# Patient Record
Sex: Female | Born: 1950 | Race: White | Hispanic: No | Marital: Married | Smoking: Never smoker
Health system: Southern US, Community
[De-identification: ages and names within clinical notes are randomized; demographics above are authoritative.]

## PROBLEM LIST (undated history)

## (undated) DIAGNOSIS — I2510 Atherosclerotic heart disease of native coronary artery without angina pectoris: Secondary | ICD-10-CM

## (undated) DIAGNOSIS — Z8673 Personal history of transient ischemic attack (TIA), and cerebral infarction without residual deficits: Secondary | ICD-10-CM

## (undated) DIAGNOSIS — Z9289 Personal history of other medical treatment: Secondary | ICD-10-CM

## (undated) DIAGNOSIS — I10 Essential (primary) hypertension: Secondary | ICD-10-CM

## (undated) DIAGNOSIS — I69359 Hemiplegia and hemiparesis following cerebral infarction affecting unspecified side: Secondary | ICD-10-CM

## (undated) DIAGNOSIS — E119 Type 2 diabetes mellitus without complications: Secondary | ICD-10-CM

## (undated) DIAGNOSIS — F32A Depression, unspecified: Secondary | ICD-10-CM

## (undated) DIAGNOSIS — M797 Fibromyalgia: Secondary | ICD-10-CM

## (undated) DIAGNOSIS — K589 Irritable bowel syndrome without diarrhea: Secondary | ICD-10-CM

## (undated) DIAGNOSIS — I69398 Other sequelae of cerebral infarction: Principal | ICD-10-CM

## (undated) DIAGNOSIS — M1990 Unspecified osteoarthritis, unspecified site: Secondary | ICD-10-CM

## (undated) DIAGNOSIS — F329 Major depressive disorder, single episode, unspecified: Secondary | ICD-10-CM

## (undated) DIAGNOSIS — E785 Hyperlipidemia, unspecified: Secondary | ICD-10-CM

## (undated) DIAGNOSIS — F419 Anxiety disorder, unspecified: Secondary | ICD-10-CM

## (undated) HISTORY — DX: Personal history of other medical treatment: Z92.89

## (undated) HISTORY — PX: OTHER SURGICAL HISTORY: SHX169

## (undated) HISTORY — PX: CORONARY STENT PLACEMENT: SHX1402

## (undated) HISTORY — DX: Depression, unspecified: F32.A

## (undated) HISTORY — PX: CHOLECYSTECTOMY: SHX55

## (undated) HISTORY — DX: Anxiety disorder, unspecified: F41.9

## (undated) HISTORY — DX: Hyperlipidemia, unspecified: E78.5

## (undated) HISTORY — DX: Hemiplegia and hemiparesis following cerebral infarction affecting unspecified side: I69.359

## (undated) HISTORY — DX: Personal history of transient ischemic attack (TIA), and cerebral infarction without residual deficits: Z86.73

## (undated) HISTORY — DX: Other sequelae of cerebral infarction: I69.398

## (undated) HISTORY — DX: Major depressive disorder, single episode, unspecified: F32.9

---

## 1993-11-09 DIAGNOSIS — M797 Fibromyalgia: Secondary | ICD-10-CM

## 1993-11-09 HISTORY — DX: Fibromyalgia: M79.7

## 1998-01-14 ENCOUNTER — Ambulatory Visit (HOSPITAL_COMMUNITY): Admission: RE | Admit: 1998-01-14 | Discharge: 1998-01-14 | Payer: Self-pay | Admitting: *Deleted

## 1999-01-06 ENCOUNTER — Other Ambulatory Visit: Admission: RE | Admit: 1999-01-06 | Discharge: 1999-01-06 | Payer: Self-pay | Admitting: *Deleted

## 2000-05-13 ENCOUNTER — Other Ambulatory Visit: Admission: RE | Admit: 2000-05-13 | Discharge: 2000-05-13 | Payer: Self-pay | Admitting: Obstetrics

## 2000-05-13 ENCOUNTER — Encounter: Admission: RE | Admit: 2000-05-13 | Discharge: 2000-05-13 | Payer: Self-pay | Admitting: Obstetrics

## 2001-12-12 ENCOUNTER — Encounter: Payer: Self-pay | Admitting: Cardiovascular Disease

## 2001-12-12 ENCOUNTER — Ambulatory Visit (HOSPITAL_COMMUNITY): Admission: RE | Admit: 2001-12-12 | Discharge: 2001-12-12 | Payer: Self-pay | Admitting: Cardiovascular Disease

## 2003-08-27 ENCOUNTER — Encounter: Admission: RE | Admit: 2003-08-27 | Discharge: 2003-11-25 | Payer: Self-pay | Admitting: Family Medicine

## 2005-06-17 ENCOUNTER — Encounter: Admission: RE | Admit: 2005-06-17 | Discharge: 2005-06-17 | Payer: Self-pay | Admitting: Internal Medicine

## 2007-06-02 ENCOUNTER — Emergency Department (HOSPITAL_COMMUNITY): Admission: EM | Admit: 2007-06-02 | Discharge: 2007-06-03 | Payer: Self-pay | Admitting: Emergency Medicine

## 2007-07-26 ENCOUNTER — Ambulatory Visit: Payer: Self-pay | Admitting: *Deleted

## 2007-07-26 ENCOUNTER — Ambulatory Visit: Payer: Self-pay | Admitting: Family Medicine

## 2007-07-26 LAB — CONVERTED CEMR LAB
ALT: 22 units/L (ref 0–35)
AST: 17 units/L (ref 0–37)
Albumin: 4.1 g/dL (ref 3.5–5.2)
BUN: 12 mg/dL (ref 6–23)
Basophils Absolute: 0 10*3/uL (ref 0.0–0.1)
Basophils Relative: 0 % (ref 0–1)
CO2: 29 meq/L (ref 19–32)
Calcium: 9.4 mg/dL (ref 8.4–10.5)
Chloride: 96 meq/L (ref 96–112)
Lymphocytes Relative: 25 % (ref 12–46)
MCHC: 33.1 g/dL (ref 30.0–36.0)
Monocytes Relative: 8 % (ref 3–11)
Neutro Abs: 7 10*3/uL (ref 1.7–7.7)
Neutrophils Relative %: 64 % (ref 43–77)
Potassium: 3.3 meq/L — ABNORMAL LOW (ref 3.5–5.3)
RBC: 4.63 M/uL (ref 3.87–5.11)

## 2007-08-09 ENCOUNTER — Ambulatory Visit (HOSPITAL_COMMUNITY): Admission: RE | Admit: 2007-08-09 | Discharge: 2007-08-09 | Payer: Self-pay | Admitting: Family Medicine

## 2007-11-18 ENCOUNTER — Ambulatory Visit: Payer: Self-pay | Admitting: Family Medicine

## 2007-12-13 ENCOUNTER — Ambulatory Visit: Payer: Self-pay | Admitting: Family Medicine

## 2007-12-13 LAB — CONVERTED CEMR LAB
BUN: 12 mg/dL (ref 6–23)
Calcium: 9.7 mg/dL (ref 8.4–10.5)
Glucose, Bld: 188 mg/dL — ABNORMAL HIGH (ref 70–99)
TSH: 5.88 microintl units/mL — ABNORMAL HIGH (ref 0.350–5.50)

## 2008-02-03 ENCOUNTER — Ambulatory Visit: Payer: Self-pay | Admitting: Internal Medicine

## 2008-02-03 ENCOUNTER — Encounter (INDEPENDENT_AMBULATORY_CARE_PROVIDER_SITE_OTHER): Payer: Self-pay | Admitting: Family Medicine

## 2008-02-03 LAB — CONVERTED CEMR LAB
BUN: 10 mg/dL (ref 6–23)
Calcium: 8.9 mg/dL (ref 8.4–10.5)
Cholesterol: 205 mg/dL — ABNORMAL HIGH (ref 0–200)
Creatinine, Ser: 0.79 mg/dL (ref 0.40–1.20)
Triglycerides: 174 mg/dL — ABNORMAL HIGH (ref <150)

## 2008-07-04 ENCOUNTER — Ambulatory Visit: Payer: Self-pay | Admitting: Family Medicine

## 2008-07-04 LAB — CONVERTED CEMR LAB
BUN: 12 mg/dL (ref 6–23)
CO2: 28 meq/L (ref 19–32)
Chloride: 96 meq/L (ref 96–112)
Creatinine, Ser: 0.84 mg/dL (ref 0.40–1.20)

## 2008-07-13 ENCOUNTER — Ambulatory Visit: Payer: Self-pay | Admitting: Family Medicine

## 2008-07-13 LAB — CONVERTED CEMR LAB
ALT: 29 units/L (ref 0–35)
BUN: 12 mg/dL (ref 6–23)
CO2: 26 meq/L (ref 19–32)
Calcium: 9.2 mg/dL (ref 8.4–10.5)
Chloride: 97 meq/L (ref 96–112)
Cholesterol: 243 mg/dL — ABNORMAL HIGH (ref 0–200)
Creatinine, Ser: 0.84 mg/dL (ref 0.40–1.20)
Total CHOL/HDL Ratio: 4.5

## 2008-07-23 ENCOUNTER — Ambulatory Visit (HOSPITAL_COMMUNITY): Admission: RE | Admit: 2008-07-23 | Discharge: 2008-07-23 | Payer: Self-pay | Admitting: Family Medicine

## 2008-11-16 ENCOUNTER — Ambulatory Visit: Payer: Self-pay | Admitting: Family Medicine

## 2008-12-25 ENCOUNTER — Ambulatory Visit: Payer: Self-pay | Admitting: Family Medicine

## 2009-05-08 ENCOUNTER — Ambulatory Visit: Payer: Self-pay | Admitting: Internal Medicine

## 2009-10-11 ENCOUNTER — Emergency Department (HOSPITAL_COMMUNITY): Admission: EM | Admit: 2009-10-11 | Discharge: 2009-10-11 | Payer: Self-pay | Admitting: Emergency Medicine

## 2009-10-17 ENCOUNTER — Ambulatory Visit: Payer: Self-pay | Admitting: Family Medicine

## 2009-10-17 LAB — CONVERTED CEMR LAB: Microalb, Ur: 0.76 mg/dL (ref 0.00–1.89)

## 2009-10-29 ENCOUNTER — Ambulatory Visit: Payer: Self-pay | Admitting: Internal Medicine

## 2009-12-13 ENCOUNTER — Ambulatory Visit: Payer: Self-pay | Admitting: Family Medicine

## 2009-12-13 LAB — CONVERTED CEMR LAB
ALT: 19 units/L (ref 0–35)
AST: 19 units/L (ref 0–37)
CO2: 27 meq/L (ref 19–32)
Calcium: 9.3 mg/dL (ref 8.4–10.5)
Chloride: 100 meq/L (ref 96–112)
Cholesterol: 217 mg/dL — ABNORMAL HIGH (ref 0–200)
Hemoglobin: 14 g/dL (ref 12.0–15.0)
Lymphocytes Relative: 27 % (ref 12–46)
Lymphs Abs: 2.3 10*3/uL (ref 0.7–4.0)
MCHC: 32.9 g/dL (ref 30.0–36.0)
Monocytes Absolute: 0.7 10*3/uL (ref 0.1–1.0)
Monocytes Relative: 8 % (ref 3–12)
Neutro Abs: 5.2 10*3/uL (ref 1.7–7.7)
Potassium: 3.9 meq/L (ref 3.5–5.3)
RBC: 4.87 M/uL (ref 3.87–5.11)
Sed Rate: 15 mm/hr (ref 0–22)
Sodium: 140 meq/L (ref 135–145)
TSH: 5.082 microintl units/mL — ABNORMAL HIGH (ref 0.350–4.500)
Total Protein: 6.6 g/dL (ref 6.0–8.3)
Vit D, 25-Hydroxy: 17 ng/mL — ABNORMAL LOW (ref 30–89)
WBC: 8.4 10*3/uL (ref 4.0–10.5)

## 2010-01-13 ENCOUNTER — Ambulatory Visit: Payer: Self-pay | Admitting: Family Medicine

## 2010-01-13 ENCOUNTER — Encounter (INDEPENDENT_AMBULATORY_CARE_PROVIDER_SITE_OTHER): Payer: Self-pay | Admitting: Internal Medicine

## 2010-01-13 LAB — CONVERTED CEMR LAB
CO2: 30 meq/L (ref 19–32)
Glucose, Bld: 150 mg/dL — ABNORMAL HIGH (ref 70–99)
Hgb A1c MFr Bld: 7.7 % — ABNORMAL HIGH (ref 4.6–6.1)
Potassium: 3.9 meq/L (ref 3.5–5.3)
Sodium: 138 meq/L (ref 135–145)

## 2010-02-03 ENCOUNTER — Ambulatory Visit (HOSPITAL_COMMUNITY): Admission: RE | Admit: 2010-02-03 | Discharge: 2010-02-03 | Payer: Self-pay | Admitting: Cardiology

## 2010-03-17 ENCOUNTER — Inpatient Hospital Stay (HOSPITAL_COMMUNITY): Admission: RE | Admit: 2010-03-17 | Discharge: 2010-03-18 | Payer: Self-pay | Admitting: Cardiology

## 2010-09-25 ENCOUNTER — Encounter (INDEPENDENT_AMBULATORY_CARE_PROVIDER_SITE_OTHER): Payer: Self-pay | Admitting: Family Medicine

## 2010-09-25 LAB — CONVERTED CEMR LAB
Cholesterol: 168 mg/dL (ref 0–200)
HDL: 44 mg/dL (ref >39)
LDL Cholesterol: 89 mg/dL (ref 0–99)
Triglycerides: 177 mg/dL — ABNORMAL HIGH (ref <150)
VLDL: 35 mg/dL (ref 0–40)

## 2010-11-29 ENCOUNTER — Encounter: Payer: Self-pay | Admitting: Obstetrics and Gynecology

## 2010-11-30 ENCOUNTER — Encounter: Payer: Self-pay | Admitting: Family Medicine

## 2011-01-27 LAB — BASIC METABOLIC PANEL
BUN: 12 mg/dL (ref 6–23)
BUN: 14 mg/dL (ref 6–23)
CO2: 34 mEq/L — ABNORMAL HIGH (ref 19–32)
Chloride: 97 mEq/L (ref 96–112)
GFR calc non Af Amer: >60 mL/min (ref >60)
GFR calc non Af Amer: >60 mL/min (ref >60)
Glucose, Bld: 187 mg/dL — ABNORMAL HIGH (ref 70–99)
Potassium: 3.2 mEq/L — ABNORMAL LOW (ref 3.5–5.1)
Potassium: 3.4 mEq/L — ABNORMAL LOW (ref 3.5–5.1)
Sodium: 136 mEq/L (ref 135–145)
Sodium: 146 mEq/L — ABNORMAL HIGH (ref 135–145)

## 2011-01-27 LAB — LIPID PANEL
HDL: 46 mg/dL (ref >39)
Total CHOL/HDL Ratio: 3.3 RATIO
Triglycerides: 176 mg/dL — ABNORMAL HIGH (ref <150)
VLDL: 35 mg/dL (ref 0–40)

## 2011-01-27 LAB — GLUCOSE, CAPILLARY
Glucose-Capillary: 170 mg/dL — ABNORMAL HIGH (ref 70–99)
Glucose-Capillary: 193 mg/dL — ABNORMAL HIGH (ref 70–99)

## 2011-01-27 LAB — CBC
HCT: 36.6 % (ref 36.0–46.0)
Hemoglobin: 12.7 g/dL (ref 12.0–15.0)
RBC: 4.19 MIL/uL (ref 3.87–5.11)
WBC: 8.1 10*3/uL (ref 4.0–10.5)

## 2011-01-27 LAB — ALT: ALT: 34 U/L (ref 0–35)

## 2011-08-24 LAB — POCT CARDIAC MARKERS
CKMB, poc: 3.5
Myoglobin, poc: 95

## 2011-08-24 LAB — I-STAT 8, (EC8 V) (CONVERTED LAB)
Chloride: 100
Glucose, Bld: 186 — ABNORMAL HIGH
Hemoglobin: 14.6
Potassium: 2.7 — CL
Sodium: 137
TCO2: 30
pH, Ven: 7.344 — ABNORMAL HIGH

## 2011-08-24 LAB — POCT I-STAT CREATININE: Operator id: 282201

## 2012-12-02 ENCOUNTER — Encounter (HOSPITAL_COMMUNITY): Payer: Self-pay | Admitting: *Deleted

## 2012-12-02 ENCOUNTER — Emergency Department (HOSPITAL_COMMUNITY): Payer: Self-pay

## 2012-12-02 ENCOUNTER — Emergency Department (HOSPITAL_COMMUNITY)
Admission: EM | Admit: 2012-12-02 | Discharge: 2012-12-02 | Disposition: A | Discharge status: Home / Self Care | Payer: Self-pay | Attending: Emergency Medicine | Admitting: Emergency Medicine

## 2012-12-02 DIAGNOSIS — S199XXA Unspecified injury of neck, initial encounter: Secondary | ICD-10-CM | POA: Insufficient documentation

## 2012-12-02 DIAGNOSIS — S0100XA Unspecified open wound of scalp, initial encounter: Secondary | ICD-10-CM | POA: Insufficient documentation

## 2012-12-02 DIAGNOSIS — W19XXXA Unspecified fall, initial encounter: Secondary | ICD-10-CM

## 2012-12-02 DIAGNOSIS — I10 Essential (primary) hypertension: Secondary | ICD-10-CM | POA: Insufficient documentation

## 2012-12-02 DIAGNOSIS — M129 Arthropathy, unspecified: Secondary | ICD-10-CM | POA: Insufficient documentation

## 2012-12-02 DIAGNOSIS — R51 Headache: Secondary | ICD-10-CM

## 2012-12-02 DIAGNOSIS — Z79899 Other long term (current) drug therapy: Secondary | ICD-10-CM | POA: Insufficient documentation

## 2012-12-02 DIAGNOSIS — IMO0001 Reserved for inherently not codable concepts without codable children: Secondary | ICD-10-CM | POA: Insufficient documentation

## 2012-12-02 DIAGNOSIS — W1809XA Striking against other object with subsequent fall, initial encounter: Secondary | ICD-10-CM | POA: Insufficient documentation

## 2012-12-02 DIAGNOSIS — Y929 Unspecified place or not applicable: Secondary | ICD-10-CM | POA: Insufficient documentation

## 2012-12-02 DIAGNOSIS — M542 Cervicalgia: Secondary | ICD-10-CM

## 2012-12-02 DIAGNOSIS — Z7982 Long term (current) use of aspirin: Secondary | ICD-10-CM | POA: Insufficient documentation

## 2012-12-02 DIAGNOSIS — I2510 Atherosclerotic heart disease of native coronary artery without angina pectoris: Secondary | ICD-10-CM | POA: Insufficient documentation

## 2012-12-02 DIAGNOSIS — S0990XA Unspecified injury of head, initial encounter: Secondary | ICD-10-CM | POA: Insufficient documentation

## 2012-12-02 DIAGNOSIS — E119 Type 2 diabetes mellitus without complications: Secondary | ICD-10-CM | POA: Insufficient documentation

## 2012-12-02 DIAGNOSIS — Z8719 Personal history of other diseases of the digestive system: Secondary | ICD-10-CM | POA: Insufficient documentation

## 2012-12-02 DIAGNOSIS — S0993XA Unspecified injury of face, initial encounter: Secondary | ICD-10-CM | POA: Insufficient documentation

## 2012-12-02 DIAGNOSIS — S0101XA Laceration without foreign body of scalp, initial encounter: Secondary | ICD-10-CM

## 2012-12-02 DIAGNOSIS — R42 Dizziness and giddiness: Secondary | ICD-10-CM | POA: Insufficient documentation

## 2012-12-02 DIAGNOSIS — Y9389 Activity, other specified: Secondary | ICD-10-CM | POA: Insufficient documentation

## 2012-12-02 HISTORY — DX: Atherosclerotic heart disease of native coronary artery without angina pectoris: I25.10

## 2012-12-02 HISTORY — DX: Type 2 diabetes mellitus without complications: E11.9

## 2012-12-02 HISTORY — DX: Unspecified osteoarthritis, unspecified site: M19.90

## 2012-12-02 HISTORY — DX: Essential (primary) hypertension: I10

## 2012-12-02 HISTORY — DX: Irritable bowel syndrome, unspecified: K58.9

## 2012-12-02 HISTORY — DX: Fibromyalgia: M79.7

## 2012-12-02 MED ORDER — ONDANSETRON 4 MG PO TBDP
4.0000 mg | ORAL_TABLET | Freq: Once | ORAL | Status: AC
  Administered 2012-12-02: 4 mg via ORAL
  Filled 2012-12-02: qty 1

## 2012-12-02 MED ORDER — HYDROCODONE-ACETAMINOPHEN 5-325 MG PO TABS
2.0000 | ORAL_TABLET | Freq: Once | ORAL | Status: AC
  Administered 2012-12-02: 2 via ORAL
  Filled 2012-12-02: qty 2

## 2012-12-02 MED ORDER — BACITRACIN ZINC 500 UNIT/GM EX OINT
TOPICAL_OINTMENT | CUTANEOUS | Status: AC
  Administered 2012-12-02: 1
  Filled 2012-12-02: qty 4.5

## 2012-12-02 MED ORDER — HYDROCODONE-ACETAMINOPHEN 5-325 MG PO TABS: 1.0000 | ORAL_TABLET | Freq: Three times a day (TID) | ORAL | Status: DC | PRN

## 2012-12-02 NOTE — ED Provider Notes (Signed)
History     CSN: [id number]  Arrival date & time 12/02/12  0226   First MD Initiated Contact with Patient 12/02/12 [phone]      Chief Complaint  Patient presents with  . Fall  . Head Laceration    (Consider location/radiation/quality/duration/timing/severity/associated sxs/prior treatment) HPIEsther Aura [name] is a 62 y.o. female was reaching up this evening to turn on a ceiling fan, she stood up on her tiptoes while standing on a pillow and then fell backwards and hit her head on an entertainment center. She's complaining chiefly of lower back pain, some neck pain, and headache. There is some bleeding she think she scraped her head. She denies any loss of consciousness, no numbness, no tingling, no weakness, no chest pain, no shortness of breath.  Bleeding is hemostatic at this time. C-collar was applied by nursing staff in triage.  Patient says she did feel a little bit dizzy before she fell, however the patient does take Ambien, she says is sometimes Ambien makes her dizzy as she was earlier this evening and she did take an Ambien.   Past Medical History  Diagnosis Date  . Hypertension   . Coronary artery disease   . Diabetes mellitus without complication   . Arthritis   . Fibromyalgia   . Irritable bowel     Past Surgical History  Procedure Date  . Carotid stent   . Cholecystectomy     No family history on file.  History  Substance Use Topics  . Smoking status: Never Smoker   . Smokeless tobacco: Not on file  . Alcohol Use: No    OB History    Grav Para Term Preterm Abortions TAB SAB Ect Mult Living                  Review of Systems At least 10pt or greater review of systems completed and are negative except where specified in the HPI.  Allergies  Sulfa antibiotics  Home Medications   Current Outpatient Rx  Name  Route  Sig  Dispense  Refill  . ACETAMINOPHEN 500 MG PO TABS   Oral   Take 500 mg by mouth every 6 (six) hours as needed. For pain         . ALPHA LIPOIC ACID PO   Oral   Take 1 tablet by mouth every morning.         Marland Kitchen AMLODIPINE BESYLATE PO   Oral   Take 1 tablet by mouth every morning.         . ASPIRIN EC 81 MG PO TBEC   Oral   Take 81 mg by mouth every morning.         . ATENOLOL 25 MG PO TABS   Oral   Take 25 mg by mouth every morning.         . B COMPLEX-FOLIC ACID PO   Oral   Take 1 tablet by mouth every morning.         Marland Kitchen VITAMIN D-3 PO   Oral   Take 1 capsule by mouth every morning.         Marland Kitchen CLOPIDOGREL BISULFATE 75 MG PO TABS   Oral   Take 75 mg by mouth every evening.         Marland Kitchen GLIMEPIRIDE PO   Oral   Take 1 tablet by mouth every morning.         Marland Kitchen HYDROXYZINE HCL 10 MG PO TABS   Oral  Take 10 mg by mouth 3 (three) times daily as needed. For itching         . IBUPROFEN 200 MG PO TABS   Oral   Take 400 mg by mouth every 6 (six) hours as needed. For pain         . NAPROXEN SODIUM 220 MG PO TABS   Oral   Take 220 mg by mouth daily as needed. For pain         . NITROGLYCERIN ER PO   Oral   Take 1 capsule by mouth daily as needed. For chest pain         . PRAVASTATIN SODIUM 20 MG PO TABS   Oral   Take 20 mg by mouth every evening.         Marland Kitchen SPIRONOLACTONE 25 MG PO TABS   Oral   Take 25 mg by mouth every morning.         Marland Kitchen VITAMIN C 500 MG PO TABS   Oral   Take 500 mg by mouth every morning.         Marland Kitchen VITAMIN E 400 UNITS PO CAPS   Oral   Take 400 Units by mouth every morning.         Marland Kitchen ZOLPIDEM TARTRATE 10 MG PO TABS   Oral   Take 10 mg by mouth at bedtime as needed. For neuropathy         . HYDROCODONE-ACETAMINOPHEN 5-325 MG PO TABS   Oral   Take 1-2 tablets by mouth every 8 (eight) hours as needed for pain.   17 tablet   0     BP 186/113  Pulse 107  Temp 98.2 F (36.8 C)  Resp 20  SpO2 100%  Physical Exam  Nursing notes reviewed.  Electronic medical record reviewed. VITAL SIGNS:   Filed Vitals:   12/02/12 0238  BP:  186/113  Pulse: 107  Temp: 98.2 F (36.8 C)  Resp: 20  SpO2: 100%   CONSTITUTIONAL: Awake, oriented, appears non-toxic HENT: 2 cm laceration to the right posterior occiput - does not completely penetrate the dermis, small amount swelling underlying the laceration. normocephalic, oral mucosa pink and moist, airway patent. Nares patent without drainage. External ears normal. EYES: Conjunctiva clear, EOMI, PERRLA NECK: Trachea midline, non-tender, supple, mild cervical paraspinous muscle tenderness CARDIOVASCULAR: Normal heart rate, Normal rhythm, No murmurs, rubs, gallops PULMONARY/CHEST: Clear to auscultation, no rhonchi, wheezes, or rales. Symmetrical breath sounds. Non-tender. ABDOMINAL: Non-distended, soft, non-tender - no rebound or guarding.  BS normal. Back: Mild tenderness in the paraspinous muscles in the back diffusely NEUROLOGIC: Non-focal, moving all four extremities, no gross motor deficits. Patient has chronic diabetic neuropathy to the bottom of the feet and reduced sensation-nothing new, no increase in loss of sensation. Patellar reflexes 1+ bilaterally, no clonus EXTREMITIES: No clubbing, cyanosis, or edema SKIN: Warm, Dry, No erythema, No rash  ED Course  LACERATION REPAIR Date/Time: 12/02/2012 7:38 AM Performed by: Jones Skene Authorized by: Jones Skene Consent: Verbal consent obtained. Consent given by: patient Patient identity confirmed: verbally with patient Body area: head/neck Location details: scalp Laceration length: 2.5 cm Tendon involvement: none Nerve involvement: none Vascular damage: no Anesthesia: local infiltration Local anesthetic: lidocaine 1% with epinephrine Anesthetic total: 4 ml Patient sedated: no Preparation: Patient was prepped and draped in the usual sterile fashion. Irrigation solution: saline Irrigation method: syringe Amount of cleaning: extensive (120 cc water through 60 cc syringe using a splash guard) Wound skin closure  material used: 3-0 plain gut. Number of sutures: 4 Approximation: close Approximation difficulty: simple Dressing: antibiotic ointment Patient tolerance: Patient tolerated the procedure well with no immediate complications.   (including critical care time)  Labs Reviewed - No data to display Dg Lumbar Spine Complete  12/02/2012  *RADIOLOGY REPORT*  Clinical Data: Status post fall off stool; lower back pain and stiffness.  LUMBAR SPINE - COMPLETE 4+ VIEW  Comparison: Lumbar spine radiographs performed 08/09/2007  Findings: There is no evidence of fracture or subluxation. Vertebral bodies demonstrate normal height and alignment.  Mild intervertebral disc space narrowing is noted at L5-S1, and multilevel vacuum phenomenon is noted along the lumbar spine.  The visualized bowel gas pattern is unremarkable in appearance; air and stool are noted within the colon.  The sacroiliac joints are within normal limits.  Clips are noted within the right upper quadrant, reflecting prior cholecystectomy.  IMPRESSION:  1.  No evidence of fracture or subluxation along the lumbar spine. 2.  Minimal degenerative change noted along the lumbar spine.   Original Report Authenticated By: Tonia Ghent, M.D.    Dg Pelvis 1-2 Views  12/02/2012  *RADIOLOGY REPORT*  Clinical Data: Larey Seat off stool; lower back pain and stiffness.  Hip pain.  PELVIS - 1-2 VIEW  Comparison: Pelvis radiograph performed 08/09/2007  Findings: There is no evidence of fracture or dislocation.  Both femoral heads are seated normally within their respective acetabula.  Mild subcortical cysts are suggested at the right acetabulum.  The sacroiliac joints are unremarkable in appearance.  The visualized bowel gas pattern is grossly unremarkable in appearance.  IMPRESSION: No evidence of fracture or dislocation; suggestion of mild degenerative change at the right hip.   Original Report Authenticated By: Tonia Ghent, M.D.    Ct Head Wo Contrast  12/02/2012   *RADIOLOGY REPORT*  Clinical Data:  Status post fall, with neck pain radiating to the left shoulder, and posterior head laceration.  CT HEAD WITHOUT CONTRAST AND CT CERVICAL SPINE WITHOUT CONTRAST  Technique:  Multidetector CT imaging of the head and cervical spine was performed following the standard protocol without intravenous contrast.  Multiplanar CT image reconstructions of the cervical spine were also generated.  Comparison: None  CT HEAD  Findings: There is no evidence of acute infarction, mass lesion, or intra- or extra-axial hemorrhage on CT.  The posterior fossa, including the cerebellum, brainstem and fourth ventricle, is within normal limits.  The third and lateral ventricles, and basal ganglia are unremarkable in appearance.  The cerebral hemispheres are symmetric in appearance, with normal gray- white differentiation.  No mass effect or midline shift is seen.  There is no evidence of fracture; visualized osseous structures are unremarkable in appearance.  The orbits are within normal limits. The paranasal sinuses and mastoid air cells are well-aerated.  A soft tissue laceration is noted overlying the right occipital calvarium.  IMPRESSION:  1.  No evidence of traumatic intracranial injury or fracture. 2.  Soft tissue laceration noted overlying the right occipital calvarium.  CT CERVICAL SPINE  Findings: There is no evidence of fracture or subluxation. Vertebral bodies demonstrate normal height and alignment.  There is chronic osseous fusion at C3-C4, and disc space narrowing is noted at C4-C5 and C5-C6, with associated anterior and posterior disc osteophyte complexes.  Prevertebral soft tissues are within normal limits.  There is incomplete fusion of the posterior arch of C2.  The thyroid gland is unremarkable in appearance.  The visualized lung apices are clear.  Retropharyngeal internal  carotid arteries are noted bilaterally; there is mild calcification along the right internal carotid artery, and  at the left carotid bifurcation.  IMPRESSION:  1.  No evidence of fracture or subluxation along the cervical spine.  2.  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 3.  Retropharyngeal internal carotid arteries bilaterally; mild calcification along the right internal carotid artery, and at the left carotid bifurcation.   Original Report Authenticated By: Tonia Ghent, M.D.    Ct Cervical Spine Wo Contrast  12/02/2012  *RADIOLOGY REPORT*  Clinical Data:  Status post fall, with neck pain radiating to the left shoulder, and posterior head laceration.  CT HEAD WITHOUT CONTRAST AND CT CERVICAL SPINE WITHOUT CONTRAST  Technique:  Multidetector CT imaging of the head and cervical spine was performed following the standard protocol without intravenous contrast.  Multiplanar CT image reconstructions of the cervical spine were also generated.  Comparison: None  CT HEAD  Findings: There is no evidence of acute infarction, mass lesion, or intra- or extra-axial hemorrhage on CT.  The posterior fossa, including the cerebellum, brainstem and fourth ventricle, is within normal limits.  The third and lateral ventricles, and basal ganglia are unremarkable in appearance.  The cerebral hemispheres are symmetric in appearance, with normal gray- white differentiation.  No mass effect or midline shift is seen.  There is no evidence of fracture; visualized osseous structures are unremarkable in appearance.  The orbits are within normal limits. The paranasal sinuses and mastoid air cells are well-aerated.  A soft tissue laceration is noted overlying the right occipital calvarium.  IMPRESSION:  1.  No evidence of traumatic intracranial injury or fracture. 2.  Soft tissue laceration noted overlying the right occipital calvarium.  CT CERVICAL SPINE  Findings: There is no evidence of fracture or subluxation. Vertebral bodies demonstrate normal height and alignment.  There is chronic osseous  fusion at C3-C4, and disc space narrowing is noted at C4-C5 and C5-C6, with associated anterior and posterior disc osteophyte complexes.  Prevertebral soft tissues are within normal limits.  There is incomplete fusion of the posterior arch of C2.  The thyroid gland is unremarkable in appearance.  The visualized lung apices are clear.  Retropharyngeal internal carotid arteries are noted bilaterally; there is mild calcification along the right internal carotid artery, and at the left carotid bifurcation.  IMPRESSION:  1.  No evidence of fracture or subluxation along the cervical spine.  2.  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 3.  Retropharyngeal internal carotid arteries bilaterally; mild calcification along the right internal carotid artery, and at the left carotid bifurcation.   Original Report Authenticated By: Tonia Ghent, M.D.      1. Fall   2. Scalp laceration   3. Neck pain   4. Headache       MDM  Anaih Brander is a 62 y.o. female setting status post fall after falling backwards, she may be a little bit dizzy from Ambien, there is no other associated symptoms at that time such as shortness of breath or chest pain-I do not think this was an arrhythmia causing instability, she was standing to go on a soft pillow causing unstable footing with diabetic neuropathy. Patient has a small laceration, no loss of consciousness. Patient is on aspirin daily.  CT of the head and neck are within normal limits, there is a small soft tissue laceration noted on the head CT as on physical  exam. No cervical fracture.  Lumbar spine x-ray series show some chronic degenerative changes. Patient is feeling much better after medication, her headache is gone. Repair scalp laceration with 3-0 plain gut - she was concerned about sleeping with staples.  I explained the diagnosis and have given explicit precautions to return to the ER including symptoms  consistent with infection including redness, swelling, worsening pain at the scalp any other new or worsening symptoms. The patient understands and accepts the medical plan as it's been dictated and I have answered their questions. Discharge instructions concerning home care and prescriptions have been given.  The patient is STABLE and is discharged to home in good condition.          Jones Skene, MD 12/02/12 [phone]

## 2012-12-02 NOTE — ED Notes (Signed)
Pt states was reaching up to turn on a ceiling fan; stood on a pillow; fell back and hit head on entertainment center; presents with lower back pain; neck pain and small laceration to back of head; laceration noted with swelling with bleeding controlling; c collar applied in triage; neg loc

## 2012-12-02 NOTE — ED Notes (Signed)
Pt to radiology at this time.

## 2013-02-10 ENCOUNTER — Encounter (HOSPITAL_COMMUNITY): Payer: Self-pay

## 2013-02-10 ENCOUNTER — Emergency Department (HOSPITAL_COMMUNITY)
Admission: EM | Admit: 2013-02-10 | Discharge: 2013-02-10 | Disposition: A | Discharge status: Home / Self Care | Payer: Self-pay | Source: Home / Self Care

## 2013-02-10 DIAGNOSIS — I10 Essential (primary) hypertension: Secondary | ICD-10-CM

## 2013-02-10 LAB — TSH: TSH: 7.775 u[IU]/mL — ABNORMAL HIGH (ref 0.350–4.500)

## 2013-02-10 MED ORDER — AMLODIPINE BESYLATE 10 MG PO TABS: 10.0000 mg | ORAL_TABLET | Freq: Every day | ORAL | Status: DC

## 2013-02-10 MED ORDER — CLONIDINE HCL 0.1 MG PO TABS
ORAL_TABLET | ORAL | Status: AC
  Filled 2013-02-10: qty 2

## 2013-02-10 MED ORDER — FREESTYLE SYSTEM KIT: 1.0000 | PACK | Freq: Three times a day (TID) | Status: DC

## 2013-02-10 MED ORDER — ATENOLOL 25 MG PO TABS: 25.0000 mg | ORAL_TABLET | Freq: Every morning | ORAL | Status: DC

## 2013-02-10 MED ORDER — CLONIDINE HCL 0.1 MG PO TABS
0.2000 mg | ORAL_TABLET | ORAL | Status: AC
  Administered 2013-02-10: 0.2 mg via ORAL

## 2013-02-10 MED ORDER — CLONIDINE HCL 0.1 MG PO TABS: 0.1000 mg | ORAL_TABLET | Freq: Two times a day (BID) | ORAL | Status: DC

## 2013-02-10 MED ORDER — GLIMEPIRIDE 2 MG PO TABS: 2.0000 mg | ORAL_TABLET | Freq: Every day | ORAL | Status: DC

## 2013-02-10 NOTE — ED Notes (Signed)
Patient has history of DM and HTN Needs medication refills

## 2013-02-10 NOTE — ED Notes (Signed)
Patient Demographics  Shannon [name], is a 62 y.o. female  ZOX:[id number]  WJX:[id number]  DOB - 1951/03/10  Chief Complaint  Patient presents with  . Diabetes        Subjective:   [name]  with history of CAD post and follows with Dr. Chales Abrahams, type 2 diabetes mellitus and hypertension here to establish care, she has run out of her blood pressure and diabetic medications she used to take Amaryl 2 mg daily, no chest abdominal pain no shortness of breath no headaches.     Objective:   Past Medical History  Diagnosis Date  . Hypertension   . Coronary artery disease   . Diabetes mellitus without complication   . Arthritis   . Fibromyalgia   . Irritable bowel       Past Surgical History  Procedure Laterality Date  . Carotid stent    . Cholecystectomy       Filed Vitals:   02/10/13 1149  BP: 227/95  Pulse: 75  Temp: 97.7 F (36.5 C)  TempSrc: Oral  SpO2: 100%     Exam  Awake Alert, Oriented X 3, No new F.N deficits, Normal affect [place].AT,PERRAL Supple Neck,No JVD, No cervical lymphadenopathy appriciated.  Symmetrical Chest wall movement, Good air movement bilaterally, CTAB RRR,No Gallops,Rubs or new Murmurs, No Parasternal Heave +ve B.Sounds, Abd Soft, Non tender, No organomegaly appriciated, No rebound - guarding or rigidity. No Cyanosis, Clubbing or edema, No new Rash or bruise       Data Review   CBC No results found for this basename: WBC, HGB, HCT, PLT, MCV, MCH, MCHC, RDW, NEUTRABS, LYMPHSABS, MONOABS, EOSABS, BASOSABS, BANDABS, BANDSABD,  in the last 168 hours  Chemistries   No results found for this basename: NA, K, CL, CO2, GLUCOSE, BUN, CREATININE, GFRCGP, CALCIUM, MG, AST, ALT, ALKPHOS, BILITOT,  in the last 168 hours ------------------------------------------------------------------------------------------------------------------ No results found for this basename: HGBA1C,  in the last 72  hours ------------------------------------------------------------------------------------------------------------------ No results found for this basename: CHOL, HDL, LDLCALC, TRIG, CHOLHDL, LDLDIRECT,  in the last 72 hours ------------------------------------------------------------------------------------------------------------------ No results found for this basename: TSH, T4TOTAL, FREET3, T3FREE, THYROIDAB,  in the last 72 hours ------------------------------------------------------------------------------------------------------------------ No results found for this basename: VITAMINB12, FOLATE, FERRITIN, TIBC, IRON, RETICCTPCT,  in the last 72 hours  Coagulation profile  No results found for this basename: INR, PROTIME,  in the last 168 hours     Prior to Admission medications   Medication Sig Start Date End Date Taking? Authorizing Provider  acetaminophen (TYLENOL) 500 MG tablet Take 500 mg by mouth every 6 (six) hours as needed. For pain    Historical Provider, MD  ALPHA LIPOIC ACID PO Take 1 tablet by mouth every morning.    Historical Provider, MD  amLODipine (NORVASC) 10 MG tablet Take 1 tablet (10 mg total) by mouth daily. 02/10/13   Leroy Sea, MD  aspirin EC 81 MG tablet Take 81 mg by mouth every morning.    Historical Provider, MD  atenolol (TENORMIN) 25 MG tablet Take 1 tablet (25 mg total) by mouth every morning. 02/10/13   Leroy Sea, MD  B COMPLEX-FOLIC ACID PO Take 1 tablet by mouth every morning.    Historical Provider, MD  Cholecalciferol (VITAMIN D-3 PO) Take 1 capsule by mouth every morning.    Historical Provider, MD  cloNIDine (CATAPRES) 0.1 MG tablet Take 1 tablet (0.1 mg total) by mouth 2 (two) times daily. 02/10/13   Leroy Sea, MD  clopidogrel (PLAVIX) 75  MG tablet Take 75 mg by mouth every evening.    Historical Provider, MD  glimepiride (AMARYL) 2 MG tablet Take 1 tablet (2 mg total) by mouth daily before breakfast. 02/10/13   Leroy Sea, MD   glucose monitoring kit (FREESTYLE) monitoring kit 1 each by Does not apply route 4 (four) times daily - after meals and at bedtime. 1 month Diabetic Testing Supplies for QAC-QHS accuchecks. 02/10/13   Leroy Sea, MD  HYDROcodone-acetaminophen (NORCO/VICODIN) 5-325 MG per tablet Take 1-2 tablets by mouth every 8 (eight) hours as needed for pain. 12/02/12   John-Adam Bonk, MD  hydrOXYzine (ATARAX/VISTARIL) 10 MG tablet Take 10 mg by mouth 3 (three) times daily as needed. For itching    Historical Provider, MD  NITROGLYCERIN ER PO Take 1 capsule by mouth daily as needed. For chest pain    Historical Provider, MD  pravastatin (PRAVACHOL) 20 MG tablet Take 20 mg by mouth every evening.    Historical Provider, MD  spironolactone (ALDACTONE) 25 MG tablet Take 25 mg by mouth every morning.    Historical Provider, MD  vitamin C (ASCORBIC ACID) 500 MG tablet Take 500 mg by mouth every morning.    Historical Provider, MD  vitamin E (VITAMIN E) 400 UNIT capsule Take 400 Units by mouth every morning.    Historical Provider, MD  zolpidem (AMBIEN) 10 MG tablet Take 10 mg by mouth at bedtime as needed. For neuropathy    Historical Provider, MD     Assessment & Plan   HTN poor control. Catapres 0.2 mg given here, however patient present is missing from the room, blood pressure medications have been adjusted.   Allergies mellitus out of for 2 mg daily Amaryl, represcribed, glucometer and q. a.c. at bedtime instructions given. A1c was ordered for patient again is missing from the room.    Follow-up Information   Schedule an appointment as soon as possible for a visit in 2 days to follow up.       Leroy Sea M.D on 02/10/2013 at 12:06 PM   Leroy Sea, MD 02/10/13 [phone]

## 2013-04-21 NOTE — Progress Notes (Signed)
Quick Note:  Please have patient come back for repeat TSH, free t3 and T4. ______

## 2013-04-27 ENCOUNTER — Telehealth: Payer: Self-pay | Admitting: *Deleted

## 2013-04-27 NOTE — Telephone Encounter (Signed)
04/27/13 Spoke with patient and made aware of need to come back and  Have a repeat  THS, free t3 and 4. Patient stated she does not wish to schedule An appointment at this time. P.Yarlin Breisch,RN BSN [facility]

## 2013-07-01 ENCOUNTER — Inpatient Hospital Stay (HOSPITAL_COMMUNITY)
Admission: EM | Admit: 2013-07-01 | Discharge: 2013-07-02 | DRG: 313 | Disposition: A | Discharge status: Home / Self Care | Payer: Medicaid Other | Attending: Internal Medicine | Admitting: Internal Medicine

## 2013-07-01 ENCOUNTER — Encounter (HOSPITAL_COMMUNITY): Payer: Self-pay | Admitting: *Deleted

## 2013-07-01 ENCOUNTER — Emergency Department (HOSPITAL_COMMUNITY): Payer: Medicaid Other

## 2013-07-01 DIAGNOSIS — R0600 Dyspnea, unspecified: Secondary | ICD-10-CM

## 2013-07-01 DIAGNOSIS — E785 Hyperlipidemia, unspecified: Secondary | ICD-10-CM | POA: Diagnosis present

## 2013-07-01 DIAGNOSIS — R079 Chest pain, unspecified: Secondary | ICD-10-CM

## 2013-07-01 DIAGNOSIS — I739 Peripheral vascular disease, unspecified: Secondary | ICD-10-CM | POA: Diagnosis present

## 2013-07-01 DIAGNOSIS — I519 Heart disease, unspecified: Secondary | ICD-10-CM | POA: Diagnosis present

## 2013-07-01 DIAGNOSIS — M797 Fibromyalgia: Secondary | ICD-10-CM | POA: Diagnosis present

## 2013-07-01 DIAGNOSIS — Z598 Other problems related to housing and economic circumstances: Secondary | ICD-10-CM

## 2013-07-01 DIAGNOSIS — Z8249 Family history of ischemic heart disease and other diseases of the circulatory system: Secondary | ICD-10-CM

## 2013-07-01 DIAGNOSIS — Z833 Family history of diabetes mellitus: Secondary | ICD-10-CM

## 2013-07-01 DIAGNOSIS — Z7982 Long term (current) use of aspirin: Secondary | ICD-10-CM

## 2013-07-01 DIAGNOSIS — Z882 Allergy status to sulfonamides status: Secondary | ICD-10-CM

## 2013-07-01 DIAGNOSIS — E669 Obesity, unspecified: Secondary | ICD-10-CM | POA: Diagnosis present

## 2013-07-01 DIAGNOSIS — Z6826 Body mass index (BMI) 26.0-26.9, adult: Secondary | ICD-10-CM

## 2013-07-01 DIAGNOSIS — M129 Arthropathy, unspecified: Secondary | ICD-10-CM | POA: Diagnosis present

## 2013-07-01 DIAGNOSIS — Z5987 Material hardship due to limited financial resources, not elsewhere classified: Secondary | ICD-10-CM

## 2013-07-01 DIAGNOSIS — R0789 Other chest pain: Principal | ICD-10-CM | POA: Diagnosis present

## 2013-07-01 DIAGNOSIS — IMO0001 Reserved for inherently not codable concepts without codable children: Secondary | ICD-10-CM | POA: Diagnosis present

## 2013-07-01 DIAGNOSIS — Z794 Long term (current) use of insulin: Secondary | ICD-10-CM

## 2013-07-01 DIAGNOSIS — K589 Irritable bowel syndrome without diarrhea: Secondary | ICD-10-CM | POA: Diagnosis present

## 2013-07-01 DIAGNOSIS — E039 Hypothyroidism, unspecified: Secondary | ICD-10-CM | POA: Diagnosis present

## 2013-07-01 DIAGNOSIS — E119 Type 2 diabetes mellitus without complications: Secondary | ICD-10-CM | POA: Diagnosis present

## 2013-07-01 DIAGNOSIS — R0609 Other forms of dyspnea: Secondary | ICD-10-CM | POA: Diagnosis present

## 2013-07-01 DIAGNOSIS — R0989 Other specified symptoms and signs involving the circulatory and respiratory systems: Secondary | ICD-10-CM | POA: Diagnosis present

## 2013-07-01 DIAGNOSIS — I2510 Atherosclerotic heart disease of native coronary artery without angina pectoris: Secondary | ICD-10-CM | POA: Diagnosis present

## 2013-07-01 DIAGNOSIS — I10 Essential (primary) hypertension: Secondary | ICD-10-CM | POA: Diagnosis present

## 2013-07-01 LAB — TROPONIN I
Troponin I: <0.3 ng/mL (ref <0.30)
Troponin I: <0.3 ng/mL (ref <0.30)
Troponin I: <0.3 ng/mL (ref <0.30)

## 2013-07-01 LAB — CBC
HCT: 34.9 % — ABNORMAL LOW (ref 36.0–46.0)
MCHC: 33.8 g/dL (ref 30.0–36.0)
Platelets: 163 10*3/uL (ref 150–400)
RDW: 13.9 % (ref 11.5–15.5)
WBC: 7.4 10*3/uL (ref 4.0–10.5)

## 2013-07-01 LAB — BASIC METABOLIC PANEL
BUN: 10 mg/dL (ref 6–23)
BUN: 7 mg/dL (ref 6–23)
CO2: 27 mEq/L (ref 19–32)
Calcium: 9.1 mg/dL (ref 8.4–10.5)
Chloride: 104 mEq/L (ref 96–112)
Chloride: 104 mEq/L (ref 96–112)
Creatinine, Ser: 0.73 mg/dL (ref 0.50–1.10)
Creatinine, Ser: 0.73 mg/dL (ref 0.50–1.10)
GFR calc Af Amer: >90 mL/min (ref >90)
GFR calc non Af Amer: 88 mL/min — ABNORMAL LOW (ref >90)
Glucose, Bld: 190 mg/dL — ABNORMAL HIGH (ref 70–99)

## 2013-07-01 LAB — CBC WITH DIFFERENTIAL/PLATELET
HCT: 36.6 % (ref 36.0–46.0)
Hemoglobin: 12.7 g/dL (ref 12.0–15.0)
Lymphocytes Relative: 18 % (ref 12–46)
Monocytes Absolute: 0.7 10*3/uL (ref 0.1–1.0)
Monocytes Relative: 8 % (ref 3–12)
Neutro Abs: 6.2 10*3/uL (ref 1.7–7.7)
WBC: 8.8 10*3/uL (ref 4.0–10.5)

## 2013-07-01 LAB — GLUCOSE, CAPILLARY
Glucose-Capillary: 160 mg/dL — ABNORMAL HIGH (ref 70–99)
Glucose-Capillary: 112 mg/dL — ABNORMAL HIGH (ref 70–99)
Glucose-Capillary: 202 mg/dL — ABNORMAL HIGH (ref 70–99)
Glucose-Capillary: 132 mg/dL — ABNORMAL HIGH (ref 70–99)

## 2013-07-01 MED ORDER — INSULIN ASPART 100 UNIT/ML ~~LOC~~ SOLN
0.0000 [IU] | Freq: Three times a day (TID) | SUBCUTANEOUS | Status: DC
  Administered 2013-07-01: 2 [IU] via SUBCUTANEOUS
  Administered 2013-07-01 – 2013-07-02 (×2): 3 [IU] via SUBCUTANEOUS

## 2013-07-01 MED ORDER — NITROGLYCERIN IN D5W 200-5 MCG/ML-% IV SOLN
2.0000 ug/min | INTRAVENOUS | Status: DC
  Administered 2013-07-01: 9 ug/min via INTRAVENOUS
  Administered 2013-07-01: 10 ug/min via INTRAVENOUS

## 2013-07-01 MED ORDER — ALPRAZOLAM 0.25 MG PO TABS
0.2500 mg | ORAL_TABLET | Freq: Three times a day (TID) | ORAL | Status: DC | PRN
  Administered 2013-07-01 – 2013-07-02 (×2): 0.5 mg via ORAL
  Filled 2013-07-01 (×2): qty 2

## 2013-07-01 MED ORDER — NITROGLYCERIN 0.4 MG SL SUBL: 0.4000 mg | SUBLINGUAL_TABLET | SUBLINGUAL | Status: DC | PRN

## 2013-07-01 MED ORDER — SODIUM CHLORIDE 0.9 % IJ SOLN: 3.0000 mL | INTRAMUSCULAR | Status: DC | PRN

## 2013-07-01 MED ORDER — SODIUM CHLORIDE 0.9 % IV SOLN
250.0000 mL | INTRAVENOUS | Status: DC | PRN
  Administered 2013-07-01: 250 mL via INTRAVENOUS

## 2013-07-01 MED ORDER — CLOPIDOGREL BISULFATE 75 MG PO TABS
75.0000 mg | ORAL_TABLET | Freq: Every evening | ORAL | Status: DC
  Administered 2013-07-01: 75 mg via ORAL
  Filled 2013-07-01 (×3): qty 1

## 2013-07-01 MED ORDER — OXYCODONE HCL 5 MG PO TABS
5.0000 mg | ORAL_TABLET | ORAL | Status: DC | PRN
  Administered 2013-07-01 – 2013-07-02 (×3): 5 mg via ORAL
  Filled 2013-07-01 (×3): qty 1

## 2013-07-01 MED ORDER — ALUM & MAG HYDROXIDE-SIMETH 200-200-20 MG/5ML PO SUSP: 30.0000 mL | Freq: Four times a day (QID) | ORAL | Status: DC | PRN

## 2013-07-01 MED ORDER — ACETAMINOPHEN 325 MG PO TABS
650.0000 mg | ORAL_TABLET | Freq: Once | ORAL | Status: AC
  Administered 2013-07-01: 650 mg via ORAL
  Filled 2013-07-01: qty 2

## 2013-07-01 MED ORDER — REGADENOSON 0.4 MG/5ML IV SOLN
0.4000 mg | Freq: Once | INTRAVENOUS | Status: DC
  Filled 2013-07-01: qty 5

## 2013-07-01 MED ORDER — VITAMIN E 180 MG (400 UNIT) PO CAPS
400.0000 [IU] | ORAL_CAPSULE | Freq: Every morning | ORAL | Status: DC
  Administered 2013-07-01 – 2013-07-02 (×2): 400 [IU] via ORAL
  Filled 2013-07-01 (×3): qty 1

## 2013-07-01 MED ORDER — ONDANSETRON HCL 4 MG/2ML IJ SOLN
4.0000 mg | Freq: Four times a day (QID) | INTRAMUSCULAR | Status: DC | PRN
  Administered 2013-07-01: 4 mg via INTRAVENOUS
  Filled 2013-07-01: qty 2

## 2013-07-01 MED ORDER — SODIUM CHLORIDE 0.9 % IJ SOLN: 3.0000 mL | Freq: Two times a day (BID) | INTRAMUSCULAR | Status: DC

## 2013-07-01 MED ORDER — ONDANSETRON HCL 4 MG PO TABS: 4.0000 mg | ORAL_TABLET | Freq: Four times a day (QID) | ORAL | Status: DC | PRN

## 2013-07-01 MED ORDER — HYDROCHLOROTHIAZIDE 12.5 MG PO CAPS
12.5000 mg | ORAL_CAPSULE | Freq: Every day | ORAL | Status: DC
  Administered 2013-07-01 – 2013-07-02 (×2): 12.5 mg via ORAL
  Filled 2013-07-01 (×2): qty 1

## 2013-07-01 MED ORDER — NITROGLYCERIN IN D5W 200-5 MCG/ML-% IV SOLN
2.0000 ug/min | INTRAVENOUS | Status: DC
  Administered 2013-07-01: 5 ug/min via INTRAVENOUS
  Filled 2013-07-01: qty 250

## 2013-07-01 MED ORDER — ACETAMINOPHEN 650 MG RE SUPP: 650.0000 mg | Freq: Four times a day (QID) | RECTAL | Status: DC | PRN

## 2013-07-01 MED ORDER — INSULIN ASPART 100 UNIT/ML ~~LOC~~ SOLN: 0.0000 [IU] | Freq: Every day | SUBCUTANEOUS | Status: DC

## 2013-07-01 MED ORDER — HYDROMORPHONE HCL PF 1 MG/ML IJ SOLN
0.5000 mg | INTRAMUSCULAR | Status: DC | PRN
  Administered 2013-07-01: 1 mg via INTRAVENOUS
  Filled 2013-07-01: qty 1

## 2013-07-01 MED ORDER — ATENOLOL 25 MG PO TABS
25.0000 mg | ORAL_TABLET | Freq: Every morning | ORAL | Status: DC
  Administered 2013-07-01 – 2013-07-02 (×2): 25 mg via ORAL
  Filled 2013-07-01 (×3): qty 1

## 2013-07-01 MED ORDER — ASPIRIN EC 325 MG PO TBEC
325.0000 mg | DELAYED_RELEASE_TABLET | Freq: Every day | ORAL | Status: DC
  Administered 2013-07-01 – 2013-07-02 (×2): 325 mg via ORAL
  Filled 2013-07-01 (×3): qty 1

## 2013-07-01 MED ORDER — ENOXAPARIN SODIUM 40 MG/0.4ML ~~LOC~~ SOLN
40.0000 mg | SUBCUTANEOUS | Status: DC
  Administered 2013-07-01 – 2013-07-02 (×2): 40 mg via SUBCUTANEOUS
  Filled 2013-07-01 (×3): qty 0.4

## 2013-07-01 MED ORDER — ZOLPIDEM TARTRATE 5 MG PO TABS: 5.0000 mg | ORAL_TABLET | Freq: Every evening | ORAL | Status: DC | PRN

## 2013-07-01 MED ORDER — ONDANSETRON HCL 4 MG/2ML IJ SOLN
4.0000 mg | Freq: Once | INTRAMUSCULAR | Status: AC
  Administered 2013-07-01: 4 mg via INTRAVENOUS
  Filled 2013-07-01: qty 2

## 2013-07-01 MED ORDER — GLIMEPIRIDE 2 MG PO TABS
2.0000 mg | ORAL_TABLET | Freq: Every day | ORAL | Status: DC
  Administered 2013-07-01 – 2013-07-02 (×2): 2 mg via ORAL
  Filled 2013-07-01 (×3): qty 1

## 2013-07-01 MED ORDER — ACETAMINOPHEN 325 MG PO TABS
650.0000 mg | ORAL_TABLET | Freq: Four times a day (QID) | ORAL | Status: DC | PRN
  Administered 2013-07-01 – 2013-07-02 (×2): 650 mg via ORAL
  Filled 2013-07-01 (×2): qty 2

## 2013-07-01 MED ORDER — NITROGLYCERIN 2 % TD OINT
0.5000 [in_us] | TOPICAL_OINTMENT | Freq: Four times a day (QID) | TRANSDERMAL | Status: DC
  Administered 2013-07-01 – 2013-07-02 (×5): 0.5 [in_us] via TOPICAL
  Filled 2013-07-01: qty 30

## 2013-07-01 MED ORDER — SIMVASTATIN 10 MG PO TABS
10.0000 mg | ORAL_TABLET | Freq: Every day | ORAL | Status: DC
  Administered 2013-07-01: 10 mg via ORAL
  Filled 2013-07-01 (×3): qty 1

## 2013-07-01 NOTE — Progress Notes (Signed)
  Echocardiogram 2D Echocardiogram has been performed.  Shannon [name] FRANCES 07/01/2013, 5:51 PM

## 2013-07-01 NOTE — Progress Notes (Signed)
CONSULT NOTE  Date: 07/01/2013               Patient Name:  Shannon [name] MRN: [id number]  DOB: Feb 25, 1951 Age / Sex: 62 y.o., female        PCP: No PCP Per Patient Primary Cardiologist: Donato Schultz, MD            Referring Physician: Jetty Duhamel, MD              Reason for Consult: Chest pressure and DOE, hx of CAD           History of Present Illness: Patient is a 62 y.o. female with a PMHx of CAD, stenting , who was admitted to [facility] on 07/01/2013 for evaluation of  Chest pressure.    Last night she had significant wheezing and difficulty breathing while walking in the hospital.  She was here to visit her sister.  No fever.  She tries to limit her salt.  No CP.  She doe not get any regular exercise.      Medications: Outpatient medications: Prescriptions prior to admission  Medication Sig Dispense Refill  . aspirin EC 81 MG tablet Take 81 mg by mouth every morning.      Marland Kitchen atenolol (TENORMIN) 25 MG tablet Take 1 tablet (25 mg total) by mouth every morning.  30 tablet  1  . clopidogrel (PLAVIX) 75 MG tablet Take 75 mg by mouth every evening.      Marland Kitchen glimepiride (AMARYL) 2 MG tablet Take 1 tablet (2 mg total) by mouth daily before breakfast.  30 tablet  0  . nitroGLYCERIN (NITROSTAT) 0.4 MG SL tablet Place 0.4 mg under the tongue every 5 (five) minutes as needed for chest pain.      . pravastatin (PRAVACHOL) 20 MG tablet Take 20 mg by mouth every evening.      . vitamin E (VITAMIN E) 400 UNIT capsule Take 400 Units by mouth every morning.      . zolpidem (AMBIEN) 10 MG tablet Take 10 mg by mouth at bedtime as needed. For neuropathy        Current medications: Current Facility-Administered Medications  Medication Dose Route Frequency Provider Last Rate Last Dose  . acetaminophen (TYLENOL) tablet 650 mg  650 mg Oral Q6H PRN Ron Parker, MD   650 mg at 07/01/13 1058   Or  . acetaminophen (TYLENOL) suppository 650 mg  650 mg Rectal Q6H PRN Ron Parker, MD       . alum & mag hydroxide-simeth (MAALOX/MYLANTA) 200-200-20 MG/5ML suspension 30 mL  30 mL Oral Q6H PRN Ron Parker, MD      . aspirin EC tablet 325 mg  325 mg Oral Daily Ron Parker, MD   325 mg at 07/01/13 1058  . atenolol (TENORMIN) tablet 25 mg  25 mg Oral q morning - 10a Ron Parker, MD   25 mg at 07/01/13 1207  . clopidogrel (PLAVIX) tablet 75 mg  75 mg Oral QPM Harvette C Jenkins, MD      . enoxaparin (LOVENOX) injection 40 mg  40 mg Subcutaneous Q24H Ron Parker, MD   40 mg at 07/01/13 1207  . glimepiride (AMARYL) tablet 2 mg  2 mg Oral QAC breakfast Ron Parker, MD   2 mg at 07/01/13 0802  . hydrochlorothiazide (MICROZIDE) capsule 12.5 mg  12.5 mg Oral Daily Lonia Blood, MD      . HYDROmorphone (DILAUDID) injection 0.5-1 mg  0.5-1 mg Intravenous Q3H PRN Ron Parker, MD      . insulin aspart (novoLOG) injection 0-5 Units  0-5 Units Subcutaneous QHS Harvette C Jenkins, MD      . insulin aspart (novoLOG) injection 0-9 Units  0-9 Units Subcutaneous TID WC Ron Parker, MD   2 Units at 07/01/13 0802  . nitroGLYCERIN (NITROGLYN) 2 % ointment 0.5 inch  0.5 inch Topical Q6H Lonia Blood, MD      . nitroGLYCERIN (NITROSTAT) SL tablet 0.4 mg  0.4 mg Sublingual Q5 min PRN Ron Parker, MD      . ondansetron (ZOFRAN) tablet 4 mg  4 mg Oral Q6H PRN Ron Parker, MD       Or  . ondansetron (ZOFRAN) injection 4 mg  4 mg Intravenous Q6H PRN Ron Parker, MD   4 mg at 07/01/13 0618  . oxyCODONE (Oxy IR/ROXICODONE) immediate release tablet 5 mg  5 mg Oral Q4H PRN Ron Parker, MD   5 mg at 07/01/13 1610  . simvastatin (ZOCOR) tablet 10 mg  10 mg Oral q1800 Ron Parker, MD      . vitamin E capsule 400 Units  400 Units Oral q morning - 10a Ron Parker, MD   400 Units at 07/01/13 1208  . zolpidem (AMBIEN) tablet 5 mg  5 mg Oral QHS PRN Ron Parker, MD         Allergies  Allergen Reactions  . Sulfa  Antibiotics Other (See Comments)    Childhood allergy     Past Medical History  Diagnosis Date  . Hypertension   . Coronary artery disease   . Diabetes mellitus without complication   . Arthritis   . Fibromyalgia   . Irritable bowel     Past Surgical History  Procedure Laterality Date  . Carotid stent    . Cholecystectomy      Family History  Problem Relation Age of Onset  . Cancer Mother   . Diabetes Mother   . Hypertension Mother   . Heart failure Mother   . Cancer Father   . Diabetes Father   . Hypertension Father   . Heart failure Father   . CAD Father   . CAD Mother   . CAD Brother   . CAD Sister     Social History:  reports that she has never smoked. She does not have any smokeless tobacco history on file. She reports that she does not drink alcohol or use illicit drugs.   Review of Systems: Constitutional:  denies fever, chills, diaphoresis, appetite change and fatigue.  HEENT: denies photophobia, eye pain, redness, hearing loss, ear pain, congestion, sore throat, rhinorrhea, sneezing, neck pain, neck stiffness and tinnitus.  Respiratory: admits to SOB, DOE, cough, chest tightness, and wheezing.  Cardiovascular: denies chest pain, palpitations and leg swelling.  Gastrointestinal: admits to nausea, vomiting, abdominal pain, diarrhea, constipation, blood in stool.  She has IBS  Genitourinary: denies dysuria, urgency, frequency, hematuria, flank pain and difficulty urinating.  Musculoskeletal: denies  myalgias, back pain, joint swelling, arthralgias and gait problem.   Skin: denies pallor, rash and wound.  Neurological: denies dizziness, seizures, syncope, weakness, light-headedness, numbness and headaches.   Hematological: denies adenopathy, easy bruising, personal or family bleeding history.  Psychiatric/ Behavioral: denies suicidal ideation, mood changes, confusion, nervousness, sleep disturbance and agitation.    Physical Exam: BP 147/60  Pulse 55   Temp(Src) 97.8 F (36.6 C) (Oral)  Resp 10  Ht 5\' 4"  (1.626 m)  Wt 149 lb 4 oz (67.7 kg)  BMI 25.61 kg/m2  SpO2 100%  General: Vital signs reviewed and noted. Well-developed, well-nourished, in no acute distress; alert, appropriate and cooperative throughout examination.  Head: Normocephalic, atraumatic, sclera anicteric, mucus membranes are moist  Neck: Supple. Negative for carotid bruits. JVD not elevated.  Lungs:  Clear bilaterally to auscultation without wheezes, rales, or rhonchi. Breathing is unlabored.  Heart: RRR with S1 S2. No murmurs, rubs, or gallops appreciated.  Abdomen:  Soft, non-tender, non-distended with normoactive bowel sounds. No hepatomegaly. No rebound/guarding. No obvious abdominal masses  MSK: Strength and the appear normal for age.  Extremities: No clubbing or cyanosis. No edema.  Distal pedal pulses are 2+ and equal bilaterally.  Neurologic: Alert and oriented X 3. Moves all extremities spontaneously.  Psych: Responds to questions appropriately with a normal affect.    Lab results: Basic Metabolic Panel:  Recent Labs Lab 07/01/13 0237 07/01/13 0720  NA 140 140  K 3.7 3.7  CL 104 104  CO2 27 30  GLUCOSE 190* 190*  BUN 10 7  CREATININE 0.73 0.73  CALCIUM 9.2 9.1    Liver Function Tests: No results found for this basename: AST, ALT, ALKPHOS, BILITOT, PROT, ALBUMIN,  in the last 168 hours No results found for this basename: LIPASE, AMYLASE,  in the last 168 hours No results found for this basename: AMMONIA,  in the last 168 hours  CBC:  Recent Labs Lab 07/01/13 0237 07/01/13 0720  WBC 8.8 7.4  NEUTROABS 6.2  --   HGB 12.7 11.8*  HCT 36.6 34.9*  MCV 85.9 86.2  PLT 188 163    Cardiac Enzymes:  Recent Labs Lab 07/01/13 0234 07/01/13 0452 07/01/13 1050  TROPONINI <0.30 <0.30 <0.30    BNP: No components found with this basename: POCBNP,   CBG:  Recent Labs Lab 07/01/13 0744 07/01/13 1236  GLUCAP 160* 112*    Coagulation  Studies: No results found for this basename: LABPROT, INR,  in the last 72 hours   Other results:  EKG :  NSR   Imaging: Dg Chest 2 View  07/01/2013   *RADIOLOGY REPORT*  Clinical Data: Chest pain center of the chest.  Shortness of breath.  CHEST - 2 VIEW  Comparison: 06/17/2005  Findings: Normal heart size and pulmonary vascularity. Peribronchial thickening with perihilar infiltration suggesting chronic bronchitis.  Slight fibrosis in the lung bases.  No focal consolidation in the lungs.  No blunting of costophrenic angles. No pneumothorax.  Mediastinal contours appear intact.  Calcified and tortuous aorta.  No significant changes since previous study allowing for differences in technique.  IMPRESSION: Chronic bronchitic changes and slight fibrosis in the lungs.  No evidence of active pulmonary disease.  Cardiac enlargement.   Original Report Authenticated By: Burman Nieves, M.D.      Assessment & Plan: 1. shortness of breath with exertion: The patient was at the hospital yesterday visiting her sister became very short of breath while walking. It is quite likely that she just overexerted herself said she does not walk on a regular basis. Her cardiac enzymes are negative. Her symptoms are somewhat atypical and do not sound like her previous episodes of angina. However she has not seen her cardiologist in quite some time and has not had a stress test.  We'll get an echocardiogram for further assessment of her left ventricular function-she likely has diastolic dysfunction. We'll schedule her for a stress Myoview study tomorrow.  She  is quite anxious about staying in the hospital. She apparently is losing her apartment and needs to move all of her stuff out of the apartment by next week. She's worried that she'll stay in the hospital for such a long time that she would not be able to recover her stuff.  I informed her that there was a good chance that a stress test would be negative and that she  would be discharged home tomorrow anyway.  2. Hx of CAD:  She is on low dose simvastatin.  Will check lipids tomorrow am    DVT PPX -    Vesta Mixer, Montez Hageman., MD, [facility] 07/01/2013, 2:19 PM

## 2013-07-01 NOTE — Progress Notes (Signed)
[facility] Progress Note [place] TEAM 1 - Stepdown/ICU TEAM   Claire Dolores Beets ION:[id number] DOB: 1951-10-11 DOA: 07/01/2013 PCP: No PCP Per Patient  Brief narrative: 62 y.o. female with a history of CAD who presented to the ED with complaints of chest pressure and DOE while walking from the parking lot into the halls of the hospital at 5:30 PM. She was visiting someone at the time. She described having heaviness in the center of her chest and numbness that radiated down both arms. She took her long acting nitro tablet and an aspirin before she went into the ED, and her pain was not relieved. In the ED her workup was initially negative but her chest pain was unrelieved so she was started on and IV NTG drip and then she was referred for medical admission.   Assessment/Plan:  Chest pain w/ hx of CAD Trop negative x3 - EKG w/o acute ST/T wave changes noted - had abnormal nuc med stress in 2011 - saw Dr. Anne Fu >> cath May 2011 >> DES (PROMUS) to mid LAD at bifurcation of 2nd diag) - on ASA at home - supposed to be on Plavix but has not been taking due to $ limitations - pt has not had f/u for a number of years now as she lost her insurance coverge- given description of sx I suspect f/u cath may be indicated - I have consulted Cardiology  Hypertension BP poorly controlled at this time - adjust tx and follow  Diabetes CBG modestly elevated - follow w/o change today   HLD  Cont Pravachol  Hypothyroidism  appears that she has been hesitant to initiate tx/appear for f/u testing   Fibromyalgia  IBS   Overweight BMI is ~26  PVD s/p carotid artery stent  Loss of insurance - financial barriers to care CSW consult   Code Status: FULL Family Communication: no family present at time of exam Disposition Plan: SDU  Consultants: Cardiology  Procedures: none  Antibiotics: none  DVT prophylaxis: lovenox  HPI/Subjective: Pt seen for f/u visit.  Objective: Blood  pressure 159/71, pulse 62, temperature 98.1 F (36.7 C), temperature source Oral, resp. rate 16, height 5\' 4"  (1.626 m), weight 67.7 kg (149 lb 4 oz), SpO2 100.00%.  Intake/Output Summary (Last 24 hours) at 07/01/13 1203 Last data filed at 07/01/13 0900  Gross per 24 hour  Intake  30.41 ml  Output      0 ml  Net  30.41 ml   Exam: F/U exam completed  Data Reviewed: Basic Metabolic Panel:  Recent Labs Lab 07/01/13 0237 07/01/13 0720  NA 140 140  K 3.7 3.7  CL 104 104  CO2 27 30  GLUCOSE 190* 190*  BUN 10 7  CREATININE 0.73 0.73  CALCIUM 9.2 9.1   CBC:  Recent Labs Lab 07/01/13 0237 07/01/13 0720  WBC 8.8 7.4  NEUTROABS 6.2  --   HGB 12.7 11.8*  HCT 36.6 34.9*  MCV 85.9 86.2  PLT 188 163   Cardiac Enzymes:  Recent Labs Lab 07/01/13 0234 07/01/13 0452 07/01/13 1050  TROPONINI <0.30 <0.30 <0.30   CBG:  Recent Labs Lab 07/01/13 0744  GLUCAP 160*    Recent Results (from the past 240 hour(s))  MRSA PCR SCREENING     Status: None   Collection Time    07/01/13  5:59 AM      Result Value Range Status   MRSA by PCR NEGATIVE  NEGATIVE Final   Comment:  The GeneXpert MRSA Assay (FDA     approved for NASAL specimens     only), is one component of a     comprehensive MRSA colonization     surveillance program. It is not     intended to diagnose MRSA     infection nor to guide or     monitor treatment for     MRSA infections.     Studies:  Recent x-ray studies have been reviewed in detail by the Attending Physician  Scheduled Meds:  Scheduled Meds: . aspirin EC  325 mg Oral Daily  . atenolol  25 mg Oral q morning - 10a  . clopidogrel  75 mg Oral QPM  . enoxaparin (LOVENOX) injection  40 mg Subcutaneous Q24H  . glimepiride  2 mg Oral QAC breakfast  . insulin aspart  0-5 Units Subcutaneous QHS  . insulin aspart  0-9 Units Subcutaneous TID WC  . simvastatin  10 mg Oral q1800  . sodium chloride  3 mL Intravenous Q12H  . vitamin E  400  Units Oral q morning - 10a    Time spent on care of this patient: 25+ mins   [facility] T  [facility] Office  [phone] Pager - Text Page per Loretha Stapler as per below:  On-Call/Text Page:      [email or web address]      password [email or web address]  If 7PM-7AM, please contact night-coverage [email or web address] Password [email or web address] 07/01/2013, 12:03 PM   LOS: 0 days

## 2013-07-01 NOTE — Progress Notes (Signed)
Utilization Review completed.  

## 2013-07-01 NOTE — ED Notes (Signed)
Pt arrived via EMS. States that she experienced exertion today while walking to visit a friend in the hospital. Pt states that she has chronic angina chest pain. This afternoon she had increased SOB and chest tightness.

## 2013-07-01 NOTE — ED Notes (Addendum)
Pt arrived via EMS. Complaints of chest tightness and SOB. Upper EW, albuterol given via EMS. Pt states she felt exerted today after visiting her sister in the hospital. Pt still felt heaviness on chest after nebulizer. Pt took 2 baby aspirins and NTG at home prior to EMS arrival. Pt states that she has had an increase in stress and anxiety lately.

## 2013-07-01 NOTE — H&P (Signed)
[facility] History and Physical  Shannon [name] ZOX:[id number] DOB: February 01, 1951 DOA: 07/01/2013  Referring physician:  EDP PCP: No PCP Per Patient  Specialists:   Chief Complaint:   Chest Pressure and SOB  HPI: Shannon [name] is a 62 y.o. female with a history of CAD who presented to the ED with complaints of Chest pressure and SOB and DOE while walking from the parking lot and into the halls of the hospital at 5:30 PM.  She was visiting someone at the time.   She describes having heaviness in the center of her chest and numbness that radiated down both arms.   She took her long acting Nitro tablet and an aspirin before she went into the ED, and her pain was not relieved.  In the ED her workup was initially negative but her chest pain was unrelieved so she was started on and IV NTG drip and then she was referred for medical admission.     Review of Systems: The patient denies anorexia, fever, chills, headaches, weight loss, vision loss, diplopia, dizziness, decreased hearing, rhinitis, hoarseness, syncope, peripheral edema, balance deficits, cough, hemoptysis, abdominal pain, nausea, vomiting, diarrhea, constipation, hematemesis, melena, hematochezia, severe indigestion/heartburn, dysuria, hematuria, incontinence, muscle weakness, suspicious skin lesions, transient blindness, difficulty walking, depression, unusual weight change, abnormal bleeding, enlarged lymph nodes, angioedema, and breast masses.    Past Medical History  Diagnosis Date  . Hypertension   . Coronary artery disease   . Diabetes mellitus without complication   . Arthritis   . Fibromyalgia   . Irritable bowel     Past Surgical History  Procedure Laterality Date  . Carotid stent    . Cholecystectomy      Prior to Admission medications   Medication Sig Start Date End Date Taking? Authorizing Provider  aspirin EC 81 MG tablet Take 81 mg by mouth every morning.   Yes Historical Provider, MD  atenolol  (TENORMIN) 25 MG tablet Take 1 tablet (25 mg total) by mouth every morning. 02/10/13  Yes Leroy Sea, MD  clopidogrel (PLAVIX) 75 MG tablet Take 75 mg by mouth every evening.   Yes Historical Provider, MD  glimepiride (AMARYL) 2 MG tablet Take 1 tablet (2 mg total) by mouth daily before breakfast. 02/10/13  Yes Leroy Sea, MD  nitroGLYCERIN (NITROSTAT) 0.4 MG SL tablet Place 0.4 mg under the tongue every 5 (five) minutes as needed for chest pain.   Yes Historical Provider, MD  pravastatin (PRAVACHOL) 20 MG tablet Take 20 mg by mouth every evening.   Yes Historical Provider, MD  vitamin E (VITAMIN E) 400 UNIT capsule Take 400 Units by mouth every morning.   Yes Historical Provider, MD  zolpidem (AMBIEN) 10 MG tablet Take 10 mg by mouth at bedtime as needed. For neuropathy   Yes Historical Provider, MD    Allergies  Allergen Reactions  . Sulfa Antibiotics Other (See Comments)    Childhood allergy    Social History:  reports that she has never smoked. She does not have any smokeless tobacco history on file. She reports that she does not drink alcohol or use illicit drugs.     Family History  Problem Relation Age of Onset  . Cancer Mother   . Diabetes Mother   . Hypertension Mother   . Heart failure Mother   . Cancer Father   . Diabetes Father   . Hypertension Father   . Heart failure Father   . CAD Father   .  CAD Mother   . CAD Brother   . CAD Sister      Physical Exam:  GEN:  Pleasant  Elderly Obese  62 y.o. Caucasian female  examined  and in no acute distress; cooperative with exam Filed Vitals:   07/01/13 0211 07/01/13 0230 07/01/13 0245 07/01/13 0430  BP:  172/68 166/83 196/80  Pulse:  68 59 71  Temp:      TempSrc:      Resp:  15 19 12   Height:      Weight:      SpO2: 97% 97% 100% 99%   Blood pressure 196/80, pulse 71, temperature 98.8 F (37.1 C), temperature source Oral, resp. rate 12, height 5\' 4"  (1.626 m), weight 70.308 kg (155 lb), SpO2 99.00%. PSYCH:  She is alert and oriented x4; does not appear anxious does not appear depressed; affect is normal HEENT: Normocephalic and Atraumatic, Mucous membranes pink; PERRLA; EOM intact; Fundi:  Benign;  No scleral icterus, Nares: Patent, Oropharynx: Clear, Fair Dentition, Neck:  FROM, no cervical lymphadenopathy nor thyromegaly or carotid bruit; no JVD; Breasts:: Not examined CHEST WALL: No tenderness CHEST: Normal respiration, clear to auscultation bilaterally HEART: Regular rate and rhythm; no murmurs rubs or gallops BACK: No kyphosis or scoliosis; no CVA tenderness ABDOMEN: Positive Bowel Sounds,  Obese, soft non-tender; no masses, no organomegaly, no pannus; no intertriginous candida. Rectal Exam: Not done EXTREMITIES: No cyanosis, clubbing or edema; no ulcerations. M+Varicosities of distal 1/3 of BLEs.   Genitalia: not examined PULSES: 2+ and symmetric SKIN: Normal hydration no rash or ulceration CNS: Cranial nerves 2-12 grossly intact no focal neurologic deficit    Labs on Admission:  Basic Metabolic Panel:  Recent Labs Lab 07/01/13 0237  NA 140  K 3.7  CL 104  CO2 27  GLUCOSE 190*  BUN 10  CREATININE 0.73  CALCIUM 9.2   Liver Function Tests: No results found for this basename: AST, ALT, ALKPHOS, BILITOT, PROT, ALBUMIN,  in the last 168 hours No results found for this basename: LIPASE, AMYLASE,  in the last 168 hours No results found for this basename: AMMONIA,  in the last 168 hours CBC:  Recent Labs Lab 07/01/13 0237  WBC 8.8  NEUTROABS 6.2  HGB 12.7  HCT 36.6  MCV 85.9  PLT 188   Cardiac Enzymes:  Recent Labs Lab 07/01/13 0234  TROPONINI <0.30    BNP (last 3 results) No results found for this basename: PROBNP,  in the last 8760 hours CBG: No results found for this basename: GLUCAP,  in the last 168 hours  Radiological Exams on Admission: Dg Chest 2 View  07/01/2013   *RADIOLOGY REPORT*  Clinical Data: Chest pain center of the chest.  Shortness of breath.   CHEST - 2 VIEW  Comparison: 06/17/2005  Findings: Normal heart size and pulmonary vascularity. Peribronchial thickening with perihilar infiltration suggesting chronic bronchitis.  Slight fibrosis in the lung bases.  No focal consolidation in the lungs.  No blunting of costophrenic angles. No pneumothorax.  Mediastinal contours appear intact.  Calcified and tortuous aorta.  No significant changes since previous study allowing for differences in technique.  IMPRESSION: Chronic bronchitic changes and slight fibrosis in the lungs.  No evidence of active pulmonary disease.  Cardiac enlargement.   Original Report Authenticated By: Burman Nieves, M.D.     EKG: Independently reviewed.  Normal sinus Rhythm No Acute S-T changes.     Assessment/Plan Principal Problem:   Atypical chest pain Active Problems:  CAD (coronary artery disease)   Hypertension, essential   Diabetes   Fibromyalgia   IBS (irritable bowel syndrome)   Obesity    1.   Atypical Chest Pain- Admitted to Stepdown on an IV NTG Drip, cycle troponins, and placed on O2 and ASA rx, already on a betablocker.    2.   CAD- Same as #1, See Dr. Dione Housekeeper Cardiology.    3.    HTN-  Continue Beta blocker therapy, and PRN IV hydralazine for SBP > 160.    4.    DM2- Hold Amaryl Rx, Add SSI coverage.    5.    Fibromyalgia-   Pain Control PRN.     6.    Obesity- chronic, weight loss recommended.         Code Status:   FULL CODE Family Communication:   No Family Present Disposition Plan:    Return to Home  Time spent:  27 Minutes  Ron Parker [facility] Pager [phone]  If 7PM-7AM, please contact night-coverage [email or web address] Password [facility] 07/01/2013, 5:09 AM

## 2013-07-01 NOTE — Progress Notes (Signed)
Nutrition Brief Note  Patient identified on the Malnutrition Screening Tool (MST) Report  Wt Readings from Last 15 Encounters:  07/01/13 149 lb 4 oz (67.7 kg)    Body mass index is 25.61 kg/(m^2). Patient meets criteria for overweight based on current BMI.   Current diet order is Heart Healthy, patient is consuming approximately 50% of meals at this time per her report. Labs and medications reviewed.   RD met with pt who denies nutrition-related concerns.  She reports she has a good appetite and is getting hungry.  She attributes wt loss to increased activity.  RD provided pt with snack. No nutrition interventions warranted at this time. If nutrition issues arise, please consult RD.   Loyce Dys, MS RD LDN Clinical Inpatient Dietitian Pager: [phone] Weekend/After hours pager: [phone]

## 2013-07-01 NOTE — ED Provider Notes (Signed)
CSN: [id number]     Arrival date & time 07/01/13  0147 History     First MD Initiated Contact with Patient 07/01/13 0205     Chief Complaint  Patient presents with  . Chest Pain   (Consider location/radiation/quality/duration/timing/severity/associated sxs/prior Treatment) HPI 62 yo Shannon [name] presents to the ER from home via EMS with complaint of chest pressure and shortness of breath.  She denies h/o COPD or asthma, but reports she has lived with a smoker for many years.  Sxs started today, and were associated with chest pressure.  She took her ntg and isosorbide with minimal improvement.  She has had nausea, but denies diaphoresis.  Pt reports episode about 6 weeks ago where she had chest pressure, nausea, diaphoresis.  She took ntg until the pain resolved, never sought care.  She has h/o htn, CAD s/p stent in 2011, DM.  She does not currently have PCM.  Pt received albuterol in route from EMS, reports slight improvement in symptoms.  Past Medical History  Diagnosis Date  . Hypertension   . Coronary artery disease   . Diabetes mellitus without complication   . Arthritis   . Fibromyalgia   . Irritable bowel    Past Surgical History  Procedure Laterality Date  . Carotid stent    . Cholecystectomy     Family History  Problem Relation Age of Onset  . Cancer Mother   . Diabetes Mother   . Hypertension Mother   . Heart failure Mother   . Cancer Father   . Diabetes Father   . Hypertension Father   . Heart failure Father    History  Substance Use Topics  . Smoking status: Never Smoker   . Smokeless tobacco: Not on file  . Alcohol Use: No   OB History   Grav Para Term Preterm Abortions TAB SAB Ect Mult Living   12 4 4  8           Review of Systems  All other systems reviewed and are negative.    Allergies  Sulfa antibiotics  Home Medications   Current Outpatient Rx  Name  Route  Sig  Dispense  Refill  . aspirin EC 81 MG tablet   Oral   Take 81 mg by mouth every  morning.         Marland Kitchen atenolol (TENORMIN) 25 MG tablet   Oral   Take 1 tablet (25 mg total) by mouth every morning.   30 tablet   1   . clopidogrel (PLAVIX) 75 MG tablet   Oral   Take 75 mg by mouth every evening.         Marland Kitchen glimepiride (AMARYL) 2 MG tablet   Oral   Take 1 tablet (2 mg total) by mouth daily before breakfast.   30 tablet   0   . nitroGLYCERIN (NITROSTAT) 0.4 MG SL tablet   Sublingual   Place 0.4 mg under the tongue every 5 (five) minutes as needed for chest pain.         . pravastatin (PRAVACHOL) 20 MG tablet   Oral   Take 20 mg by mouth every evening.         . vitamin E (VITAMIN E) 400 UNIT capsule   Oral   Take 400 Units by mouth every morning.         . zolpidem (AMBIEN) 10 MG tablet   Oral   Take 10 mg by mouth at bedtime as needed. For neuropathy  BP 166/83  Pulse 59  Temp(Src) 98.8 F (37.1 C) (Oral)  Resp 19  Ht 5\' 4"  (1.626 m)  Wt 155 lb (70.308 kg)  BMI 26.59 kg/m2  SpO2 100% Physical Exam  Nursing note and vitals reviewed. Constitutional: She is oriented to person, place, and time. She appears well-developed and well-nourished. She appears distressed (uncomfortable appearing).  HENT:  Head: Normocephalic and atraumatic.  Right Ear: External ear normal.  Left Ear: External ear normal.  Nose: Nose normal.  Mouth/Throat: Oropharynx is clear and moist.  Eyes: Conjunctivae and EOM are normal. Pupils are equal, round, and reactive to light.  Neck: Normal range of motion. Neck supple. No JVD present. No tracheal deviation present. No thyromegaly present.  Cardiovascular: Normal rate, regular rhythm, normal heart sounds and intact distal pulses.  Exam reveals no gallop and no friction rub.   No murmur heard. Pulmonary/Chest: Effort normal and breath sounds normal. No stridor. No respiratory distress. She has no wheezes. She has no rales. She exhibits no tenderness.  Abdominal: Soft. Bowel sounds are normal. She exhibits no  distension and no mass. There is no tenderness. There is no rebound and no guarding.  Musculoskeletal: Normal range of motion. She exhibits no edema and no tenderness.  Lymphadenopathy:    She has no cervical adenopathy.  Neurological: She is alert and oriented to person, place, and time. No cranial nerve deficit. She exhibits normal muscle tone. Coordination normal.  Skin: Skin is warm and dry. No rash noted. No erythema. No pallor.  Psychiatric: She has a normal mood and affect. Her behavior is normal. Judgment and thought content normal.    ED Course   Procedures (including critical care time)  Labs Reviewed  BASIC METABOLIC PANEL - Abnormal; Notable for the following:    Glucose, Bld 190 (*)    GFR calc non Af Amer 88 (*)    All other components within normal limits  MRSA PCR SCREENING  CBC WITH DIFFERENTIAL  TROPONIN I  BASIC METABOLIC PANEL  CBC  TROPONIN I  TROPONIN I  TROPONIN I  HEMOGLOBIN A1C   Dg Chest 2 View  07/01/2013   *RADIOLOGY REPORT*  Clinical Data: Chest pain center of the chest.  Shortness of breath.  CHEST - 2 VIEW  Comparison: 06/17/2005  Findings: Normal heart size and pulmonary vascularity. Peribronchial thickening with perihilar infiltration suggesting chronic bronchitis.  Slight fibrosis in the lung bases.  No focal consolidation in the lungs.  No blunting of costophrenic angles. No pneumothorax.  Mediastinal contours appear intact.  Calcified and tortuous aorta.  No significant changes since previous study allowing for differences in technique.  IMPRESSION: Chronic bronchitic changes and slight fibrosis in the lungs.  No evidence of active pulmonary disease.  Cardiac enlargement.   Original Report Authenticated By: Burman Nieves, M.D.    Date: 07/01/2013  Rate:70  Rhythm: normal sinus rhythm  QRS Axis: normal  Intervals: normal  ST/T Wave abnormalities: normal  Conduction Disutrbances:none  Narrative Interpretation:   Old EKG Reviewed:  unchanged    1. Chest pain   2. Dyspnea   3. Atypical chest pain   4. CAD (coronary artery disease)   5. Diabetes   6. Hypertension, essential   7. Fibromyalgia   8. IBS (irritable bowel syndrome)   9. Obesity     MDM  61 yo Shannon [name] with chest pressure, sob.  Concern for anginal equivalent given h/o DM, prior CAD.  Will d/w hospitalist for admission.  Olivia Mackie, MD  07/01/13 0757 

## 2013-07-02 LAB — LIPID PANEL
HDL: 53 mg/dL (ref >39)
LDL Cholesterol: 102 mg/dL — ABNORMAL HIGH (ref 0–99)

## 2013-07-02 LAB — GLUCOSE, CAPILLARY
Glucose-Capillary: 112 mg/dL — ABNORMAL HIGH (ref 70–99)
Glucose-Capillary: 215 mg/dL — ABNORMAL HIGH (ref 70–99)

## 2013-07-02 MED ORDER — ENALAPRIL-HYDROCHLOROTHIAZIDE 5-12.5 MG PO TABS: 1.0000 | ORAL_TABLET | Freq: Every day | ORAL | Status: DC

## 2013-07-02 MED ORDER — HYDRALAZINE HCL 20 MG/ML IJ SOLN
10.0000 mg | Freq: Four times a day (QID) | INTRAMUSCULAR | Status: DC | PRN
  Administered 2013-07-02: 10 mg via INTRAVENOUS
  Filled 2013-07-02 (×2): qty 1

## 2013-07-02 MED ORDER — LOVASTATIN 20 MG PO TABS: 20.0000 mg | ORAL_TABLET | Freq: Every day | ORAL | Status: DC

## 2013-07-02 MED ORDER — CARVEDILOL 6.25 MG PO TABS: 6.2500 mg | ORAL_TABLET | Freq: Two times a day (BID) | ORAL | Status: DC

## 2013-07-02 MED ORDER — ALPRAZOLAM 0.25 MG PO TABS: 0.2500 mg | ORAL_TABLET | Freq: Three times a day (TID) | ORAL | Status: DC | PRN

## 2013-07-02 MED ORDER — CARVEDILOL 6.25 MG PO TABS
6.2500 mg | ORAL_TABLET | Freq: Two times a day (BID) | ORAL | Status: DC
  Filled 2013-07-02 (×2): qty 1

## 2013-07-02 MED ORDER — NITROGLYCERIN 0.4 MG SL SUBL: 0.4000 mg | SUBLINGUAL_TABLET | SUBLINGUAL | Status: DC | PRN

## 2013-07-02 NOTE — Progress Notes (Signed)
Patient has communicated that she does not want stress test done in AM multiple times. MD made aware, re-ordering diet with MD okay due to patient request to eat/drink.

## 2013-07-02 NOTE — Progress Notes (Addendum)
SUBJECTIVE:  No complaints of chest pain or SOB  OBJECTIVE:   Vitals:   Filed Vitals:   07/02/13 0301 07/02/13 0421 07/02/13 0434 07/02/13 0559  BP: 181/69 133/64 133/64 182/78  Pulse:   79   Temp:   98 F (36.7 C)   TempSrc:   Axillary   Resp:   14   Height:      Weight:   67.6 kg (149 lb 0.5 oz)   SpO2:   93%    I&O's:   Intake/Output Summary (Last 24 hours) at 07/02/13 5409 Last data filed at 07/02/13 0600  Gross per 24 hour  Intake 821.21 ml  Output   4000 ml  Net -3178.79 ml   TELEMETRY: Reviewed telemetry pt in NSR:     PHYSICAL EXAM General: Well developed, well nourished, in no acute distress Head: Eyes PERRLA, No xanthomas.   Normal cephalic and atramatic  Lungs:   Clear bilaterally to auscultation and percussion. Heart:   HRRR S1 S2 Pulses are 2+ & equal. Abdomen: Bowel sounds are positive, abdomen soft and non-tender without masses  Extremities:   No clubbing, cyanosis or edema.  DP +1 Neuro: Alert and oriented X 3. Psych:  Good affect, responds appropriately   LABS: Basic Metabolic Panel:  Recent Labs  81/19/14 0237 07/01/13 0720  NA 140 140  K 3.7 3.7  CL 104 104  CO2 27 30  GLUCOSE 190* 190*  BUN 10 7  CREATININE 0.73 0.73  CALCIUM 9.2 9.1   Liver Function Tests: No results found for this basename: AST, ALT, ALKPHOS, BILITOT, PROT, ALBUMIN,  in the last 72 hours No results found for this basename: LIPASE, AMYLASE,  in the last 72 hours CBC:  Recent Labs  07/01/13 0237 07/01/13 0720  WBC 8.8 7.4  NEUTROABS 6.2  --   HGB 12.7 11.8*  HCT 36.6 34.9*  MCV 85.9 86.2  PLT 188 163   Cardiac Enzymes:  Recent Labs  07/01/13 0452 07/01/13 1050 07/01/13 1637  TROPONINI <0.30 <0.30 <0.30   BNP: No components found with this basename: POCBNP,  D-Dimer: No results found for this basename: DDIMER,  in the last 72 hours Hemoglobin A1C:  Recent Labs  07/01/13 0720  HGBA1C 7.1*   Fasting Lipid Panel:  Recent Labs   07/02/13 0500  CHOL 199  HDL 53  LDLCALC 102*  TRIG 221*  CHOLHDL 3.8   Thyroid Function Tests: No results found for this basename: TSH, T4TOTAL, FREET3, T3FREE, THYROIDAB,  in the last 72 hours Anemia Panel: No results found for this basename: VITAMINB12, FOLATE, FERRITIN, TIBC, IRON, RETICCTPCT,  in the last 72 hours Coag Panel:   No results found for this basename: INR, PROTIME    RADIOLOGY: Dg Chest 2 View  07/01/2013   *RADIOLOGY REPORT*  Clinical Data: Chest pain center of the chest.  Shortness of breath.  CHEST - 2 VIEW  Comparison: 06/17/2005  Findings: Normal heart size and pulmonary vascularity. Peribronchial thickening with perihilar infiltration suggesting chronic bronchitis.  Slight fibrosis in the lung bases.  No focal consolidation in the lungs.  No blunting of costophrenic angles. No pneumothorax.  Mediastinal contours appear intact.  Calcified and tortuous aorta.  No significant changes since previous study allowing for differences in technique.  IMPRESSION: Chronic bronchitic changes and slight fibrosis in the lungs.  No evidence of active pulmonary disease.  Cardiac enlargement.   Original Report Authenticated By: Burman Nieves, M.D.    Assessment & Plan:  1. shortness  of breath with exertion: The patient was at the hospital yesterday visiting her sister became very short of breath while walking. It is quite likely that she just overexerted herself said she does not walk on a regular basis. Her cardiac enzymes are negative. Her symptoms are somewhat atypical and do not sound like her previous episodes of angina. However she has not seen her cardiologist in quite some time and has not had a stress test. She is quite anxious about staying in the hospital. She apparently is losing her apartment and needs to move all of her stuff out of the apartment by next week. She's worried that she'll stay in the hospital for such a long time that she would not be able to recover her stuff.   A stress test was ordered for this am but she is adamant that she is not going to agree to the stress test.  She wants to go home and get the stress test as an outpatient which I think is reasonable as long as echo shows normal LVF.  Cardiac enzymes are all normal. 2. Hx of CAD: She is on low dose simvastatin.  -please have patient followup with Dr. Anne Fu next week to set up outpatient nuclear stress test. 3.  HTN - BP is elevated this am but she is very upset about having to be in the hospital    Quintella Reichert, MD  07/02/2013  8:23 AM

## 2013-07-02 NOTE — Progress Notes (Signed)
   CARE MANAGEMENT NOTE 07/02/2013  Patient:  Shannon [name], Shannon [name]   Account Number:  [id number]  Date Initiated:  07/01/2013  Documentation initiated by:  Lanier Clam  Subjective/Objective Assessment:   62 y/o f admitted w/chest pain.     Action/Plan:   From home.   Anticipated DC Date:  07/04/2013   Anticipated DC Plan:  HOME/SELF CARE      DC Planning Services  MATCH Program  CM consult      Choice offered to / List presented to:             Status of service:  Completed, signed off Medicare Important Message given?   (If response is "NO", the following Medicare IM given date fields will be blank) Date Medicare IM given:   Date Additional Medicare IM given:    Discharge Disposition:  HOME/SELF CARE  Per UR Regulation:  Reviewed for med. necessity/level of care/duration of stay  If discussed at Long Length of Stay Meetings, dates discussed:    Comments:  07-02-13 14:30 CM spoke to pt and gave [facility] letter with participating pharmacy list.  I explained she has only one week before the card expires and it cannot be exercised again within the calendar year.  Pt stated she understood. Pt [facility] also [facility] her to get a P[facility].  No other CM needs were communicated.  Freddy Jaksch, BSN, CM [phone]

## 2013-07-02 NOTE — Discharge Summary (Signed)
DISCHARGE SUMMARY  Shannon [name]  MR#: [id number]  DOB:Aug 24, 1951  Date of Admission: 07/01/2013 Date of Discharge: 07/02/2013  Attending Physician:Shannon [name]  Patient's PCP:No PCP Per Patient  Consults: [name] - Shannon [name]  Disposition: D/C home at pt request  Follow-up Appts:     Follow-up Information   Follow up with Shannon Schultz, MD. Schedule an appointment as soon as possible for a visit in 5 days.   Specialty:  Cardiology   Contact information:   [address] [place] [address] [phone]       Tests Needing Follow-up: outpt stress test is suggested - BP recheck will also be indicated   Discharge Diagnoses: Chest pain/DOE w/ hx of CAD  Hypertension  Diabetes  HLD  Hypothyroidism  Fibromyalgia  IBS  Overweight  PVD s/p carotid artery stent  Loss of insurance - financial barriers to care   Initial presentation: 62 y.o. female with a history of CAD who presented to the ED with complaints of chest pressure and DOE while walking from the parking lot into the halls of the hospital at 5:30 PM. She was visiting someone at the time. She described having heaviness in the center of her chest and numbness that radiated down both arms. She took her long acting nitro tablet and an aspirin before she went into the ED, and her pain was not relieved. In the ED her workup was initially negative but her chest pain was unrelieved so she was started on and IV NTG drip and then she was referred for medical admission.   Hospital Course:  Chest pain w/ hx of CAD  Trop negative x3 - EKG w/o acute ST/[name] wave changes noted - had abnormal nuc med stress in 2011 - saw Dr. Anne [name] >> cath May 2011 >> DES (PROMUS) to mid LAD at bifurcation of 2nd diag) - on ASA at home - supposed to be on Plavix but has not been taking due to $ limitations - pt has not had f/u for a number of years now as she lost her insurance coverge - TTE w/o focal WMA and with preserved EF  - pt desired d/c home for outpt risk stratification - pt to call Dr. Anne [name] 8/25 to arrange for outpt f/u and eventual oupt stress test - in that pt had not been taking plavix for many months due to $ concerns, and given that DES was placed in 2011, this med was not re-prescribed during this admit  Hypertension  BP poorly controlled, due to lack of meds as outpt, and related to anxiety as inpatient - all d/c meds from $4 med list - counseled on importance of strict med compliance for long term health   Diabetes  CBG modestly elevated - follow w/o change today - continue home med tx at d/c - add ACE inhibitor to regimen (Vasotec/HCTZ)  HLD  Change to Mevacor which is on $4 list  Hypothyroidism  appears that she has been hesitant to initiate tx/appear for f/u testing   Fibromyalgia   IBS   Overweight  BMI is ~26   PVD s/p carotid artery stent   Loss of insurance - financial barriers to care  CSW to see before d/c home     Medication List    STOP taking these medications       atenolol 25 MG tablet  Commonly known as:  TENORMIN     clopidogrel 75 MG tablet  Commonly known as:  PLAVIX  pravastatin 20 MG tablet  Commonly known as:  PRAVACHOL      TAKE these medications       ALPRAZolam 0.25 MG tablet  Commonly known as:  XANAX  Take 1-2 tablets (0.25-0.5 mg total) by mouth 3 (three) times daily as needed for anxiety.     aspirin EC 81 MG tablet  Take 81 mg by mouth every morning.     carvedilol 6.25 MG tablet  Commonly known as:  COREG  Take 1 tablet (6.25 mg total) by mouth 2 (two) times daily with a meal.     Enalapril-Hydrochlorothiazide 5-12.5 MG per tablet  Take 1 tablet by mouth daily.     glimepiride 2 MG tablet  Commonly known as:  AMARYL  Take 1 tablet (2 mg total) by mouth daily before breakfast.     lovastatin 20 MG tablet  Commonly known as:  MEVACOR  Take 1 tablet (20 mg total) by mouth at bedtime.     nitroGLYCERIN 0.4 MG SL tablet   Commonly known as:  NITROSTAT  Place 1 tablet (0.4 mg total) under the tongue every 5 (five) minutes as needed for chest pain.     vitamin E 400 UNIT capsule  Generic drug:  vitamin E  Take 400 Units by mouth every morning.     zolpidem 10 MG tablet  Commonly known as:  AMBIEN  Take 10 mg by mouth at bedtime as needed. For neuropathy        Day of Discharge BP 142/67  Pulse 83  Temp(Src) 98.7 F (37.1 C) (Oral)  Resp 18  Ht 5\' 4"  (1.626 m)  Wt 67.6 kg (149 lb 0.5 oz)  BMI 25.57 kg/m2  SpO2 92%  Physical Exam: General: No acute respiratory distress Lungs: Clear to auscultation bilaterally without wheezes or crackles Cardiovascular: Regular rate and rhythm without murmur gallop or rub  Abdomen: Nontender, nondistended, soft, bowel sounds positive, no rebound, no ascites, no appreciable mass Extremities: No significant cyanosis, clubbing, or edema bilateral lower extremities  LIPID PANEL     Status: Abnormal   Collection Time    07/02/13  5:00 AM      Result Value Range   Cholesterol 199  0 - 200 mg/dL   Triglycerides 409 (*) <150 mg/dL   HDL 53  >81 mg/dL   Total CHOL/HDL Ratio 3.8     VLDL 44 (*) 0 - 40 mg/dL   LDL Cholesterol 191 (*) 0 - 99 mg/dL  GLUCOSE, CAPILLARY     Status: Abnormal   Collection Time    07/02/13  8:13 AM   Glucose-Capillary 112 (*) 70 - 99 mg/dL    Time spent in discharge (includes decision making & examination of pt): 30 minutes  07/02/2013, 11:27 AM   Shannon Blood, MD Shannon [name] Office  [phone] Pager [phone]  On-Call/Text Page:      [name]      password [facility]

## 2013-08-09 ENCOUNTER — Other Ambulatory Visit: Payer: Self-pay | Admitting: Cardiology

## 2013-08-10 ENCOUNTER — Other Ambulatory Visit: Payer: Self-pay

## 2013-08-10 MED ORDER — ATENOLOL 50 MG PO TABS: 50.0000 mg | ORAL_TABLET | Freq: Every day | ORAL | Status: DC

## 2013-08-25 ENCOUNTER — Other Ambulatory Visit: Payer: Self-pay | Admitting: Cardiology

## 2013-08-25 MED ORDER — NITROGLYCERIN 0.4 MG SL SUBL: 0.4000 mg | SUBLINGUAL_TABLET | SUBLINGUAL | Status: DC | PRN

## 2013-12-26 ENCOUNTER — Ambulatory Visit: Payer: Self-pay | Admitting: Internal Medicine

## 2013-12-30 ENCOUNTER — Ambulatory Visit: Payer: Self-pay

## 2014-01-02 ENCOUNTER — Ambulatory Visit: Payer: Self-pay

## 2014-01-11 ENCOUNTER — Ambulatory Visit: Payer: Medicaid Other

## 2014-02-09 ENCOUNTER — Other Ambulatory Visit: Payer: Self-pay | Admitting: Cardiology

## 2014-03-14 ENCOUNTER — Ambulatory Visit: Payer: Medicaid Other | Attending: Internal Medicine | Admitting: Internal Medicine

## 2014-03-14 ENCOUNTER — Encounter: Payer: Self-pay | Admitting: Internal Medicine

## 2014-03-14 VITALS — BP 182/64 | HR 72 | Temp 98.5°F | Resp 12 | Ht 64.0 in | Wt 157.0 lb

## 2014-03-14 DIAGNOSIS — Z79899 Other long term (current) drug therapy: Secondary | ICD-10-CM | POA: Insufficient documentation

## 2014-03-14 DIAGNOSIS — K589 Irritable bowel syndrome without diarrhea: Secondary | ICD-10-CM | POA: Insufficient documentation

## 2014-03-14 DIAGNOSIS — I252 Old myocardial infarction: Secondary | ICD-10-CM | POA: Insufficient documentation

## 2014-03-14 DIAGNOSIS — I10 Essential (primary) hypertension: Secondary | ICD-10-CM | POA: Insufficient documentation

## 2014-03-14 DIAGNOSIS — F329 Major depressive disorder, single episode, unspecified: Secondary | ICD-10-CM | POA: Insufficient documentation

## 2014-03-14 DIAGNOSIS — F3289 Other specified depressive episodes: Secondary | ICD-10-CM | POA: Insufficient documentation

## 2014-03-14 DIAGNOSIS — E1142 Type 2 diabetes mellitus with diabetic polyneuropathy: Secondary | ICD-10-CM | POA: Insufficient documentation

## 2014-03-14 DIAGNOSIS — IMO0001 Reserved for inherently not codable concepts without codable children: Secondary | ICD-10-CM | POA: Insufficient documentation

## 2014-03-14 DIAGNOSIS — Z8249 Family history of ischemic heart disease and other diseases of the circulatory system: Secondary | ICD-10-CM | POA: Insufficient documentation

## 2014-03-14 DIAGNOSIS — F411 Generalized anxiety disorder: Secondary | ICD-10-CM | POA: Insufficient documentation

## 2014-03-14 DIAGNOSIS — Z0000 Encounter for general adult medical examination without abnormal findings: Secondary | ICD-10-CM

## 2014-03-14 DIAGNOSIS — E1149 Type 2 diabetes mellitus with other diabetic neurological complication: Secondary | ICD-10-CM | POA: Insufficient documentation

## 2014-03-14 DIAGNOSIS — M129 Arthropathy, unspecified: Secondary | ICD-10-CM | POA: Insufficient documentation

## 2014-03-14 DIAGNOSIS — I2510 Atherosclerotic heart disease of native coronary artery without angina pectoris: Secondary | ICD-10-CM | POA: Insufficient documentation

## 2014-03-14 DIAGNOSIS — E119 Type 2 diabetes mellitus without complications: Secondary | ICD-10-CM | POA: Diagnosis not present

## 2014-03-14 DIAGNOSIS — Z7982 Long term (current) use of aspirin: Secondary | ICD-10-CM | POA: Insufficient documentation

## 2014-03-14 DIAGNOSIS — E1340 Other specified diabetes mellitus with diabetic neuropathy, unspecified: Secondary | ICD-10-CM

## 2014-03-14 DIAGNOSIS — G4700 Insomnia, unspecified: Secondary | ICD-10-CM | POA: Insufficient documentation

## 2014-03-14 DIAGNOSIS — E1349 Other specified diabetes mellitus with other diabetic neurological complication: Secondary | ICD-10-CM

## 2014-03-14 LAB — LIPID PANEL
CHOL/HDL RATIO: 4.5 ratio
CHOLESTEROL: 234 mg/dL — AB (ref 0–200)
HDL: 52 mg/dL (ref >39)
LDL Cholesterol: 154 mg/dL — ABNORMAL HIGH (ref 0–99)
TRIGLYCERIDES: 140 mg/dL (ref <150)
VLDL: 28 mg/dL (ref 0–40)

## 2014-03-14 LAB — CBC
HCT: 40.3 % (ref 36.0–46.0)
Hemoglobin: 13.9 g/dL (ref 12.0–15.0)
MCH: 28.3 pg (ref 26.0–34.0)
MCHC: 34.5 g/dL (ref 30.0–36.0)
MCV: 82.1 fL (ref 78.0–100.0)
PLATELETS: 181 10*3/uL (ref 150–400)
RBC: 4.91 MIL/uL (ref 3.87–5.11)
RDW: 14.1 % (ref 11.5–15.5)
WBC: 8 10*3/uL (ref 4.0–10.5)

## 2014-03-14 LAB — COMPLETE METABOLIC PANEL WITH GFR
ALK PHOS: 62 U/L (ref 39–117)
ALT: 15 U/L (ref 0–35)
AST: 17 U/L (ref 0–37)
Albumin: 4.1 g/dL (ref 3.5–5.2)
BILIRUBIN TOTAL: 0.5 mg/dL (ref 0.2–1.2)
BUN: 11 mg/dL (ref 6–23)
CALCIUM: 9.3 mg/dL (ref 8.4–10.5)
CO2: 27 mEq/L (ref 19–32)
CREATININE: 0.74 mg/dL (ref 0.50–1.10)
Chloride: 101 mEq/L (ref 96–112)
GFR, Est African American: >89 mL/min
GFR, Est Non African American: 86 mL/min
Glucose, Bld: 145 mg/dL — ABNORMAL HIGH (ref 70–99)
Potassium: 4.2 mEq/L (ref 3.5–5.3)
Sodium: 140 mEq/L (ref 135–145)
Total Protein: 6.1 g/dL (ref 6.0–8.3)

## 2014-03-14 LAB — POCT GLYCOSYLATED HEMOGLOBIN (HGB A1C): HEMOGLOBIN A1C: 6.9

## 2014-03-14 LAB — POCT CBG (FASTING - GLUCOSE)-MANUAL ENTRY: Glucose Fasting, POC: 176 mg/dL — AB (ref 70–99)

## 2014-03-14 MED ORDER — PRAVASTATIN SODIUM 20 MG PO TABS: 20.0000 mg | ORAL_TABLET | Freq: Every day | ORAL | Status: DC

## 2014-03-14 MED ORDER — CLOPIDOGREL BISULFATE 75 MG PO TABS: 75.0000 mg | ORAL_TABLET | Freq: Every day | ORAL | Status: DC

## 2014-03-14 MED ORDER — NITROGLYCERIN 0.4 MG SL SUBL: 0.4000 mg | SUBLINGUAL_TABLET | SUBLINGUAL | Status: DC | PRN

## 2014-03-14 MED ORDER — ATENOLOL 50 MG PO TABS: 50.0000 mg | ORAL_TABLET | Freq: Every day | ORAL | Status: DC

## 2014-03-14 MED ORDER — GABAPENTIN 100 MG PO CAPS: 100.0000 mg | ORAL_CAPSULE | Freq: Two times a day (BID) | ORAL | Status: DC

## 2014-03-14 MED ORDER — GLIMEPIRIDE 2 MG PO TABS: 2.0000 mg | ORAL_TABLET | Freq: Every day | ORAL | Status: DC

## 2014-03-14 MED ORDER — ISOSORBIDE DINITRATE 30 MG PO TABS: 30.0000 mg | ORAL_TABLET | Freq: Every morning | ORAL | Status: DC

## 2014-03-14 MED ORDER — HYDROXYZINE HCL 25 MG PO TABS: 25.0000 mg | ORAL_TABLET | Freq: Three times a day (TID) | ORAL | Status: DC | PRN

## 2014-03-14 NOTE — Patient Instructions (Addendum)
Will call with thyroid results and further instructions. Come back in 1 week for nurse visit to check blood pressure.  Will follow up with provider in one month, will reevaluate concerns at that time.  May begin taking Gabapentin twice daily to help with burning in feet.   Stress Stress-related medical problems are becoming increasingly common. The body has a built-in physical response to stressful situations. Faced with pressure, challenge or danger, we need to react quickly. Our bodies release hormones such as cortisol and adrenaline to help do this. These hormones are part of the "fight or flight" response and affect the metabolic rate, heart rate and blood pressure, resulting in a heightened, stressed state that prepares the body for optimum performance in dealing with a stressful situation. It is likely that early man required these mechanisms to stay alive, but usually modern stresses do not call for this, and the same hormones released in today's world can damage health and reduce coping ability. CAUSES  Pressure to perform at work, at school or in sports.  Threats of physical violence.  Money worries.  Arguments.  Family conflicts.  Divorce or separation from significant other.  Bereavement.  New job or unemployment.  Changes in location.  Alcohol or drug abuse. SOMETIMES, THERE IS NO PARTICULAR REASON FOR DEVELOPING STRESS. Almost all people are at risk of being stressed at some time in their lives. It is important to know that some stress is temporary and some is long term.  Temporary stress will go away when a situation is resolved. Most people can cope with short periods of stress, and it can often be relieved by relaxing, taking a walk, chatting through issues with friends, or having a good night's sleep.  Chronic (long-term, continuous) stress is much harder to deal with. It can be psychologically and emotionally damaging. It can be harmful both for an individual and for  friends and family. SYMPTOMS Everyone reacts to stress differently. There are some common effects that help us recognize it. In times of extreme stress, people may:  Shake uncontrollably.  Breathe faster and deeper than normal (hyperventilate).  Vomit.  For people with asthma, stress can trigger an attack.  For some people, stress may trigger migraine headaches, ulcers, and body pain. PHYSICAL EFFECTS OF STRESS MAY INCLUDE:  Loss of energy.  Skin problems.  Aches and pains resulting from tense muscles, including neck ache, backache and tension headaches.  Increased pain from arthritis and other conditions.  Irregular heart beat (palpitations).  Periods of irritability or anger.  Apathy or depression.  Anxiety (feeling uptight or worrying).  Unusual behavior.  Loss of appetite.  Comfort eating.  Lack of concentration.  Loss of, or decreased, sex-drive.  Increased smoking, drinking, or recreational drug use.  For women, missed periods.  Ulcers, joint pain, and muscle pain. Post-traumatic stress is the stress caused by any serious accident, strong emotional damage, or extremely difficult or violent experience such as rape or war. Post-traumatic stress victims can experience mixtures of emotions such as fear, shame, depression, guilt or anger. It may include recurrent memories or images that may be haunting. These feelings can last for weeks, months or even years after the traumatic event that triggered them. Specialized treatment, possibly with medicines and psychological therapies, is available. If stress is causing physical symptoms, severe distress or making it difficult for you to function as normal, it is worth seeing your caregiver. It is important to remember that although stress is a usual part of life,  extreme or prolonged stress can lead to other illnesses that will need treatment. It is better to visit a doctor sooner rather than later. Stress has been linked  to the development of high blood pressure and heart disease, as well as insomnia and depression. There is no diagnostic test for stress since everyone reacts to it differently. But a caregiver will be able to spot the physical symptoms, such as:  Headaches.  Shingles.  Ulcers. Emotional distress such as intense worry, low mood or irritability should be detected when the doctor asks pertinent questions to identify any underlying problems that might be the cause. In case there are physical reasons for the symptoms, the doctor may also want to do some tests to exclude certain conditions. If you feel that you are suffering from stress, try to identify the aspects of your life that are causing it. Sometimes you may not be able to change or avoid them, but even a small change can have a positive ripple effect. A simple lifestyle change can make all the difference. STRATEGIES THAT CAN HELP DEAL WITH STRESS:  Delegating or sharing responsibilities.  Avoiding confrontations.  Learning to be more assertive.  Regular exercise.  Avoid using alcohol or street drugs to cope.  Eating a healthy, balanced diet, rich in fruit and vegetables and proteins.  Finding humor or absurdity in stressful situations.  Never taking on more than you know you can handle comfortably.  Organizing your time better to get as much done as possible.  Talking to friends or family and sharing your thoughts and fears.  Listening to music or relaxation tapes.  Tensing and then relaxing your muscles, starting at the toes and working up to the head and neck. If you think that you would benefit from help, either in identifying the things that are causing your stress or in learning techniques to help you relax, see a caregiver who is capable of helping you with this. Rather than relying on medications, it is usually better to try and identify the things in your life that are causing stress and try to deal with them. There are  many techniques of managing stress including counseling, psychotherapy, aromatherapy, yoga, and exercise. Your caregiver can help you determine what is best for you. Document Released: 01/16/2003 Document Revised: 01/18/2012 Document Reviewed: 12/13/2007 [facility] Patient Information 2014 [place], [place]. DASH Diet The DASH diet stands for "Dietary Approaches to Stop Hypertension." It is a healthy eating plan that has been shown to reduce high blood pressure (hypertension) in as little as 14 days, while also possibly providing other significant health benefits. These other health benefits include reducing the risk of breast cancer after menopause and reducing the risk of type 2 diabetes, heart disease, colon cancer, and stroke. Health benefits also include weight loss and slowing kidney failure in patients with chronic kidney disease.  DIET GUIDELINES  Limit salt (sodium). Your diet should contain less than 1500 mg of sodium daily.  Limit refined or processed carbohydrates. Your diet should include mostly whole grains. Desserts and added sugars should be used sparingly.  Include small amounts of heart-healthy fats. These types of fats include nuts, oils, and tub margarine. Limit saturated and trans fats. These fats have been shown to be harmful in the body. CHOOSING FOODS  The following food groups are based on a 2000 calorie diet. See your Registered Dietitian for individual calorie needs. Grains and Grain Products (6 to 8 servings daily)  Eat More Often: Whole-wheat bread, brown rice, whole-grain  or wheat pasta, quinoa, popcorn without added fat or salt (air popped).  Eat Less Often: White bread, white pasta, white rice, cornbread. Vegetables (4 to 5 servings daily)  Eat More Often: Fresh, frozen, and canned vegetables. Vegetables may be raw, steamed, roasted, or grilled with a minimal amount of fat.  Eat Less Often/Avoid: Creamed or fried vegetables. Vegetables in a cheese sauce. Fruit (4  to 5 servings daily)  Eat More Often: All fresh, canned (in natural juice), or frozen fruits. Dried fruits without added sugar. One hundred percent fruit juice ( cup [237 mL] daily).  Eat Less Often: Dried fruits with added sugar. Canned fruit in light or heavy syrup. [employer], Fish, and Poultry (2 servings or less daily. One serving is 3 to 4 oz [85-114 g]).  Eat More Often: Ninety percent or leaner ground beef, tenderloin, sirloin. Round cuts of beef, chicken breast, [place] breast. All fish. Grill, bake, or broil your meat. Nothing should be fried.  Eat Less Often/Avoid: Fatty cuts of meat, [place], or chicken leg, thigh, or wing. Fried cuts of meat or fish. Dairy (2 to 3 servings)  Eat More Often: Low-fat or fat-free milk, low-fat plain or light yogurt, reduced-fat or part-skim cheese.  Eat Less Often/Avoid: Milk (whole, 2%).Whole milk yogurt. Full-fat cheeses. Nuts, Seeds, and Legumes (4 to 5 servings per week)  Eat More Often: All without added salt.  Eat Less Often/Avoid: Salted nuts and seeds, canned beans with added salt. Fats and Sweets (limited)  Eat More Often: Vegetable oils, tub margarines without trans fats, sugar-free gelatin. Mayonnaise and salad dressings.  Eat Less Often/Avoid: Coconut oils, palm oils, butter, stick margarine, cream, half and half, cookies, candy, pie. FOR MORE INFORMATION The Dash Diet Eating Plan: www.dashdiet.org Document Released: 10/15/2011 Document Revised: 01/18/2012 Document Reviewed: 10/15/2011 [facility] Patient Information 2014 [place], [place].

## 2014-03-14 NOTE — Progress Notes (Signed)
Patient here to establish care for multiple health issues. Patient states she needs med refills Patient referred to social worker for PHQ-9 score of 13. Provider aware.

## 2014-03-15 ENCOUNTER — Other Ambulatory Visit: Payer: Self-pay | Admitting: Internal Medicine

## 2014-03-15 LAB — T4, FREE: Free T4: 0.83 ng/dL (ref 0.80–1.80)

## 2014-03-15 LAB — TSH: TSH: 5.167 u[IU]/mL — ABNORMAL HIGH (ref 0.350–4.500)

## 2014-03-15 NOTE — Progress Notes (Signed)
Patient ID: Shannon PeachEsther [name], female   DOB: 01/07/1951, 63 y.o.   MRN: [id number]   [id number]  [id number]  DOB - 11/13/1950  CC:  Chief Complaint  Patient presents with  . Establish Care  . Hypertension  . Diabetes  . Insomnia  . Anxiety       HPI: Shannon [name] Shannon [name] is a 63 y.o. female with an extensive past medical history, here today to establish medical care. Patient is here for management of her diabetes and hypertension patient reports that she has been maintaining her medication prescriptions from old refills in the emergency department. Patient reports that she has been under increased stress due to numerous family issues and has suffered from heart palpitations, arm pain, where vision, dizziness, and swelling in her legs. Patient needs medication refills and blood work. Today she states she has anxiety and insomnia for the past year. She reports less than hours sleep per night.   Allergies  Allergen Reactions  . Sulfa Antibiotics Other (See Comments)    Childhood allergy   Past Medical History  Diagnosis Date  . Hypertension   . Coronary artery disease   . Diabetes mellitus without complication   . Arthritis   . Fibromyalgia   . Irritable bowel   . Anxiety   . Depression   . Fibromyalgia 1995   Current Outpatient Prescriptions on File Prior to Visit  Medication Sig Dispense Refill  . ALPRAZolam (XANAX) 0.25 MG tablet Take 1-2 tablets (0.25-0.5 mg total) by mouth 3 (three) times daily as needed for anxiety.  30 tablet  0  . aspirin EC 81 MG tablet Take 81 mg by mouth every morning.      . vitamin E (VITAMIN E) 400 UNIT capsule Take 400 Units by mouth every morning.       No current facility-administered medications on file prior to visit.   Family History  Problem Relation Age of Onset  . Cancer Mother   . Diabetes Mother   . Hypertension Mother   . Heart failure Mother   . Cancer Father   . Diabetes Father   . Hypertension Father   . Heart failure Father    . CAD Father   . CAD Mother   . CAD Brother   . CAD Sister    History   Social History  . Marital Status: Married    Spouse Name: N/A    Number of Children: N/A  . Years of Education: N/A   Occupational History  . Not on file.   Social History Main Topics  . Smoking status: Never Smoker   . Smokeless tobacco: Not on file  . Alcohol Use: No  . Drug Use: No  . Sexual Activity: Not on file   Other Topics Concern  . Not on file   Social History Narrative  . No narrative on file   Review of Systems  Constitutional: Negative for fever, chills and weight loss.  Eyes: Positive for blurred vision.  Respiratory: Negative for cough and shortness of breath.   Cardiovascular: Positive for palpitations and leg swelling. Negative for claudication.  Gastrointestinal: Negative.   Genitourinary: Negative.   Musculoskeletal: Negative.   Skin: Negative.   Neurological: Positive for dizziness, tingling (BLE extremeties) and headaches. Negative for seizures.  Endo/Heme/Allergies: Negative.   Psychiatric/Behavioral: Positive for depression. The patient is nervous/anxious and has insomnia.     Objective:   Filed Vitals:   03/14/14 1450  BP: 182/64  Pulse: 72  Temp: 98.5 F (  36.9 C)  Resp: 12    Physical Exam: Constitutional: Patient appears well-developed and well-nourished. No distress. HENT: Normocephalic, atraumatic, External right and left ear normal. Oropharynx is clear and moist.  Eyes: Conjunctivae and EOM are normal. PERRLA, no scleral icterus. Neck: Normal ROM. Neck supple. No JVD. No tracheal deviation. Enlarged thyroid--no nodules noted CVS: RRR, S1/S2 +, no murmurs, no gallops, no carotid bruit.  Pulmonary: Effort and breath sounds normal, no stridor, rhonchi, wheezes, rales.  Abdominal: Soft. BS +, no distension, tenderness, rebound or guarding.  Musculoskeletal: Normal range of motion. No edema and no tenderness.  Lymphadenopathy: No lymphadenopathy noted,  cervical Neuro: Alert. Normal reflexes, muscle tone coordination. No cranial nerve deficit. Skin: Skin is warm and dry. No rash noted. Not diaphoretic. No erythema. No pallor. Psychiatric: Normal mood and affect. Behavior, judgment, thought content normal.\  the feet reveals normal posterior tibial and dorsalis pedis pulses. Skin to palpation and inspection is intact, without lesions or corns. No discoloration is present. Monofilament nylon test reveals normal sensation bilaterally over plantar surfaces of distal great toe, first, third, and fifth metatarsal heads. Brisk capillary refill    Lab Results  Component Value Date   WBC 8.0 03/14/2014   HGB 13.9 03/14/2014   HCT 40.3 03/14/2014   MCV 82.1 03/14/2014   PLT 181 03/14/2014   Lab Results  Component Value Date   CREATININE 0.74 03/14/2014   BUN 11 03/14/2014   NA 140 03/14/2014   K 4.2 03/14/2014   CL 101 03/14/2014   CO2 27 03/14/2014    Lab Results  Component Value Date   HGBA1C 6.9 03/14/2014   Lipid Panel     Component Value Date/Time   CHOL 234* 03/14/2014 1554   TRIG 140 03/14/2014 1554   HDL 52 03/14/2014 1554   CHOLHDL 4.5 03/14/2014 1554   VLDL 28 03/14/2014 1554   LDLCALC 154* 03/14/2014 1554       Assessment and plan:   Shannon [name] was seen today for establish care, hypertension, diabetes, insomnia and anxiety.  Diagnoses and associated orders for this visit:  Diabetes - Glucose (CBG), Fasting - HgB A1c Continue current medication regimen.  - hydrOXYzine (ATARAX/VISTARIL) 25 MG tablet; Take 1 tablet (25 mg total) by mouth 3 (three) times daily as needed for itching. - glimepiride (AMARYL) 2 MG tablet; Take 1 tablet (2 mg total) by mouth daily before breakfast.  Preventative health care - TSH - T4, Free - CBC - COMPLETE METABOLIC PANEL WITH GFR - Lipid panel  History of MI (myocardial infarction) - Ambulatory referral to Cardiology.  May continue current medication regimen. - clopidogrel (PLAVIX) 75 MG tablet; Take 1 tablet (75  mg total) by mouth daily with breakfast. - nitroGLYCERIN (NITROSTAT) 0.4 MG SL tablet; Place 1 tablet (0.4 mg total) under the tongue every 5 (five) minutes as needed for chest pain. - isosorbide dinitrate (ISORDIL) 30 MG tablet; Take 1 tablet (30 mg total) by mouth every morning. - pravastatin (PRAVACHOL) 20 MG tablet; Take 1 tablet (20 mg total) by mouth daily.  HTN (hypertension) - atenolol (TENORMIN) 50 MG tablet; Take 1 tablet (50 mg total) by mouth daily. Patient will return in one week for nurse visit for BP check.   Neuropathy due to secondary diabetes May continue current medication regimen - gabapentin (NEURONTIN) 100 MG capsule; Take 1 capsule (100 mg total) by mouth 2 (two) times daily.  Generalized anxiety disorder - Ambulatory referral to Psychiatry   Return in about 1 week (around  03/21/2014) for Nurse Visit-BP check, 1 month with PCP.  The patient was given clear instructions to go to ER or return to medical center if symptoms don't improve, worsen or new problems develop. The patient verbalized understanding. The patient was told to call to get lab results if they haven't heard anything in the next week.     Holland CommonsValerie Lajuana Patchell, NP-C [facility] [phone] 03/15/2014, 3:11 PM

## 2014-03-16 ENCOUNTER — Telehealth: Payer: Self-pay | Admitting: *Deleted

## 2014-03-16 ENCOUNTER — Other Ambulatory Visit: Payer: Self-pay | Admitting: Internal Medicine

## 2014-03-16 DIAGNOSIS — E039 Hypothyroidism, unspecified: Secondary | ICD-10-CM

## 2014-03-16 MED ORDER — LEVOTHYROXINE SODIUM 25 MCG PO TABS: 25.0000 ug | ORAL_TABLET | Freq: Every day | ORAL | Status: DC

## 2014-03-16 NOTE — Telephone Encounter (Signed)
Message copied by Tayli Buch, Arman FilterJAMIE R on Fri Mar 16, 2014  3:12 PM ------      Message from: Holland CommonsKECK, VALERIE A      Created: Thu Mar 15, 2014  9:32 PM       Please call this patient and ask her to tell you what dosage and how many times er day she is taking atenolol, imdur (make sure it says isosorbide mononitrate and not dinitrate), and glimepiride. The pharmacy called me and said she was taking different doses of the med due to old prescriptions. I need to know so that I can change the Rx if needed. If she cannot tell you ask her to bring in the last prescriptions she had refilled a few days before she came to see me when she comes for her nurse visit next week. Thanks  ------

## 2014-03-16 NOTE — Telephone Encounter (Signed)
Left message

## 2014-03-16 NOTE — Telephone Encounter (Signed)
Message copied by Raynelle CharyWINFREE, Caitlan Chauca R on Fri Mar 16, 2014 12:24 PM ------      Message from: Holland CommonsKECK, VALERIE A      Created: Fri Mar 16, 2014 10:57 AM       Let patient know her cholesterol is high ans she needs to continue with the medication, diet, and exercise. Remind patient of foods that increase cholesterol.  Also let her know that her TSH was elevated and she will need to begin taking synthroid 25 mcg daily--30 minutes before meals and other medications. Needs a scheduled appointment to repeat TSH in 6 weeks. I have already placed the lab order and Rx is at pharmacy on file. Thanks ------

## 2014-03-16 NOTE — Telephone Encounter (Signed)
Left a message for patient to return call. Reather LaurenceJamie R Rheagan Nayak, RN

## 2014-03-21 ENCOUNTER — Ambulatory Visit (HOSPITAL_BASED_OUTPATIENT_CLINIC_OR_DEPARTMENT_OTHER): Payer: Medicaid Other | Admitting: *Deleted

## 2014-03-21 ENCOUNTER — Ambulatory Visit: Payer: Medicaid Other | Attending: Cardiology | Admitting: Cardiology

## 2014-03-21 ENCOUNTER — Ambulatory Visit: Payer: Medicaid Other | Attending: Internal Medicine | Admitting: *Deleted

## 2014-03-21 VITALS — BP 145/91 | HR 83 | Temp 98.3°F | Resp 14

## 2014-03-21 DIAGNOSIS — I10 Essential (primary) hypertension: Secondary | ICD-10-CM

## 2014-03-21 DIAGNOSIS — F4323 Adjustment disorder with mixed anxiety and depressed mood: Secondary | ICD-10-CM

## 2014-03-21 NOTE — Progress Notes (Signed)
LCSW met with patient who deals with high levels of anxiety and some depressed mood.  Patient states that she has been assessed and saw a psychiatrist who prescribed her with an anxiety medication, that patient states is ineffective.  LCSW recommended that patient contact [facility] in order to identify an agency that can do both psychotherapy and medication management.  Patient stated that she would like to return to this LCSW for opportunities to check in.  Patient will return in about 2 weeks.   Christene Lye MSW, LCSW Duration 30 minutes

## 2014-03-21 NOTE — Patient Instructions (Signed)
Fat and Cholesterol Control Diet Fat and cholesterol levels in your blood and organs are influenced by your diet. High levels of fat and cholesterol may lead to diseases of the heart, small and large blood vessels, gallbladder, liver, and pancreas. CONTROLLING FAT AND CHOLESTEROL WITH DIET Although exercise and lifestyle factors are important, your diet is key. That is because certain foods are known to raise cholesterol and others to lower it. The goal is to balance foods for their effect on cholesterol and more importantly, to replace saturated and trans fat with other types of fat, such as monounsaturated fat, polyunsaturated fat, and omega-3 fatty acids. On average, a person should consume no more than 15 to 17 g of saturated fat daily. Saturated and trans fats are considered "bad" fats, and they will raise LDL cholesterol. Saturated fats are primarily found in animal products such as meats, butter, and cream. However, that does not mean you need to give up all your favorite foods. Today, there are good tasting, low-fat, low-cholesterol substitutes for most of the things you like to eat. Choose low-fat or nonfat alternatives. Choose round or loin cuts of red meat. These types of cuts are lowest in fat and cholesterol. Chicken (without the skin), fish, veal, and ground turkey breast are great choices. Eliminate fatty meats, such as hot dogs and salami. Even shellfish have little or no saturated fat. Have a 3 oz (85 g) portion when you eat lean meat, poultry, or fish. Trans fats are also called "partially hydrogenated oils." They are oils that have been scientifically manipulated so that they are solid at room temperature resulting in a longer shelf life and improved taste and texture of foods in which they are added. Trans fats are found in stick margarine, some tub margarines, cookies, crackers, and baked goods.  When baking and cooking, oils are a great substitute for butter. The monounsaturated oils are  especially beneficial since it is believed they lower LDL and raise HDL. The oils you should avoid entirely are saturated tropical oils, such as coconut and palm.  Remember to eat a lot from food groups that are naturally free of saturated and trans fat, including fish, fruit, vegetables, beans, grains (barley, rice, couscous, bulgur wheat), and pasta (without cream sauces).  IDENTIFYING FOODS THAT LOWER FAT AND CHOLESTEROL  Soluble fiber may lower your cholesterol. This type of fiber is found in fruits such as apples, vegetables such as broccoli, potatoes, and carrots, legumes such as beans, peas, and lentils, and grains such as barley. Foods fortified with plant sterols (phytosterol) may also lower cholesterol. You should eat at least 2 g per day of these foods for a cholesterol lowering effect.  Read package labels to identify low-saturated fats, trans fat free, and low-fat foods at the supermarket. Select cheeses that have only 2 to 3 g saturated fat per ounce. Use a heart-healthy tub margarine that is free of trans fats or partially hydrogenated oil. When buying baked goods (cookies, crackers), avoid partially hydrogenated oils. Breads and muffins should be made from whole grains (whole-wheat or whole oat flour, instead of "flour" or "enriched flour"). Buy non-creamy canned soups with reduced salt and no added fats.  FOOD PREPARATION TECHNIQUES  Never deep-fry. If you must fry, either stir-fry, which uses very little fat, or use non-stick cooking sprays. When possible, broil, bake, or roast meats, and steam vegetables. Instead of putting butter or margarine on vegetables, use lemon and herbs, applesauce, and cinnamon (for squash and sweet potatoes). Use nonfat   yogurt, salsa, and low-fat dressings for salads.  LOW-SATURATED FAT / LOW-FAT FOOD SUBSTITUTES Meats / Saturated Fat (g)  Avoid: Steak, marbled (3 oz/85 g) / 11 g  Choose: Steak, lean (3 oz/85 g) / 4 g  Avoid: Hamburger (3 oz/85 g) / 7  g  Choose: Hamburger, lean (3 oz/85 g) / 5 g  Avoid: Ham (3 oz/85 g) / 6 g  Choose: Ham, lean cut (3 oz/85 g) / 2.4 g  Avoid: Chicken, with skin, dark meat (3 oz/85 g) / 4 g  Choose: Chicken, skin removed, dark meat (3 oz/85 g) / 2 g  Avoid: Chicken, with skin, light meat (3 oz/85 g) / 2.5 g  Choose: Chicken, skin removed, light meat (3 oz/85 g) / 1 g Dairy / Saturated Fat (g)  Avoid: Whole milk (1 cup) / 5 g  Choose: Low-fat milk, 2% (1 cup) / 3 g  Choose: Low-fat milk, 1% (1 cup) / 1.5 g  Choose: Skim milk (1 cup) / 0.3 g  Avoid: Hard cheese (1 oz/28 g) / 6 g  Choose: Skim milk cheese (1 oz/28 g) / 2 to 3 g  Avoid: Cottage cheese, 4% fat (1 cup) / 6.5 g  Choose: Low-fat cottage cheese, 1% fat (1 cup) / 1.5 g  Avoid: Ice cream (1 cup) / 9 g  Choose: Sherbet (1 cup) / 2.5 g  Choose: Nonfat frozen yogurt (1 cup) / 0.3 g  Choose: Frozen fruit bar / trace  Avoid: Whipped cream (1 tbs) / 3.5 g  Choose: Nondairy whipped topping (1 tbs) / 1 g Condiments / Saturated Fat (g)  Avoid: Mayonnaise (1 tbs) / 2 g  Choose: Low-fat mayonnaise (1 tbs) / 1 g  Avoid: Butter (1 tbs) / 7 g  Choose: Extra light margarine (1 tbs) / 1 g  Avoid: Coconut oil (1 tbs) / 11.8 g  Choose: Olive oil (1 tbs) / 1.8 g  Choose: Corn oil (1 tbs) / 1.7 g  Choose: Safflower oil (1 tbs) / 1.2 g  Choose: Sunflower oil (1 tbs) / 1.4 g  Choose: Soybean oil (1 tbs) / 2.4 g  Choose: Canola oil (1 tbs) / 1 g Document Released: 10/26/2005 Document Revised: 02/20/2013 Document Reviewed: 04/16/2011 ExitCare Patient Information 2014 [place]. Hypothyroidism The thyroid is a large gland located in the lower front of your neck. The thyroid gland helps control metabolism. Metabolism is how your body handles food. It controls metabolism with the hormone thyroxine. When this gland is underactive (hypothyroid), it produces too little hormone.  CAUSES These include:   Absence or destruction  of thyroid tissue.  Goiter due to iodine deficiency.  Goiter due to medications.  Congenital defects (since birth).  Problems with the pituitary. This causes a lack of TSH (thyroid stimulating hormone). This hormone tells the thyroid to turn out more hormone. SYMPTOMS  Lethargy (feeling as though you have no energy)  Cold intolerance  Weight gain (in spite of normal food intake)  Dry skin  Coarse hair  Menstrual irregularity (if severe, may lead to infertility)  Slowing of thought processes Cardiac problems are also caused by insufficient amounts of thyroid hormone. Hypothyroidism in the newborn is cretinism, and is an extreme form. It is important that this form be treated adequately and immediately or it will lead rapidly to retarded physical and mental development. DIAGNOSIS  To prove hypothyroidism, your caregiver may do blood tests and ultrasound tests. Sometimes the signs are hidden. It may be necessary for  your caregiver to watch this illness with blood tests either before or after diagnosis and treatment. TREATMENT  Low levels of thyroid hormone are increased by using synthetic thyroid hormone. This is a safe, effective treatment. It usually takes about four weeks to gain the full effects of the medication. After you have the full effect of the medication, it will generally take another four weeks for problems to leave. Your caregiver may start you on low doses. If you have had heart problems the dose may be gradually increased. It is generally not an emergency to get rapidly to normal. HOME CARE INSTRUCTIONS   Take your medications as your caregiver suggests. Let your caregiver know of any medications you are taking or start taking. Your caregiver will help you with dosage schedules.  As your condition improves, your dosage needs may increase. It will be necessary to have continuing blood tests as suggested by your caregiver.  Report all suspected medication side effects  to your caregiver. SEEK MEDICAL CARE IF: Seek medical care if you develop:  Sweating.  Tremulousness (tremors).  Anxiety.  Rapid weight loss.  Heat intolerance.  Emotional swings.  Diarrhea.  Weakness. SEEK IMMEDIATE MEDICAL CARE IF:  You develop chest pain, an irregular heart beat (palpitations), or a rapid heart beat. MAKE SURE YOU:   Understand these instructions.  Will watch your condition.  Will get help right away if you are not doing well or get worse. Document Released: 10/26/2005 Document Revised: 01/18/2012 Document Reviewed: 06/15/2008 [facility] Patient Information 2014 [place], [place].

## 2014-03-21 NOTE — Progress Notes (Signed)
Patient here today for Blood Pressure recheck. Also informed patienther cholesterol is high ans she needs to continue with the medication, diet, and exercise. Reminded patient of foods that are  increase cholesterol. Also let her know that her TSH was elevated and she will need to begin taking synthroid 25 mcg daily--30 minutes before meals and other medications. Needs a scheduled appointment to repeat TSH in 6 weeks. I have already placed the lab order and Rx is at pharmacy on file. Patient states she would like to have lab redrawn when she comes back 04/13/2014.Reather LaurenceJamie R Morrison Masser, RN

## 2014-03-22 ENCOUNTER — Ambulatory Visit: Payer: Medicaid Other | Admitting: Cardiology

## 2014-03-28 ENCOUNTER — Telehealth: Payer: Self-pay | Admitting: *Deleted

## 2014-03-28 NOTE — Telephone Encounter (Signed)
Patient notified of lab results and provider instructions. Patient verbalizes understanding.

## 2014-03-28 NOTE — Telephone Encounter (Signed)
Message copied by Fredderick SeveranceUCATTE, LAURENZE L on Wed Mar 28, 2014 10:35 AM ------      Message from: Holland CommonsKECK, VALERIE A      Created: Fri Mar 16, 2014 10:57 AM       Let patient know her cholesterol is high ans she needs to continue with the medication, diet, and exercise. Remind patient of foods that increase cholesterol.  Also let her know that her TSH was elevated and she will need to begin taking synthroid 25 mcg daily--30 minutes before meals and other medications. Needs a scheduled appointment to repeat TSH in 6 weeks. I have already placed the lab order and Rx is at pharmacy on file. Thanks ------

## 2014-03-28 NOTE — Progress Notes (Signed)
Spoke with patient and relayed lab results and instructions from provider. Patient states that she has been taking the levothyroxine for 1 one week. Patient verbalizes understanding.

## 2014-04-13 ENCOUNTER — Ambulatory Visit: Payer: Medicaid Other | Attending: Internal Medicine | Admitting: Internal Medicine

## 2014-04-13 ENCOUNTER — Encounter: Payer: Self-pay | Admitting: Internal Medicine

## 2014-04-13 ENCOUNTER — Ambulatory Visit: Payer: Medicaid Other | Attending: Internal Medicine | Admitting: *Deleted

## 2014-04-13 VITALS — BP 180/84 | HR 56 | Temp 98.2°F | Resp 20 | Ht 64.0 in | Wt 157.0 lb

## 2014-04-13 DIAGNOSIS — R350 Frequency of micturition: Secondary | ICD-10-CM | POA: Diagnosis not present

## 2014-04-13 DIAGNOSIS — F411 Generalized anxiety disorder: Secondary | ICD-10-CM | POA: Diagnosis not present

## 2014-04-13 DIAGNOSIS — Z595 Extreme poverty: Secondary | ICD-10-CM

## 2014-04-13 DIAGNOSIS — Z7982 Long term (current) use of aspirin: Secondary | ICD-10-CM | POA: Insufficient documentation

## 2014-04-13 DIAGNOSIS — L259 Unspecified contact dermatitis, unspecified cause: Secondary | ICD-10-CM | POA: Insufficient documentation

## 2014-04-13 DIAGNOSIS — E119 Type 2 diabetes mellitus without complications: Secondary | ICD-10-CM | POA: Diagnosis not present

## 2014-04-13 DIAGNOSIS — I10 Essential (primary) hypertension: Secondary | ICD-10-CM | POA: Diagnosis not present

## 2014-04-13 DIAGNOSIS — IMO0001 Reserved for inherently not codable concepts without codable children: Secondary | ICD-10-CM

## 2014-04-13 DIAGNOSIS — K219 Gastro-esophageal reflux disease without esophagitis: Secondary | ICD-10-CM

## 2014-04-13 LAB — POCT URINALYSIS DIPSTICK
Bilirubin, UA: NEGATIVE
GLUCOSE UA: NEGATIVE
Ketones, UA: NEGATIVE
LEUKOCYTES UA: NEGATIVE
NITRITE UA: NEGATIVE
Protein, UA: NEGATIVE
RBC UA: NEGATIVE
Spec Grav, UA: 1.01
UROBILINOGEN UA: 0.2
pH, UA: 6

## 2014-04-13 MED ORDER — OMEPRAZOLE 20 MG PO CPDR: 20.0000 mg | DELAYED_RELEASE_CAPSULE | Freq: Every day | ORAL | Status: DC

## 2014-04-13 MED ORDER — METFORMIN HCL 500 MG PO TABS: 500.0000 mg | ORAL_TABLET | Freq: Two times a day (BID) | ORAL | Status: DC

## 2014-04-13 MED ORDER — METOPROLOL TARTRATE 50 MG PO TABS: 50.0000 mg | ORAL_TABLET | Freq: Two times a day (BID) | ORAL | Status: DC

## 2014-04-13 MED ORDER — TRIAMCINOLONE ACETONIDE 0.1 % EX CREA: 1.0000 "application " | TOPICAL_CREAM | Freq: Two times a day (BID) | CUTANEOUS | Status: DC

## 2014-04-13 NOTE — Progress Notes (Signed)
Patient here for follow-up of hypertension. BP elevated today 180/84 Patient denies chest pain, headache, or dizziness today, but indicates she occasionally has these symptoms. Complaining of blurry vision-20/50 in left eye, 20/50 right eye, 20/50 in both eyes. Feels blurriness is worse after taking atenolol

## 2014-04-13 NOTE — Progress Notes (Signed)
LCSW met with patient to follow up on recommendation to see psychiatrist for further support.  Patient has connected with psychiatrist in order to initiate treatment and they started the patient on Risperadone and will follow up with counseling and further emotional support.  Christene Lye MSW, LCSW

## 2014-04-13 NOTE — Progress Notes (Signed)
Patient ID: Shannon PeachEsther [name], female   DOB: 09/18/1951, 63 y.o.   MRN: [id number]  CC: f/u  HPI:  Patient reports that she checks her blood pressure at home and it is always over 200 systolically.  She reports that she has been having a increase in blurred vision and believes it is caused by the atenolol.  Feels like she has to urinate more at night, around 4 times per night. Urinary pressure before voiding. Having more mucous in stools.  Has rash on right medial knee that started 3 weeks ago.  States no new exposures, hygiene products, or laundry detergents.   Patient states that she has not noticed any difference in energy since beginning synthroid.    Allergies  Allergen Reactions  . Sulfa Antibiotics Other (See Comments)    Childhood allergy   Past Medical History  Diagnosis Date  . Hypertension   . Coronary artery disease   . Diabetes mellitus without complication   . Arthritis   . Fibromyalgia   . Irritable bowel   . Anxiety   . Depression   . Fibromyalgia 1995   Current Outpatient Prescriptions on File Prior to Visit  Medication Sig Dispense Refill  . ALPRAZolam (XANAX) 0.25 MG tablet Take 1-2 tablets (0.25-0.5 mg total) by mouth 3 (three) times daily as needed for anxiety.  30 tablet  0  . aspirin EC 81 MG tablet Take 81 mg by mouth every morning.      Marland Kitchen. atenolol (TENORMIN) 50 MG tablet Take 1 tablet (50 mg total) by mouth daily.  30 tablet  2  . clopidogrel (PLAVIX) 75 MG tablet Take 1 tablet (75 mg total) by mouth daily with breakfast.  30 tablet  0  . gabapentin (NEURONTIN) 100 MG capsule Take 1 capsule (100 mg total) by mouth 2 (two) times daily.  60 capsule  1  . glimepiride (AMARYL) 2 MG tablet Take 1 tablet (2 mg total) by mouth daily before breakfast.  30 tablet  0  . hydrOXYzine (ATARAX/VISTARIL) 25 MG tablet Take 1 tablet (25 mg total) by mouth 3 (three) times daily as needed for itching.  30 tablet  1  . isosorbide dinitrate (ISORDIL) 30 MG tablet Take 1 tablet (30  mg total) by mouth every morning.  30 tablet  2  . levothyroxine (LEVOTHROID) 25 MCG tablet Take 1 tablet (25 mcg total) by mouth daily before breakfast.  60 tablet  0  . nitroGLYCERIN (NITROSTAT) 0.4 MG SL tablet Place 1 tablet (0.4 mg total) under the tongue every 5 (five) minutes as needed for chest pain.  25 tablet  0  . pravastatin (PRAVACHOL) 20 MG tablet Take 1 tablet (20 mg total) by mouth daily.  30 tablet  2  . vitamin E (VITAMIN E) 400 UNIT capsule Take 400 Units by mouth every morning.       No current facility-administered medications on file prior to visit.   Family History  Problem Relation Age of Onset  . Cancer Mother   . Diabetes Mother   . Hypertension Mother   . Heart failure Mother   . Cancer Father   . Diabetes Father   . Hypertension Father   . Heart failure Father   . CAD Father   . CAD Mother   . CAD Brother   . CAD Sister    History   Social History  . Marital Status: Married    Spouse Name: N/A    Number of Children: N/A  .  Years of Education: N/A   Occupational History  . Not on file.   Social History Main Topics  . Smoking status: Never Smoker   . Smokeless tobacco: Not on file  . Alcohol Use: No  . Drug Use: No  . Sexual Activity: Not on file   Other Topics Concern  . Not on file   Social History Narrative  . No narrative on file    Review of Systems: See HPI   Objective:   Filed Vitals:   04/13/14 1621  BP: 180/84  Pulse: 56  Temp: 98.2 F (36.8 C)  Resp: 20   Physical Exam  Constitutional: She is oriented to person, place, and time.  Cardiovascular: Normal rate, regular rhythm and normal heart sounds.   Pulmonary/Chest: Effort normal and breath sounds normal.  Abdominal: Soft. Bowel sounds are normal.  Musculoskeletal: Normal range of motion. She exhibits no edema and no tenderness.  Neurological: She is alert and oriented to person, place, and time.  Skin: Skin is warm and dry. Rash (erythmateous rash on right  medial knee) noted.  Psychiatric: She has a normal mood and affect. Thought content normal.  '.  Lab Results  Component Value Date   WBC 8.0 03/14/2014   HGB 13.9 03/14/2014   HCT 40.3 03/14/2014   MCV 82.1 03/14/2014   PLT 181 03/14/2014   Lab Results  Component Value Date   CREATININE 0.74 03/14/2014   BUN 11 03/14/2014   NA 140 03/14/2014   K 4.2 03/14/2014   CL 101 03/14/2014   CO2 27 03/14/2014    Lab Results  Component Value Date   HGBA1C 6.9 03/14/2014   Lipid Panel     Component Value Date/Time   CHOL 234* 03/14/2014 1554   TRIG 140 03/14/2014 1554   HDL 52 03/14/2014 1554   CHOLHDL 4.5 03/14/2014 1554   VLDL 28 03/14/2014 1554   LDLCALC 154* 03/14/2014 1554       Assessment and plan:   Shannon [name] was seen today for hypertension and follow-up.  Diagnoses and associated orders for this visit:  Diabetes - Switch to metFORMIN (GLUCOPHAGE) 500 MG tablet; Take 1 tablet (500 mg total) by mouth 2 (two) times daily with a meal. Discontinue amaryl.  Acid reflux - omeprazole (PRILOSEC) 20 MG capsule; Take 1 capsule (20 mg total) by mouth daily. For acid reflux  Contact dermatitis - triamcinolone cream (KENALOG) 0.1 %; Apply 1 application topically 2 (two) times daily. Do not apply to face  HTN (hypertension) - metoprolol (LOPRESSOR) 50 MG tablet; Take 1 tablet (50 mg total) by mouth 2 (two) times daily. For blood pressure  Urine frequency - POCT urinalysis dipstick-Negative - Ambulatory referral to Urology  Patient will return in 3 weeks for a repeat TSH and 2 months for f/u visit.      Ambrose Finland, NP-C [facility] [phone] 04/17/2014, 9:59 AM

## 2014-04-24 ENCOUNTER — Ambulatory Visit: Payer: Self-pay | Admitting: Internal Medicine

## 2014-05-04 ENCOUNTER — Other Ambulatory Visit: Payer: Medicaid Other

## 2014-05-08 ENCOUNTER — Other Ambulatory Visit: Payer: Self-pay | Admitting: Internal Medicine

## 2014-05-09 ENCOUNTER — Other Ambulatory Visit: Payer: Self-pay

## 2014-05-09 MED ORDER — METOPROLOL TARTRATE 50 MG PO TABS: 50.0000 mg | ORAL_TABLET | Freq: Two times a day (BID) | ORAL | Status: DC

## 2014-05-16 ENCOUNTER — Other Ambulatory Visit: Payer: Self-pay | Admitting: Internal Medicine

## 2014-05-16 ENCOUNTER — Encounter: Payer: Self-pay | Admitting: *Deleted

## 2014-05-16 ENCOUNTER — Ambulatory Visit (INDEPENDENT_AMBULATORY_CARE_PROVIDER_SITE_OTHER): Payer: Medicare Other | Admitting: Cardiology

## 2014-05-16 ENCOUNTER — Encounter: Payer: Self-pay | Admitting: Cardiology

## 2014-05-16 VITALS — BP 150/100 | HR 64 | Ht 64.0 in | Wt 160.0 lb

## 2014-05-16 DIAGNOSIS — R0789 Other chest pain: Secondary | ICD-10-CM | POA: Diagnosis not present

## 2014-05-16 DIAGNOSIS — I2510 Atherosclerotic heart disease of native coronary artery without angina pectoris: Secondary | ICD-10-CM | POA: Diagnosis not present

## 2014-05-16 DIAGNOSIS — I252 Old myocardial infarction: Secondary | ICD-10-CM

## 2014-05-16 DIAGNOSIS — I10 Essential (primary) hypertension: Secondary | ICD-10-CM

## 2014-05-16 DIAGNOSIS — I2583 Coronary atherosclerosis due to lipid rich plaque: Principal | ICD-10-CM

## 2014-05-16 MED ORDER — CLOPIDOGREL BISULFATE 75 MG PO TABS: 75.0000 mg | ORAL_TABLET | Freq: Every day | ORAL | Status: DC

## 2014-05-16 NOTE — Progress Notes (Signed)
[address] [place], [place]  [address] Phone: [phone] Fax:  [phone]  Date:  05/16/2014   ID:  Shannon [name] Meth, DOB 10/13/1951, MRN [id number]  PCP:  Holland CommonsKECK, VALERIE, NP   History of Present Illness: Shannon [name] [place] is a [age] y.o. female with a hx of cardiac catheterization on 03/17/10 with a drug-eluting stent to mid LAD, after a nuclear stress test showed ischemia in the distal anterolateral territory. She contines with occasional fatigue, chest pain that is intermittant and by history is atypical, non exertional and she feels may also be related to her fibromyalgia. She has had multiple types of chest pain Occasionally has felt dizzy and weak and nauseated like when potassium is low. At last check her cholesterol was excellent. She is on Crestor. She is quite anxious.     Wt Readings from Last 3 Encounters:  05/16/14 160 lb (72.576 kg)  04/13/14 157 lb (71.215 kg)  03/14/14 157 lb (71.215 kg)     Past Medical History  Diagnosis Date  . Hypertension   . Coronary artery disease   . Diabetes mellitus without complication   . Arthritis   . Fibromyalgia   . Irritable bowel   . Anxiety   . Depression   . Fibromyalgia 1995    Past Surgical History  Procedure Laterality Date  . Carotid stent    . Cholecystectomy      Current Outpatient Prescriptions  Medication Sig Dispense Refill  . aspirin EC 81 MG tablet Take 81 mg by mouth every morning.      . clopidogrel (PLAVIX) 75 MG tablet Take 1 tablet (75 mg total) by mouth daily with breakfast.  30 tablet  0  . gabapentin (NEURONTIN) 100 MG capsule Take 1 capsule (100 mg total) by mouth 2 (two) times daily.  60 capsule  1  . glimepiride (AMARYL) 4 MG tablet Take 4 mg by mouth 2 (two) times daily.       . hydrOXYzine (ATARAX/VISTARIL) 25 MG tablet Take 1 tablet (25 mg total) by mouth 3 (three) times daily as needed for itching.  30 tablet  1  . isosorbide dinitrate (ISORDIL) 30 MG tablet Take 1 tablet  (30 mg total) by mouth every morning.  30 tablet  2  . levothyroxine (LEVOTHROID) 25 MCG tablet Take 1 tablet (25 mcg total) by mouth daily before breakfast.  60 tablet  0  . metoprolol (LOPRESSOR) 50 MG tablet Take 1 tablet (50 mg total) by mouth 2 (two) times daily. For blood pressure  60 tablet  3  . nitroGLYCERIN (NITROSTAT) 0.4 MG SL tablet Place 1 tablet (0.4 mg total) under the tongue every 5 (five) minutes as needed for chest pain.  25 tablet  0  . omeprazole (PRILOSEC) 20 MG capsule Take 1 capsule (20 mg total) by mouth daily. For acid reflux  30 capsule  3  . pravastatin (PRAVACHOL) 20 MG tablet Take 1 tablet (20 mg total) by mouth daily.  30 tablet  2  . triamcinolone cream (KENALOG) 0.1 % Apply 1 application topically 2 (two) times daily. Do not apply to face  30 g  0  . vitamin E (VITAMIN E) 400 UNIT capsule Take 400 Units by mouth every morning.       No current facility-administered medications for this visit.    Allergies:    Allergies  Allergen Reactions  . Glimepiride     DIARRHEA GIVES PROBLEMS WITH IBS  .  Sulfa Antibiotics Other (See Comments)    Childhood allergy    Social History:  The patient  reports that she has never smoked. She does not have any smokeless tobacco history on file. She reports that she does not drink alcohol or use illicit drugs.   Family History  Problem Relation Age of Onset  . Cancer Mother   . Diabetes Mother   . Hypertension Mother   . Heart failure Mother   . Cancer Father   . Diabetes Father   . Hypertension Father   . Heart failure Father   . CAD Father   . CAD Mother   . CAD Brother   . CAD Sister     ROS:  Please see the history of present illness.   Crying, anxious.   All other systems reviewed and negative.   PHYSICAL EXAM: VS:  BP 150/100  Pulse 64  Ht 5\' 4"  (1.626 m)  Wt 160 lb (72.576 kg)  BMI 27.45 kg/m2 Well nourished, well developed, in no acute distress HEENT: normal, [place]/AT, EOMI Neck: no JVD, normal carotid  upstroke, no bruit Cardiac:  normal S1, S2; RRR; no murmur Lungs:  clear to auscultation bilaterally, no wheezing, rhonchi or rales Abd: soft, nontender, no hepatomegaly, no bruits Ext: no edema, 2+ distal pulses Skin: warm and dry GU: deferred Neuro: no focal abnormalities noted, AAO x 3  EKG:  None today   ASSESSMENT AND PLAN:  1. Coronary artery disease with chest pain/anxiety certainly could be provoking some of her chest discomfort. She is very anxious about having this further evaluated with stress test. I tried to explain to her that this was the best method proceed in a noninvasive manner. She states that she does have some discomfort at times which walking but most of the time it occurs when just sitting in a position for a long period of time. We will check a pharmacologic stress test. Tried to reassure her. 2. Hypertension-elevated today. Anxious. Continue with current antihypertensives. Encouraged her to continue to work with her primary provider. 3. I will followup with test, one year follow up.   Signed, Donato SchultzMark Ibrahima Holberg, MD [facility]  05/16/2014 3:51 PM

## 2014-05-16 NOTE — Patient Instructions (Signed)
The current medical regimen is effective;  continue present plan and medications.  Your physician has requested that you have a lexiscan myoview. For further information please visit [email or web address]. Please follow instruction sheet, as given.  Follow up in 1 year with Dr. Skains.  You will receive a letter in the mail 2 months before you are due.  Please call us when you receive this letter to schedule your follow up appointment.  

## 2014-05-21 ENCOUNTER — Other Ambulatory Visit: Payer: Self-pay | Admitting: Emergency Medicine

## 2014-05-21 DIAGNOSIS — E039 Hypothyroidism, unspecified: Secondary | ICD-10-CM

## 2014-05-21 MED ORDER — LEVOTHYROXINE SODIUM 25 MCG PO TABS: 25.0000 ug | ORAL_TABLET | Freq: Every day | ORAL | Status: DC

## 2014-05-28 ENCOUNTER — Telehealth: Payer: Self-pay | Admitting: Cardiology

## 2014-05-28 NOTE — Telephone Encounter (Signed)
Pt only want to talk to Dr Anne FuSkains

## 2014-05-28 NOTE — Telephone Encounter (Signed)
New message      Talk to Dr Anne FuSkains only regarding her stress test.  She did not want to talk to someone in the nuclear dept--she wanted dr Anne FuSkains only

## 2014-05-28 NOTE — Telephone Encounter (Signed)
I called and got her voice mail. She was quite anxious during office visit about nuclear stress test.  Donato SchultzSKAINS, Shannon Posner, MD

## 2014-05-29 ENCOUNTER — Encounter (HOSPITAL_COMMUNITY): Payer: Medicare Other | Attending: Cardiology | Admitting: Radiology

## 2014-05-29 VITALS — BP 202/86 | HR 60 | Ht 64.0 in | Wt 157.0 lb

## 2014-05-29 DIAGNOSIS — R079 Chest pain, unspecified: Secondary | ICD-10-CM

## 2014-05-29 DIAGNOSIS — R0789 Other chest pain: Secondary | ICD-10-CM | POA: Insufficient documentation

## 2014-05-29 DIAGNOSIS — I2583 Coronary atherosclerosis due to lipid rich plaque: Secondary | ICD-10-CM | POA: Insufficient documentation

## 2014-05-29 DIAGNOSIS — I2510 Atherosclerotic heart disease of native coronary artery without angina pectoris: Secondary | ICD-10-CM | POA: Diagnosis present

## 2014-05-29 MED ORDER — REGADENOSON 0.4 MG/5ML IV SOLN
0.4000 mg | Freq: Once | INTRAVENOUS | Status: AC
  Administered 2014-05-29: 0.4 mg via INTRAVENOUS

## 2014-05-29 MED ORDER — TECHNETIUM TC 99M SESTAMIBI GENERIC - CARDIOLITE
11.0000 | Freq: Once | INTRAVENOUS | Status: AC | PRN
  Administered 2014-05-29: 11 via INTRAVENOUS

## 2014-05-29 MED ORDER — TECHNETIUM TC 99M SESTAMIBI GENERIC - CARDIOLITE
33.0000 | Freq: Once | INTRAVENOUS | Status: AC | PRN
  Administered 2014-05-29: 33 via INTRAVENOUS

## 2014-05-29 NOTE — Progress Notes (Signed)
[facility] NUCLEAR MED [name] [place], [place] [address] [phone]    Cardiology Nuclear Med Study  Shannon [name] is a 63 y.o. [name]     MRN : [id number]     DOB: 01/05/1951  Procedure Date: 05/29/2014  Nuclear Med Background Indication for Stress Test:  Evaluation for Ischemia and Stent Patency History:  CAD, Cath, Stent (LAD), Echo 2014 EF 50-55%, MPI 2011 (ischemia) 70% Cardiac Risk Factors: Family History - CAD, Hypertension and Lipids  Symptoms:  Chest Pain, Dizziness, Fatigue and Palpitations   Nuclear Pre-Procedure Caffeine/Decaff Intake:  None> 12 hrs NPO After: 9:00pm   Lungs:  clear O2 Sat: 98% on room air. IV 0.9% NS with Angio Cath:  22g  IV Site: R Wrist x 1, tolerated well IV Started by:  Shannon HongPatsy Edwards, RN  Chest Size (in):  42 Cup Size: DD  Height: 5\' 4"  (1.626 m)  Weight:  157 lb (71.215 kg)  BMI:  Body mass index is 26.94 kg/(m^2). Tech Comments:  Patient held Amaryl and Lopressor this am. Shannon HongPatsy Edwards, RN.    Nuclear Med Study 1 or 2 day study: 1 day  Stress Test Type:  Shannon BirksLexiscan  Reading MD: N/A  Order Authorizing Provider:  Donato SchultzMark Skains, MD  Resting Radionuclide: Technetium 5321m Sestamibi  Resting Radionuclide Dose: 11.0 mCi   Stress Radionuclide:  Technetium 2621m Sestamibi  Stress Radionuclide Dose: 33.0 mCi           Stress Protocol Rest HR: 60 Stress HR: 91  Rest BP: 202/86 Stress BP: 172/88  Exercise Time (min): n/a METS: n/a           Dose of Adenosine (mg):  n/a Dose of Shannon [name]: 0.4 mg  Dose of Atropine (mg): n/a Dose of Dobutamine: n/a mcg/kg/min (at max HR)  Stress Test Technologist: Shannon [name], BS-ES  Nuclear Technologist:  Shannon [name], CNMT     Rest Procedure:  Myocardial perfusion imaging was performed at rest 45 minutes following the intravenous administration of Technetium 8021m Sestamibi. Rest ECG: NSR with non-specific ST-T wave changes  Stress Procedure:  The patient received IV Shannon [name]  0.4 mg over 15-seconds.  Technetium 821m Sestamibi injected at 30-seconds.  Quantitative spect images were obtained after a 45 minute delay.  During the infusion of Shannon [name] the patient complained of feeling anxious, heart racing, chest tightness and SOB.  These symptoms began to resolve in recovery.  Stress ECG: No significant change from baseline ECG  QPS Raw Data Images:  Normal; no motion artifact; normal heart/lung ratio. Stress Images:  Normal homogeneous uptake in all areas of the myocardium. Rest Images:  Normal homogeneous uptake in all areas of the myocardium. Subtraction (SDS):  No evidence of ischemia. Transient Ischemic Dilatation (Normal <1.22):  1.12 Lung/Heart Ratio (Normal <0.45):  0.30  Quantitative Gated Spect Images QGS EDV:  68 ml QGS ESV:  28 ml  Impression Exercise Capacity:  Shannon [name] with no exercise. BP Response:  Normal blood pressure response. Clinical Symptoms:  Mild chest pain/dyspnea. ECG Impression:  No significant ST segment change suggestive of ischemia. Comparison with Prior Nuclear Study: Since prior study of 2011, periapical ischemia is no longer seen.  Overall Impression:  Normal stress nuclear study.  LV Ejection Fraction: 59%.  LV Wall Motion:  NL LV Function; NL Wall Motion  Shannon Clementhomas Caretha Rumbaugh MD

## 2014-05-29 NOTE — Telephone Encounter (Signed)
Left message for pt that Dr Anne FuSkains attempted to contact her yesterday but was unable to reach her.  Requested she call back with her concerns.

## 2014-05-31 NOTE — Telephone Encounter (Signed)
Pt had myoview 7/21.

## 2014-06-13 ENCOUNTER — Other Ambulatory Visit: Payer: Medicare Other

## 2014-06-13 ENCOUNTER — Ambulatory Visit: Payer: Medicaid Other | Admitting: Internal Medicine

## 2014-07-13 ENCOUNTER — Other Ambulatory Visit: Payer: Self-pay | Admitting: Internal Medicine

## 2014-07-17 ENCOUNTER — Ambulatory Visit: Payer: Medicare Other | Attending: Internal Medicine | Admitting: Internal Medicine

## 2014-07-17 ENCOUNTER — Encounter: Payer: Self-pay | Admitting: Internal Medicine

## 2014-07-17 VITALS — BP 204/116 | HR 75 | Temp 98.3°F | Resp 16 | Ht 63.0 in | Wt 158.0 lb

## 2014-07-17 DIAGNOSIS — Z2821 Immunization not carried out because of patient refusal: Secondary | ICD-10-CM

## 2014-07-17 DIAGNOSIS — G589 Mononeuropathy, unspecified: Secondary | ICD-10-CM | POA: Insufficient documentation

## 2014-07-17 DIAGNOSIS — I2583 Coronary atherosclerosis due to lipid rich plaque: Secondary | ICD-10-CM

## 2014-07-17 DIAGNOSIS — I10 Essential (primary) hypertension: Secondary | ICD-10-CM | POA: Insufficient documentation

## 2014-07-17 DIAGNOSIS — I2510 Atherosclerotic heart disease of native coronary artery without angina pectoris: Secondary | ICD-10-CM

## 2014-07-17 DIAGNOSIS — IMO0001 Reserved for inherently not codable concepts without codable children: Secondary | ICD-10-CM | POA: Insufficient documentation

## 2014-07-17 DIAGNOSIS — F411 Generalized anxiety disorder: Secondary | ICD-10-CM | POA: Diagnosis not present

## 2014-07-17 DIAGNOSIS — G629 Polyneuropathy, unspecified: Secondary | ICD-10-CM

## 2014-07-17 DIAGNOSIS — E039 Hypothyroidism, unspecified: Secondary | ICD-10-CM | POA: Diagnosis not present

## 2014-07-17 DIAGNOSIS — E119 Type 2 diabetes mellitus without complications: Secondary | ICD-10-CM | POA: Diagnosis not present

## 2014-07-17 DIAGNOSIS — I252 Old myocardial infarction: Secondary | ICD-10-CM

## 2014-07-17 LAB — TSH: TSH: 4.114 u[IU]/mL (ref 0.350–4.500)

## 2014-07-17 LAB — GLUCOSE, POCT (MANUAL RESULT ENTRY): POC GLUCOSE: 165 mg/dL — AB (ref 70–99)

## 2014-07-17 LAB — POCT GLYCOSYLATED HEMOGLOBIN (HGB A1C): Hemoglobin A1C: 7.8

## 2014-07-17 MED ORDER — GLUCOSE BLOOD VI STRP: ORAL_STRIP | Status: DC

## 2014-07-17 MED ORDER — HYDROXYZINE HCL 10 MG PO TABS: 10.0000 mg | ORAL_TABLET | Freq: Three times a day (TID) | ORAL | Status: DC | PRN

## 2014-07-17 MED ORDER — GABAPENTIN 300 MG PO CAPS: 300.0000 mg | ORAL_CAPSULE | Freq: Three times a day (TID) | ORAL | Status: DC

## 2014-07-17 MED ORDER — FREESTYLE LANCETS MISC: Status: DC

## 2014-07-17 MED ORDER — FREESTYLE SYSTEM KIT: 1.0000 | PACK | Status: DC | PRN

## 2014-07-17 MED ORDER — AMLODIPINE BESYLATE 5 MG PO TABS
5.0000 mg | ORAL_TABLET | Freq: Every day | ORAL | Status: DC
Start: 2014-07-17 — End: 2015-03-15

## 2014-07-17 MED ORDER — PRAVASTATIN SODIUM 20 MG PO TABS: 20.0000 mg | ORAL_TABLET | Freq: Every day | ORAL | Status: DC

## 2014-07-17 MED ORDER — GLIMEPIRIDE 2 MG PO TABS: 2.0000 mg | ORAL_TABLET | Freq: Two times a day (BID) | ORAL | Status: DC

## 2014-07-17 NOTE — Progress Notes (Signed)
Patient ID: Shannon [name], female   DOB: October 21, 1951, 63 y.o.   MRN: [id number]  CC: HTN, DM  HPI: She reports that when she checks her CBG it ranges between 165-250. She has not checked sugar in 3 weeks due to broken meter. Has only been taking lopressor once per day because she was unsure if it was once or twice daily.  Last dose of Lopressor was this AM. She is unsure if she takes her isosorbide daily.  Has history of anxiety and depression and feels like her mind is racing all night, effecting her sleep pattern.  She has been very stressed due to her granddaughter being diagnosed with cancer. Has not been taking Metformin due to IBS complications, only took for two weeks.  She reports that she had a refill of her amaryl and began taking that until she ran out.    Allergies  Allergen Reactions  . Glimepiride     DIARRHEA GIVES PROBLEMS WITH IBS  . Sulfa Antibiotics Other (See Comments)    Childhood allergy   Past Medical History  Diagnosis Date  . Hypertension   . Coronary artery disease   . Diabetes mellitus without complication   . Arthritis   . Fibromyalgia   . Irritable bowel   . Anxiety   . Depression   . Fibromyalgia 1995   Current Outpatient Prescriptions on File Prior to Visit  Medication Sig Dispense Refill  . aspirin EC 81 MG tablet Take 81 mg by mouth every morning.      . clopidogrel (PLAVIX) 75 MG tablet Take 1 tablet (75 mg total) by mouth daily with breakfast.  30 tablet  6  . gabapentin (NEURONTIN) 100 MG capsule Take 1 capsule (100 mg total) by mouth 2 (two) times daily.  60 capsule  1  . glimepiride (AMARYL) 4 MG tablet Take 4 mg by mouth 2 (two) times daily.       . hydrOXYzine (ATARAX/VISTARIL) 25 MG tablet TAKE 1 TABLET (25 MG TOTAL) BY MOUTH 3 (THREE) TIMES DAILY AS NEEDED FOR ITCHING.  30 tablet  0  . isosorbide dinitrate (ISORDIL) 30 MG tablet Take 1 tablet (30 mg total) by mouth every morning.  30 tablet  2  . levothyroxine (LEVOTHROID) 25 MCG tablet Take  1 tablet (25 mcg total) by mouth daily before breakfast.  60 tablet  0  . metoprolol (LOPRESSOR) 50 MG tablet Take 1 tablet (50 mg total) by mouth 2 (two) times daily. For blood pressure  60 tablet  3  . nitroGLYCERIN (NITROSTAT) 0.4 MG SL tablet Place 1 tablet (0.4 mg total) under the tongue every 5 (five) minutes as needed for chest pain.  25 tablet  0  . omeprazole (PRILOSEC) 20 MG capsule Take 1 capsule (20 mg total) by mouth daily. For acid reflux  30 capsule  3  . pravastatin (PRAVACHOL) 20 MG tablet Take 1 tablet (20 mg total) by mouth daily.  30 tablet  2  . triamcinolone cream (KENALOG) 0.1 % Apply 1 application topically 2 (two) times daily. Do not apply to face  30 g  0  . vitamin E (VITAMIN E) 400 UNIT capsule Take 400 Units by mouth every morning.       No current facility-administered medications on file prior to visit.   Family History  Problem Relation Age of Onset  . Cancer Mother   . Diabetes Mother   . Hypertension Mother   . Heart failure Mother   . Cancer Father   .  Diabetes Father   . Hypertension Father   . Heart failure Father   . CAD Father   . CAD Mother   . CAD Brother   . CAD Sister    History   Social History  . Marital Status: Married    Spouse Name: N/A    Number of Children: N/A  . Years of Education: N/A   Occupational History  . Not on file.   Social History Main Topics  . Smoking status: Never Smoker   . Smokeless tobacco: Not on file  . Alcohol Use: No  . Drug Use: No  . Sexual Activity: Not on file   Other Topics Concern  . Not on file   Social History Narrative  . No narrative on file   Review of Systems  Constitutional: Negative for fever and chills.  Eyes: Negative.   Respiratory: Negative.   Cardiovascular: Negative.   Neurological: Negative for dizziness, tingling and headaches.       Objective:   Filed Vitals:   07/17/14 1555  BP: 204/116  Pulse: 75  Temp: 98.3 F (36.8 C)  Resp: 16   Physical Exam   Cardiovascular: Normal rate, regular rhythm and normal heart sounds.   Pulmonary/Chest: Effort normal and breath sounds normal.  Abdominal: Soft. Bowel sounds are normal.  Musculoskeletal: Normal range of motion. She exhibits no edema and no tenderness.  Neurological: She is alert.     Lab Results  Component Value Date   WBC 8.0 03/14/2014   HGB 13.9 03/14/2014   HCT 40.3 03/14/2014   MCV 82.1 03/14/2014   PLT 181 03/14/2014   Lab Results  Component Value Date   CREATININE 0.74 03/14/2014   BUN 11 03/14/2014   NA 140 03/14/2014   K 4.2 03/14/2014   CL 101 03/14/2014   CO2 27 03/14/2014    Lab Results  Component Value Date   HGBA1C 6.9 03/14/2014   Lipid Panel     Component Value Date/Time   CHOL 234* 03/14/2014 1554   TRIG 140 03/14/2014 1554   HDL 52 03/14/2014 1554   CHOLHDL 4.5 03/14/2014 1554   VLDL 28 03/14/2014 1554   LDLCALC 154* 03/14/2014 1554       Assessment and plan:   Shannon [name] was seen today for follow-up.  Diagnoses and associated orders for this visit:  Type 2 diabetes mellitus without complication - Glucose (CBG) - HgB A1c - glucose monitoring kit (FREESTYLE) monitoring kit; 1 each by Does not apply route as needed. - glucose blood test strip; Use as instructed - Lancets (FREESTYLE) lancets; Use as instructed - glimepiride (AMARYL) 2 MG tablet; Take 1 tablet (2 mg total) by mouth 2 (two) times daily.  Essential hypertension - amLODipine (NORVASC) 5 MG tablet; Take 1 tablet (5 mg total) by mouth daily. Patient refused several anti-hypertensive.  History of MI (myocardial infarction) - pravastatin (PRAVACHOL) 20 MG tablet; Take 1 tablet (20 mg total) by mouth daily.  Unspecified hypothyroidism - TSH. Will call if any changes in regimen are needed  Neuropathy - Increased gabapentin (NEURONTIN) 300 MG capsule; Take 1 capsule (300 mg total) by mouth 3 (three) times daily.  Anxiety state, unspecified - hydrOXYzine (ATARAX/VISTARIL) 10 MG tablet; Take 1 tablet (10 mg total)  by mouth 3 (three) times daily as needed. Patient reports that she needed something for sleep and depression. Offered to d/c vistaril and add trazodone, explained the dual effects.  Patient became very argumentative and declined.   Gave information to  [facility]   Immunization refused Strongly suggested patient to receive vaccine     Have suspusion of noncompliance with HTN meds    Chari Manning, NP-C [facility] [phone] 07/17/2014, 4:24 PM

## 2014-07-17 NOTE — Patient Instructions (Signed)
DASH Eating Plan °DASH stands for "Dietary Approaches to Stop Hypertension." The DASH eating plan is a healthy eating plan that has been shown to reduce high blood pressure (hypertension). Additional health benefits may include reducing the risk of type 2 diabetes mellitus, heart disease, and stroke. The DASH eating plan may also help with weight loss. °WHAT DO I NEED TO KNOW ABOUT THE DASH EATING PLAN? °For the DASH eating plan, you will follow these general guidelines: °· Choose foods with a percent daily value for sodium of less than 5% (as listed on the food label). °· Use salt-free seasonings or herbs instead of table salt or sea salt. °· Check with your health care provider or pharmacist before using salt substitutes. °· Eat lower-sodium products, often labeled as "lower sodium" or "no salt added." °· Eat fresh foods. °· Eat more vegetables, fruits, and low-fat dairy products. °· Choose whole grains. Look for the word "whole" as the first word in the ingredient list. °· Choose fish and skinless chicken or turkey more often than red meat. Limit fish, poultry, and meat to 6 oz (170 g) each day. °· Limit sweets, desserts, sugars, and sugary drinks. °· Choose heart-healthy fats. °· Limit cheese to 1 oz (28 g) per day. °· Eat more home-cooked food and less restaurant, buffet, and fast food. °· Limit fried foods. °· Cook foods using methods other than frying. °· Limit canned vegetables. If you do use them, rinse them well to decrease the sodium. °· When eating at a restaurant, ask that your food be prepared with less salt, or no salt if possible. °WHAT FOODS CAN I EAT? °Seek help from a dietitian for individual calorie needs. °Grains °Whole grain or whole wheat bread. Brown rice. Whole grain or whole wheat pasta. Quinoa, bulgur, and whole grain cereals. Low-sodium cereals. Corn or whole wheat flour tortillas. Whole grain cornbread. Whole grain crackers. Low-sodium crackers. °Vegetables °Fresh or frozen vegetables  (raw, steamed, roasted, or grilled). Low-sodium or reduced-sodium tomato and vegetable juices. Low-sodium or reduced-sodium tomato sauce and paste. Low-sodium or reduced-sodium canned vegetables.  °Fruits °All fresh, canned (in natural juice), or frozen fruits. °Meat and Other Protein Products °Ground beef (85% or leaner), grass-fed beef, or beef trimmed of fat. Skinless chicken or turkey. Ground chicken or turkey. Pork trimmed of fat. All fish and seafood. Eggs. Dried beans, peas, or lentils. Unsalted nuts and seeds. Unsalted canned beans. °Dairy °Low-fat dairy products, such as skim or 1% milk, 2% or reduced-fat cheeses, low-fat ricotta or cottage cheese, or plain low-fat yogurt. Low-sodium or reduced-sodium cheeses. °Fats and Oils °Tub margarines without trans fats. Light or reduced-fat mayonnaise and salad dressings (reduced sodium). Avocado. Safflower, olive, or canola oils. Natural peanut or almond butter. °Other °Unsalted popcorn and pretzels. °The items listed above may not be a complete list of recommended foods or beverages. Contact your dietitian for more options. °WHAT FOODS ARE NOT RECOMMENDED? °Grains °White bread. White pasta. White rice. Refined cornbread. Bagels and croissants. Crackers that contain trans fat. °Vegetables °Creamed or fried vegetables. Vegetables in a cheese sauce. Regular canned vegetables. Regular canned tomato sauce and paste. Regular tomato and vegetable juices. °Fruits °Dried fruits. Canned fruit in light or heavy syrup. Fruit juice. °Meat and Other Protein Products °Fatty cuts of meat. Ribs, chicken wings, bacon, sausage, bologna, salami, chitterlings, fatback, hot dogs, bratwurst, and packaged luncheon meats. Salted nuts and seeds. Canned beans with salt. °Dairy °Whole or 2% milk, cream, half-and-half, and cream cheese. Whole-fat or sweetened yogurt. Full-fat   cheeses or blue cheese. Nondairy creamers and whipped toppings. Processed cheese, cheese spreads, or cheese  curds. °Condiments °Onion and garlic salt, seasoned salt, table salt, and sea salt. Canned and packaged gravies. Worcestershire sauce. Tartar sauce. Barbecue sauce. Teriyaki sauce. Soy sauce, including reduced sodium. Steak sauce. Fish sauce. Oyster sauce. Cocktail sauce. Horseradish. Ketchup and mustard. Meat flavorings and tenderizers. Bouillon cubes. Hot sauce. Tabasco sauce. Marinades. Taco seasonings. Relishes. °Fats and Oils °Butter, stick margarine, lard, shortening, ghee, and bacon fat. Coconut, palm kernel, or palm oils. Regular salad dressings. °Other °Pickles and olives. Salted popcorn and pretzels. °The items listed above may not be a complete list of foods and beverages to avoid. Contact your dietitian for more information. °WHERE CAN I FIND MORE INFORMATION? °National Heart, Lung, and [employer] Institute: www.nhlbi.nih.gov/health/health-topics/topics/dash/ °Document Released: 10/15/2011 Document Revised: 03/12/2014 Document Reviewed: 08/30/2013 °Exit[employer]® Patient Information ©2015 [employer]. This information is not intended to replace advice given to you by your health care provider. Make sure you discuss any questions you have with your health care provider. ° °

## 2014-07-17 NOTE — Progress Notes (Signed)
Pt is here following up on her HTN and diabetes. Pt is still having pain from her fibromyalgia, arthritis and her diabetic neuropathy.

## 2014-07-19 ENCOUNTER — Other Ambulatory Visit: Payer: Self-pay | Admitting: Internal Medicine

## 2014-07-19 DIAGNOSIS — G629 Polyneuropathy, unspecified: Secondary | ICD-10-CM

## 2014-07-19 DIAGNOSIS — E119 Type 2 diabetes mellitus without complications: Secondary | ICD-10-CM

## 2014-07-19 MED ORDER — GLUCOSE BLOOD VI STRP: ORAL_STRIP | Status: DC

## 2014-07-19 MED ORDER — GABAPENTIN 300 MG PO CAPS: 300.0000 mg | ORAL_CAPSULE | Freq: Three times a day (TID) | ORAL | Status: DC

## 2014-07-19 MED ORDER — FREESTYLE LANCETS MISC: Status: DC

## 2014-07-31 ENCOUNTER — Ambulatory Visit: Payer: Medicare Other | Attending: Internal Medicine

## 2014-07-31 ENCOUNTER — Telehealth: Payer: Self-pay | Admitting: Internal Medicine

## 2014-07-31 VITALS — BP 181/89 | HR 70 | Resp 16

## 2014-07-31 DIAGNOSIS — E119 Type 2 diabetes mellitus without complications: Secondary | ICD-10-CM

## 2014-07-31 MED ORDER — FREESTYLE SYSTEM KIT: PACK | Status: DC

## 2014-07-31 NOTE — Progress Notes (Unsigned)
Patient ID: Shannon [name], female   DOB: 02/12/1951, 63 y.o.   MRN: [id number]  Pt states she is compliant with taking medications daily Denies headache,n,v or dizziness BP- 175/89 71Pt comes in for blood pressure recheck s/p HTN crisis last visit 07/17/14 Amlodipine 5 mg tab added to regimen Amlodipine increased to 10 mg tab with return nurse visit in 2 weeks

## 2014-07-31 NOTE — Telephone Encounter (Signed)
Pt has been unable to obtain medications prescribed on 07/17/14 because pharmacy needs to know frequency of pt taking medication in order to bill pt's insurance.  Pt says she takes medications up to 4 times a day but pharmacy needs either verbal confirmation, faxed confirmation or new e-script (including diagnosis code). Please f/u [facility].

## 2014-07-31 NOTE — Patient Instructions (Signed)
Take 2 tablets of Amlodipine daily and return in 2 weeks for repeat

## 2014-09-13 ENCOUNTER — Other Ambulatory Visit: Payer: Self-pay | Admitting: Internal Medicine

## 2014-09-14 ENCOUNTER — Other Ambulatory Visit: Payer: Self-pay | Admitting: Internal Medicine

## 2014-09-16 ENCOUNTER — Other Ambulatory Visit: Payer: Self-pay | Admitting: Internal Medicine

## 2014-09-20 ENCOUNTER — Other Ambulatory Visit: Payer: Self-pay | Admitting: Internal Medicine

## 2014-09-24 ENCOUNTER — Other Ambulatory Visit: Payer: Self-pay | Admitting: Internal Medicine

## 2014-10-09 DIAGNOSIS — Z8673 Personal history of transient ischemic attack (TIA), and cerebral infarction without residual deficits: Secondary | ICD-10-CM

## 2014-10-09 HISTORY — DX: Personal history of transient ischemic attack (TIA), and cerebral infarction without residual deficits: Z86.73

## 2014-10-13 ENCOUNTER — Other Ambulatory Visit: Payer: Self-pay | Admitting: Internal Medicine

## 2014-10-23 ENCOUNTER — Emergency Department (HOSPITAL_COMMUNITY): Payer: Medicare Other

## 2014-10-23 ENCOUNTER — Inpatient Hospital Stay (HOSPITAL_COMMUNITY)
Admission: EM | Admit: 2014-10-23 | Discharge: 2014-10-26 | DRG: 066 | Disposition: A | Discharge status: Home Health | Payer: Medicare Other | Attending: Internal Medicine | Admitting: Internal Medicine

## 2014-10-23 ENCOUNTER — Encounter (HOSPITAL_COMMUNITY): Payer: Self-pay | Admitting: Physical Medicine and Rehabilitation

## 2014-10-23 DIAGNOSIS — R200 Anesthesia of skin: Secondary | ICD-10-CM

## 2014-10-23 DIAGNOSIS — I639 Cerebral infarction, unspecified: Secondary | ICD-10-CM | POA: Diagnosis present

## 2014-10-23 DIAGNOSIS — Z882 Allergy status to sulfonamides status: Secondary | ICD-10-CM

## 2014-10-23 DIAGNOSIS — I2510 Atherosclerotic heart disease of native coronary artery without angina pectoris: Secondary | ICD-10-CM | POA: Diagnosis present

## 2014-10-23 DIAGNOSIS — I63531 Cerebral infarction due to unspecified occlusion or stenosis of right posterior cerebral artery: Secondary | ICD-10-CM | POA: Diagnosis not present

## 2014-10-23 DIAGNOSIS — E785 Hyperlipidemia, unspecified: Secondary | ICD-10-CM | POA: Diagnosis present

## 2014-10-23 DIAGNOSIS — R0602 Shortness of breath: Secondary | ICD-10-CM

## 2014-10-23 DIAGNOSIS — G4733 Obstructive sleep apnea (adult) (pediatric): Secondary | ICD-10-CM | POA: Diagnosis present

## 2014-10-23 DIAGNOSIS — I252 Old myocardial infarction: Secondary | ICD-10-CM

## 2014-10-23 DIAGNOSIS — M797 Fibromyalgia: Secondary | ICD-10-CM | POA: Diagnosis present

## 2014-10-23 DIAGNOSIS — Z833 Family history of diabetes mellitus: Secondary | ICD-10-CM

## 2014-10-23 DIAGNOSIS — H5340 Unspecified visual field defects: Secondary | ICD-10-CM | POA: Diagnosis present

## 2014-10-23 DIAGNOSIS — Z79899 Other long term (current) drug therapy: Secondary | ICD-10-CM

## 2014-10-23 DIAGNOSIS — K219 Gastro-esophageal reflux disease without esophagitis: Secondary | ICD-10-CM | POA: Diagnosis present

## 2014-10-23 DIAGNOSIS — L259 Unspecified contact dermatitis, unspecified cause: Secondary | ICD-10-CM | POA: Diagnosis present

## 2014-10-23 DIAGNOSIS — I672 Cerebral atherosclerosis: Secondary | ICD-10-CM | POA: Diagnosis present

## 2014-10-23 DIAGNOSIS — I10 Essential (primary) hypertension: Secondary | ICD-10-CM | POA: Diagnosis present

## 2014-10-23 DIAGNOSIS — R202 Paresthesia of skin: Secondary | ICD-10-CM

## 2014-10-23 DIAGNOSIS — K589 Irritable bowel syndrome without diarrhea: Secondary | ICD-10-CM | POA: Diagnosis present

## 2014-10-23 DIAGNOSIS — Z888 Allergy status to other drugs, medicaments and biological substances status: Secondary | ICD-10-CM

## 2014-10-23 DIAGNOSIS — F419 Anxiety disorder, unspecified: Secondary | ICD-10-CM | POA: Diagnosis present

## 2014-10-23 DIAGNOSIS — Z8249 Family history of ischemic heart disease and other diseases of the circulatory system: Secondary | ICD-10-CM

## 2014-10-23 DIAGNOSIS — E039 Hypothyroidism, unspecified: Secondary | ICD-10-CM | POA: Diagnosis present

## 2014-10-23 DIAGNOSIS — Z7982 Long term (current) use of aspirin: Secondary | ICD-10-CM

## 2014-10-23 DIAGNOSIS — E119 Type 2 diabetes mellitus without complications: Secondary | ICD-10-CM | POA: Diagnosis present

## 2014-10-23 DIAGNOSIS — G459 Transient cerebral ischemic attack, unspecified: Secondary | ICD-10-CM

## 2014-10-23 DIAGNOSIS — Z7902 Long term (current) use of antithrombotics/antiplatelets: Secondary | ICD-10-CM

## 2014-10-23 LAB — BASIC METABOLIC PANEL
Anion gap: 14 (ref 5–15)
BUN: 14 mg/dL (ref 6–23)
CO2: 26 mEq/L (ref 19–32)
CREATININE: 0.79 mg/dL (ref 0.50–1.10)
Calcium: 10.3 mg/dL (ref 8.4–10.5)
Chloride: 100 mEq/L (ref 96–112)
GFR calc non Af Amer: 86 mL/min — ABNORMAL LOW (ref >90)
Glucose, Bld: 138 mg/dL — ABNORMAL HIGH (ref 70–99)
POTASSIUM: 4.3 meq/L (ref 3.7–5.3)
Sodium: 140 mEq/L (ref 137–147)

## 2014-10-23 LAB — CBC
HCT: 42.4 % (ref 36.0–46.0)
Hemoglobin: 14.2 g/dL (ref 12.0–15.0)
MCH: 28.2 pg (ref 26.0–34.0)
MCHC: 33.5 g/dL (ref 30.0–36.0)
MCV: 84.3 fL (ref 78.0–100.0)
PLATELETS: 200 10*3/uL (ref 150–400)
RBC: 5.03 MIL/uL (ref 3.87–5.11)
RDW: 13.4 % (ref 11.5–15.5)
WBC: 9.6 10*3/uL (ref 4.0–10.5)

## 2014-10-23 LAB — I-STAT TROPONIN, ED: Troponin i, poc: 0 ng/mL (ref 0.00–0.08)

## 2014-10-23 LAB — APTT: aPTT: 29 seconds (ref 24–37)

## 2014-10-23 LAB — PROTIME-INR
INR: 1.1 (ref 0.00–1.49)
Prothrombin Time: 14.4 seconds (ref 11.6–15.2)

## 2014-10-23 NOTE — ED Provider Notes (Signed)
9:00 PM  Radiology called to give results on pt that I have not seen.  They state patient may have possible subacute ischemia seen on head CT versus skull artifact. Per triage note patient has left-sided numbness and tingling as well as vision changes that started over 24 hours ago. Given the high acuity and volume of patients in the ED with her multiple patients in line for MRI and that MRI would likely not change her disposition as her symptoms are concerning for possible CVA outside of TPA window she will likely need admission and feel this MRI can be ordered as an inpatient.  Pt in [facility] awaiting bed to see provider.  Shannon MawKristen N Mildred Bollard, DO 10/23/14 2253

## 2014-10-23 NOTE — ED Notes (Signed)
Pt reports it hurts to breathe and take deep breathes in her "top" of her chest. Pt with history of episodes of shortness of breathe where her arm and chest hurts, had a stress test done because of this and passed. Pt has had all of these symptoms before. New symptom of tingling to her left arm.

## 2014-10-23 NOTE — ED Notes (Signed)
Pt presents to department for evaluation of dizziness, SOB, numbness/tingling to face and entire L side of body. Also states she was seeing "halos" in her vision. Onset of symptoms began on Monday afternoon. Pt is alert and oriented x4.

## 2014-10-23 NOTE — H&P (Signed)
[facility] History and Physical  Shannon [name] HRC:[id number] DOB: Apr 05, 1951 DOA: 10/23/2014  Referring physician: Lajean Saver, MD PCP: Chari Manning, NP   Chief Complaint: Left Side Weakness  HPI: Shannon [name] is a 63 y.o. female presents with weakness tingling numbness on the left side. She states this started yesterday and has gotten worse. She has no headaches. She states that she did have some visual disturbance noted. She states no chest pain noted. She has a history of HTN and is taking her medications. She states that she does not smoke. She states that in addition she was feeling light headed and dizzy. She states no belly pain noted. She has had a mini stoke in the past. In the ED she was noted to have an elevated blood pressure. The CT scan shows a possible subacute ischemia.   Review of Systems:  Constitutional:  No weight loss, night sweats, Fevers, chills, fatigue.  HEENT:  No headaches, Difficulty swallowing,Tooth/dental problems,Sore throat Cardio-vascular:  No chest pain, Orthopnea, PND, swelling in lower extremities, anasarca, ++dizziness, No palpitations  GI:  No heartburn, indigestion, abdominal pain, nausea, vomiting, diarrhea, ++change in bowel habits  Resp:  No shortness of breath with exertion or at rest. No excess mucus, no productive cough, No non-productive cough, No coughing up of blood Skin:  no rash or lesions.  GU:  no dysuria, change in color of urine, no urgency or frequency Musculoskeletal:  No joint pain or swelling. No decreased range of motion. ++back pain.  Psych:  No change in mood or affect. No depression or anxiety. No memory loss.   Past Medical History  Diagnosis Date  . Hypertension   . Coronary artery disease   . Diabetes mellitus without complication   . Arthritis   . Fibromyalgia   . Irritable bowel   . Anxiety   . Depression   . Fibromyalgia 1995   Past Surgical History  Procedure Laterality Date  .  Carotid stent    . Cholecystectomy     Social History:  reports that she has never smoked. She does not have any smokeless tobacco history on file. She reports that she does not drink alcohol or use illicit drugs.  Allergies  Allergen Reactions  . Glimepiride     DIARRHEA GIVES PROBLEMS WITH IBS  . Sulfa Antibiotics Other (See Comments)    Childhood allergy    Family History  Problem Relation Age of Onset  . Cancer Mother   . Diabetes Mother   . Hypertension Mother   . Heart failure Mother   . Cancer Father   . Diabetes Father   . Hypertension Father   . Heart failure Father   . CAD Father   . CAD Mother   . CAD Brother   . CAD Sister      Prior to Admission medications   Medication Sig Start Date End Date Taking? Authorizing Provider  amLODipine (NORVASC) 5 MG tablet Take 1 tablet (5 mg total) by mouth daily. 07/17/14  Yes Lance Bosch, NP  aspirin EC 81 MG tablet Take 81 mg by mouth every morning.   Yes Historical Provider, MD  clopidogrel (PLAVIX) 75 MG tablet Take 1 tablet (75 mg total) by mouth daily with breakfast. 05/16/14  Yes Candee Furbish, MD  gabapentin (NEURONTIN) 100 MG capsule Take 100 mg by mouth daily as needed (pain).   Yes Historical Provider, MD  glimepiride (AMARYL) 2 MG tablet Take 1 tablet (2 mg total) by mouth 2 (  two) times daily. 07/17/14  Yes Lance Bosch, NP  glucose blood test strip Check blood sugar before each meal and at bedtime total 4 times per day 07/19/14  Yes Lance Bosch, NP  glucose monitoring kit (FREESTYLE) monitoring kit Test blood sugars 3 times per day as prescribed 07/31/14  Yes Lance Bosch, NP  hydrOXYzine (ATARAX/VISTARIL) 10 MG tablet Take 1 tablet (10 mg total) by mouth 3 (three) times daily as needed. Patient taking differently: Take 10 mg by mouth 3 (three) times daily as needed for itching or anxiety.  07/17/14  Yes Lance Bosch, NP  isosorbide mononitrate (IMDUR) 30 MG 24 hr tablet TAKE 1 TABLET (30 MG TOTAL) BY MOUTH EVERY  MORNING. 07/26/14  Yes Lance Bosch, NP  Lancets (FREESTYLE) lancets Check blood sugar four times per day. Before each meal and at bedtime 07/19/14  Yes Lance Bosch, NP  levothyroxine (SYNTHROID, LEVOTHROID) 25 MCG tablet TAKE 1 TABLET (25 MCG TOTAL) BY MOUTH DAILY BEFORE BREAKFAST. 07/26/14  Yes Lance Bosch, NP  metoprolol (LOPRESSOR) 50 MG tablet Take 1 tablet (50 mg total) by mouth 2 (two) times daily. For blood pressure 05/09/14  Yes Candee Furbish, MD  nitroGLYCERIN (NITROSTAT) 0.4 MG SL tablet Place 1 tablet (0.4 mg total) under the tongue every 5 (five) minutes as needed for chest pain. 03/14/14  Yes Lance Bosch, NP  omeprazole (PRILOSEC) 20 MG capsule Take 1 capsule (20 mg total) by mouth daily. For acid reflux Patient taking differently: Take 20 mg by mouth daily as needed (acid reflux). For acid reflux 04/13/14  Yes Lance Bosch, NP  pravastatin (PRAVACHOL) 20 MG tablet Take 1 tablet (20 mg total) by mouth daily. 07/17/14  Yes Lance Bosch, NP  triamcinolone cream (KENALOG) 0.1 % Apply 1 application topically 2 (two) times daily. Do not apply to face Patient taking differently: Apply 1 application topically 2 (two) times daily as needed (rash/itching). Do not apply to face 04/13/14  Yes Lance Bosch, NP  gabapentin (NEURONTIN) 100 MG capsule TAKE 1 CAPSULE (100 MG TOTAL) BY MOUTH 2 (TWO) TIMES DAILY. Patient not taking: Reported on 10/23/2014 07/26/14   Lance Bosch, NP  gabapentin (NEURONTIN) 300 MG capsule Take 1 capsule (300 mg total) by mouth 3 (three) times daily. Patient not taking: Reported on 10/23/2014 07/19/14   Lance Bosch, NP  hydrOXYzine (ATARAX/VISTARIL) 25 MG tablet TAKE 1 TABLET (25 MG TOTAL) BY MOUTH 3 (THREE) TIMES DAILY AS NEEDED FOR ITCHING. Patient not taking: Reported on 10/23/2014 07/26/14   Lance Bosch, NP  vitamin E (VITAMIN E) 400 UNIT capsule Take 400 Units by mouth every morning.    Historical Provider, MD   Physical Exam: Filed Vitals:   10/23/14 1859    BP: 170/84  Pulse: 74  Temp: 98.3 F (36.8 C)  TempSrc: Oral  Resp: 22  SpO2: 99%    Wt Readings from Last 3 Encounters:  07/17/14 71.668 kg (158 lb)  05/29/14 71.215 kg (157 lb)  05/16/14 72.576 kg (160 lb)    General:  Appears calm and comfortable Eyes: PERRL, normal lids, irises & conjunctiva ENT: grossly normal hearing, lips & tongue Neck: no LAD, masses or thyromegaly Cardiovascular: RRR, no m/r/g. No LE edema. Respiratory: CTA bilaterally, no w/r/r. Normal respiratory effort. Abdomen: soft, ntnd Skin: no rash or induration seen on limited exam Musculoskeletal: grossly normal tone BUE/BLE Psychiatric: grossly normal mood and affect, speech fluent and appropriate Neurologic: grossly non-focal.  Equal strength          Labs on Admission:  Basic Metabolic Panel:  Recent Labs Lab 10/23/14 1908  NA 140  K 4.3  CL 100  CO2 26  GLUCOSE 138*  BUN 14  CREATININE 0.79  CALCIUM 10.3   Liver Function Tests: No results for input(s): AST, ALT, ALKPHOS, BILITOT, PROT, ALBUMIN in the last 168 hours. No results for input(s): LIPASE, AMYLASE in the last 168 hours. No results for input(s): AMMONIA in the last 168 hours. CBC:  Recent Labs Lab 10/23/14 1908  WBC 9.6  HGB 14.2  HCT 42.4  MCV 84.3  PLT 200   Cardiac Enzymes: No results for input(s): CKTOTAL, CKMB, CKMBINDEX, TROPONINI in the last 168 hours.  BNP (last 3 results) No results for input(s): PROBNP in the last 8760 hours. CBG: No results for input(s): GLUCAP in the last 168 hours.  Radiological Exams on Admission: Dg Chest 2 View  10/23/2014   CLINICAL DATA:  Difficulty breathing  EXAM: CHEST  2 VIEW  COMPARISON:  July 01, 2013  FINDINGS: There is underlying emphysematous change. There is no edema or consolidation. The heart size and pulmonary vascularity are within normal limits. No adenopathy. There is degenerative change in the thoracic spine.  IMPRESSION: Underlying emphysematous change.  No  edema or consolidation.   Electronically Signed   By: Lowella Grip M.D.   On: 10/23/2014 21:05   Ct Head (brain) Wo Contrast  10/23/2014   CLINICAL DATA:  63 year old female with history of dizziness, shortness of breath, numbness and tingling to the face and the entire left side of the body. Visual disturbance.  EXAM: CT HEAD WITHOUT CONTRAST  TECHNIQUE: Contiguous axial images were obtained from the base of the skull through the vertex without intravenous contrast.  COMPARISON:  Head CT 12/02/2012.  FINDINGS: In the medial aspect of the right temporal lobe best demonstrated on images 13 and 14 of series 2 there is a area of low attenuation, which could represent age-indeterminate ischemia. No other acute intracranial abnormalities. Specifically, no evidence of acute intracranial hemorrhage, no mass, mass effect, hydrocephalus or abnormal intra or extra-axial fluid collections. Visualized paranasal sinuses and mastoids are well pneumatized. No acute displaced skull fractures are identified.  IMPRESSION: 1. Unusual area of low attenuation in the medial aspect of the right temporal lobe. Although this could conceivably represent some beam hardening artifact from the skull base, this is typically seen at lower levels, and findings are suspicious for potential age-indeterminate ischemia. This could be better evaluated with followup MRI of the brain with and without IV gadolinium. These results were called by telephone at the time of interpretation on 10/23/2014 at 8:30 pm to Dr. Leonides Schanz, who verbally acknowledged these results.   Electronically Signed   By: Vinnie Langton M.D.   On: 10/23/2014 20:33     Assessment/Plan Active Problems:   Hypertension, essential   Fibromyalgia   TIA (transient ischemic attack)   1. TIA -patient will be admitted for observation -will get MRI MRA -will get echo -will get carotid doppler  2. Hypertension -place nitropaste to improve pressures -resume home  medications  3. Fibromyalgia -pain control  4. Diabetes Mellitus -will continue with home medications -monitor FSBS -sliding scale     Code Status: Full Code (must indicate code status--if unknown or must be presumed, indicate so) DVT Prophylaxis:Heparin Family Communication: Friend (indicate person spoken with, if applicable, with phone number if by telephone) Disposition Plan: Home (indicate anticipated LOS)  Time spent: 60mn  Lynell Greenhouse A [facility] Pager [phone]

## 2014-10-23 NOTE — ED Provider Notes (Signed)
CSN: [id number]     Arrival date & time 10/23/14  1815 History   First MD Initiated Contact with Patient 10/23/14 2306     Chief Complaint  Patient presents with  . Dizziness  . Shortness of Breath     (Consider location/radiation/quality/duration/timing/severity/associated sxs/prior Treatment) Patient is a 63 y.o. female presenting with dizziness and shortness of breath. The history is provided by the patient.  Dizziness Associated symptoms: shortness of breath   Associated symptoms: no chest pain, no headaches and no vomiting   Shortness of Breath Associated symptoms: no abdominal pain, no chest pain, no fever, no headaches, no neck pain, no rash, no sore throat and no vomiting   pt with hx htn, dm, c/o initially visual symptoms/halos 2 days ago, then states developed left arm and leg numbness/tingling/decreased sensation for the past 1-2 days. Visual symptoms have resolved. Numbness persistent/constant. States had felt somewhat weak as well, however states that is general, bilateral. Denies loss of functional ability. No change in vision or speech. No headaches. Denies neck pain or radicular pain. No fever or chills. Pt also c/o vague sense of dizziness earlier which she describes as lightheadedness. No hearing loss, tinnitus, or vertigo.          Past Medical History  Diagnosis Date  . Hypertension   . Coronary artery disease   . Diabetes mellitus without complication   . Arthritis   . Fibromyalgia   . Irritable bowel   . Anxiety   . Depression   . Fibromyalgia 1995   Past Surgical History  Procedure Laterality Date  . Carotid stent    . Cholecystectomy     Family History  Problem Relation Age of Onset  . Cancer Mother   . Diabetes Mother   . Hypertension Mother   . Heart failure Mother   . Cancer Father   . Diabetes Father   . Hypertension Father   . Heart failure Father   . CAD Father   . CAD Mother   . CAD Brother   . CAD Sister    History  Substance  Use Topics  . Smoking status: Never Smoker   . Smokeless tobacco: Not on file  . Alcohol Use: No   OB History    Gravida Para Term Preterm AB TAB SAB Ectopic Multiple Living   '12 4 4  8          ' Review of Systems  Constitutional: Negative for fever and chills.  HENT: Negative for sore throat and trouble swallowing.   Eyes: Negative for pain and visual disturbance.  Respiratory: Positive for shortness of breath.   Cardiovascular: Negative for chest pain and leg swelling.  Gastrointestinal: Negative for vomiting and abdominal pain.  Endocrine: Negative for polyuria.  Genitourinary: Negative for dysuria and flank pain.  Musculoskeletal: Negative for back pain and neck pain.  Skin: Negative for rash.  Neurological: Positive for dizziness and numbness. Negative for headaches.  Hematological: Does not bruise/bleed easily.  Psychiatric/Behavioral: Negative for confusion.      Allergies  Glimepiride and Sulfa antibiotics  Home Medications   Prior to Admission medications   Medication Sig Start Date End Date Taking? Authorizing Provider  amLODipine (NORVASC) 5 MG tablet Take 1 tablet (5 mg total) by mouth daily. 07/17/14  Yes Lance Bosch, NP  aspirin EC 81 MG tablet Take 81 mg by mouth every morning.   Yes Historical Provider, MD  clopidogrel (PLAVIX) 75 MG tablet Take 1 tablet (75 mg total)  by mouth daily with breakfast. 05/16/14  Yes Candee Furbish, MD  gabapentin (NEURONTIN) 100 MG capsule Take 100 mg by mouth daily as needed (pain).   Yes Historical Provider, MD  glimepiride (AMARYL) 2 MG tablet Take 1 tablet (2 mg total) by mouth 2 (two) times daily. 07/17/14  Yes Lance Bosch, NP  glucose blood test strip Check blood sugar before each meal and at bedtime total 4 times per day 07/19/14  Yes Lance Bosch, NP  glucose monitoring kit (FREESTYLE) monitoring kit Test blood sugars 3 times per day as prescribed 07/31/14  Yes Lance Bosch, NP  hydrOXYzine (ATARAX/VISTARIL) 10 MG tablet  Take 1 tablet (10 mg total) by mouth 3 (three) times daily as needed. Patient taking differently: Take 10 mg by mouth 3 (three) times daily as needed for itching or anxiety.  07/17/14  Yes Lance Bosch, NP  isosorbide mononitrate (IMDUR) 30 MG 24 hr tablet TAKE 1 TABLET (30 MG TOTAL) BY MOUTH EVERY MORNING. 07/26/14  Yes Lance Bosch, NP  Lancets (FREESTYLE) lancets Check blood sugar four times per day. Before each meal and at bedtime 07/19/14  Yes Lance Bosch, NP  levothyroxine (SYNTHROID, LEVOTHROID) 25 MCG tablet TAKE 1 TABLET (25 MCG TOTAL) BY MOUTH DAILY BEFORE BREAKFAST. 07/26/14  Yes Lance Bosch, NP  metoprolol (LOPRESSOR) 50 MG tablet Take 1 tablet (50 mg total) by mouth 2 (two) times daily. For blood pressure 05/09/14  Yes Candee Furbish, MD  nitroGLYCERIN (NITROSTAT) 0.4 MG SL tablet Place 1 tablet (0.4 mg total) under the tongue every 5 (five) minutes as needed for chest pain. 03/14/14  Yes Lance Bosch, NP  omeprazole (PRILOSEC) 20 MG capsule Take 1 capsule (20 mg total) by mouth daily. For acid reflux Patient taking differently: Take 20 mg by mouth daily as needed (acid reflux). For acid reflux 04/13/14  Yes Lance Bosch, NP  pravastatin (PRAVACHOL) 20 MG tablet Take 1 tablet (20 mg total) by mouth daily. 07/17/14  Yes Lance Bosch, NP  triamcinolone cream (KENALOG) 0.1 % Apply 1 application topically 2 (two) times daily. Do not apply to face Patient taking differently: Apply 1 application topically 2 (two) times daily as needed (rash/itching). Do not apply to face 04/13/14  Yes Lance Bosch, NP  gabapentin (NEURONTIN) 100 MG capsule TAKE 1 CAPSULE (100 MG TOTAL) BY MOUTH 2 (TWO) TIMES DAILY. Patient not taking: Reported on 10/23/2014 07/26/14   Lance Bosch, NP  gabapentin (NEURONTIN) 300 MG capsule Take 1 capsule (300 mg total) by mouth 3 (three) times daily. Patient not taking: Reported on 10/23/2014 07/19/14   Lance Bosch, NP  hydrOXYzine (ATARAX/VISTARIL) 25 MG tablet TAKE 1  TABLET (25 MG TOTAL) BY MOUTH 3 (THREE) TIMES DAILY AS NEEDED FOR ITCHING. Patient not taking: Reported on 10/23/2014 07/26/14   Lance Bosch, NP  vitamin E (VITAMIN E) 400 UNIT capsule Take 400 Units by mouth every morning.    Historical Provider, MD   BP 170/84 mmHg  Pulse 74  Temp(Src) 98.3 F (36.8 C) (Oral)  Resp 22  SpO2 99% Physical Exam  Constitutional: She is oriented to person, place, and time. She appears well-developed and well-nourished. No distress.  HENT:  Head: Atraumatic.  Mouth/Throat: Oropharynx is clear and moist.  Eyes: Conjunctivae and EOM are normal. Pupils are equal, round, and reactive to light. No scleral icterus.  Neck: Normal range of motion. Neck supple. No tracheal deviation present.  No bruit  Cardiovascular: Normal rate, regular rhythm, normal heart sounds and intact distal pulses.  Exam reveals no gallop and no friction rub.   No murmur heard. Pulmonary/Chest: Effort normal and breath sounds normal. No respiratory distress.  Abdominal: Soft. Normal appearance and bowel sounds are normal. She exhibits no distension. There is no tenderness.  Genitourinary:  No cva tenderness  Musculoskeletal: She exhibits no edema or tenderness.  Neurological: She is alert and oriented to person, place, and time. No cranial nerve deficit.  Motor intact bil, stre 5/5 bil. No pronator drift. Finger to nose and heel to shin normal bil. sens grossly intact however subj dec on LUE/LLE  Skin: Skin is warm and dry. No rash noted. She is not diaphoretic.  Psychiatric: She has a normal mood and affect.  Nursing note and vitals reviewed.   ED Course  Procedures (including critical care time) Labs Review  Results for orders placed or performed during the hospital encounter of 05/39/76  Basic metabolic panel  Result Value Ref Range   Sodium 140 137 - 147 mEq/L   Potassium 4.3 3.7 - 5.3 mEq/L   Chloride 100 96 - 112 mEq/L   CO2 26 19 - 32 mEq/L   Glucose, Bld 138 (H) 70 -  99 mg/dL   BUN 14 6 - 23 mg/dL   Creatinine, Ser 0.79 0.50 - 1.10 mg/dL   Calcium 10.3 8.4 - 10.5 mg/dL   GFR calc non Af Amer 86 (L) >90 mL/min   GFR calc Af Amer >90 >90 mL/min   Anion gap 14 5 - 15  CBC  Result Value Ref Range   WBC 9.6 4.0 - 10.5 K/uL   RBC 5.03 3.87 - 5.11 MIL/uL   Hemoglobin 14.2 12.0 - 15.0 g/dL   HCT 42.4 36.0 - 46.0 %   MCV 84.3 78.0 - 100.0 fL   MCH 28.2 26.0 - 34.0 pg   MCHC 33.5 30.0 - 36.0 g/dL   RDW 13.4 11.5 - 15.5 %   Platelets 200 150 - 400 K/uL  Protime-INR  Result Value Ref Range   Prothrombin Time 14.4 11.6 - 15.2 seconds   INR 1.10 0.00 - 1.49  APTT  Result Value Ref Range   aPTT 29 24 - 37 seconds  I-stat troponin, ED (not at [facility])  Result Value Ref Range   Troponin i, poc 0.00 0.00 - 0.08 ng/mL   Comment 3           Dg Chest 2 View  10/23/2014   CLINICAL DATA:  Difficulty breathing  EXAM: CHEST  2 VIEW  COMPARISON:  July 01, 2013  FINDINGS: There is underlying emphysematous change. There is no edema or consolidation. The heart size and pulmonary vascularity are within normal limits. No adenopathy. There is degenerative change in the thoracic spine.  IMPRESSION: Underlying emphysematous change.  No edema or consolidation.   Electronically Signed   By: Lowella Grip M.D.   On: 10/23/2014 21:05   Ct Head (brain) Wo Contrast  10/23/2014   CLINICAL DATA:  63 year old female with history of dizziness, shortness of breath, numbness and tingling to the face and the entire left side of the body. Visual disturbance.  EXAM: CT HEAD WITHOUT CONTRAST  TECHNIQUE: Contiguous axial images were obtained from the base of the skull through the vertex without intravenous contrast.  COMPARISON:  Head CT 12/02/2012.  FINDINGS: In the medial aspect of the right temporal lobe best demonstrated on images 13 and 14 of series 2 there is  a area of low attenuation, which could represent age-indeterminate ischemia. No other acute intracranial abnormalities.  Specifically, no evidence of acute intracranial hemorrhage, no mass, mass effect, hydrocephalus or abnormal intra or extra-axial fluid collections. Visualized paranasal sinuses and mastoids are well pneumatized. No acute displaced skull fractures are identified.  IMPRESSION: 1. Unusual area of low attenuation in the medial aspect of the right temporal lobe. Although this could conceivably represent some beam hardening artifact from the skull base, this is typically seen at lower levels, and findings are suspicious for potential age-indeterminate ischemia. This could be better evaluated with followup MRI of the brain with and without IV gadolinium. These results were called by telephone at the time of interpretation on 10/23/2014 at 8:30 pm to Dr. Leonides Schanz, who verbally acknowledged these results.   Electronically Signed   By: Vinnie Langton M.D.   On: 10/23/2014 20:33        EKG Interpretation   Date/Time:  Tuesday October 23 2014 19:10:17 EST Ventricular Rate:  61 PR Interval:  122 QRS Duration: 90 QT Interval:  450 QTC Calculation: 453 R Axis:   47 Text Interpretation:  Normal sinus rhythm with sinus arrhythmia  Nonspecific ST and T wave abnormality No significant change since last  tracing Confirmed by Ashok Cordia  MD, Lennette Bihari ([id number]) on 10/23/2014 11:42:03 PM      MDM   Iv ns. Continuous pulse ox and monitor. o2 [place]. Labs. Ct.  Reviewed nursing notes and prior charts for additional history.   hospitalist paged for admission.   hospitalist requests neuro consult - called - they indicate they will eval pt, agree w order of ct and admit to hospitalist.       Mirna Mires, MD 10/23/14 2351

## 2014-10-24 ENCOUNTER — Observation Stay (HOSPITAL_COMMUNITY): Payer: Medicare Other

## 2014-10-24 DIAGNOSIS — Z882 Allergy status to sulfonamides status: Secondary | ICD-10-CM | POA: Diagnosis not present

## 2014-10-24 DIAGNOSIS — H5340 Unspecified visual field defects: Secondary | ICD-10-CM | POA: Diagnosis present

## 2014-10-24 DIAGNOSIS — E785 Hyperlipidemia, unspecified: Secondary | ICD-10-CM | POA: Diagnosis present

## 2014-10-24 DIAGNOSIS — Z8249 Family history of ischemic heart disease and other diseases of the circulatory system: Secondary | ICD-10-CM | POA: Diagnosis not present

## 2014-10-24 DIAGNOSIS — I10 Essential (primary) hypertension: Secondary | ICD-10-CM | POA: Diagnosis present

## 2014-10-24 DIAGNOSIS — Z7902 Long term (current) use of antithrombotics/antiplatelets: Secondary | ICD-10-CM | POA: Diagnosis not present

## 2014-10-24 DIAGNOSIS — L259 Unspecified contact dermatitis, unspecified cause: Secondary | ICD-10-CM | POA: Diagnosis present

## 2014-10-24 DIAGNOSIS — E039 Hypothyroidism, unspecified: Secondary | ICD-10-CM | POA: Diagnosis present

## 2014-10-24 DIAGNOSIS — R200 Anesthesia of skin: Secondary | ICD-10-CM | POA: Diagnosis present

## 2014-10-24 DIAGNOSIS — Z7982 Long term (current) use of aspirin: Secondary | ICD-10-CM | POA: Diagnosis not present

## 2014-10-24 DIAGNOSIS — I672 Cerebral atherosclerosis: Secondary | ICD-10-CM | POA: Diagnosis present

## 2014-10-24 DIAGNOSIS — E119 Type 2 diabetes mellitus without complications: Secondary | ICD-10-CM | POA: Diagnosis present

## 2014-10-24 DIAGNOSIS — I639 Cerebral infarction, unspecified: Secondary | ICD-10-CM | POA: Diagnosis present

## 2014-10-24 DIAGNOSIS — I2510 Atherosclerotic heart disease of native coronary artery without angina pectoris: Secondary | ICD-10-CM | POA: Diagnosis present

## 2014-10-24 DIAGNOSIS — K219 Gastro-esophageal reflux disease without esophagitis: Secondary | ICD-10-CM | POA: Diagnosis present

## 2014-10-24 DIAGNOSIS — M797 Fibromyalgia: Secondary | ICD-10-CM | POA: Diagnosis present

## 2014-10-24 DIAGNOSIS — Z833 Family history of diabetes mellitus: Secondary | ICD-10-CM | POA: Diagnosis not present

## 2014-10-24 DIAGNOSIS — F419 Anxiety disorder, unspecified: Secondary | ICD-10-CM | POA: Diagnosis present

## 2014-10-24 DIAGNOSIS — Z888 Allergy status to other drugs, medicaments and biological substances status: Secondary | ICD-10-CM | POA: Diagnosis not present

## 2014-10-24 DIAGNOSIS — K589 Irritable bowel syndrome without diarrhea: Secondary | ICD-10-CM | POA: Diagnosis present

## 2014-10-24 DIAGNOSIS — I63531 Cerebral infarction due to unspecified occlusion or stenosis of right posterior cerebral artery: Secondary | ICD-10-CM | POA: Diagnosis present

## 2014-10-24 DIAGNOSIS — G4733 Obstructive sleep apnea (adult) (pediatric): Secondary | ICD-10-CM | POA: Diagnosis present

## 2014-10-24 DIAGNOSIS — R55 Syncope and collapse: Secondary | ICD-10-CM

## 2014-10-24 DIAGNOSIS — Z79899 Other long term (current) drug therapy: Secondary | ICD-10-CM | POA: Diagnosis not present

## 2014-10-24 DIAGNOSIS — I252 Old myocardial infarction: Secondary | ICD-10-CM | POA: Diagnosis not present

## 2014-10-24 LAB — COMPREHENSIVE METABOLIC PANEL
ALT: 17 U/L (ref 0–35)
AST: 24 U/L (ref 0–37)
Albumin: 4.1 g/dL (ref 3.5–5.2)
Alkaline Phosphatase: 77 U/L (ref 39–117)
Anion gap: 16 — ABNORMAL HIGH (ref 5–15)
BILIRUBIN TOTAL: 0.3 mg/dL (ref 0.3–1.2)
BUN: 13 mg/dL (ref 6–23)
CALCIUM: 10 mg/dL (ref 8.4–10.5)
CO2: 25 meq/L (ref 19–32)
CREATININE: 0.75 mg/dL (ref 0.50–1.10)
Chloride: 100 mEq/L (ref 96–112)
GFR, EST NON AFRICAN AMERICAN: 87 mL/min — AB (ref >90)
GLUCOSE: 105 mg/dL — AB (ref 70–99)
Potassium: 4.4 mEq/L (ref 3.7–5.3)
Sodium: 141 mEq/L (ref 137–147)
Total Protein: 7.1 g/dL (ref 6.0–8.3)

## 2014-10-24 LAB — CREATININE, SERUM
CREATININE: 0.72 mg/dL (ref 0.50–1.10)
GFR calc Af Amer: >90 mL/min (ref >90)
GFR calc non Af Amer: 88 mL/min — ABNORMAL LOW (ref >90)

## 2014-10-24 LAB — CBC
HCT: 43.1 % (ref 36.0–46.0)
Hemoglobin: 14.3 g/dL (ref 12.0–15.0)
MCH: 28.7 pg (ref 26.0–34.0)
MCHC: 33.2 g/dL (ref 30.0–36.0)
MCV: 86.4 fL (ref 78.0–100.0)
Platelets: 172 10*3/uL (ref 150–400)
RBC: 4.99 MIL/uL (ref 3.87–5.11)
RDW: 13.6 % (ref 11.5–15.5)
WBC: 7.9 10*3/uL (ref 4.0–10.5)

## 2014-10-24 LAB — GLUCOSE, CAPILLARY
GLUCOSE-CAPILLARY: 93 mg/dL (ref 70–99)
GLUCOSE-CAPILLARY: 198 mg/dL — AB (ref 70–99)
GLUCOSE-CAPILLARY: 210 mg/dL — AB (ref 70–99)
Glucose-Capillary: 310 mg/dL — ABNORMAL HIGH (ref 70–99)
Glucose-Capillary: 244 mg/dL — ABNORMAL HIGH (ref 70–99)

## 2014-10-24 LAB — HEMOGLOBIN A1C
HEMOGLOBIN A1C: 7.9 % — AB (ref <5.7)
Hgb A1c MFr Bld: 7.7 % — ABNORMAL HIGH (ref <5.7)
MEAN PLASMA GLUCOSE: 180 mg/dL — AB (ref <117)
Mean Plasma Glucose: 174 mg/dL — ABNORMAL HIGH (ref <117)

## 2014-10-24 LAB — LIPID PANEL
Cholesterol: 208 mg/dL — ABNORMAL HIGH (ref 0–200)
HDL: 47 mg/dL (ref >39)
LDL CALC: 111 mg/dL — AB (ref 0–99)
Total CHOL/HDL Ratio: 4.4 RATIO
Triglycerides: 249 mg/dL — ABNORMAL HIGH (ref <150)
VLDL: 50 mg/dL — ABNORMAL HIGH (ref 0–40)

## 2014-10-24 LAB — RAPID URINE DRUG SCREEN, HOSP PERFORMED
Amphetamines: NOT DETECTED
Barbiturates: NOT DETECTED
Benzodiazepines: NOT DETECTED
COCAINE: NOT DETECTED
OPIATES: NOT DETECTED
TETRAHYDROCANNABINOL: NOT DETECTED

## 2014-10-24 MED ORDER — ASPIRIN 325 MG PO TABS
325.0000 mg | ORAL_TABLET | Freq: Every day | ORAL | Status: DC
  Administered 2014-10-24 – 2014-10-26 (×3): 325 mg via ORAL
  Filled 2014-10-24 (×3): qty 1

## 2014-10-24 MED ORDER — ALPRAZOLAM 0.5 MG PO TABS: 0.5000 mg | ORAL_TABLET | Freq: Once | ORAL | Status: DC

## 2014-10-24 MED ORDER — GABAPENTIN 100 MG PO CAPS: 100.0000 mg | ORAL_CAPSULE | Freq: Every day | ORAL | Status: DC | PRN

## 2014-10-24 MED ORDER — METOPROLOL TARTRATE 50 MG PO TABS
50.0000 mg | ORAL_TABLET | Freq: Two times a day (BID) | ORAL | Status: DC
  Administered 2014-10-24 (×2): 50 mg via ORAL
  Filled 2014-10-24 (×2): qty 1

## 2014-10-24 MED ORDER — SODIUM CHLORIDE 0.9 % IV SOLN: 250.0000 mL | INTRAVENOUS | Status: DC | PRN

## 2014-10-24 MED ORDER — PANTOPRAZOLE SODIUM 40 MG PO TBEC
40.0000 mg | DELAYED_RELEASE_TABLET | Freq: Every day | ORAL | Status: DC
  Administered 2014-10-24 – 2014-10-26 (×3): 40 mg via ORAL
  Filled 2014-10-24 (×4): qty 1

## 2014-10-24 MED ORDER — HYDRALAZINE HCL 20 MG/ML IJ SOLN
10.0000 mg | Freq: Once | INTRAMUSCULAR | Status: AC
  Administered 2014-10-24: 10 mg via INTRAVENOUS
  Filled 2014-10-24: qty 1

## 2014-10-24 MED ORDER — ACETAMINOPHEN 325 MG PO TABS
650.0000 mg | ORAL_TABLET | ORAL | Status: DC | PRN
Start: 2014-10-24 — End: 2014-10-27
  Administered 2014-10-24 – 2014-10-26 (×6): 650 mg via ORAL
  Filled 2014-10-24 (×7): qty 2

## 2014-10-24 MED ORDER — LORAZEPAM 2 MG/ML IJ SOLN
0.5000 mg | Freq: Once | INTRAMUSCULAR | Status: AC | PRN
  Administered 2014-10-24: 0.5 mg via INTRAVENOUS

## 2014-10-24 MED ORDER — SODIUM CHLORIDE 0.9 % IJ SOLN
3.0000 mL | Freq: Two times a day (BID) | INTRAMUSCULAR | Status: DC
  Administered 2014-10-24 – 2014-10-26 (×6): 3 mL via INTRAVENOUS

## 2014-10-24 MED ORDER — NITROGLYCERIN 0.4 MG SL SUBL: 0.4000 mg | SUBLINGUAL_TABLET | SUBLINGUAL | Status: DC | PRN

## 2014-10-24 MED ORDER — TRIAMCINOLONE ACETONIDE 0.1 % EX CREA: 1.0000 "application " | TOPICAL_CREAM | Freq: Two times a day (BID) | CUTANEOUS | Status: DC | PRN

## 2014-10-24 MED ORDER — HYDRALAZINE HCL 20 MG/ML IJ SOLN
10.0000 mg | Freq: Four times a day (QID) | INTRAMUSCULAR | Status: DC | PRN
  Administered 2014-10-24: 10 mg via INTRAVENOUS
  Filled 2014-10-24: qty 1

## 2014-10-24 MED ORDER — SODIUM CHLORIDE 0.9 % IJ SOLN
3.0000 mL | INTRAMUSCULAR | Status: DC | PRN
  Administered 2014-10-25: 3 mL via INTRAVENOUS
  Filled 2014-10-24: qty 3

## 2014-10-24 MED ORDER — INSULIN ASPART 100 UNIT/ML ~~LOC~~ SOLN
0.0000 [IU] | Freq: Three times a day (TID) | SUBCUTANEOUS | Status: DC
Start: 2014-10-24 — End: 2014-10-27
  Administered 2014-10-24: 11 [IU] via SUBCUTANEOUS
  Administered 2014-10-24 – 2014-10-25 (×2): 3 [IU] via SUBCUTANEOUS
  Administered 2014-10-25: 5 [IU] via SUBCUTANEOUS
  Administered 2014-10-26: 2 [IU] via SUBCUTANEOUS
  Administered 2014-10-26 (×2): 3 [IU] via SUBCUTANEOUS

## 2014-10-24 MED ORDER — NITROGLYCERIN 2 % TD OINT
1.0000 [in_us] | TOPICAL_OINTMENT | Freq: Four times a day (QID) | TRANSDERMAL | Status: DC
  Administered 2014-10-24 – 2014-10-26 (×11): 1 [in_us] via TOPICAL
  Filled 2014-10-24 (×2): qty 30

## 2014-10-24 MED ORDER — HYDROXYZINE HCL 10 MG PO TABS
10.0000 mg | ORAL_TABLET | Freq: Three times a day (TID) | ORAL | Status: DC | PRN
  Filled 2014-10-24: qty 1

## 2014-10-24 MED ORDER — LORAZEPAM 2 MG/ML IJ SOLN
0.5000 mg | Freq: Once | INTRAMUSCULAR | Status: AC
  Administered 2014-10-24: 0.5 mg via INTRAVENOUS
  Filled 2014-10-24: qty 1

## 2014-10-24 MED ORDER — HYDROCODONE-ACETAMINOPHEN 5-325 MG PO TABS
1.0000 | ORAL_TABLET | ORAL | Status: DC | PRN
Start: 2014-10-24 — End: 2014-10-27
  Administered 2014-10-25 – 2014-10-26 (×4): 1 via ORAL
  Filled 2014-10-24 (×6): qty 1

## 2014-10-24 MED ORDER — LEVOTHYROXINE SODIUM 25 MCG PO TABS
25.0000 ug | ORAL_TABLET | Freq: Every day | ORAL | Status: DC
  Administered 2014-10-24 – 2014-10-26 (×3): 25 ug via ORAL
  Filled 2014-10-24 (×3): qty 1

## 2014-10-24 MED ORDER — HEPARIN SODIUM (PORCINE) 5000 UNIT/ML IJ SOLN
5000.0000 [IU] | Freq: Three times a day (TID) | INTRAMUSCULAR | Status: DC
  Administered 2014-10-24 – 2014-10-26 (×7): 5000 [IU] via SUBCUTANEOUS
  Filled 2014-10-24 (×7): qty 1

## 2014-10-24 MED ORDER — AMLODIPINE BESYLATE 5 MG PO TABS
5.0000 mg | ORAL_TABLET | Freq: Every day | ORAL | Status: DC
  Administered 2014-10-24: 5 mg via ORAL
  Filled 2014-10-24: qty 1

## 2014-10-24 MED ORDER — STROKE: EARLY STAGES OF RECOVERY BOOK
Freq: Once | Status: AC
  Administered 2014-10-24: 02:00:00
  Filled 2014-10-24: qty 1

## 2014-10-24 MED ORDER — PRAVASTATIN SODIUM 40 MG PO TABS
40.0000 mg | ORAL_TABLET | Freq: Every day | ORAL | Status: DC
Start: 2014-10-24 — End: 2014-10-27
  Administered 2014-10-24 – 2014-10-25 (×2): 40 mg via ORAL
  Filled 2014-10-24 (×5): qty 1

## 2014-10-24 MED ORDER — ASPIRIN EC 81 MG PO TBEC: 81.0000 mg | DELAYED_RELEASE_TABLET | Freq: Every day | ORAL | Status: DC

## 2014-10-24 MED ORDER — ALPRAZOLAM 0.5 MG PO TABS
0.5000 mg | ORAL_TABLET | Freq: Once | ORAL | Status: AC
  Administered 2014-10-25: 0.5 mg via ORAL
  Filled 2014-10-24: qty 1

## 2014-10-24 MED ORDER — PRAVASTATIN SODIUM 20 MG PO TABS
20.0000 mg | ORAL_TABLET | Freq: Every day | ORAL | Status: DC
  Filled 2014-10-24: qty 1

## 2014-10-24 MED ORDER — GLIMEPIRIDE 4 MG PO TABS
2.0000 mg | ORAL_TABLET | Freq: Two times a day (BID) | ORAL | Status: DC
  Administered 2014-10-24 – 2014-10-26 (×6): 2 mg via ORAL
  Filled 2014-10-24 (×5): qty 1

## 2014-10-24 MED ORDER — CLOPIDOGREL BISULFATE 75 MG PO TABS
75.0000 mg | ORAL_TABLET | Freq: Every day | ORAL | Status: DC
  Administered 2014-10-24 – 2014-10-26 (×3): 75 mg via ORAL
  Filled 2014-10-24 (×3): qty 1

## 2014-10-24 MED ORDER — HYDROCODONE-ACETAMINOPHEN 5-325 MG PO TABS
2.0000 | ORAL_TABLET | Freq: Once | ORAL | Status: AC
  Administered 2014-10-24: 2 via ORAL

## 2014-10-24 MED ORDER — ZOLPIDEM TARTRATE 5 MG PO TABS
5.0000 mg | ORAL_TABLET | Freq: Once | ORAL | Status: AC
  Administered 2014-10-24: 5 mg via ORAL
  Filled 2014-10-24: qty 1

## 2014-10-24 NOTE — ED Notes (Signed)
Per MRI- pt requested to not receive MRI exam.

## 2014-10-24 NOTE — Progress Notes (Signed)
[facility] PROGRESS NOTE  Shannon [name] ZOX:[id number] DOB: 11/01/1951 DOA: 10/23/2014  PCP: Holland CommonsKECK, VALERIE, NP  Brief HPI: 63 year old female presented with weakness and tingling along with numbness on the left side. She was admitted for further workup.  Past medical history:  Past Medical History  Diagnosis Date  . Hypertension   . Coronary artery disease   . Diabetes mellitus without complication   . Arthritis   . Fibromyalgia   . Irritable bowel   . Anxiety   . Depression   . Fibromyalgia 1995    Consultants: Neurology  Procedures:  Carotid Dopplers. Pending.  2-D echocardiogram. Pending  Antibiotics: None  Subjective: Patient still has some tingling and numbness in the left side. Denies any weakness per se. No other symptoms.  Objective: Vital Signs  Filed Vitals:   10/24/14 0400 10/24/14 0515 10/24/14 0800 10/24/14 1000  BP: 187/65 182/112 151/66 178/77  Pulse: 73 72 73 76  Temp: 98.2 F (36.8 C) 98.2 F (36.8 C) 97.9 F (36.6 C) 98.6 F (37 C)  TempSrc: Oral Oral Oral Oral  Resp: 16 16 20 20   Height:      Weight:      SpO2: 99% 97% 98% 98%   No intake or output data in the 24 hours ending 10/24/14 1252 Filed Weights   10/24/14 0130  Weight: 71.7 kg (158 lb 1.1 oz)    General appearance: alert, cooperative, appears stated age and no distress Head: Normocephalic, without obvious abnormality, atraumatic Resp: clear to auscultation bilaterally Cardio: regular rate and rhythm, S1, S2 normal, no murmur, click, rub or gallop GI: soft, non-tender; bowel sounds normal; no masses,  no organomegaly Extremities: extremities normal, atraumatic, no cyanosis or edema Neurologic: Alert and oriented 3. Sensory deficits appreciated on the left. No motor strength deficits appreciated. No facial asymmetry.  Lab Results:  Basic Metabolic Panel:  Recent Labs Lab 10/23/14 1908 10/24/14 0059 10/24/14 0735  NA 140 141  --   K 4.3 4.4  --     CL 100 100  --   CO2 26 25  --   GLUCOSE 138* 105*  --   BUN 14 13  --   CREATININE 0.79 0.75 0.72  CALCIUM 10.3 10.0  --    Liver Function Tests:  Recent Labs Lab 10/24/14 0059  AST 24  ALT 17  ALKPHOS 77  BILITOT 0.3  PROT 7.1  ALBUMIN 4.1   CBC:  Recent Labs Lab 10/23/14 1908 10/24/14 0735  WBC 9.6 7.9  HGB 14.2 14.3  HCT 42.4 43.1  MCV 84.3 86.4  PLT 200 172   CBG:  Recent Labs Lab 10/23/14 2217 10/24/14 0658  GLUCAP 93 198*    Studies/Results: Dg Chest 2 View  10/23/2014   CLINICAL DATA:  Difficulty breathing  EXAM: CHEST  2 VIEW  COMPARISON:  July 01, 2013  FINDINGS: There is underlying emphysematous change. There is no edema or consolidation. The heart size and pulmonary vascularity are within normal limits. No adenopathy. There is degenerative change in the thoracic spine.  IMPRESSION: Underlying emphysematous change.  No edema or consolidation.   Electronically Signed   By: Bretta BangWilliam  Woodruff M.D.   On: 10/23/2014 21:05   Ct Head (brain) Wo Contrast  10/23/2014   CLINICAL DATA:  63 year old female with history of dizziness, shortness of breath, numbness and tingling to the face and the entire left side of the body. Visual disturbance.  EXAM: CT HEAD WITHOUT CONTRAST  TECHNIQUE: Contiguous axial  images were obtained from the base of the skull through the vertex without intravenous contrast.  COMPARISON:  Head CT 12/02/2012.  FINDINGS: In the medial aspect of the right temporal lobe best demonstrated on images 13 and 14 of series 2 there is a area of low attenuation, which could represent age-indeterminate ischemia. No other acute intracranial abnormalities. Specifically, no evidence of acute intracranial hemorrhage, no mass, mass effect, hydrocephalus or abnormal intra or extra-axial fluid collections. Visualized paranasal sinuses and mastoids are well pneumatized. No acute displaced skull fractures are identified.  IMPRESSION: 1. Unusual area of low  attenuation in the medial aspect of the right temporal lobe. Although this could conceivably represent some beam hardening artifact from the skull base, this is typically seen at lower levels, and findings are suspicious for potential age-indeterminate ischemia. This could be better evaluated with followup MRI of the brain with and without IV gadolinium. These results were called by telephone at the time of interpretation on 10/23/2014 at 8:30 pm to Dr. Elesa MassedWard, who verbally acknowledged these results.   Electronically Signed   By: Trudie Reedaniel  Entrikin M.D.   On: 10/23/2014 20:33   Mr Maxine GlennMra Head Wo Contrast  10/24/2014   CLINICAL DATA:  Numbness and tingling of the face and left side of the body. Symptoms developed yesterday.  EXAM: MRI HEAD WITHOUT CONTRAST  MRA HEAD WITHOUT CONTRAST  TECHNIQUE: Multiplanar, multiecho pulse sequences of the brain and surrounding structures were obtained without intravenous contrast. Angiographic images of the head were obtained using MRA technique without contrast.  COMPARISON:  Head CT 10/23/2014  FINDINGS: MRI HEAD FINDINGS  Diffusion imaging shows clustered foci of acute infarction affecting the right posterior medial temporal lobe, occipital lobe, hippocampus, and minimal punctate involvement of the lateral thalamus, consistent with infarction in the right posterior cerebral artery territory. No other acute infarction.  The cerebellum is unremarkable. The left cerebral hemisphere is normal. There is no mass lesion, hemorrhage, hydrocephalus or extra-axial collection. No pituitary mass. No inflammatory sinus disease. No skull or skullbase lesion.  MRA HEAD FINDINGS  Both internal carotid arteries are patent into the brain. There is mild irregularity in the carotid siphon regions. There is narrowing and irregularity of the supra clinoid internal carotid arteries, proximal anterior and middle cerebral arteries. The A1 segment on the left is hypoplastic or severely disease.  Both  vertebral arteries are patent to the basilar. No basilar stenosis. Posterior inferior cerebellar arteries show flow bilaterally. Superior cerebellar arteries show flow bilaterally. The left PCA is widely patent. The right PCA shows diminished distal perfusion beyond 1.5 cm.  IMPRESSION: Acute infarction in the right PCA territory affecting the posterior medial temporal lobe, occipital lobe, hippocampus and to a minimal extent the right thalamus. No mass effect or hemorrhage. MR angiography shows occlusion or markedly diminished flow in the right posterior cerebral artery. Additionally, there is narrowing and irregularity in the proximal anterior and middle cerebral arteries, placing the patient at risk of anterior circulation infarction in the future.  Despite this extensive vascular disease, this acute right posterior cerebral artery territory infarction appears to be the patient's only previous cerebrovascular event.   Electronically Signed   By: Paulina FusiMark  Shogry M.D.   On: 10/24/2014 11:58   Mr Brain Wo Contrast  10/24/2014   CLINICAL DATA:  Numbness and tingling of the face and left side of the body. Symptoms developed yesterday.  EXAM: MRI HEAD WITHOUT CONTRAST  MRA HEAD WITHOUT CONTRAST  TECHNIQUE: Multiplanar, multiecho pulse sequences of the  brain and surrounding structures were obtained without intravenous contrast. Angiographic images of the head were obtained using MRA technique without contrast.  COMPARISON:  Head CT 10/23/2014  FINDINGS: MRI HEAD FINDINGS  Diffusion imaging shows clustered foci of acute infarction affecting the right posterior medial temporal lobe, occipital lobe, hippocampus, and minimal punctate involvement of the lateral thalamus, consistent with infarction in the right posterior cerebral artery territory. No other acute infarction.  The cerebellum is unremarkable. The left cerebral hemisphere is normal. There is no mass lesion, hemorrhage, hydrocephalus or extra-axial collection. No  pituitary mass. No inflammatory sinus disease. No skull or skullbase lesion.  MRA HEAD FINDINGS  Both internal carotid arteries are patent into the brain. There is mild irregularity in the carotid siphon regions. There is narrowing and irregularity of the supra clinoid internal carotid arteries, proximal anterior and middle cerebral arteries. The A1 segment on the left is hypoplastic or severely disease.  Both vertebral arteries are patent to the basilar. No basilar stenosis. Posterior inferior cerebellar arteries show flow bilaterally. Superior cerebellar arteries show flow bilaterally. The left PCA is widely patent. The right PCA shows diminished distal perfusion beyond 1.5 cm.  IMPRESSION: Acute infarction in the right PCA territory affecting the posterior medial temporal lobe, occipital lobe, hippocampus and to a minimal extent the right thalamus. No mass effect or hemorrhage. MR angiography shows occlusion or markedly diminished flow in the right posterior cerebral artery. Additionally, there is narrowing and irregularity in the proximal anterior and middle cerebral arteries, placing the patient at risk of anterior circulation infarction in the future.  Despite this extensive vascular disease, this acute right posterior cerebral artery territory infarction appears to be the patient's only previous cerebrovascular event.   Electronically Signed   By: Paulina Fusi M.D.   On: 10/24/2014 11:58    Medications:  Scheduled: . amLODipine  5 mg Oral Daily  . aspirin  325 mg Oral Daily  . clopidogrel  75 mg Oral Q breakfast  . glimepiride  2 mg Oral BID WC  . heparin  5,000 Units Subcutaneous 3 times per day  . insulin aspart  0-15 Units Subcutaneous TID WC  . levothyroxine  25 mcg Oral QAC breakfast  . metoprolol  50 mg Oral BID  . nitroGLYCERIN  1 inch Topical 4 times per day  . pantoprazole  40 mg Oral Daily  . pravastatin  20 mg Oral q1800  . sodium chloride  3 mL Intravenous Q12H   Continuous:    UJW:[id number] chloride, acetaminophen, gabapentin, hydrOXYzine, nitroGLYCERIN, sodium chloride, triamcinolone cream  Assessment/Plan:  Principal Problem:   Acute CVA (cerebrovascular accident) Active Problems:   Hypertension, essential   Fibromyalgia   Acute stroke in the right PCA territory Stroke workup is in progress. Neurology to see. She was taking both aspirin and Plavix at home. These are being continued. PT and OT to evaluate. LDL is 111. She is already on a statin. The dose will be increased. Echo and carotid Dopplers are pending.   Accelerated Hypertension Allow permissive hypertension. Monitor blood pressures closely. Hydralazine as needed for significantly elevated BPs.  History of diabetes mellitus type 2. Continue sliding scale coverage. Continue oral agents.  History of hypothyroidism. Continue with levothyroxine.  History of fibromyalgia. Continue home medication regimen.   DVT Prophylaxis: Heparin    Code Status: Full code  Family Communication: Discussed with the patient  Disposition Plan: Not ready for discharge. PT and OT are pending.    LOS: 1 day  [facility]  [facility] Pager [phone] 10/24/2014, 12:52 PM  If 8PM-8AM, please contact night-coverage at [email or web address], password [email or web address]

## 2014-10-24 NOTE — Consult Note (Signed)
Referring Physician: Dr. Humphrey Rolls, ED    Chief Complaint: left sided paresthesias.  HPI:                                                                                                                                         Shannon [name] is an 63 y.o. female with a past medical history that is relevant for HTN, DM, CAD, s/p carotid stenting, depression, anxiety, fibromyalgia, presents for further evaluation of the above stated symptoms. She indicated that two days ago she developed sudden onset of visual changes that she describes as " seeing bright lights moving around" which lasted less than a hour. Then, the next day she noticed tingling and numbness of her left face-lips-arm and legs that became more prominent today. Denies associated HA, vertigo, double vision, difficulty swallowing, focal weakness, unsteadiness, slurred speech, confusion, or language impairment. CT brain tonight revealed an area of low attenuation in the medial aspect of the right temporal lobe suspicious for potentialage-indeterminate ischemia.  Takes aspirin daily. Date last known well: uncertain Time last known well: uncertain tPA Given: no, out of the window.   Past Medical History  Diagnosis Date  . Hypertension   . Coronary artery disease   . Diabetes mellitus without complication   . Arthritis   . Fibromyalgia   . Irritable bowel   . Anxiety   . Depression   . Fibromyalgia 1995    Past Surgical History  Procedure Laterality Date  . Carotid stent    . Cholecystectomy      Family History  Problem Relation Age of Onset  . Cancer Mother   . Diabetes Mother   . Hypertension Mother   . Heart failure Mother   . Cancer Father   . Diabetes Father   . Hypertension Father   . Heart failure Father   . CAD Father   . CAD Mother   . CAD Brother   . CAD Sister    Social History:  reports that she has never smoked. She does not have any smokeless tobacco history on file. She reports that she does not  drink alcohol or use illicit drugs.  Allergies:  Allergies  Allergen Reactions  . Glimepiride     DIARRHEA GIVES PROBLEMS WITH IBS  . Sulfa Antibiotics Other (See Comments)    Childhood allergy    Medications:  I have reviewed the patient's current medications.  ROS:                                                                                                                                       History obtained from the patient and chart review.  General ROS: negative for - chills, fatigue, fever, night sweats, weight gain or weight loss Psychological ROS: negative for - behavioral disorder, hallucinations, memory difficulties, mood swings or suicidal ideation Ophthalmic ROS: negative for - blurry vision, double vision, eye pain or loss of vision ENT ROS: negative for - epistaxis, nasal discharge, oral lesions, sore throat, tinnitus or vertigo Allergy and Immunology ROS: negative for - hives or itchy/watery eyes Hematological and Lymphatic ROS: negative for - bleeding problems, bruising or swollen lymph nodes Endocrine ROS: negative for - galactorrhea, hair pattern changes, polydipsia/polyuria or temperature intolerance Respiratory ROS: negative for - cough, hemoptysis, shortness of breath or wheezing Cardiovascular ROS: negative for - chest pain, dyspnea on exertion, edema or irregular heartbeat Gastrointestinal ROS: negative for - abdominal pain, diarrhea, hematemesis, nausea/vomiting or stool incontinence Genito-Urinary ROS: negative for - dysuria, hematuria, incontinence or urinary frequency/urgency Musculoskeletal ROS: negative for - joint swelling or muscular weakness Neurological ROS: as noted in HPI Dermatological ROS: negative for rash and skin lesion changes  Physical exam: pleasant female in no apparent distress. Blood pressure 196/89, pulse 59,  temperature 98.3 F (36.8 C), temperature source Oral, resp. rate 15, SpO2 100 %. Head: normocephalic. Neck: supple, no bruits, no JVD. Cardiac: no murmurs. Lungs: clear. Abdomen: soft, no tender, no mass. Extremities: no edema. Neurologic Examination:                                                                                                      General: Mental Status: Alert, oriented, thought content appropriate.  Speech fluent without evidence of aphasia.  Able to follow 3 step commands without difficulty. Cranial Nerves: II: Discs flat bilaterally; Visual fields grossly normal, pupils equal, round, reactive to light and accommodation III,IV, VI: ptosis not present, extra-ocular motions intact bilaterally V,VII: smile symmetric, facial light touch sensation normal bilaterally VIII: hearing normal bilaterally IX,X: gag reflex present XI: bilateral shoulder shrug XII: midline tongue extension without atrophy or fasciculations  Motor: Right : Upper extremity   5/5    Left:     Upper extremity   5/5  Lower extremity   5/5     Lower extremity   5/5 Tone and bulk:normal tone  throughout; no atrophy noted Sensory: Pinprick and light touch intact throughout, bilaterally Deep Tendon Reflexes:  Right: Upper Extremity   Left: Upper extremity   biceps (C-5 to C-6) 2/4   biceps (C-5 to C-6) 2/4 tricep (C7) 2/4    triceps (C7) 2/4 Brachioradialis (C6) 2/4  Brachioradialis (C6) 2/4  Lower Extremity Lower Extremity  quadriceps (L-2 to L-4) 2/4   quadriceps (L-2 to L-4) 2/4 Achilles (S1) 2/4   Achilles (S1) 2/4  Plantars: Right: downgoing   Left: downgoing Cerebellar: normal finger-to-nose,  normal heel-to-shin test Gait:  No ataxia     Results for orders placed or performed during the hospital encounter of 10/23/14 (from the past 48 hour(s))  Basic metabolic panel     Status: Abnormal   Collection Time: 10/23/14  7:08 PM  Result Value Ref Range   Sodium 140 137 - 147 mEq/L    Potassium 4.3 3.7 - 5.3 mEq/L   Chloride 100 96 - 112 mEq/L   CO2 26 19 - 32 mEq/L   Glucose, Bld 138 (H) 70 - 99 mg/dL   BUN 14 6 - 23 mg/dL   Creatinine, Ser 0.79 0.50 - 1.10 mg/dL   Calcium 10.3 8.4 - 10.5 mg/dL   GFR calc non Af Amer 86 (L) >90 mL/min   GFR calc Af Amer >90 >90 mL/min    Comment: (NOTE) The eGFR has been calculated using the CKD EPI equation. This calculation has not been validated in all clinical situations. eGFR's persistently <90 mL/min signify possible Chronic Kidney Disease.    Anion gap 14 5 - 15  CBC     Status: None   Collection Time: 10/23/14  7:08 PM  Result Value Ref Range   WBC 9.6 4.0 - 10.5 K/uL   RBC 5.03 3.87 - 5.11 MIL/uL   Hemoglobin 14.2 12.0 - 15.0 g/dL   HCT 42.4 36.0 - 46.0 %   MCV 84.3 78.0 - 100.0 fL   MCH 28.2 26.0 - 34.0 pg   MCHC 33.5 30.0 - 36.0 g/dL   RDW 13.4 11.5 - 15.5 %   Platelets 200 150 - 400 K/uL  Protime-INR     Status: None   Collection Time: 10/23/14  7:08 PM  Result Value Ref Range   Prothrombin Time 14.4 11.6 - 15.2 seconds   INR 1.10 0.00 - 1.49  APTT     Status: None   Collection Time: 10/23/14  7:08 PM  Result Value Ref Range   aPTT 29 24 - 37 seconds  I-stat troponin, ED (not at [facility])     Status: None   Collection Time: 10/23/14  7:28 PM  Result Value Ref Range   Troponin i, poc 0.00 0.00 - 0.08 ng/mL   Comment 3            Comment: Due to the release kinetics of cTnI, a negative result within the first hours of the onset of symptoms does not rule out myocardial infarction with certainty. If myocardial infarction is still suspected, repeat the test at appropriate intervals.    Dg Chest 2 View  10/23/2014   CLINICAL DATA:  Difficulty breathing  EXAM: CHEST  2 VIEW  COMPARISON:  July 01, 2013  FINDINGS: There is underlying emphysematous change. There is no edema or consolidation. The heart size and pulmonary vascularity are within normal limits. No adenopathy. There is degenerative change in the  thoracic spine.  IMPRESSION: Underlying emphysematous change.  No edema or consolidation.   Electronically Signed  By: Lowella Grip M.D.   On: 10/23/2014 21:05   Ct Head (brain) Wo Contrast  10/23/2014   CLINICAL DATA:  63 year old female with history of dizziness, shortness of breath, numbness and tingling to the face and the entire left side of the body. Visual disturbance.  EXAM: CT HEAD WITHOUT CONTRAST  TECHNIQUE: Contiguous axial images were obtained from the base of the skull through the vertex without intravenous contrast.  COMPARISON:  Head CT 12/02/2012.  FINDINGS: In the medial aspect of the right temporal lobe best demonstrated on images 13 and 14 of series 2 there is a area of low attenuation, which could represent age-indeterminate ischemia. No other acute intracranial abnormalities. Specifically, no evidence of acute intracranial hemorrhage, no mass, mass effect, hydrocephalus or abnormal intra or extra-axial fluid collections. Visualized paranasal sinuses and mastoids are well pneumatized. No acute displaced skull fractures are identified.  IMPRESSION: 1. Unusual area of low attenuation in the medial aspect of the right temporal lobe. Although this could conceivably represent some beam hardening artifact from the skull base, this is typically seen at lower levels, and findings are suspicious for potential age-indeterminate ischemia. This could be better evaluated with followup MRI of the brain with and without IV gadolinium. These results were called by telephone at the time of interpretation on 10/23/2014 at 8:30 pm to Dr. Leonides Schanz, who verbally acknowledged these results.   Electronically Signed   By: Vinnie Langton M.D.   On: 10/23/2014 20:33    Assessment: 63 y.o. female presents with left sided paresthesias since yesterday which were preceded by transient visual disturbances as described above. Neuro-exam is unrevealing at this time but CT brain with findings suspicious for potential  age-indeterminate ischemia affecting right temporal region.  Agree with admission to medicine and complete stroke work up. Continue aspirin for now. Stroke team will follow up in the morning.  Stroke Risk Factors - HTN, DM, CAD, carotid disease  Plan: 1. HgbA1c, fasting lipid panel 2. MRI, MRA  of the brain without contrast 3. Echocardiogram 4. Carotid dopplers 5. Prophylactic therapy-aspirin 6. Risk factor modification 7. Telemetry monitoring 8. Frequent neuro checks 9. PT/OT SLP (no needed at this time)  Dorian Pod, MD [facility] [phone]  10/24/2014, 12:17 AM

## 2014-10-24 NOTE — ED Notes (Signed)
Patient transported to MRI 

## 2014-10-24 NOTE — Progress Notes (Signed)
UR completed 

## 2014-10-24 NOTE — Progress Notes (Signed)
STROKE TEAM PROGRESS NOTE   HISTORY Shannon [name] is an 63 y.o. female with a past medical history that is relevant for HTN, DM, CAD, s/p carotid stenting, depression, anxiety, fibromyalgia, presents for further evaluation of the above stated symptoms. She indicated that two days ago she developed sudden onset of visual changes that she describes as " seeing bright lights moving around" which lasted less than a hour. Then, the next day she noticed tingling and numbness of her left face-lips-arm and legs that became more prominent today. Denies associated HA, vertigo, double vision, difficulty swallowing, focal weakness, unsteadiness, slurred speech, confusion, or language impairment. CT brain tonight revealed an area of low attenuation in the medial aspect of the right temporal lobe suspicious for potentialage-indeterminate ischemia.  Takes aspirin daily. Date last known well: uncertain Time last known well: uncertain tPA Given: no, out of the window.    SUBJECTIVE (INTERVAL HISTORY) No family is at bedside. The patient indicates that she feels well, still has some mild residual numbness on the left side of the body. The patient has been able to ambulate.   OBJECTIVE Temp:  [97.9 F (36.6 C)-98.9 F (37.2 C)] 98.6 F (37 C) (12/16 1000) Pulse Rate:  [59-76] 76 (12/16 1000) Cardiac Rhythm:  [-] Normal sinus rhythm (12/16 1000) Resp:  [12-22] 20 (12/16 1000) BP: (151-227)/(65-112) 178/77 mmHg (12/16 1000) SpO2:  [97 %-100 %] 98 % (12/16 1000) Weight:  [158 lb 1.1 oz (71.7 kg)] 158 lb 1.1 oz (71.7 kg) (12/16 0130)   Recent Labs Lab 10/23/14 2217 10/24/14 0658  GLUCAP 93 198*    Recent Labs Lab 10/23/14 1908 10/24/14 0059 10/24/14 0735  NA 140 141  --   K 4.3 4.4  --   CL 100 100  --   CO2 26 25  --   GLUCOSE 138* 105*  --   BUN 14 13  --   CREATININE 0.79 0.75 0.72  CALCIUM 10.3 10.0  --     Recent Labs Lab 10/24/14 0059  AST 24  ALT 17  ALKPHOS 77   BILITOT 0.3  PROT 7.1  ALBUMIN 4.1    Recent Labs Lab 10/23/14 1908 10/24/14 0735  WBC 9.6 7.9  HGB 14.2 14.3  HCT 42.4 43.1  MCV 84.3 86.4  PLT 200 172   No results for input(s): CKTOTAL, CKMB, CKMBINDEX, TROPONINI in the last 168 hours.  Recent Labs  10/23/14 1908  LABPROT 14.4  INR 1.10   No results for input(s): COLORURINE, LABSPEC, PHURINE, GLUCOSEU, HGBUR, BILIRUBINUR, KETONESUR, PROTEINUR, UROBILINOGEN, NITRITE, LEUKOCYTESUR in the last 72 hours.  Invalid input(s): APPERANCEUR     Component Value Date/Time   CHOL 208* 10/24/2014 0735   TRIG 249* 10/24/2014 0735   HDL 47 10/24/2014 0735   CHOLHDL 4.4 10/24/2014 0735   VLDL 50* 10/24/2014 0735   LDLCALC 111* 10/24/2014 0735   Lab Results  Component Value Date   HGBA1C 7.7* 10/24/2014      Component Value Date/Time   LABOPIA NONE DETECTED 10/24/2014 0154   COCAINSCRNUR NONE DETECTED 10/24/2014 0154   LABBENZ NONE DETECTED 10/24/2014 0154   AMPHETMU NONE DETECTED 10/24/2014 0154   THCU NONE DETECTED 10/24/2014 0154   LABBARB NONE DETECTED 10/24/2014 0154    No results for input(s): ETH in the last 168 hours.  Dg Chest 2 View  10/23/2014   CLINICAL DATA:  Difficulty breathing  EXAM: CHEST  2 VIEW  COMPARISON:  July 01, 2013  FINDINGS: There is underlying emphysematous  change. There is no edema or consolidation. The heart size and pulmonary vascularity are within normal limits. No adenopathy. There is degenerative change in the thoracic spine.  IMPRESSION: Underlying emphysematous change.  No edema or consolidation.   Electronically Signed   By: Bretta BangWilliam  Woodruff M.D.   On: 10/23/2014 21:05   Ct Head (brain) Wo Contrast  10/23/2014   CLINICAL DATA:  63 year old female with history of dizziness, shortness of breath, numbness and tingling to the face and the entire left side of the body. Visual disturbance.  EXAM: CT HEAD WITHOUT CONTRAST  TECHNIQUE: Contiguous axial images were obtained from the base of the  skull through the vertex without intravenous contrast.  COMPARISON:  Head CT 12/02/2012.  FINDINGS: In the medial aspect of the right temporal lobe best demonstrated on images 13 and 14 of series 2 there is a area of low attenuation, which could represent age-indeterminate ischemia. No other acute intracranial abnormalities. Specifically, no evidence of acute intracranial hemorrhage, no mass, mass effect, hydrocephalus or abnormal intra or extra-axial fluid collections. Visualized paranasal sinuses and mastoids are well pneumatized. No acute displaced skull fractures are identified.  IMPRESSION: 1. Unusual area of low attenuation in the medial aspect of the right temporal lobe. Although this could conceivably represent some beam hardening artifact from the skull base, this is typically seen at lower levels, and findings are suspicious for potential age-indeterminate ischemia. This could be better evaluated with followup MRI of the brain with and without IV gadolinium. These results were called by telephone at the time of interpretation on 10/23/2014 at 8:30 pm to Dr. Elesa MassedWard, who verbally acknowledged these results.   Electronically Signed   By: Trudie Reedaniel  Entrikin M.D.   On: 10/23/2014 20:33   Mr Maxine GlennMra Head Wo Contrast  10/24/2014   CLINICAL DATA:  Numbness and tingling of the face and left side of the body. Symptoms developed yesterday.  EXAM: MRI HEAD WITHOUT CONTRAST  MRA HEAD WITHOUT CONTRAST  TECHNIQUE: Multiplanar, multiecho pulse sequences of the brain and surrounding structures were obtained without intravenous contrast. Angiographic images of the head were obtained using MRA technique without contrast.  COMPARISON:  Head CT 10/23/2014  FINDINGS: MRI HEAD FINDINGS  Diffusion imaging shows clustered foci of acute infarction affecting the right posterior medial temporal lobe, occipital lobe, hippocampus, and minimal punctate involvement of the lateral thalamus, consistent with infarction in the right posterior  cerebral artery territory. No other acute infarction.  The cerebellum is unremarkable. The left cerebral hemisphere is normal. There is no mass lesion, hemorrhage, hydrocephalus or extra-axial collection. No pituitary mass. No inflammatory sinus disease. No skull or skullbase lesion.  MRA HEAD FINDINGS  Both internal carotid arteries are patent into the brain. There is mild irregularity in the carotid siphon regions. There is narrowing and irregularity of the supra clinoid internal carotid arteries, proximal anterior and middle cerebral arteries. The A1 segment on the left is hypoplastic or severely disease.  Both vertebral arteries are patent to the basilar. No basilar stenosis. Posterior inferior cerebellar arteries show flow bilaterally. Superior cerebellar arteries show flow bilaterally. The left PCA is widely patent. The right PCA shows diminished distal perfusion beyond 1.5 cm.  IMPRESSION: Acute infarction in the right PCA territory affecting the posterior medial temporal lobe, occipital lobe, hippocampus and to a minimal extent the right thalamus. No mass effect or hemorrhage. MR angiography shows occlusion or markedly diminished flow in the right posterior cerebral artery. Additionally, there is narrowing and irregularity in the proximal anterior  and middle cerebral arteries, placing the patient at risk of anterior circulation infarction in the future.  Despite this extensive vascular disease, this acute right posterior cerebral artery territory infarction appears to be the patient's only previous cerebrovascular event.   Electronically Signed   By: Paulina Fusi M.D.   On: 10/24/2014 11:58   Mr Brain Wo Contrast  10/24/2014   CLINICAL DATA:  Numbness and tingling of the face and left side of the body. Symptoms developed yesterday.  EXAM: MRI HEAD WITHOUT CONTRAST  MRA HEAD WITHOUT CONTRAST  TECHNIQUE: Multiplanar, multiecho pulse sequences of the brain and surrounding structures were obtained without  intravenous contrast. Angiographic images of the head were obtained using MRA technique without contrast.  COMPARISON:  Head CT 10/23/2014  FINDINGS: MRI HEAD FINDINGS  Diffusion imaging shows clustered foci of acute infarction affecting the right posterior medial temporal lobe, occipital lobe, hippocampus, and minimal punctate involvement of the lateral thalamus, consistent with infarction in the right posterior cerebral artery territory. No other acute infarction.  The cerebellum is unremarkable. The left cerebral hemisphere is normal. There is no mass lesion, hemorrhage, hydrocephalus or extra-axial collection. No pituitary mass. No inflammatory sinus disease. No skull or skullbase lesion.  MRA HEAD FINDINGS  Both internal carotid arteries are patent into the brain. There is mild irregularity in the carotid siphon regions. There is narrowing and irregularity of the supra clinoid internal carotid arteries, proximal anterior and middle cerebral arteries. The A1 segment on the left is hypoplastic or severely disease.  Both vertebral arteries are patent to the basilar. No basilar stenosis. Posterior inferior cerebellar arteries show flow bilaterally. Superior cerebellar arteries show flow bilaterally. The left PCA is widely patent. The right PCA shows diminished distal perfusion beyond 1.5 cm.  IMPRESSION: Acute infarction in the right PCA territory affecting the posterior medial temporal lobe, occipital lobe, hippocampus and to a minimal extent the right thalamus. No mass effect or hemorrhage. MR angiography shows occlusion or markedly diminished flow in the right posterior cerebral artery. Additionally, there is narrowing and irregularity in the proximal anterior and middle cerebral arteries, placing the patient at risk of anterior circulation infarction in the future.  Despite this extensive vascular disease, this acute right posterior cerebral artery territory infarction appears to be the patient's only previous  cerebrovascular event.   Electronically Signed   By: Paulina Fusi M.D.   On: 10/24/2014 11:58     Physical Exam  General: The patient is alert and cooperative at the time of the examination.  Skin: No significant peripheral edema is noted.   Neurologic Exam  Mental status: The patient is oriented x 3.  Cranial nerves: Facial symmetry is present. Speech is normal, no aphasia or dysarthria is noted. Extraocular movements are full. Visual fields are full with exception of a left superior quadrantanopsia.  Motor: The patient has good strength in all 4 extremities.  Sensory examination: Soft touch sensation is symmetric on the face, arms, and legs.  Coordination: The patient has good finger-nose-finger and heel-to-shin bilaterally.  Gait and station: The gait was not tested.  Reflexes: Deep tendon reflexes are symmetric.    ASSESSMENT/PLAN Shannon [name] is a 63 y.o. female with history of diabetes, hypertension, and dyslipidemia presenting with left sided visual complaints, left-sided numbness. She did not received TPA secondary to delay in presentation..   Stroke, right posterior cerebral artery distribution. The patient has evidence of diffuse intracranial atherosclerosis.  Resultant  right posterior temporal, occipital, and lateral thalamic  stroke   MRI  IMPRESSION: Acute infarction in the right PCA territory affecting the posterior medial temporal lobe, occipital lobe, hippocampus and to a minimal extent the right thalamus. No mass effect or hemorrhage. MR angiography shows occlusion or markedly diminished flow in the right posterior cerebral artery. Additionally, there is narrowing and irregularity in the proximal anterior and middle cerebral arteries, placing the patient at risk of anterior circulation infarction in the future.  Despite this extensive vascular disease, this acute right posterior cerebral artery territory infarction appears to be the  patient's  only previous cerebrovascular event.   MRA  Both internal carotid arteries are patent into the brain. There is  mild irregularity in the carotid siphon regions. There is narrowing  and irregularity of the supra clinoid internal carotid arteries,  proximal anterior and middle cerebral arteries. The A1 segment on  the left is hypoplastic or severely disease.  Both vertebral arteries are patent to the basilar. No basilar  stenosis. Posterior inferior cerebellar arteries show flow  bilaterally. Superior cerebellar arteries show flow bilaterally. The  left PCA is widely patent. The right PCA shows diminished distal  perfusion beyond 1.5 cm.    Carotid Doppler  pending  2D Echo  pending  LDL 111  HgbA1c pending  Subcutaneous heparin for VTE prophylaxis  Diet Carb Modified thin liquids  On Plavix prior to admission  Patient counseled to be compliant with her antithrombotic medications  Ongoing aggressive stroke risk factor management  Therapy recommendations:  Pending  Disposition:  Plan for discharge home  Hypertension  Home meds:   Metoprolol, Norvasc  Patient counseled to be compliant with her blood pressure medications  Hyperlipidemia  Home meds:  Pravachol, 20 mg resumed in hospital  LDL 111, goal < 70  Increased to 40 mg daily of Pravachol upon discharge  Continue statin at discharge  Diabetes  HgbA1c pending goal < 7.0  Other Stroke Risk Factors  Advanced age   Body mass index is 28.01 kg/(m^2).   Coronary artery disease  Obstructive sleep apnea, on CPAP at home  History of TIA  Other Active Problems  Fibromyalgia  Depression  Irritable bowel syndrome  Other Pertinent History  Anxiety disorder  Hospital day # 1  The patient presented with sensory symptoms on the left side, left sided visual complaints. The patient is left mainly with a left superior quadrantanopsia associated with a right posterior cerebral artery stroke  affecting the mesial temporal region on the right. The patient has diffuse atherosclerosis intracranially, this may be the source of the stroke. Carotid Doppler studies and echocardiogram are pending. The patient was on Plavix prior to coming in the hospital. The patient reports a prior history of TIA events. Currently, the patient has no significant coordination or motor deficits. She likely will not require extensive therapy.   Lesly Dukes [phone] To contact Stroke Continuity provider, please refer to [email or web address]. After hours, contact General Neurology

## 2014-10-24 NOTE — Progress Notes (Signed)
Patient arrived to 4N10 from ED. Patient assessed and oriented to the room and unit. Vital signs taken and BP still up but will give ordered medicine and recheck BP. Telemetry placed. Call light within reach. Patient in a pleasant mood, will continue to monitor closely. Monia PouchShakenna Yatzil Clippinger, RN

## 2014-10-24 NOTE — Progress Notes (Addendum)
Patient blood pressure 182/112. On call hospitalist paged for further orders. Monia PouchShakenna Kahner Yanik, RN  New orders given.

## 2014-10-24 NOTE — Progress Notes (Signed)
Called Schorr MD  about BG 244 and  She stated she would pass it along to the day shift that Ms. Dyk needs to be changed to resistant scale instead of moderate scale. Suzy Bouchardhompson, Ithan Touhey E, RN 10/24/2014 2233.

## 2014-10-24 NOTE — Progress Notes (Signed)
VASCULAR LAB PRELIMINARY  PRELIMINARY  PRELIMINARY  PRELIMINARY  Carotid duplex completed.    Preliminary report: No ICA stenosis bilaterally   Redmond Pullingltzroth, Damarious Holtsclaw F, RVT 10/24/2014, 4:00 PM

## 2014-10-25 ENCOUNTER — Inpatient Hospital Stay (HOSPITAL_COMMUNITY): Payer: Medicare Other

## 2014-10-25 DIAGNOSIS — F411 Generalized anxiety disorder: Secondary | ICD-10-CM

## 2014-10-25 LAB — GLUCOSE, CAPILLARY
GLUCOSE-CAPILLARY: 187 mg/dL — AB (ref 70–99)
Glucose-Capillary: 220 mg/dL — ABNORMAL HIGH (ref 70–99)
Glucose-Capillary: 137 mg/dL — ABNORMAL HIGH (ref 70–99)
Glucose-Capillary: 159 mg/dL — ABNORMAL HIGH (ref 70–99)

## 2014-10-25 LAB — LIPID PANEL
Cholesterol: 178 mg/dL (ref 0–200)
HDL: 45 mg/dL (ref >39)
LDL CALC: 90 mg/dL (ref 0–99)
Total CHOL/HDL Ratio: 4 RATIO
Triglycerides: 215 mg/dL — ABNORMAL HIGH (ref <150)
VLDL: 43 mg/dL — ABNORMAL HIGH (ref 0–40)

## 2014-10-25 MED ORDER — ALPRAZOLAM 0.5 MG PO TABS
0.5000 mg | ORAL_TABLET | Freq: Two times a day (BID) | ORAL | Status: DC | PRN
  Administered 2014-10-25: 0.5 mg via ORAL
  Filled 2014-10-25: qty 1

## 2014-10-25 MED ORDER — ESCITALOPRAM OXALATE 10 MG PO TABS
10.0000 mg | ORAL_TABLET | Freq: Every day | ORAL | Status: DC
  Administered 2014-10-25 – 2014-10-26 (×2): 10 mg via ORAL
  Filled 2014-10-25 (×2): qty 1

## 2014-10-25 NOTE — Progress Notes (Signed)
Echo Lab  2D Echocardiogram completed.  Christy Ehrsam L Tristan Bramble, RDCS 10/25/2014 2:49 PM

## 2014-10-25 NOTE — Progress Notes (Signed)
Occupational Therapy Evaluation Patient Details Name: Shannon [name] MRN: [id number] DOB: 04/24/1951 Today's Date: 10/25/2014    History of Present Illness 63 yo female sudden onset of vision changes 10/21/14, tingling in L face arm and leg. CT(+) R temporal lobe MRI (+) R PCA posterior medial temporal lobe, occipital lobe hippocampus and R thalamus PMH: Dm, HTN, CAD, s/p carotid stenting, anxiety, depression, fibromyalgia,    Clinical Impression   Pt admitted with the above diagnosis. Pt currently with functional limitiations due to the deficits listed below (see OT problem list). Pt with severe HA (8/10 pain) on OT arrival. Pt agreeable to participate in therapy despite HA pain. Pt completed ADLs and mobility with supervision. Unable to formally assess pt reported visual deficits due to HA pain. Decreased R grip strength noted, but fine motor coordination is intact. Gross motor strength is also relatively intact (overall 4/5). Pt will benefit from skilled OT to increase their independence and safety with adls and balance. Recommending HHOT services to assess safety in home environment.     Follow Up Recommendations  Home health OT;Supervision/Assistance - 24 hour    Equipment Recommendations  3 in 1 bedside comode;Tub/shower seat;Other (comment) (Rolling walker with 5" wheels)    Recommendations for Other Services Other (comment) (Social Work--referral for Family counseling)     Precautions / Restrictions Precautions Precautions: Fall Restrictions Weight Bearing Restrictions: No      Mobility Bed Mobility Overal bed mobility: Modified Independent             General bed mobility comments: On exiting bed on R side, pt impulsively got out of bed before being cued--HOB was elevated and pt used bedrails. On entering bed, HOB flat and no use of bedrails.  Transfers Overall transfer level: Needs assistance Equipment used: Rolling walker (2 wheeled) Transfers: Sit to/from  Stand Sit to Stand: Supervision         General transfer comment: Supervision for safety. Verbal cues for hand placement on surfaces (RW, bed, BSC).    Balance Overall balance assessment: Needs assistance Sitting-balance support: No upper extremity supported;Feet supported Sitting balance-Leahy Scale: Good     Standing balance support: Bilateral upper extremity supported;During functional activity Standing balance-Leahy Scale: Fair Standing balance comment: Able to maintain balance during sink level ADLs (dynamic and static balance tasks) without UE support. Requires bil UE support while walking.                            ADL Overall ADL's : Needs assistance/impaired     Grooming: Oral care;Wash/dry face;Wash/dry hands;Supervision/safety;Standing                   StatisticianToilet Transfer: Supervision/safety;Ambulation;BSC;RW   Toileting- Clothing Manipulation and Hygiene: Supervision/safety;Sit to/from stand       Functional mobility during ADLs: Supervision/safety;Rolling walker General ADL Comments: Verbal cues throughout session for safe hand placement on surfaces and proper RW use. Supervision required during ADLs and mobility for safety.     Vision                 Additional Comments: Did not formally assess vision due to pt with8/10 HA.   Perception     Praxis      Pertinent Vitals/Pain Pain Assessment: 0-10 Pain Score: 8  Pain Location: Headache Pain Descriptors / Indicators: Headache Pain Intervention(s): Limited activity within patient's tolerance;Monitored during session;Premedicated before session;Repositioned;Ice applied     Hand Dominance Right  Extremity/Trunk Assessment Upper Extremity Assessment Upper Extremity Assessment: LUE deficits/detail LUE Deficits / Details: Weak grip, elbow flexion 4/5, elbow extension 4/5, shoulder flexion 4/5 LUE Sensation: history of peripheral neuropathy   Lower Extremity Assessment Lower  Extremity Assessment: Defer to PT evaluation;LLE deficits/detail LLE Deficits / Details: L foot numb LLE Sensation: history of peripheral neuropathy;decreased light touch   Cervical / Trunk Assessment Cervical / Trunk Assessment: Normal   Communication Communication Communication: No difficulties   Cognition Arousal/Alertness: Lethargic;Suspect due to medications Behavior During Therapy: [facility] HospitalWFL for tasks assessed/performed Overall Cognitive Status: Within Functional Limits for tasks assessed                     General Comments       Exercises       Shoulder Instructions      Home Living Family/patient expects to be discharged to:: Private residence Living Arrangements: Non-relatives/Friends Available Help at Discharge: Family;Friend(s);Available PRN/intermittently Type of Home: House Home Access: Stairs to enter Entergy CorporationEntrance Stairs-Number of Steps: 4 Entrance Stairs-Rails: None Home Layout: One level     Bathroom Shower/Tub: Walk-in shower;Door   Foot LockerBathroom Toilet: Standard     Home Equipment: Environmental consultantWalker - 2 wheels;Grab bars - toilet;Grab bars - tub/shower;Hand held shower head          Prior Functioning/Environment Level of Independence: Independent        Comments: Not currently working, drives but car is unreliable. Pt reports having fibromyalgia and IBS which have made it difficult for her to maintain a job.    OT Diagnosis: Generalized weakness;Disturbance of vision;Acute pain   OT Problem List: Decreased strength;Decreased range of motion;Decreased activity tolerance;Impaired balance (sitting and/or standing);Impaired vision/perception;Decreased coordination;Decreased safety awareness;Decreased knowledge of use of DME or AE;Decreased knowledge of precautions;Impaired sensation;Pain   OT Treatment/Interventions: Therapeutic exercise;Self-care/ADL training;DME and/or AE instruction;Therapeutic activities;Visual/perceptual remediation/compensation;Patient/family  education;Balance training    OT Goals(Current goals can be found in the care plan section) Acute Rehab OT Goals Patient Stated Goal: for headache to go away OT Goal Formulation: With patient Time For Goal Achievement: 11/08/14 Potential to Achieve Goals: Good ADL Goals Pt Will Perform Upper Body Dressing: with modified independence;sitting Pt Will Perform Lower Body Dressing: with modified independence;sit to/from stand Pt Will Perform Tub/Shower Transfer: Shower transfer;with modified independence;ambulating;shower seat;grab bars;rolling walker Additional ADL Goal #1: Pt will verbalize 2 fall risk prevention strategies for home environment prior to discharge.  OT Frequency: Min 3X/week   Barriers to D/C:            Co-evaluation              End of Session Equipment Utilized During Treatment: Gait belt;Rolling walker Nurse Communication: Mobility status;Precautions  Activity Tolerance: Patient limited by pain Patient left: in bed;with call bell/phone within reach;with bed alarm set   Time: [id number] OT Time Calculation (min): 23 min Charges:    G-Codes:    Nils PyleBermel, Akiel Fennell 10/25/2014, 12:57 PM

## 2014-10-25 NOTE — Progress Notes (Addendum)
Inpatient Diabetes Program Recommendations  AACE/ADA: New Consensus Statement on Inpatient Glycemic Control (2013)  Target Ranges:  Prepandial:   less than 140 mg/dL      Peak postprandial:   less than 180 mg/dL (1-2 hours)      Critically ill patients:  140 - 180 mg/dL   Reason for Assessment:  Results for Shannon [name], Shannon [name] (MRN [id number]) as of 10/25/2014 11:55  Ref. Range 10/24/2014 14:19 10/24/2014 16:16 10/24/2014 21:26 10/25/2014 06:54 10/25/2014 11:40  Glucose-Capillary Latest Range: 70-99 mg/dL 409310 (H) 811210 (H) 914244 (H) 187 (H) 220 (H)   Diabetes history:  Type 2 diabetes Outpatient Diabetes medications: Amaryl 2 mg bid Current orders for Inpatient glycemic control:  Novolog moderate tid with meals, Amaryl 2 mg bid  Please consider adding Lantus 14 units daily while patient is in the hospital.  Spoke with patient briefly.  She states that she needs a new "Dr"and is interested in a new PCP.  She states that she does not feel well at this time.  She is interested in outpatient diabetes education.  Will place referral per protocol.  Thanks,  Beryl MeagerJenny Sherronda Sweigert, RN, BC-ADM Inpatient Diabetes Coordinator Pager [phone]

## 2014-10-25 NOTE — Evaluation (Addendum)
Physical Therapy Evaluation Patient Details Name: Shannon [name] MRN: [id number] DOB: 03/06/1951 Today's Date: 10/25/2014   History of Present Illness  63 yo female sudden onset of vision changes 10/21/14, tingling in L face arm and leg. CT(+) R temporal lobe MRI (+) R PCA posterior medial temporal lobe, occipital lobe hippocampus and R thalamus PMH: Dm, HTN, CAD, s/p carotid stenting, anxiety, depression, fibromyalgia,     Clinical Impression  Patient presents with functional limitations due to deficits listed in PT problem list (see below). Pt with new visual deficits and balance impairments impacting safe mobility. Required use of RW for safety. Pt upset and tearful during session today due to issues with family. Pt would benefit from skilled PT to improve transfers, gait, balance and mobility so pt can maximize independence and return to PLOF.    Follow Up Recommendations Supervision/Assistance - 24 hour;Home health PT    Equipment Recommendations  None recommended by PT    Recommendations for Other Services       Precautions / Restrictions Precautions Precautions: Fall Restrictions Weight Bearing Restrictions: No      Mobility  Bed Mobility Overal bed mobility: Modified Independent             General bed mobility comments: Received sitting in chair upon PT arrival.   Transfers Overall transfer level: Needs assistance Equipment used: Rolling walker (2 wheeled) Transfers: Sit to/from Stand Sit to Stand: Supervision         General transfer comment: Supervision for safety. Verbal cues for hand placement.  Ambulation/Gait Ambulation/Gait assistance: Min guard Ambulation Distance (Feet): 100 Feet Assistive device: Rolling walker (2 wheeled) Gait Pattern/deviations: Step-through pattern;Decreased stride length;Trunk flexed Gait velocity: slow   General Gait Details: Pt with slow, mildly unsteady gait. Able to read room numbers with increased time and  stopping to look head on. No LOB. Self limited distance due to feeling "tired" in her legs.  Stairs            Wheelchair Mobility    Modified Rankin (Stroke Patients Only) Modified Rankin (Stroke Patients Only) Pre-Morbid Rankin Score: No symptoms Modified Rankin: Moderately severe disability     Balance Overall balance assessment: Needs assistance Sitting-balance support: Feet supported;No upper extremity supported Sitting balance-Leahy Scale: Good     Standing balance support: During functional activity Standing balance-Leahy Scale: Fair Standing balance comment: Tolerates static standing without UE support, requires BUE on RW for balance/safety while walking (per pt choice).                             Pertinent Vitals/Pain Pain Assessment: 0-10 Pain Score:  (not rated on pain scale.) Pain Location: headache Pain Descriptors / Indicators: Headache Pain Intervention(s): Limited activity within patient's tolerance;Monitored during session;Repositioned    Home Living Family/patient expects to be discharged to:: Private residence Living Arrangements: Non-relatives/Friends Available Help at Discharge: Family;Friend(s);Available PRN/intermittently Type of Home: House Home Access: Stairs to enter Entrance Stairs-Rails: None Entrance Stairs-Number of Steps: 4 Home Layout: One level Home Equipment: Walker - 2 wheels;Grab bars - toilet;Grab bars - tub/shower;Hand held shower head      Prior Function Level of Independence: Independent         Comments: Not currently working, drives but car is unreliable. Pt reports having fibromyalgia and IBS which have made it difficult for her to maintain a job. Uses RW PRN - but not in awhile.     Hand Dominance   Dominant  Hand: Right    Extremity/Trunk Assessment   Upper Extremity Assessment: Defer to OT evaluation       LUE Deficits / Details: Weak grip, elbow flexion 4/5, elbow extension 4/5, shoulder  flexion 4/5   Lower Extremity Assessment: Generalized weakness (Except premorbid left foot numbness.)   LLE Deficits / Details: L foot numb  Cervical / Trunk Assessment: Normal  Communication   Communication: No difficulties  Cognition Arousal/Alertness: Lethargic ("I am just so tired.") Behavior During Therapy: WFL for tasks assessed/performed Overall Cognitive Status: Within Functional Limits for tasks assessed                      General Comments General comments (skin integrity, edema, etc.): Pt very upset and tearful during session today due to having a rough conversation with family earlier in the day. Pt able to visually track in all directions and quadrants. Seems to have difficulty seeing objects/fingers in left visual field and LLQ. Reports having difficulty focusing on numbers in cell phone. Able to reach room numbers but needed to stop and focus - increased time to read signs. Pain in eyes and head with testing.   Exercises        Assessment/Plan    PT Assessment Patient needs continued PT services  PT Diagnosis Generalized weakness;Acute pain   PT Problem List Decreased strength;Pain;Impaired sensation;Decreased activity tolerance;Decreased balance;Decreased mobility  PT Treatment Interventions Balance training;Gait training;Stair training;Patient/family education;Functional mobility training;Therapeutic activities;Therapeutic exercise;Neuromuscular re-education   PT Goals (Current goals can be found in the Care Plan section) Acute Rehab PT Goals Patient Stated Goal: to feel better PT Goal Formulation: With patient Time For Goal Achievement: 11/08/14 Potential to Achieve Goals: Good    Frequency Min 3X/week   Barriers to discharge        Co-evaluation               End of Session Equipment Utilized During Treatment: Gait belt Activity Tolerance: Patient tolerated treatment well Patient left: in chair;with call bell/phone within reach;with  chair alarm set Nurse Communication: Mobility status         Time: 1324-1340 PT Time Calculation (min) (ACUTE ONLY): 16 min   Charges:   PT Evaluation $Initial PT Evaluation Tier I: 1 Procedure PT Treatments $Gait Training: 8-22 mins   PT G CodesAlvie Heidelberg:          Folan, Haja Crego A 10/25/2014, 1:51 PM Alvie HeidelbergShauna Folan, PT, DPT [phone]

## 2014-10-25 NOTE — Progress Notes (Addendum)
STROKE TEAM PROGRESS NOTE   HISTORY Cardell Peachsther Bonita Fruchter is an 63 y.o. female with a past medical history that is relevant for HTN, DM, CAD, s/p carotid stenting, depression, anxiety, fibromyalgia, presents for further evaluation of the above stated symptoms. She indicated that she developed sudden onset of visual changes that she describes as " seeing bright lights moving around" which lasted less than a hour. Then, the next day she noticed tingling and numbness of her left face-lips-arm and legs that became more prominent today. Denies associated HA, vertigo, double vision, difficulty swallowing, focal weakness, unsteadiness, slurred speech, confusion, or language impairment. CT brain tonight revealed an area of low attenuation in the medial aspect of the right temporal lobe suspicious for potentialage-indeterminate ischemia.  Takes aspirin daily. Date last known well: uncertain Time last known well: uncertain tPA Given: no, out of the window.    SUBJECTIVE (INTERVAL HISTORY) No family is at bedside. She is complaining of a headache from the back of her head to the front that is dull and intermittent.  OBJECTIVE Temp:  [97.7 F (36.5 C)-98.2 F (36.8 C)] 97.8 F (36.6 C) (12/17 1325) Pulse Rate:  [67-83] 75 (12/17 1328) Cardiac Rhythm:  [-] Normal sinus rhythm (12/17 0810) Resp:  [18-20] 20 (12/17 1325) BP: (120-200)/(67-97) 192/90 mmHg (12/17 1328) SpO2:  [96 %-99 %] 99 % (12/17 1325)   Recent Labs Lab 10/24/14 1419 10/24/14 1616 10/24/14 2126 10/25/14 0654 10/25/14 1140  GLUCAP 310* 210* 244* 187* 220*    Recent Labs Lab 10/23/14 1908 10/24/14 0059 10/24/14 0735  NA 140 141  --   K 4.3 4.4  --   CL 100 100  --   CO2 26 25  --   GLUCOSE 138* 105*  --   BUN 14 13  --   CREATININE 0.79 0.75 0.72  CALCIUM 10.3 10.0  --     Recent Labs Lab 10/24/14 0059  AST 24  ALT 17  ALKPHOS 77  BILITOT 0.3  PROT 7.1  ALBUMIN 4.1    Recent Labs Lab 10/23/14 1908  10/24/14 0735  WBC 9.6 7.9  HGB 14.2 14.3  HCT 42.4 43.1  MCV 84.3 86.4  PLT 200 172   No results for input(s): CKTOTAL, CKMB, CKMBINDEX, TROPONINI in the last 168 hours.  Recent Labs  10/23/14 1908  LABPROT 14.4  INR 1.10   No results for input(s): COLORURINE, LABSPEC, PHURINE, GLUCOSEU, HGBUR, BILIRUBINUR, KETONESUR, PROTEINUR, UROBILINOGEN, NITRITE, LEUKOCYTESUR in the last 72 hours.  Invalid input(s): APPERANCEUR     Component Value Date/Time   CHOL 178 10/25/2014 0439   TRIG 215* 10/25/2014 0439   HDL 45 10/25/2014 0439   CHOLHDL 4.0 10/25/2014 0439   VLDL 43* 10/25/2014 0439   LDLCALC 90 10/25/2014 0439   Lab Results  Component Value Date   HGBA1C 7.9* 10/24/2014      Component Value Date/Time   LABOPIA NONE DETECTED 10/24/2014 0154   COCAINSCRNUR NONE DETECTED 10/24/2014 0154   LABBENZ NONE DETECTED 10/24/2014 0154   AMPHETMU NONE DETECTED 10/24/2014 0154   THCU NONE DETECTED 10/24/2014 0154   LABBARB NONE DETECTED 10/24/2014 0154    No results for input(s): ETH in the last 168 hours.  Dg Chest 2 View  10/23/2014   CLINICAL DATA:  Difficulty breathing  EXAM: CHEST  2 VIEW  COMPARISON:  July 01, 2013  FINDINGS: There is underlying emphysematous change. There is no edema or consolidation. The heart size and pulmonary vascularity are within normal limits. No  adenopathy. There is degenerative change in the thoracic spine.  IMPRESSION: Underlying emphysematous change.  No edema or consolidation.   Electronically Signed   By: Bretta Bang M.D.   On: 10/23/2014 21:05   Ct Head (brain) Wo Contrast  10/23/2014   CLINICAL DATA:  63 year old female with history of dizziness, shortness of breath, numbness and tingling to the face and the entire left side of the body. Visual disturbance.  EXAM: CT HEAD WITHOUT CONTRAST  TECHNIQUE: Contiguous axial images were obtained from the base of the skull through the vertex without intravenous contrast.  COMPARISON:  Head CT  12/02/2012.  FINDINGS: In the medial aspect of the right temporal lobe best demonstrated on images 13 and 14 of series 2 there is a area of low attenuation, which could represent age-indeterminate ischemia. No other acute intracranial abnormalities. Specifically, no evidence of acute intracranial hemorrhage, no mass, mass effect, hydrocephalus or abnormal intra or extra-axial fluid collections. Visualized paranasal sinuses and mastoids are well pneumatized. No acute displaced skull fractures are identified.  IMPRESSION: 1. Unusual area of low attenuation in the medial aspect of the right temporal lobe. Although this could conceivably represent some beam hardening artifact from the skull base, this is typically seen at lower levels, and findings are suspicious for potential age-indeterminate ischemia. This could be better evaluated with followup MRI of the brain with and without IV gadolinium. These results were called by telephone at the time of interpretation on 10/23/2014 at 8:30 pm to Dr. Elesa Massed, who verbally acknowledged these results.   Electronically Signed   By: Trudie Reed M.D.   On: 10/23/2014 20:33   Mr [name] Head Wo Contrast  10/24/2014   CLINICAL DATA:  Numbness and tingling of the face and left side of the body. Symptoms developed yesterday.  EXAM: MRI HEAD WITHOUT CONTRAST  MRA HEAD WITHOUT CONTRAST  TECHNIQUE: Multiplanar, multiecho pulse sequences of the brain and surrounding structures were obtained without intravenous contrast. Angiographic images of the head were obtained using MRA technique without contrast.  COMPARISON:  Head CT 10/23/2014  FINDINGS: MRI HEAD FINDINGS  Diffusion imaging shows clustered foci of acute infarction affecting the right posterior medial temporal lobe, occipital lobe, hippocampus, and minimal punctate involvement of the lateral thalamus, consistent with infarction in the right posterior cerebral artery territory. No other acute infarction.  The cerebellum is  unremarkable. The left cerebral hemisphere is normal. There is no mass lesion, hemorrhage, hydrocephalus or extra-axial collection. No pituitary mass. No inflammatory sinus disease. No skull or skullbase lesion.  MRA HEAD FINDINGS  Both internal carotid arteries are patent into the brain. There is mild irregularity in the carotid siphon regions. There is narrowing and irregularity of the supra clinoid internal carotid arteries, proximal anterior and middle cerebral arteries. The A1 segment on the left is hypoplastic or severely disease.  Both vertebral arteries are patent to the basilar. No basilar stenosis. Posterior inferior cerebellar arteries show flow bilaterally. Superior cerebellar arteries show flow bilaterally. The left PCA is widely patent. The right PCA shows diminished distal perfusion beyond 1.5 cm.  IMPRESSION: Acute infarction in the right PCA territory affecting the posterior medial temporal lobe, occipital lobe, hippocampus and to a minimal extent the right thalamus. No mass effect or hemorrhage. MR angiography shows occlusion or markedly diminished flow in the right posterior cerebral artery. Additionally, there is narrowing and irregularity in the proximal anterior and middle cerebral arteries, placing the patient at risk of anterior circulation infarction in the future.  Despite  this extensive vascular disease, this acute right posterior cerebral artery territory infarction appears to be the patient's only previous cerebrovascular event.   Electronically Signed   By: Paulina FusiMark  Shogry M.D.   On: 10/24/2014 11:58   Mr Brain Wo Contrast  10/24/2014   CLINICAL DATA:  Numbness and tingling of the face and left side of the body. Symptoms developed yesterday.  EXAM: MRI HEAD WITHOUT CONTRAST  MRA HEAD WITHOUT CONTRAST  TECHNIQUE: Multiplanar, multiecho pulse sequences of the brain and surrounding structures were obtained without intravenous contrast. Angiographic images of the head were obtained using  MRA technique without contrast.  COMPARISON:  Head CT 10/23/2014  FINDINGS: MRI HEAD FINDINGS  Diffusion imaging shows clustered foci of acute infarction affecting the right posterior medial temporal lobe, occipital lobe, hippocampus, and minimal punctate involvement of the lateral thalamus, consistent with infarction in the right posterior cerebral artery territory. No other acute infarction.  The cerebellum is unremarkable. The left cerebral hemisphere is normal. There is no mass lesion, hemorrhage, hydrocephalus or extra-axial collection. No pituitary mass. No inflammatory sinus disease. No skull or skullbase lesion.  MRA HEAD FINDINGS  Both internal carotid arteries are patent into the brain. There is mild irregularity in the carotid siphon regions. There is narrowing and irregularity of the supra clinoid internal carotid arteries, proximal anterior and middle cerebral arteries. The A1 segment on the left is hypoplastic or severely disease.  Both vertebral arteries are patent to the basilar. No basilar stenosis. Posterior inferior cerebellar arteries show flow bilaterally. Superior cerebellar arteries show flow bilaterally. The left PCA is widely patent. The right PCA shows diminished distal perfusion beyond 1.5 cm.  IMPRESSION: Acute infarction in the right PCA territory affecting the posterior medial temporal lobe, occipital lobe, hippocampus and to a minimal extent the right thalamus. No mass effect or hemorrhage. MR angiography shows occlusion or markedly diminished flow in the right posterior cerebral artery. Additionally, there is narrowing and irregularity in the proximal anterior and middle cerebral arteries, placing the patient at risk of anterior circulation infarction in the future.  Despite this extensive vascular disease, this acute right posterior cerebral artery territory infarction appears to be the patient's only previous cerebrovascular event.   Electronically Signed   By: Paulina FusiMark  Shogry M.D.    On: 10/24/2014 11:58     Physical Exam  General: The patient is alert and cooperative at the time of the examination.  Skin: No significant peripheral edema is noted.  Neurologic Exam  Mental status: The patient is oriented x 3.  Cranial nerves: Facial symmetry is present. Speech is normal, no aphasia or dysarthria is noted. Extraocular movements are full. Visual fields are full with exception of a left homonymous visual field deficit  Motor: The patient has good strength in all 4 extremities.  Sensory examination: Soft touch sensation is symmetric on the face, arms, and legs.  Coordination: The patient has good finger-nose-finger and heel-to-shin bilaterally.  Gait and station: The gait was not tested.  Reflexes: Deep tendon reflexes are symmetric.    ASSESSMENT/PLAN Ms. Cardell Peachsther Bonita Witters is a 63 y.o. female with history of diabetes, hypertension, and dyslipidemia presenting with left sided visual complaints, left-sided numbness. She did not received TPA secondary to delay in presentation..   Stroke, right posterior cerebral artery distribution. The patient has evidence of diffuse intracranial atherosclerosis.  Resultant  right posterior temporal, occipital, and lateral thalamic stroke   MRI  IMPRESSION: Acute infarction in the right PCA territory affecting the posterior medial  temporal lobe, occipital lobe, hippocampus and to a minimal extent the right thalamus. No mass effect or hemorrhage. MR angiography shows occlusion or markedly diminished flow in the right posterior cerebral artery. Additionally, there is narrowing and irregularity in the proximal anterior and middle cerebral arteries, placing the patient at risk of anterior circulation infarction in the future.  Despite this extensive vascular disease, this acute right posterior cerebral artery territory infarction appears to be the patient's  only previous cerebrovascular event.   MRA  Both internal  carotid arteries are patent into the brain. There is  mild irregularity in the carotid siphon regions. There is narrowing  and irregularity of the supra clinoid internal carotid arteries,  proximal anterior and middle cerebral arteries. The A1 segment on  the left is hypoplastic or severely disease.  Both vertebral arteries are patent to the basilar. No basilar  stenosis. Posterior inferior cerebellar arteries show flow  bilaterally. Superior cerebellar arteries show flow bilaterally. The  left PCA is widely patent. The right PCA shows diminished distal  perfusion beyond 1.5 cm.    Carotid Doppler: No acute findings.  2D Echo  pending  LDL 111  HgbA1c: 7.9  Subcutaneous heparin for VTE prophylaxis  Diet Carb Modified thin liquids  On Plavix prior to admission  Patient counseled to be compliant with her antithrombotic medications  Ongoing aggressive stroke risk factor management  Therapy recommendations:  Pending  Disposition:  Plan for discharge home  Hypertension  Home meds:   Metoprolol, Norvasc  Patient counseled to be compliant with her blood pressure medications  Hyperlipidemia  Home meds:  Pravachol, 20 mg resumed in hospital  LDL 111, goal < 70  Increased to 40 mg daily of Pravachol upon discharge  Continue statin at discharge  Diabetes  HgbA1c 7.9  Will need better control of her blood sugars  Other Stroke Risk Factors  Advanced age   Body mass index is 28.01 kg/(m^2).   Coronary artery disease  Obstructive sleep apnea, on CPAP at home  History of TIA  Other Active Problems  Fibromyalgia  Depression  Irritable bowel syndrome  Other Pertinent History  Anxiety disorder  Hospital day # 2  The patient presented with sensory symptoms on the left side, left sided visual complaints. The patient is left mainly with a left homonymous visual field deficit. The patient has diffuse atherosclerosis intracranially, this may be the source of  the stroke. Carotid Doppler studies and echocardiogram are pending. The patient was on Plavix prior to coming in the hospital. The patient reports a prior history of TIA events. Currently, the patient has no significant coordination or motor deficits. She likely will not require extensive therapy.  Clinical examination today does show some progression of the visual field deficit, the patient now has a dense left homonymous visual field deficit rather than a superior quadrantanopsia. The patient may be extended to stroke slightly, I will repeat a CT scan of the head today to exclude possible hemorrhagic transformation.  2-D echocardiogram is still pending.   Lesly Dukes [phone] Lesly Dukes   To contact Stroke Continuity provider, please refer to [email or web address]. After hours, contact General Neurology

## 2014-10-25 NOTE — Progress Notes (Signed)
[facility] PROGRESS NOTE  Shannon [name] YTK:[id number] DOB: [birth year] DOA: 10/23/2014  PCP: Holland Commons, NP  Brief HPI: 63 year old female presented with weakness and tingling along with numbness on the left side. She was admitted for further workup.  Past medical history:  Past Medical History  Diagnosis Date  . Hypertension   . Coronary artery disease   . Diabetes mellitus without complication   . Arthritis   . Fibromyalgia   . Irritable bowel   . Anxiety   . Depression   . Fibromyalgia 1995    Consultants: Neurology  Procedures:  Carotid Dopplers. No ICA stenosis bilaterally  2-D echocardiogram. Pending  Antibiotics: None  Subjective: Patient experienced decrease in her peripheral vision yesterday evening. At that time her blood pressure was 220. She was also very anxious. After she was given medications, her symptoms improved. She remains anxious. She also admits to being depressed due to her acute illness. And due to family dynamics.   Objective: Vital Signs  Filed Vitals:   10/24/14 1732 10/24/14 2128 10/25/14 0127 10/25/14 0636  BP: 200/88 172/97 155/79 164/75  Pulse: 72 83 81 70  Temp: 98.1 F (36.7 C) 97.8 F (36.6 C) 98.1 F (36.7 C) 97.7 F (36.5 C)  TempSrc: Oral Oral Oral Oral  Resp: 20 18 20 18   Height:      Weight:      SpO2:  99% 98% 99%    Intake/Output Summary (Last 24 hours) at 10/25/14 5573 Last data filed at 10/25/14 2202  Gross per 24 hour  Intake    718 ml  Output   1350 ml  Net   -632 ml   Filed Weights   10/24/14 0130  Weight: 71.7 kg (158 lb 1.1 oz)    General appearance: alert, cooperative, appears stated age and no distress Resp: clear to auscultation bilaterally Cardio: regular rate and rhythm, S1, S2 normal, no murmur, click, rub or gallop GI: soft, non-tender; bowel sounds normal; no masses,  no organomegaly Extremities: extremities normal, atraumatic, no cyanosis or edema Neurologic: Alert and  oriented 3. Sensory deficits appreciated on the left. No motor strength deficits appreciated. No facial asymmetry. No new deficits appreciated.  Lab Results:  Basic Metabolic Panel:  Recent Labs Lab 10/23/14 1908 10/24/14 0059 10/24/14 0735  NA 140 141  --   K 4.3 4.4  --   CL 100 100  --   CO2 26 25  --   GLUCOSE 138* 105*  --   BUN 14 13  --   CREATININE 0.79 0.75 0.72  CALCIUM 10.3 10.0  --    Liver Function Tests:  Recent Labs Lab 10/24/14 0059  AST 24  ALT 17  ALKPHOS 77  BILITOT 0.3  PROT 7.1  ALBUMIN 4.1   CBC:  Recent Labs Lab 10/23/14 1908 10/24/14 0735  WBC 9.6 7.9  HGB 14.2 14.3  HCT 42.4 43.1  MCV 84.3 86.4  PLT 200 172   CBG:  Recent Labs Lab 10/24/14 0658 10/24/14 1419 10/24/14 1616 10/24/14 2126 10/25/14 0654  GLUCAP 198* 310* 210* 244* 187*    Studies/Results: Dg Chest 2 View  10/23/2014   CLINICAL DATA:  Difficulty breathing  EXAM: CHEST  2 VIEW  COMPARISON:  July 01, 2013  FINDINGS: There is underlying emphysematous change. There is no edema or consolidation. The heart size and pulmonary vascularity are within normal limits. No adenopathy. There is degenerative change in the thoracic spine.  IMPRESSION: Underlying emphysematous change.  No edema or consolidation.   Electronically Signed   By: Bretta Bang M.D.   On: 10/23/2014 21:05   Ct Head (brain) Wo Contrast  10/23/2014   CLINICAL DATA:  63 year old female with history of dizziness, shortness of breath, numbness and tingling to the face and the entire left side of the body. Visual disturbance.  EXAM: CT HEAD WITHOUT CONTRAST  TECHNIQUE: Contiguous axial images were obtained from the base of the skull through the vertex without intravenous contrast.  COMPARISON:  Head CT 12/02/2012.  FINDINGS: In the medial aspect of the right temporal lobe best demonstrated on images 13 and 14 of series 2 there is a area of low attenuation, which could represent age-indeterminate ischemia.  No other acute intracranial abnormalities. Specifically, no evidence of acute intracranial hemorrhage, no mass, mass effect, hydrocephalus or abnormal intra or extra-axial fluid collections. Visualized paranasal sinuses and mastoids are well pneumatized. No acute displaced skull fractures are identified.  IMPRESSION: 1. Unusual area of low attenuation in the medial aspect of the right temporal lobe. Although this could conceivably represent some beam hardening artifact from the skull base, this is typically seen at lower levels, and findings are suspicious for potential age-indeterminate ischemia. This could be better evaluated with followup MRI of the brain with and without IV gadolinium. These results were called by telephone at the time of interpretation on 10/23/2014 at 8:30 pm to Dr. Elesa Massed, who verbally acknowledged these results.   Electronically Signed   By: Trudie Reed M.D.   On: 10/23/2014 20:33   Mr [name] Head Wo Contrast  10/24/2014   CLINICAL DATA:  Numbness and tingling of the face and left side of the body. Symptoms developed yesterday.  EXAM: MRI HEAD WITHOUT CONTRAST  MRA HEAD WITHOUT CONTRAST  TECHNIQUE: Multiplanar, multiecho pulse sequences of the brain and surrounding structures were obtained without intravenous contrast. Angiographic images of the head were obtained using MRA technique without contrast.  COMPARISON:  Head CT 10/23/2014  FINDINGS: MRI HEAD FINDINGS  Diffusion imaging shows clustered foci of acute infarction affecting the right posterior medial temporal lobe, occipital lobe, hippocampus, and minimal punctate involvement of the lateral thalamus, consistent with infarction in the right posterior cerebral artery territory. No other acute infarction.  The cerebellum is unremarkable. The left cerebral hemisphere is normal. There is no mass lesion, hemorrhage, hydrocephalus or extra-axial collection. No pituitary mass. No inflammatory sinus disease. No skull or skullbase lesion.   MRA HEAD FINDINGS  Both internal carotid arteries are patent into the brain. There is mild irregularity in the carotid siphon regions. There is narrowing and irregularity of the supra clinoid internal carotid arteries, proximal anterior and middle cerebral arteries. The A1 segment on the left is hypoplastic or severely disease.  Both vertebral arteries are patent to the basilar. No basilar stenosis. Posterior inferior cerebellar arteries show flow bilaterally. Superior cerebellar arteries show flow bilaterally. The left PCA is widely patent. The right PCA shows diminished distal perfusion beyond 1.5 cm.  IMPRESSION: Acute infarction in the right PCA territory affecting the posterior medial temporal lobe, occipital lobe, hippocampus and to a minimal extent the right thalamus. No mass effect or hemorrhage. MR angiography shows occlusion or markedly diminished flow in the right posterior cerebral artery. Additionally, there is narrowing and irregularity in the proximal anterior and middle cerebral arteries, placing the patient at risk of anterior circulation infarction in the future.  Despite this extensive vascular disease, this acute right posterior cerebral artery territory infarction appears to be  the patient's only previous cerebrovascular event.   Electronically Signed   By: Paulina FusiMark  Shogry M.D.   On: 10/24/2014 11:58   Mr Brain Wo Contrast  10/24/2014   CLINICAL DATA:  Numbness and tingling of the face and left side of the body. Symptoms developed yesterday.  EXAM: MRI HEAD WITHOUT CONTRAST  MRA HEAD WITHOUT CONTRAST  TECHNIQUE: Multiplanar, multiecho pulse sequences of the brain and surrounding structures were obtained without intravenous contrast. Angiographic images of the head were obtained using MRA technique without contrast.  COMPARISON:  Head CT 10/23/2014  FINDINGS: MRI HEAD FINDINGS  Diffusion imaging shows clustered foci of acute infarction affecting the right posterior medial temporal lobe,  occipital lobe, hippocampus, and minimal punctate involvement of the lateral thalamus, consistent with infarction in the right posterior cerebral artery territory. No other acute infarction.  The cerebellum is unremarkable. The left cerebral hemisphere is normal. There is no mass lesion, hemorrhage, hydrocephalus or extra-axial collection. No pituitary mass. No inflammatory sinus disease. No skull or skullbase lesion.  MRA HEAD FINDINGS  Both internal carotid arteries are patent into the brain. There is mild irregularity in the carotid siphon regions. There is narrowing and irregularity of the supra clinoid internal carotid arteries, proximal anterior and middle cerebral arteries. The A1 segment on the left is hypoplastic or severely disease.  Both vertebral arteries are patent to the basilar. No basilar stenosis. Posterior inferior cerebellar arteries show flow bilaterally. Superior cerebellar arteries show flow bilaterally. The left PCA is widely patent. The right PCA shows diminished distal perfusion beyond 1.5 cm.  IMPRESSION: Acute infarction in the right PCA territory affecting the posterior medial temporal lobe, occipital lobe, hippocampus and to a minimal extent the right thalamus. No mass effect or hemorrhage. MR angiography shows occlusion or markedly diminished flow in the right posterior cerebral artery. Additionally, there is narrowing and irregularity in the proximal anterior and middle cerebral arteries, placing the patient at risk of anterior circulation infarction in the future.  Despite this extensive vascular disease, this acute right posterior cerebral artery territory infarction appears to be the patient's only previous cerebrovascular event.   Electronically Signed   By: Paulina FusiMark  Shogry M.D.   On: 10/24/2014 11:58    Medications:  Scheduled: . ALPRAZolam  0.5 mg Oral Once  . aspirin  325 mg Oral Daily  . clopidogrel  75 mg Oral Q breakfast  . escitalopram  10 mg Oral Daily  . glimepiride   2 mg Oral BID WC  . heparin  5,000 Units Subcutaneous 3 times per day  . insulin aspart  0-15 Units Subcutaneous TID WC  . levothyroxine  25 mcg Oral QAC breakfast  . nitroGLYCERIN  1 inch Topical 4 times per day  . pantoprazole  40 mg Oral Daily  . pravastatin  40 mg Oral q1800  . sodium chloride  3 mL Intravenous Q12H   Continuous:  ZOX:WRUEAVPRN:sodium chloride, acetaminophen, ALPRAZolam, gabapentin, hydrALAZINE, HYDROcodone-acetaminophen, hydrOXYzine, nitroGLYCERIN, sodium chloride, triamcinolone cream  Assessment/Plan:  Principal Problem:   Acute CVA (cerebrovascular accident) Active Problems:   Hypertension, essential   Fibromyalgia   DM type 2 (diabetes mellitus, type 2)   Accelerated hypertension   Acute stroke in the right PCA territory Stroke workup is in progress. Echocardiogram is pending. Carotid Doppler as above. Neurology to see. She was taking both aspirin and Plavix at home. These are being continued. PT and OT to evaluate. LDL is 111. She is already on a statin. The dose was increased.  Accelerated Hypertension Allowing permissive hypertension. Monitor blood pressures closely. Hydralazine as needed for significantly elevated BPs.  History of diabetes mellitus type 2. Continue sliding scale coverage. Continue oral agents.  History of hypothyroidism. Continue with levothyroxine.  History of fibromyalgia. Continue home medication regimen.  History of anxiety and possibly depression She tells me that she's had these symptoms for a long time. Anxiety is also present. I think Lexapro may be a good agent of choice for her. This will be initiated.  DVT Prophylaxis: Heparin    Code Status: Full code  Family Communication: Discussed with the patient  Disposition Plan: Not ready for discharge. PT and OT are pending.    LOS: 2 days   Garrett Eye CenterKRISHNAN,Nicholus Chandran  [facility] Pager [phone] 10/25/2014, 9:21 AM  If 8PM-8AM, please contact night-coverage at [email or web address],  password [email or web address]

## 2014-10-26 ENCOUNTER — Encounter (HOSPITAL_COMMUNITY): Payer: Self-pay | Admitting: Internal Medicine

## 2014-10-26 LAB — GLUCOSE, CAPILLARY
Glucose-Capillary: 155 mg/dL — ABNORMAL HIGH (ref 70–99)
Glucose-Capillary: 134 mg/dL — ABNORMAL HIGH (ref 70–99)
Glucose-Capillary: 193 mg/dL — ABNORMAL HIGH (ref 70–99)

## 2014-10-26 MED ORDER — AMLODIPINE BESYLATE 5 MG PO TABS
5.0000 mg | ORAL_TABLET | Freq: Every day | ORAL | Status: DC
  Administered 2014-10-26: 5 mg via ORAL
  Filled 2014-10-26: qty 1

## 2014-10-26 MED ORDER — ALPRAZOLAM 0.5 MG PO TABS: 0.5000 mg | ORAL_TABLET | Freq: Every day | ORAL | Status: DC | PRN

## 2014-10-26 MED ORDER — LOPERAMIDE HCL 2 MG PO CAPS: 4.0000 mg | ORAL_CAPSULE | Freq: Once | ORAL | Status: DC

## 2014-10-26 MED ORDER — ALUM & MAG HYDROXIDE-SIMETH 200-200-20 MG/5ML PO SUSP
30.0000 mL | Freq: Once | ORAL | Status: DC
  Filled 2014-10-26: qty 30

## 2014-10-26 MED ORDER — METOPROLOL TARTRATE 50 MG PO TABS
50.0000 mg | ORAL_TABLET | Freq: Two times a day (BID) | ORAL | Status: DC
  Administered 2014-10-26: 50 mg via ORAL
  Filled 2014-10-26: qty 1

## 2014-10-26 MED ORDER — PRAVASTATIN SODIUM 40 MG PO TABS: 40.0000 mg | ORAL_TABLET | Freq: Every day | ORAL | Status: DC

## 2014-10-26 MED ORDER — ESCITALOPRAM OXALATE 10 MG PO TABS: 10.0000 mg | ORAL_TABLET | Freq: Every day | ORAL | Status: DC

## 2014-10-26 NOTE — Progress Notes (Addendum)
STROKE TEAM PROGRESS NOTE   HISTORY  Kamari Bilek is an 63 y.o. female with a past medical history that is relevant for HTN, DM, CAD, s/p carotid stenting, depression, anxiety, fibromyalgia, presents for further evaluation of left-sided paresthesias. She indicated that she developed sudden onset of visual changes that she describes as " seeing bright lights moving around" which lasted less than a hour. Then, the next day she noticed tingling and numbness of her left face-lips-arm and legs that became more prominent today. Denies associated HA, vertigo, double vision, difficulty swallowing, focal weakness, unsteadiness, slurred speech, confusion, or language impairment. CT brain tonight revealed an area of low attenuation in the medial aspect of the right temporal lobe suspicious for potentialage-indeterminate ischemia.  Takes aspirin daily.  Date last known well: uncertain Time last known well: uncertain tPA Given: no, out of the window.    SUBJECTIVE (INTERVAL HISTORY) No family members present. The patient complains of a headache and feeling tired. She also states that she would like medication for anxiety. I told her I would pass this on to Dr. Rito Ehrlich.  OBJECTIVE Temp:  [97.8 F (36.6 C)-98.8 F (37.1 C)] 98.8 F (37.1 C) (12/18 0537) Pulse Rate:  [66-77] 66 (12/18 0537) Cardiac Rhythm:  [-] Normal sinus rhythm (12/17 2031) Resp:  [16-20] 16 (12/18 0537) BP: (145-192)/(61-90) 163/61 mmHg (12/18 0537) SpO2:  [96 %-99 %] 97 % (12/18 0537)   Recent Labs Lab 10/25/14 0654 10/25/14 1140 10/25/14 1646 10/25/14 2147 10/26/14 0805  GLUCAP 187* 220* 137* 159* 155*    Recent Labs Lab 10/23/14 1908 10/24/14 0059 10/24/14 0735  NA 140 141  --   K 4.3 4.4  --   CL 100 100  --   CO2 26 25  --   GLUCOSE 138* 105*  --   BUN 14 13  --   CREATININE 0.79 0.75 0.72  CALCIUM 10.3 10.0  --     Recent Labs Lab 10/24/14 0059  AST 24  ALT 17  ALKPHOS 77  BILITOT 0.3   PROT 7.1  ALBUMIN 4.1    Recent Labs Lab 10/23/14 1908 10/24/14 0735  WBC 9.6 7.9  HGB 14.2 14.3  HCT 42.4 43.1  MCV 84.3 86.4  PLT 200 172   No results for input(s): CKTOTAL, CKMB, CKMBINDEX, TROPONINI in the last 168 hours.  Recent Labs  10/23/14 1908  LABPROT 14.4  INR 1.10   No results for input(s): COLORURINE, LABSPEC, PHURINE, GLUCOSEU, HGBUR, BILIRUBINUR, KETONESUR, PROTEINUR, UROBILINOGEN, NITRITE, LEUKOCYTESUR in the last 72 hours.  Invalid input(s): APPERANCEUR     Component Value Date/Time   CHOL 178 10/25/2014 0439   TRIG 215* 10/25/2014 0439   HDL 45 10/25/2014 0439   CHOLHDL 4.0 10/25/2014 0439   VLDL 43* 10/25/2014 0439   LDLCALC 90 10/25/2014 0439   Lab Results  Component Value Date   HGBA1C 7.9* 10/24/2014      Component Value Date/Time   LABOPIA NONE DETECTED 10/24/2014 0154   COCAINSCRNUR NONE DETECTED 10/24/2014 0154   LABBENZ NONE DETECTED 10/24/2014 0154   AMPHETMU NONE DETECTED 10/24/2014 0154   THCU NONE DETECTED 10/24/2014 0154   LABBARB NONE DETECTED 10/24/2014 0154    No results for input(s): ETH in the last 168 hours.  Ct Head Wo Contrast 10/25/2014    Evolving subacute infarct in the RIGHT PCA distribution. No complicating features.      Mr Shirlee Latch Wo Contrast 10/24/2014    Acute infarction in the right PCA territory  affecting the posterior medial temporal lobe, occipital lobe, hippocampus and to a minimal extent the right thalamus. No mass effect or hemorrhage.   MR angiography   Occlusion or markedly diminished flow in the right posterior cerebral artery. Additionally, there is narrowing and irregularity in the proximal anterior and middle cerebral arteries, placing the patient at risk of anterior circulation infarction in the future.  Despite this extensive vascular disease, this acute right posterior cerebral artery territory infarction appears to be the patient's only previous cerebrovascular event.     CT the head  without contrast 10/25/2014 Evolving subacute infarct in the RIGHT PCA distribution. No complicating features.      Physical Exam  General: The patient is alert and cooperative at the time of the examination.  Skin: No significant peripheral edema is noted.  Neurologic Exam  Mental status: The patient is oriented x 3.  Cranial nerves: Facial symmetry is present. Speech is normal, no aphasia or dysarthria is noted. Extraocular movements are full. Visual fields are notable for a left homonymous visual field deficit.  Motor: The patient has good strength in all 4 extremities.  Sensory examination: Soft touch sensation is symmetric on the face, arms, and legs.  Coordination: The patient has good finger-nose-finger and heel-to-shin bilaterally.  Gait and station: The gait was not tested.  Reflexes: Deep tendon reflexes are symmetric.    ASSESSMENT/PLAN Ms. Cardell Peachsther Bonita Meaney is a 63 y.o. female with history of diabetes, hypertension, and dyslipidemia presenting with left sided visual complaints, left-sided numbness. She did not received TPA secondary to delay in presentation..   Stroke, right posterior cerebral artery distribution. The patient has evidence of diffuse intracranial atherosclerosis.  Resultant  right posterior temporal, occipital, and lateral thalamic stroke   MRI - see above  MRA - see above  Carotid Doppler: No acute findings.  2D Echo - EF 50-55%. No cardiac source of emboli identified.  LDL 111  HgbA1c: 7.9  Subcutaneous heparin for VTE prophylaxis  Diet Carb Modified thin liquids  On Plavix prior to admission  Patient counseled to be compliant with her antithrombotic medications  Ongoing aggressive stroke risk factor management  Therapy recommendations: Home health PT and OT recommended with 24-hour per day supervision.  Disposition:  Plan for discharge home  Hypertension  Home meds:   Metoprolol, Norvasc  Patient counseled to be  compliant with her blood pressure medications  Hyperlipidemia  Home meds:  Pravachol, 20 mg resumed in hospital  LDL 111, goal < 70  Increased to 40 mg daily of Pravachol upon discharge  Continue statin at discharge  Diabetes  HgbA1c 7.9  Will need better control of her blood sugars  Other Stroke Risk Factors  Advanced age   Body mass index is 28.01 kg/(m^2).   Coronary artery disease  Obstructive sleep apnea, on CPAP at home  History of TIA  Other Active Problems  Fibromyalgia  Depression  Irritable bowel syndrome  Other Pertinent History  Anxiety disorder  Previous cardiac stent - aspirin/Plavix prior to admission.  Hospital day # 3   Plan is for discharge today. [facility]   Delton Seeavid Rinehuls PA-C [facility] Pager [phone] 10/26/2014, 9:21 AM   The patient presented with sensory symptoms on the left side, left sided visual complaints. The patient is left mainly with a left homonymous visual field deficit. Family also reports that the patient has some memory issues. The patient has diffuse atherosclerosis intracranially, this may  be the source of the stroke.The patient was on Plavix and aspirin prior to coming in the hospital. The patient reports a prior history of TIA events. Currently, the patient has no significant coordination or motor deficits. She likely will not require extensive therapy.   Repeat a CT scan of the head - Evolving subacute infarct in the RIGHT PCA distribution. No complicating features.   Examination today surprised shows ongoing left homonymous visual field deficit, unchanged from yesterday.. Stroke workup is complete, the patient was on aspirin and Plavix secondary to coronary artery stent. Patient is to be discharged on these medications.     Lesly DukesWILLIS,Jahmiya Guidotti KEITH  To contact Stroke Continuity provider, please refer to [email or web address]. After hours, contact  General Neurology

## 2014-10-26 NOTE — Progress Notes (Signed)
Physical Therapy Treatment Patient Details Name: Shannon Shannon [name] MRN: [id number] DOB: 10/25/1951 Today's Date: 10/26/2014    History of Present Illness 63 yo female sudden onset of vision changes 10/21/14, tingling in L face arm and leg. CT(+) R temporal lobe MRI (+) R PCA posterior medial temporal lobe, occipital lobe hippocampus and R thalamus PMH: Dm, HTN, CAD, s/p carotid stenting, anxiety, depression, fibromyalgia,     PT Comments    Patient progressing slowly with mobility. Requires maximal encouragement and coaxing to participate in therapy. Tolerated ambulation without UE support today with only mild unsteadiness. Ambulation distance cut short secondary to sudden onset ankle pain through left. Tolerated stair negotiation with Min guard assist. Will continue to follow acutely.   Follow Up Recommendations  Supervision/Assistance - 24 hour;Home health PT     Equipment Recommendations  None recommended by PT    Recommendations for Other Services       Precautions / Restrictions Precautions Precautions: Fall Restrictions Weight Bearing Restrictions: No    Mobility  Bed Mobility Overal bed mobility: Needs Assistance Bed Mobility: Supine to Sit;Sit to Supine     Supine to sit: Supervision Sit to supine: Supervision   General bed mobility comments: HOB flat, no use of rails to simulate home environment.  Transfers Overall transfer level: Needs assistance Equipment used: None Transfers: Sit to/from Stand Sit to Stand: Supervision         General transfer comment: Supervision for safety. Verbal cues for hand placement.  Ambulation/Gait Ambulation/Gait assistance: Min guard Ambulation Distance (Feet): 200 Feet Assistive device: None Gait Pattern/deviations: Step-through pattern;Decreased stride length;Drifts right/left;Decreased stance time - left Gait velocity: slow   General Gait Details: Pt with slow, unsteady gait. Decreased arm swing BUEs. Sudden onset  left ankle pain during gait resulting in need to sit.   Stairs Stairs: Yes Stairs assistance: Min guard Stair Management: Step to pattern;One rail Left Number of Stairs: 5 General stair comments: Min guard for safety.   Wheelchair Mobility    Modified Rankin (Stroke Patients Only) Modified Rankin (Stroke Patients Only) Pre-Morbid Rankin Score: No symptoms Modified Rankin: Moderately severe disability     Balance Overall balance assessment: Needs assistance Sitting-balance support: Feet supported;No upper extremity supported Sitting balance-Leahy Scale: Good     Standing balance support: During functional activity Standing balance-Leahy Scale: Fair Standing balance comment: Tolerated performing dynamic standing activities - brushing hair, reaching for objects in bag without LOB.                     Cognition Arousal/Alertness: Awake/alert Behavior During Therapy: WFL for tasks assessed/performed Overall Cognitive Status: Within Functional Limits for tasks assessed                      Exercises      General Comments General comments (skin integrity, edema, etc.): Pt continues to have visual deficits- not able to see phone sitting in tray to her left, "where is my phone?" Difficulty using phone - trying to dial with it turned around.      Pertinent Vitals/Pain Pain Assessment: Faces Faces Pain Scale: Hurts whole lot Pain Location: right ankle at end of ambulation Pain Descriptors / Indicators: Sharp Pain Intervention(s): Limited activity within patient's tolerance;Repositioned;Monitored during session    Home Living                      Prior Function  PT Goals (current goals can now be found in the care plan section) Progress towards PT goals: Progressing toward goals    Frequency  Min 3X/week    PT Plan Current plan remains appropriate    Co-evaluation             End of Session Equipment Utilized During  Treatment: Gait belt Activity Tolerance: Patient limited by fatigue (" I am just so tired") Patient left: in bed;with call bell/phone within reach;with bed alarm set     Time: [id number] PT Time Calculation (min) (ACUTE ONLY): 15 min  Charges:  $Gait Training: 8-22 mins                    G CodesAlvie Shannon [name]:      Shannon [name], Shannon Shannon [name] 10/26/2014, 2:50 PM Shannon HeidelbergShauna Shannon [name], PT, DPT [phone]

## 2014-10-26 NOTE — Discharge Summary (Signed)
[facility]  Physician Discharge Summary   Patient ID: Shannon [name] MRN: [id number] DOB/AGE: [birth year] 65 y.o.  Admit date: 10/23/2014 Discharge date: 10/26/2014  PCP: Chari Manning, NP  DISCHARGE DIAGNOSES:  Principal Problem:   Acute CVA (cerebrovascular accident) Active Problems:   Hypertension, essential   Fibromyalgia   DM type 2 (diabetes mellitus, type 2)   Accelerated hypertension   RECOMMENDATIONS FOR OUTPATIENT FOLLOW UP: 1. Home health PT and OT. 2. May benefit from seeing a Psychiatrist.   DISCHARGE CONDITION: fair  Diet recommendation: Heart healthy  Filed Weights   10/24/14 0130  Weight: 71.7 kg (158 lb 1.1 oz)    INITIAL HISTORY: 63 year old female presented with weakness and tingling along with numbness on the left side. She was admitted for further workup.  Procedures:  Carotid Dopplers. No ICA stenosis bilaterally  2-D echocardiogram. Study Conclusions - Left ventricle: The cavity size was normal. There was moderateconcentric hypertrophy. Systolic function was normal. Theestimated ejection fraction was in the range of 50% to 55%. Wallmotion was normal; there were no regional wall motionabnormalities. Features are consistent with a pseudonormal leftventricular filling pattern, with concomitant abnormal relaxationand increased filling pressure (grade 2 diastolic dysfunction). - Mitral valve: Mildly calcified annulus. - Left atrium: The atrium was mildly dilated. - Atrial septum: There was increased thickness of the septum,consistent with lipomatous hypertrophy.  Consultations:  Neurology  HOSPITAL COURSE:   Acute stroke in the right PCA territory Stroke workup was initiated. Neurology was consulted. MRI revealed stroke as mentioned in report. Patient had defects in her visual fields. Echocardiogram and Carotid Doppler as above. She was taking both aspirin and Plavix at home prior to this admission and neurology recommends  continuing the same. She was seen by PT and OT and they recommend home health. LDL is 111. She is already on a statin. The dose was increased. She experienced worsening symptoms yesterday and so the CT was repeated which revealed stroke in evolution without complicating features. She feels better today and can be discharged.  Accelerated Hypertension She was allowed permissive hypertension. She may resume her home medications.   History of diabetes mellitus type 2. Continue home agents.  History of hypothyroidism. Continue with levothyroxine.  History of fibromyalgia. Continue home medication regimen.  History of anxiety and possibly depression She tells me that she's had these symptoms for a long time. Anxiety is also present. I think Lexapro may be a good agent of choice for her. This was initiated. She was given a prescription for a few tablets of Alprazolam. She may benefit from seeing a Psychiatrist and should pursue this with her PCP. She tells me she may have appointment with one next month. She denied suicidal and homicidal ideation.  Overall she is better. Still anxious and was reassured. [place] for discharge per neurology.  PERTINENT LABS:  The results of significant diagnostics from this hospitalization (including imaging, microbiology, ancillary and laboratory) are listed below for reference.     Labs: Basic Metabolic Panel:  Recent Labs Lab 10/23/14 1908 10/24/14 0059 10/24/14 0735  NA 140 141  --   K 4.3 4.4  --   CL 100 100  --   CO2 26 25  --   GLUCOSE 138* 105*  --   BUN 14 13  --   CREATININE 0.79 0.75 0.72  CALCIUM 10.3 10.0  --    Liver Function Tests:  Recent Labs Lab 10/24/14 0059  AST 24  ALT 17  ALKPHOS 77  BILITOT 0.3  PROT 7.1  ALBUMIN 4.1   CBC:  Recent Labs Lab 10/23/14 1908 10/24/14 0735  WBC 9.6 7.9  HGB 14.2 14.3  HCT 42.4 43.1  MCV 84.3 86.4  PLT 200 172   CBG:  Recent Labs Lab 10/25/14 1140 10/25/14 1646 10/25/14 2147  10/26/14 0805 10/26/14 1154  GLUCAP 220* 137* 159* 155* 134*     IMAGING STUDIES Dg Chest 2 View  10/23/2014   CLINICAL DATA:  Difficulty breathing  EXAM: CHEST  2 VIEW  COMPARISON:  July 01, 2013  FINDINGS: There is underlying emphysematous change. There is no edema or consolidation. The heart size and pulmonary vascularity are within normal limits. No adenopathy. There is degenerative change in the thoracic spine.  IMPRESSION: Underlying emphysematous change.  No edema or consolidation.   Electronically Signed   By: Lowella Grip M.D.   On: 10/23/2014 21:05   Ct Head Wo Contrast  10/25/2014   CLINICAL DATA:  CVA.  Diffuse headache with bilateral orbital pain.  EXAM: CT HEAD WITHOUT CONTRAST  TECHNIQUE: Contiguous axial images were obtained from the base of the skull through the vertex without intravenous contrast.  COMPARISON:  10/24/2014.  FINDINGS: Expected evolution of RIGHT PCA distribution infarct along the occipital horn and temporal horn of the RIGHT lateral ventricle. This involves the occipital and posterior RIGHT temporal lobes. The appearance is compatible with development of cytotoxic edema associated with the prior infarct. No infarct or hemorrhagic transformation. No mass lesion, midline shift or hydrocephalus. The calvarium appears normal. RIGHT nasal septal spur.  IMPRESSION: Evolving subacute infarct in the RIGHT PCA distribution. No complicating features.   Electronically Signed   By: Dereck Ligas M.D.   On: 10/25/2014 19:49   Ct Head (brain) Wo Contrast  10/23/2014   CLINICAL DATA:  63 year old female with history of dizziness, shortness of breath, numbness and tingling to the face and the entire left side of the body. Visual disturbance.  EXAM: CT HEAD WITHOUT CONTRAST  TECHNIQUE: Contiguous axial images were obtained from the base of the skull through the vertex without intravenous contrast.  COMPARISON:  Head CT 12/02/2012.  FINDINGS: In the medial aspect of the  right temporal lobe best demonstrated on images 13 and 14 of series 2 there is a area of low attenuation, which could represent age-indeterminate ischemia. No other acute intracranial abnormalities. Specifically, no evidence of acute intracranial hemorrhage, no mass, mass effect, hydrocephalus or abnormal intra or extra-axial fluid collections. Visualized paranasal sinuses and mastoids are well pneumatized. No acute displaced skull fractures are identified.  IMPRESSION: 1. Unusual area of low attenuation in the medial aspect of the right temporal lobe. Although this could conceivably represent some beam hardening artifact from the skull base, this is typically seen at lower levels, and findings are suspicious for potential age-indeterminate ischemia. This could be better evaluated with followup MRI of the brain with and without IV gadolinium. These results were called by telephone at the time of interpretation on 10/23/2014 at 8:30 pm to Dr. Leonides Schanz, who verbally acknowledged these results.   Electronically Signed   By: Vinnie Langton M.D.   On: 10/23/2014 20:33   Mr Jodene Nam Head Wo Contrast  10/24/2014   CLINICAL DATA:  Numbness and tingling of the face and left side of the body. Symptoms developed yesterday.  EXAM: MRI HEAD WITHOUT CONTRAST  MRA HEAD WITHOUT CONTRAST  TECHNIQUE: Multiplanar, multiecho pulse sequences of the brain and surrounding structures were obtained without intravenous contrast. Angiographic images of the head  were obtained using MRA technique without contrast.  COMPARISON:  Head CT 10/23/2014  FINDINGS: MRI HEAD FINDINGS  Diffusion imaging shows clustered foci of acute infarction affecting the right posterior medial temporal lobe, occipital lobe, hippocampus, and minimal punctate involvement of the lateral thalamus, consistent with infarction in the right posterior cerebral artery territory. No other acute infarction.  The cerebellum is unremarkable. The left cerebral hemisphere is normal.  There is no mass lesion, hemorrhage, hydrocephalus or extra-axial collection. No pituitary mass. No inflammatory sinus disease. No skull or skullbase lesion.  MRA HEAD FINDINGS  Both internal carotid arteries are patent into the brain. There is mild irregularity in the carotid siphon regions. There is narrowing and irregularity of the supra clinoid internal carotid arteries, proximal anterior and middle cerebral arteries. The A1 segment on the left is hypoplastic or severely disease.  Both vertebral arteries are patent to the basilar. No basilar stenosis. Posterior inferior cerebellar arteries show flow bilaterally. Superior cerebellar arteries show flow bilaterally. The left PCA is widely patent. The right PCA shows diminished distal perfusion beyond 1.5 cm.  IMPRESSION: Acute infarction in the right PCA territory affecting the posterior medial temporal lobe, occipital lobe, hippocampus and to a minimal extent the right thalamus. No mass effect or hemorrhage. MR angiography shows occlusion or markedly diminished flow in the right posterior cerebral artery. Additionally, there is narrowing and irregularity in the proximal anterior and middle cerebral arteries, placing the patient at risk of anterior circulation infarction in the future.  Despite this extensive vascular disease, this acute right posterior cerebral artery territory infarction appears to be the patient's only previous cerebrovascular event.   Electronically Signed   By: Nelson Chimes M.D.   On: 10/24/2014 11:58   Mr Brain Wo Contrast  10/24/2014   CLINICAL DATA:  Numbness and tingling of the face and left side of the body. Symptoms developed yesterday.  EXAM: MRI HEAD WITHOUT CONTRAST  MRA HEAD WITHOUT CONTRAST  TECHNIQUE: Multiplanar, multiecho pulse sequences of the brain and surrounding structures were obtained without intravenous contrast. Angiographic images of the head were obtained using MRA technique without contrast.  COMPARISON:  Head CT  10/23/2014  FINDINGS: MRI HEAD FINDINGS  Diffusion imaging shows clustered foci of acute infarction affecting the right posterior medial temporal lobe, occipital lobe, hippocampus, and minimal punctate involvement of the lateral thalamus, consistent with infarction in the right posterior cerebral artery territory. No other acute infarction.  The cerebellum is unremarkable. The left cerebral hemisphere is normal. There is no mass lesion, hemorrhage, hydrocephalus or extra-axial collection. No pituitary mass. No inflammatory sinus disease. No skull or skullbase lesion.  MRA HEAD FINDINGS  Both internal carotid arteries are patent into the brain. There is mild irregularity in the carotid siphon regions. There is narrowing and irregularity of the supra clinoid internal carotid arteries, proximal anterior and middle cerebral arteries. The A1 segment on the left is hypoplastic or severely disease.  Both vertebral arteries are patent to the basilar. No basilar stenosis. Posterior inferior cerebellar arteries show flow bilaterally. Superior cerebellar arteries show flow bilaterally. The left PCA is widely patent. The right PCA shows diminished distal perfusion beyond 1.5 cm.  IMPRESSION: Acute infarction in the right PCA territory affecting the posterior medial temporal lobe, occipital lobe, hippocampus and to a minimal extent the right thalamus. No mass effect or hemorrhage. MR angiography shows occlusion or markedly diminished flow in the right posterior cerebral artery. Additionally, there is narrowing and irregularity in the proximal anterior and middle  cerebral arteries, placing the patient at risk of anterior circulation infarction in the future.  Despite this extensive vascular disease, this acute right posterior cerebral artery territory infarction appears to be the patient's only previous cerebrovascular event.   Electronically Signed   By: Nelson Chimes M.D.   On: 10/24/2014 11:58    DISCHARGE  EXAMINATION: Filed Vitals:   10/25/14 2149 10/26/14 0149 10/26/14 0537 10/26/14 0939  BP: 165/78 159/76 163/61 194/82  Pulse: 77 74 66 75  Temp: 98.2 F (36.8 C) 98.6 F (37 C) 98.8 F (37.1 C) 98.4 F (36.9 C)  TempSrc: Oral Oral Oral Oral  Resp: _0 Height:      Weight:      SpO2: 97% 97% 97% 97%   General appearance: alert, cooperative, appears stated age and no distress Resp: clear to auscultation bilaterally Cardio: regular rate and rhythm, S1, S2 normal, no murmur, click, rub or gallop GI: soft, non-tender; bowel sounds normal; no masses,  no organomegaly  DISPOSITION: Home with home health  Discharge Instructions    Ambulatory referral to Nutrition and Diabetic Education    Complete by:  As directed      Call MD for:  difficulty breathing, headache or visual disturbances    Complete by:  As directed      Call MD for:  extreme fatigue    Complete by:  As directed      Call MD for:  persistant dizziness or light-headedness    Complete by:  As directed      Call MD for:  persistant nausea and vomiting    Complete by:  As directed      Call MD for:  severe uncontrolled pain    Complete by:  As directed      Call MD for:  temperature >100.4    Complete by:  As directed      Diet - low sodium heart healthy    Complete by:  As directed      Discharge instructions    Complete by:  As directed   Close follow up with PCP. You may benefit from seeing a Psychiatrist for your anxiety and Depression. Please talk to your doctor about the same.     Increase activity slowly    Complete by:  As directed            ALLERGIES:  Allergies  Allergen Reactions  . Glimepiride     DIARRHEA GIVES PROBLEMS WITH IBS  . Sulfa Antibiotics Other (See Comments)    Childhood allergy    Current Discharge Medication List    START taking these medications   Details  ALPRAZolam (XANAX) 0.5 MG tablet Take 1 tablet (0.5 mg total) by mouth daily as needed for anxiety. Qty: 15  tablet, Refills: 0    escitalopram (LEXAPRO) 10 MG tablet Take 1 tablet (10 mg total) by mouth daily. Qty: 30 tablet, Refills: 0      CONTINUE these medications which have CHANGED   Details  pravastatin (PRAVACHOL) 40 MG tablet Take 1 tablet (40 mg total) by mouth daily. Qty: 30 tablet, Refills: 3   Associated Diagnoses: History of MI (myocardial infarction)      CONTINUE these medications which have NOT CHANGED   Details  amLODipine (NORVASC) 5 MG tablet Take 1 tablet (5 mg total) by mouth daily. Qty: 30 tablet, Refills: 3   Associated Diagnoses: Essential hypertension    aspirin EC 81 MG tablet Take 81 mg by  mouth every morning.    clopidogrel (PLAVIX) 75 MG tablet Take 1 tablet (75 mg total) by mouth daily with breakfast. Qty: 30 tablet, Refills: 6   Associated Diagnoses: History of MI (myocardial infarction)    !! gabapentin (NEURONTIN) 100 MG capsule Take 100 mg by mouth daily as needed (pain).    glimepiride (AMARYL) 2 MG tablet Take 1 tablet (2 mg total) by mouth 2 (two) times daily. Qty: 60 tablet, Refills: 3   Associated Diagnoses: Type 2 diabetes mellitus without complication    glucose blood test strip Check blood sugar before each meal and at bedtime total 4 times per day Qty: 100 each, Refills: 12   Associated Diagnoses: Type 2 diabetes mellitus without complication    glucose monitoring kit (FREESTYLE) monitoring kit Test blood sugars 3 times per day as prescribed Qty: 1 each, Refills: 0   Associated Diagnoses: Type 2 diabetes mellitus without complication    hydrOXYzine (ATARAX/VISTARIL) 10 MG tablet Take 1 tablet (10 mg total) by mouth 3 (three) times daily as needed. Qty: 30 tablet, Refills: 3   Associated Diagnoses: Anxiety state, unspecified    isosorbide mononitrate (IMDUR) 30 MG 24 hr tablet TAKE 1 TABLET (30 MG TOTAL) BY MOUTH EVERY MORNING. Qty: 30 tablet, Refills: 1    Lancets (FREESTYLE) lancets Check blood sugar four times per day. Before each  meal and at bedtime Qty: 100 each, Refills: 12   Associated Diagnoses: Type 2 diabetes mellitus without complication    levothyroxine (SYNTHROID, LEVOTHROID) 25 MCG tablet TAKE 1 TABLET (25 MCG TOTAL) BY MOUTH DAILY BEFORE BREAKFAST. Qty: 30 tablet, Refills: 2    metoprolol (LOPRESSOR) 50 MG tablet Take 1 tablet (50 mg total) by mouth 2 (two) times daily. For blood pressure Qty: 60 tablet, Refills: 3    nitroGLYCERIN (NITROSTAT) 0.4 MG SL tablet Place 1 tablet (0.4 mg total) under the tongue every 5 (five) minutes as needed for chest pain. Qty: 25 tablet, Refills: 0   Associated Diagnoses: History of MI (myocardial infarction)    omeprazole (PRILOSEC) 20 MG capsule Take 1 capsule (20 mg total) by mouth daily. For acid reflux Qty: 30 capsule, Refills: 3   Associated Diagnoses: Acid reflux    triamcinolone cream (KENALOG) 0.1 % Apply 1 application topically 2 (two) times daily. Do not apply to face Qty: 30 g, Refills: 0   Associated Diagnoses: Contact dermatitis    !! gabapentin (NEURONTIN) 300 MG capsule Take 1 capsule (300 mg total) by mouth 3 (three) times daily. Qty: 90 capsule, Refills: 3   Associated Diagnoses: Neuropathy    vitamin E (VITAMIN E) 400 UNIT capsule Take 400 Units by mouth every morning.     !! - Potential duplicate medications found. Please discuss with provider.     Follow-up Information    Follow up with Chari Manning, NP. Schedule an appointment as soon as possible for a visit in 1 week.   Specialty:  Internal Medicine   Why:  post hospitalization follow up   Contact information:   [place] [address] [phone]       Follow up with Lenor Coffin, MD. Schedule an appointment as soon as possible for a visit in 2 months.   Specialty:  Neurology   Contact information:   [address] [place] [address] [place]: 88 mins  [facility] Pager  [phone]  10/26/2014, 12:44 PM

## 2014-10-26 NOTE — Discharge Instructions (Signed)
Ischemic Stroke °A stroke (cerebrovascular accident) is the sudden death of brain tissue. It is a medical emergency. A stroke can cause permanent loss of brain function. This can cause problems with different parts of your body. A transient ischemic attack (TIA) is different because it does not cause permanent damage. A TIA is a short-lived problem of poor blood flow affecting a part of the brain. A TIA is also a serious problem because having a TIA greatly increases the chances of having a stroke. When symptoms first develop, you cannot know if the problem might be a stroke or a TIA. °CAUSES  °A stroke is caused by a decrease of oxygen supply to an area of your brain. It is usually the result of a small blood clot or collection of cholesterol or fat (plaque) that blocks blood flow in the brain. A stroke can also be caused by blocked or damaged carotid arteries.  °RISK FACTORS °· High blood pressure (hypertension). °· High cholesterol. °· Diabetes mellitus. °· Heart disease. °· The buildup of plaque in the blood vessels (peripheral artery disease or atherosclerosis). °· The buildup of plaque in the blood vessels providing blood and oxygen to the brain (carotid artery stenosis). °· An abnormal heart rhythm (atrial fibrillation). °· Obesity. °· Smoking. °· Taking oral contraceptives (especially in combination with smoking). °· Physical inactivity. °· A diet high in fats, salt (sodium), and calories. °· Alcohol use. °· Use of illegal drugs (especially cocaine and methamphetamine). °· Being African American. °· Being over the age of 55. °· Family history of stroke. °· Previous history of blood clots, stroke, TIA, or heart attack. °· Sickle cell disease. °SYMPTOMS  °These symptoms usually develop suddenly, or may be newly present upon awakening from sleep: °· Sudden weakness or numbness of the face, arm, or leg, especially on one side of the body. °· Sudden trouble walking or difficulty moving arms or legs. °· Sudden  confusion. °· Sudden personality changes. °· Trouble speaking (aphasia) or understanding. °· Difficulty swallowing. °· Sudden trouble seeing in one or both eyes. °· Double vision. °· Dizziness. °· Loss of balance or coordination. °· Sudden severe headache with no known cause. °· Trouble reading or writing. °DIAGNOSIS  °Your health care provider can often determine the presence or absence of a stroke based on your symptoms, history, and physical exam. Computed tomography (CT) of the brain is usually performed to confirm the stroke, determine causes, and determine stroke severity. Other tests may be done to find the cause of the stroke. These tests may include: °· Electrocardiography. °· Continuous heart monitoring. °· Echocardiography. °· Carotid ultrasonography. °· Magnetic resonance imaging (MRI). °· A scan of the brain circulation. °· Blood tests. °PREVENTION  °The risk of a stroke can be decreased by appropriately treating high blood pressure, high cholesterol, diabetes, heart disease, and obesity and by quitting smoking, limiting alcohol, and staying physically active. °TREATMENT  °Time is of the essence. It is important to seek treatment at the first sign of these symptoms because you may receive a medicine to dissolve the clot (thrombolytic) that cannot be given if too much time has passed since your symptoms began. Even if you do not know when your symptoms began, get treatment as soon as possible as there are other treatment options available including oxygen, intravenous (IV) fluids, and medicines to thin the blood (anticoagulants). Treatment of stroke depends on the duration, severity, and cause of your symptoms. Medicines and dietary changes may be used to address diabetes, high blood   pressure, and other risk factors. Physical, speech, and occupational therapists will assess you and work with you to improve any functions impaired by the stroke. Measures will be taken to prevent short-term and long-term  complications, including infection from breathing foreign material into the lungs (aspiration pneumonia), blood clots in the legs, bedsores, and falls. Rarely, surgery may be needed to remove large blood clots or to open up blocked arteries. °HOME CARE INSTRUCTIONS  °· Take medicines only as directed by your health care provider. Follow the directions carefully. Medicines may be used to control risk factors for a stroke. Be sure you understand all your medicine instructions. °· You may be told to take a medicine to thin the blood, such as aspirin or the anticoagulant warfarin. Warfarin needs to be taken exactly as instructed. °¨ Too much and too little warfarin are both dangerous. Too much warfarin increases the risk of bleeding. Too little warfarin continues to allow the risk for blood clots. While taking warfarin, you will need to have regular blood tests to measure your blood clotting time. These blood tests usually include both the PT and INR tests. The PT and INR results allow your health care provider to adjust your dose of warfarin. The dose can change for many reasons. It is critically important that you take warfarin exactly as prescribed, and that you have your PT and INR levels drawn exactly as directed. °¨ Many foods, especially foods high in vitamin K, can interfere with warfarin and affect the PT and INR results. Foods high in vitamin K include spinach, kale, broccoli, cabbage, collard and turnip greens, brussels sprouts, peas, cauliflower, seaweed, and parsley, as well as beef and pork liver, green tea, and soybean oil. You should eat a consistent amount of foods high in vitamin K. Avoid major changes in your diet, or notify your health care provider before changing your diet. Arrange a visit with a dietitian to answer your questions. °¨ Many medicines can interfere with warfarin and affect the PT and INR results. You must tell your health care provider about any and all medicines you take. This  includes all vitamins and supplements. Be especially cautious with aspirin and anti-inflammatory medicines. Do not take or discontinue any prescribed or over-the-counter medicine except on the advice of your health care provider or pharmacist. °¨ Warfarin can have side effects, such as excessive bruising or bleeding. You will need to hold pressure over cuts for longer than usual. Your health care provider or pharmacist will discuss other potential side effects. °¨ Avoid sports or activities that may cause injury or bleeding. °¨ Be mindful when shaving, flossing your teeth, or handling sharp objects. °¨ Alcohol can change the body's ability to handle warfarin. It is best to avoid alcoholic drinks or consume only very small amounts while taking warfarin. Notify your health care provider if you change your alcohol intake. °¨ Notify your dentist or other health care providers before procedures. °· If swallow studies have determined that your swallowing reflex is present, you should eat healthy foods. Including 5 or more servings of fruits and vegetables a day may reduce the risk of stroke. Foods may need to be a certain consistency (soft or pureed), or small bites may need to be taken in order to avoid aspirating or choking. Certain dietary changes may be advised to address high blood pressure, high cholesterol, diabetes, or obesity. °¨ Food choices that are low in sodium, saturated fat, trans fat, and cholesterol are recommended to manage high blood pressure. °¨   Food choies that are high in fiber, and low in saturated fat, trans fat, and cholesterol may control cholesterol levels. °¨ Controlling carbohydrates and sugar intake is recommended to manage diabetes. °¨ Reducing calorie intake and making food choices that are low in sodium, saturated fat, trans fat, and cholesterol are recommended to manage obesity. °· Maintain a healthy weight. °· Stay physically active. It is recommended that you get at least 30 minutes of  activity on all or most days. °· Do not use any tobacco products including cigarettes, chewing tobacco, or electronic cigarettes. °· Limit alcohol use even if you are not taking warfarin. Moderate alcohol use is considered to be: °¨ No more than 2 drinks each day for men. °¨ No more than 1 drink each day for nonpregnant women. °· Home safety. A safe home environment is important to reduce the risk of falls. Your health care provider may arrange for specialists to evaluate your home. Having grab bars in the bedroom and bathroom is often important. Your health care provider may arrange for equipment to be used at home, such as raised toilets and a seat for the shower. °· Physical, occupational, and speech therapy. Ongoing therapy may be needed to maximize your recovery after a stroke. If you have been advised to use a walker or a cane, use it at all times. Be sure to keep your therapy appointments. °· Follow all instructions for follow-up with your health care provider. This is very important. This includes any referrals, physical therapy, rehabilitation, and lab tests. Proper follow-up can prevent another stroke from occurring. °SEEK MEDICAL CARE IF: °· You have personality changes. °· You have difficulty swallowing. °· You are seeing double. °· You have dizziness. °· You have a fever. °· You have skin breakdown. °SEEK IMMEDIATE MEDICAL CARE IF:  °Any of these symptoms may represent a serious problem that is an emergency. Do not wait to see if the symptoms will go away. Get medical help right away. Call your local emergency services (911 in [place] Do not drive yourself to the hospital. °· You have sudden weakness or numbness of the face, arm, or leg, especially on one side of the body. °· You have sudden trouble walking or difficulty moving arms or legs. °· You have sudden confusion. °· You have trouble speaking (aphasia) or understanding. °· You have sudden trouble seeing in one or both eyes. °· You have a loss of  balance or coordination. °· You have a sudden, severe headache with no known cause. °· You have new chest pain or an irregular heartbeat. °· You have a partial or total loss of consciousness. °Document Released: 10/26/2005 Document Revised: 03/12/2014 Document Reviewed: 06/05/2012 °Exit[employer]® Patient Information ©2015 [employer]. This information is not intended to replace advice given to you by your health care provider. Make sure you discuss any questions you have with your health care provider. ° °

## 2014-10-26 NOTE — Progress Notes (Signed)
CARE MANAGEMENT NOTE 10/26/2014  Patient:  Cardell PeachDUNN,Carlon BONITA   Account Number:  [id number]  Date Initiated:  10/26/2014  Documentation initiated by:  [facility]  Subjective/Objective Assessment:   CVA     Action/Plan:   lives with friend   Anticipated DC Date:  10/26/2014   Anticipated DC Plan:  HOME W HOME HEALTH SERVICES      DC Planning Services  CM consult      [facility]   Choice offered to / List presented to:  C-1 Patient   DME arranged  3-N-1  SHOWER STOOL  WALKER - ROLLING      DME agency  [facility]     HH arranged  HH-2 PT  HH-3 OT      [facility]   Status of service:  Completed, signed off Medicare Important Message given?  YES (If response is "NO", the following Medicare IM given date fields will be blank) Date Medicare IM given:  10/26/2014 Medicare IM given by:  [facility] Date Additional Medicare IM given:   Additional Medicare IM given by:    Discharge Disposition:  HOME W HOME HEALTH SERVICES  Per UR Regulation:    If discussed at Long Length of Stay Meetings, dates discussed:    Comments:  10/26/2014 1400 NCM spoke to pt and [facility] for [facility]. Requested 3n1, RW and shower stool. [facility] reps [facility] today. Isidoro DonningAlesia Keyton Bhat RN CCM Case Mgmt phone [phone]

## 2014-10-29 LAB — GLUCOSE, CAPILLARY: Glucose-Capillary: 154 mg/dL — ABNORMAL HIGH (ref 70–99)

## 2014-10-30 ENCOUNTER — Telehealth: Payer: Self-pay | Admitting: Cardiology

## 2014-10-30 ENCOUNTER — Encounter: Payer: Self-pay | Admitting: Cardiology

## 2014-10-30 NOTE — Telephone Encounter (Signed)
New Message        Physical therapist from [facility] calling needing verbal orders for nurse to check Hemoglobin, Diabetic education, and Social Worker (per pt's request). Please call back and advise.

## 2014-10-30 NOTE — Telephone Encounter (Signed)
Left message advising Dr Anne FuSkains is not in this week to give this order and pt's PCP should sign for these type of orders.  Will forward to Dr Anne FuSkains for review in case he is willing to sign for diabetic edu, hemoglobin and Child psychotherapistocial Worker.

## 2014-11-05 NOTE — Telephone Encounter (Signed)
Agree, please have primary physician sign for these orders.

## 2014-11-17 ENCOUNTER — Inpatient Hospital Stay (HOSPITAL_COMMUNITY)
Admission: EM | Admit: 2014-11-17 | Discharge: 2014-11-20 | DRG: 281 | Disposition: A | Discharge status: Home / Self Care | Payer: Medicare Other | Attending: Cardiology | Admitting: Cardiology

## 2014-11-17 ENCOUNTER — Emergency Department (HOSPITAL_COMMUNITY): Payer: Medicare Other

## 2014-11-17 ENCOUNTER — Encounter (HOSPITAL_COMMUNITY): Payer: Self-pay | Admitting: *Deleted

## 2014-11-17 DIAGNOSIS — Z955 Presence of coronary angioplasty implant and graft: Secondary | ICD-10-CM

## 2014-11-17 DIAGNOSIS — M1990 Unspecified osteoarthritis, unspecified site: Secondary | ICD-10-CM | POA: Diagnosis present

## 2014-11-17 DIAGNOSIS — R079 Chest pain, unspecified: Secondary | ICD-10-CM

## 2014-11-17 DIAGNOSIS — Z888 Allergy status to other drugs, medicaments and biological substances status: Secondary | ICD-10-CM | POA: Diagnosis not present

## 2014-11-17 DIAGNOSIS — Z9049 Acquired absence of other specified parts of digestive tract: Secondary | ICD-10-CM | POA: Diagnosis present

## 2014-11-17 DIAGNOSIS — I214 Non-ST elevation (NSTEMI) myocardial infarction: Secondary | ICD-10-CM | POA: Diagnosis not present

## 2014-11-17 DIAGNOSIS — Z79899 Other long term (current) drug therapy: Secondary | ICD-10-CM

## 2014-11-17 DIAGNOSIS — Z882 Allergy status to sulfonamides status: Secondary | ICD-10-CM | POA: Diagnosis not present

## 2014-11-17 DIAGNOSIS — I69398 Other sequelae of cerebral infarction: Secondary | ICD-10-CM | POA: Diagnosis not present

## 2014-11-17 DIAGNOSIS — M797 Fibromyalgia: Secondary | ICD-10-CM | POA: Diagnosis present

## 2014-11-17 DIAGNOSIS — E1165 Type 2 diabetes mellitus with hyperglycemia: Secondary | ICD-10-CM | POA: Diagnosis not present

## 2014-11-17 DIAGNOSIS — Z833 Family history of diabetes mellitus: Secondary | ICD-10-CM

## 2014-11-17 DIAGNOSIS — E039 Hypothyroidism, unspecified: Secondary | ICD-10-CM | POA: Diagnosis present

## 2014-11-17 DIAGNOSIS — Z8249 Family history of ischemic heart disease and other diseases of the circulatory system: Secondary | ICD-10-CM

## 2014-11-17 DIAGNOSIS — K589 Irritable bowel syndrome without diarrhea: Secondary | ICD-10-CM | POA: Diagnosis present

## 2014-11-17 DIAGNOSIS — I25110 Atherosclerotic heart disease of native coronary artery with unstable angina pectoris: Secondary | ICD-10-CM | POA: Diagnosis not present

## 2014-11-17 DIAGNOSIS — I493 Ventricular premature depolarization: Secondary | ICD-10-CM | POA: Diagnosis not present

## 2014-11-17 DIAGNOSIS — M79642 Pain in left hand: Secondary | ICD-10-CM | POA: Diagnosis present

## 2014-11-17 DIAGNOSIS — H539 Unspecified visual disturbance: Secondary | ICD-10-CM | POA: Diagnosis present

## 2014-11-17 DIAGNOSIS — E785 Hyperlipidemia, unspecified: Secondary | ICD-10-CM | POA: Diagnosis present

## 2014-11-17 DIAGNOSIS — G4733 Obstructive sleep apnea (adult) (pediatric): Secondary | ICD-10-CM | POA: Diagnosis present

## 2014-11-17 DIAGNOSIS — Z9861 Coronary angioplasty status: Secondary | ICD-10-CM

## 2014-11-17 DIAGNOSIS — R072 Precordial pain: Secondary | ICD-10-CM

## 2014-11-17 DIAGNOSIS — I2510 Atherosclerotic heart disease of native coronary artery without angina pectoris: Secondary | ICD-10-CM

## 2014-11-17 DIAGNOSIS — F329 Major depressive disorder, single episode, unspecified: Secondary | ICD-10-CM | POA: Diagnosis present

## 2014-11-17 DIAGNOSIS — I6330 Cerebral infarction due to thrombosis of unspecified cerebral artery: Secondary | ICD-10-CM | POA: Diagnosis not present

## 2014-11-17 DIAGNOSIS — F419 Anxiety disorder, unspecified: Secondary | ICD-10-CM | POA: Diagnosis present

## 2014-11-17 DIAGNOSIS — Z7982 Long term (current) use of aspirin: Secondary | ICD-10-CM

## 2014-11-17 DIAGNOSIS — I639 Cerebral infarction, unspecified: Secondary | ICD-10-CM | POA: Diagnosis present

## 2014-11-17 DIAGNOSIS — I69354 Hemiplegia and hemiparesis following cerebral infarction affecting left non-dominant side: Secondary | ICD-10-CM | POA: Diagnosis not present

## 2014-11-17 DIAGNOSIS — Z7902 Long term (current) use of antithrombotics/antiplatelets: Secondary | ICD-10-CM

## 2014-11-17 DIAGNOSIS — R531 Weakness: Secondary | ICD-10-CM

## 2014-11-17 DIAGNOSIS — I10 Essential (primary) hypertension: Secondary | ICD-10-CM | POA: Diagnosis present

## 2014-11-17 DIAGNOSIS — R52 Pain, unspecified: Secondary | ICD-10-CM

## 2014-11-17 DIAGNOSIS — E119 Type 2 diabetes mellitus without complications: Secondary | ICD-10-CM

## 2014-11-17 LAB — COMPREHENSIVE METABOLIC PANEL
ALK PHOS: 76 U/L (ref 39–117)
ALT: 25 U/L (ref 0–35)
AST: 26 U/L (ref 0–37)
Albumin: 3.7 g/dL (ref 3.5–5.2)
Anion gap: 12 (ref 5–15)
BILIRUBIN TOTAL: 0.5 mg/dL (ref 0.3–1.2)
BUN: 8 mg/dL (ref 6–23)
CO2: 21 mmol/L (ref 19–32)
CREATININE: 0.79 mg/dL (ref 0.50–1.10)
Calcium: 9 mg/dL (ref 8.4–10.5)
Chloride: 103 mEq/L (ref 96–112)
GFR calc Af Amer: >90 mL/min (ref >90)
GFR calc non Af Amer: 86 mL/min — ABNORMAL LOW (ref >90)
GLUCOSE: 232 mg/dL — AB (ref 70–99)
POTASSIUM: 4 mmol/L (ref 3.5–5.1)
Sodium: 136 mmol/L (ref 135–145)
Total Protein: 5.9 g/dL — ABNORMAL LOW (ref 6.0–8.3)

## 2014-11-17 LAB — CBC WITH DIFFERENTIAL/PLATELET
BASOS PCT: 0 % (ref 0–1)
Basophils Absolute: 0 10*3/uL (ref 0.0–0.1)
EOS ABS: 0.3 10*3/uL (ref 0.0–0.7)
Eosinophils Relative: 2 % (ref 0–5)
HEMATOCRIT: 39.6 % (ref 36.0–46.0)
Hemoglobin: 13.3 g/dL (ref 12.0–15.0)
Lymphocytes Relative: 14 % (ref 12–46)
Lymphs Abs: 1.6 10*3/uL (ref 0.7–4.0)
MCH: 28.2 pg (ref 26.0–34.0)
MCHC: 33.6 g/dL (ref 30.0–36.0)
MCV: 84.1 fL (ref 78.0–100.0)
MONO ABS: 0.9 10*3/uL (ref 0.1–1.0)
Monocytes Relative: 8 % (ref 3–12)
Neutro Abs: 8.5 10*3/uL — ABNORMAL HIGH (ref 1.7–7.7)
Neutrophils Relative %: 76 % (ref 43–77)
Platelets: 188 10*3/uL (ref 150–400)
RBC: 4.71 MIL/uL (ref 3.87–5.11)
RDW: 13.2 % (ref 11.5–15.5)
WBC: 11.3 10*3/uL — ABNORMAL HIGH (ref 4.0–10.5)

## 2014-11-17 LAB — I-STAT TROPONIN, ED: Troponin i, poc: 0.03 ng/mL (ref 0.00–0.08)

## 2014-11-17 LAB — GLUCOSE, CAPILLARY: Glucose-Capillary: 153 mg/dL — ABNORMAL HIGH (ref 70–99)

## 2014-11-17 LAB — TROPONIN I: TROPONIN I: 1.06 ng/mL — AB (ref <0.031)

## 2014-11-17 MED ORDER — NITROGLYCERIN 0.4 MG SL SUBL
0.4000 mg | SUBLINGUAL_TABLET | SUBLINGUAL | Status: DC | PRN
  Administered 2014-11-18 – 2014-11-19 (×3): 0.4 mg via SUBLINGUAL
  Filled 2014-11-17 (×2): qty 1

## 2014-11-17 MED ORDER — HEPARIN (PORCINE) IN NACL 100-0.45 UNIT/ML-% IJ SOLN
850.0000 [IU]/h | INTRAMUSCULAR | Status: DC
  Administered 2014-11-17: 1000 [IU]/h via INTRAVENOUS
  Administered 2014-11-18 – 2014-11-20 (×2): 850 [IU]/h via INTRAVENOUS
  Filled 2014-11-17 (×3): qty 250

## 2014-11-17 MED ORDER — ASPIRIN 81 MG PO CHEW: 324.0000 mg | CHEWABLE_TABLET | ORAL | Status: DC

## 2014-11-17 MED ORDER — PANTOPRAZOLE SODIUM 40 MG PO TBEC
40.0000 mg | DELAYED_RELEASE_TABLET | Freq: Every day | ORAL | Status: DC
  Administered 2014-11-17 – 2014-11-20 (×4): 40 mg via ORAL
  Filled 2014-11-17 (×3): qty 1

## 2014-11-17 MED ORDER — ENOXAPARIN SODIUM 40 MG/0.4ML ~~LOC~~ SOLN
40.0000 mg | Freq: Every day | SUBCUTANEOUS | Status: DC
  Administered 2014-11-17: 40 mg via SUBCUTANEOUS
  Filled 2014-11-17: qty 0.4

## 2014-11-17 MED ORDER — ONDANSETRON HCL 4 MG/2ML IJ SOLN
4.0000 mg | Freq: Once | INTRAMUSCULAR | Status: AC
  Administered 2014-11-17: 4 mg via INTRAVENOUS
  Filled 2014-11-17: qty 2

## 2014-11-17 MED ORDER — HEPARIN BOLUS VIA INFUSION
4000.0000 [IU] | Freq: Once | INTRAVENOUS | Status: AC
  Administered 2014-11-17: 4000 [IU] via INTRAVENOUS
  Filled 2014-11-17: qty 4000

## 2014-11-17 MED ORDER — ISOSORBIDE MONONITRATE ER 30 MG PO TB24
30.0000 mg | ORAL_TABLET | Freq: Every day | ORAL | Status: DC
  Administered 2014-11-17 – 2014-11-20 (×4): 30 mg via ORAL
  Filled 2014-11-17 (×4): qty 1

## 2014-11-17 MED ORDER — CLOPIDOGREL BISULFATE 75 MG PO TABS
75.0000 mg | ORAL_TABLET | Freq: Every day | ORAL | Status: DC
  Administered 2014-11-17 – 2014-11-20 (×4): 75 mg via ORAL
  Filled 2014-11-17 (×5): qty 1

## 2014-11-17 MED ORDER — VITAMIN E 180 MG (400 UNIT) PO CAPS
400.0000 [IU] | ORAL_CAPSULE | Freq: Every morning | ORAL | Status: DC
  Administered 2014-11-17 – 2014-11-20 (×4): 400 [IU] via ORAL
  Filled 2014-11-17 (×4): qty 1

## 2014-11-17 MED ORDER — METOPROLOL TARTRATE 50 MG PO TABS
50.0000 mg | ORAL_TABLET | Freq: Two times a day (BID) | ORAL | Status: DC
  Administered 2014-11-17 – 2014-11-20 (×6): 50 mg via ORAL
  Filled 2014-11-17 (×8): qty 1

## 2014-11-17 MED ORDER — NITROGLYCERIN 0.4 MG SL SUBL
0.4000 mg | SUBLINGUAL_TABLET | SUBLINGUAL | Status: DC | PRN
  Administered 2014-11-17: 0.4 mg via SUBLINGUAL

## 2014-11-17 MED ORDER — HYDROXYZINE HCL 10 MG PO TABS
10.0000 mg | ORAL_TABLET | Freq: Three times a day (TID) | ORAL | Status: DC | PRN
  Filled 2014-11-17: qty 1

## 2014-11-17 MED ORDER — ASPIRIN 300 MG RE SUPP: 300.0000 mg | RECTAL | Status: DC

## 2014-11-17 MED ORDER — ASPIRIN EC 81 MG PO TBEC
81.0000 mg | DELAYED_RELEASE_TABLET | Freq: Every morning | ORAL | Status: DC
  Administered 2014-11-17 – 2014-11-20 (×4): 81 mg via ORAL
  Filled 2014-11-17 (×4): qty 1

## 2014-11-17 MED ORDER — GI COCKTAIL ~~LOC~~
30.0000 mL | Freq: Three times a day (TID) | ORAL | Status: DC | PRN
Start: 2014-11-17 — End: 2014-11-20
  Administered 2014-11-17 – 2014-11-18 (×3): 30 mL via ORAL
  Filled 2014-11-17 (×5): qty 30

## 2014-11-17 MED ORDER — ESCITALOPRAM OXALATE 10 MG PO TABS
10.0000 mg | ORAL_TABLET | Freq: Every day | ORAL | Status: DC
  Administered 2014-11-17 – 2014-11-20 (×4): 10 mg via ORAL
  Filled 2014-11-17 (×4): qty 1

## 2014-11-17 MED ORDER — AMLODIPINE BESYLATE 5 MG PO TABS
5.0000 mg | ORAL_TABLET | Freq: Every day | ORAL | Status: DC
  Administered 2014-11-17 – 2014-11-20 (×4): 5 mg via ORAL
  Filled 2014-11-17 (×4): qty 1

## 2014-11-17 MED ORDER — GABAPENTIN 100 MG PO CAPS
100.0000 mg | ORAL_CAPSULE | Freq: Every day | ORAL | Status: DC | PRN
  Filled 2014-11-17: qty 1

## 2014-11-17 MED ORDER — ALPRAZOLAM 0.5 MG PO TABS
0.5000 mg | ORAL_TABLET | Freq: Every day | ORAL | Status: DC | PRN
  Administered 2014-11-17 – 2014-11-19 (×3): 0.5 mg via ORAL
  Filled 2014-11-17 (×3): qty 1

## 2014-11-17 MED ORDER — ONDANSETRON HCL 4 MG/2ML IJ SOLN
4.0000 mg | Freq: Four times a day (QID) | INTRAMUSCULAR | Status: DC | PRN
  Administered 2014-11-19 (×2): 4 mg via INTRAVENOUS
  Filled 2014-11-17 (×2): qty 2

## 2014-11-17 MED ORDER — LEVOTHYROXINE SODIUM 25 MCG PO TABS
25.0000 ug | ORAL_TABLET | Freq: Every day | ORAL | Status: DC
  Administered 2014-11-17 – 2014-11-20 (×4): 25 ug via ORAL
  Filled 2014-11-17 (×6): qty 1

## 2014-11-17 MED ORDER — ASPIRIN EC 81 MG PO TBEC: 81.0000 mg | DELAYED_RELEASE_TABLET | Freq: Every day | ORAL | Status: DC

## 2014-11-17 MED ORDER — ACETAMINOPHEN 325 MG PO TABS
650.0000 mg | ORAL_TABLET | ORAL | Status: DC | PRN
  Administered 2014-11-17 – 2014-11-18 (×3): 650 mg via ORAL
  Filled 2014-11-17 (×3): qty 2

## 2014-11-17 MED ORDER — PRAVASTATIN SODIUM 40 MG PO TABS
40.0000 mg | ORAL_TABLET | Freq: Every day | ORAL | Status: DC
  Administered 2014-11-17 – 2014-11-18 (×2): 40 mg via ORAL
  Filled 2014-11-17 (×4): qty 1

## 2014-11-17 NOTE — ED Provider Notes (Signed)
CSN: [id number]     Arrival date & time 11/17/14  0105 History  This chart was scribed for Wandra Arthurs, MD by Delphia Grates, ED Scribe. This patient was seen in room A11C/A11C and the patient's care was started at 1:08 AM.   Chief Complaint  Patient presents with  . Chest Pain    The history is provided by the patient and the EMS personnel. No language interpreter was used.     HPI Comments: Shannon [name] is a 64 y.o. female, with history of DM, arthritis, fibromyalgia, and CAD with a stent, brought in by ambulance, who presents to the Emergency Department complaining of sudden onset central chest pain that began approximately 5 hours ago. Patient states the pain radiates to her back and lasted "for awhile". She reports she took 324 mg aspirin and 4 nitroglycerin PTA of EMS without relief. Per EMS, the patient was given an addition nitroglycerin which alleviated the pain. Patient states her nitroglycdrin may be expired. She notes she typically experiences a burning sensation in her chest prior to onset of chest pain. She reports history of MI and states this feels similar. Patient states she has 1 stent that was placed in 2010. She states that pain similar to previous stent. Patient denies SOB or trouble breathing, currently.   Past Medical History  Diagnosis Date  . Hypertension   . Coronary artery disease   . Diabetes mellitus without complication   . Arthritis   . Fibromyalgia   . Irritable bowel   . Anxiety   . Depression   . Fibromyalgia 1995   Past Surgical History  Procedure Laterality Date  . Coronary stent placement    . Cholecystectomy     Family History  Problem Relation Age of Onset  . Cancer Mother   . Diabetes Mother   . Hypertension Mother   . Heart failure Mother   . Cancer Father   . Diabetes Father   . Hypertension Father   . Heart failure Father   . CAD Father   . CAD Mother   . CAD Brother   . CAD Sister    History  Substance Use Topics  .  Smoking status: Never Smoker   . Smokeless tobacco: Not on file  . Alcohol Use: No   OB History    Gravida Para Term Preterm AB TAB SAB Ectopic Multiple Living   _0 Review of Systems  Cardiovascular: Positive for chest pain.  Musculoskeletal: Positive for back pain.  All other systems reviewed and are negative.     Allergies  Glimepiride and Sulfa antibiotics  Home Medications   Prior to Admission medications   Medication Sig Start Date End Date Taking? Authorizing Provider  ALPRAZolam Duanne Moron) 0.5 MG tablet Take 1 tablet (0.5 mg total) by mouth daily as needed for anxiety. 10/26/14   Bonnielee Haff, MD  amLODipine (NORVASC) 5 MG tablet Take 1 tablet (5 mg total) by mouth daily. 07/17/14   Lance Bosch, NP  aspirin EC 81 MG tablet Take 81 mg by mouth every morning.    Historical Provider, MD  clopidogrel (PLAVIX) 75 MG tablet Take 1 tablet (75 mg total) by mouth daily with breakfast. 05/16/14   Candee Furbish, MD  escitalopram (LEXAPRO) 10 MG tablet Take 1 tablet (10 mg total) by mouth daily. 10/26/14   Bonnielee Haff, MD  gabapentin (NEURONTIN) 100 MG capsule Take 100 mg by  mouth daily as needed (pain).    Historical Provider, MD  gabapentin (NEURONTIN) 300 MG capsule Take 1 capsule (300 mg total) by mouth 3 (three) times daily. Patient not taking: Reported on 10/23/2014 07/19/14   Lance Bosch, NP  glimepiride (AMARYL) 2 MG tablet Take 1 tablet (2 mg total) by mouth 2 (two) times daily. 07/17/14   Lance Bosch, NP  glucose blood test strip Check blood sugar before each meal and at bedtime total 4 times per day 07/19/14   Lance Bosch, NP  glucose monitoring kit (FREESTYLE) monitoring kit Test blood sugars 3 times per day as prescribed 07/31/14   Lance Bosch, NP  hydrOXYzine (ATARAX/VISTARIL) 10 MG tablet Take 1 tablet (10 mg total) by mouth 3 (three) times daily as needed. Patient taking differently: Take 10 mg by mouth 3 (three) times daily as needed for itching  or anxiety.  07/17/14   Lance Bosch, NP  isosorbide mononitrate (IMDUR) 30 MG 24 hr tablet TAKE 1 TABLET (30 MG TOTAL) BY MOUTH EVERY MORNING. 07/26/14   Lance Bosch, NP  Lancets (FREESTYLE) lancets Check blood sugar four times per day. Before each meal and at bedtime 07/19/14   Lance Bosch, NP  levothyroxine (SYNTHROID, LEVOTHROID) 25 MCG tablet TAKE 1 TABLET (25 MCG TOTAL) BY MOUTH DAILY BEFORE BREAKFAST. 07/26/14   Lance Bosch, NP  metoprolol (LOPRESSOR) 50 MG tablet Take 1 tablet (50 mg total) by mouth 2 (two) times daily. For blood pressure 05/09/14   Candee Furbish, MD  metoprolol (LOPRESSOR) 50 MG tablet TAKE 1 TABLET (50 MG TOTAL) BY MOUTH 2 (TWO) TIMES DAILY. FOR BLOOD PRESSURE 11/05/14   Lance Bosch, NP  nitroGLYCERIN (NITROSTAT) 0.4 MG SL tablet Place 1 tablet (0.4 mg total) under the tongue every 5 (five) minutes as needed for chest pain. 03/14/14   Lance Bosch, NP  omeprazole (PRILOSEC) 20 MG capsule Take 1 capsule (20 mg total) by mouth daily. For acid reflux Patient taking differently: Take 20 mg by mouth daily as needed (acid reflux). For acid reflux 04/13/14   Lance Bosch, NP  pravastatin (PRAVACHOL) 40 MG tablet Take 1 tablet (40 mg total) by mouth daily. 10/26/14   Bonnielee Haff, MD  triamcinolone cream (KENALOG) 0.1 % Apply 1 application topically 2 (two) times daily. Do not apply to face Patient taking differently: Apply 1 application topically 2 (two) times daily as needed (rash/itching). Do not apply to face 04/13/14   Lance Bosch, NP  vitamin E (VITAMIN E) 400 UNIT capsule Take 400 Units by mouth every morning.    Historical Provider, MD   Triage Vitals: BP 162/100 mmHg  Pulse 61  Temp(Src) 98.6 F (37 C)  Resp 16  SpO2 99%  Physical Exam  Constitutional: She is oriented to person, place, and time. She appears well-developed and well-nourished. No distress.  HENT:  Head: Normocephalic and atraumatic.  Eyes: Conjunctivae and EOM are normal.  Neck: Neck  supple. No tracheal deviation present.  Cardiovascular: Normal rate.   Pulmonary/Chest: Effort normal. No respiratory distress.  Abdominal: Soft.  Musculoskeletal: Normal range of motion. She exhibits no edema.  No peripheral edema.  Neurological: She is alert and oriented to person, place, and time.  Skin: Skin is warm and dry.  Psychiatric: She has a normal mood and affect. Her behavior is normal.  Nursing note and vitals reviewed.   ED Course  Procedures (including critical care time)  DIAGNOSTIC STUDIES: Oxygen Saturation  is 99% on room air, normal by my interpretation.    COORDINATION OF CARE: At 0114 Discussed treatment plan with patient which includes labs. Patient agrees.   Labs Review Labs Reviewed  CBC WITH DIFFERENTIAL - Abnormal; Notable for the following:    WBC 11.3 (*)    Neutro Abs 8.5 (*)    All other components within normal limits  COMPREHENSIVE METABOLIC PANEL - Abnormal; Notable for the following:    Glucose, Bld 232 (*)    Total Protein 5.9 (*)    GFR calc non Af Amer 86 (*)    All other components within normal limits  I-STAT TROPOININ, ED    Imaging Review Dg Chest 2 View  11/17/2014   CLINICAL DATA:  Chest pain, hypertension  EXAM: CHEST  2 VIEW  COMPARISON:  Radiograph 10/23/2014  FINDINGS: Normal cardiac silhouette. No effusion, infiltrate, pneumothorax. No acute osseous abnormality.  IMPRESSION: No acute cardiopulmonary process.   Electronically Signed   By: Suzy Bouchard M.D.   On: 11/17/2014 01:53     EKG Interpretation None      MDM   Final diagnoses:  None   Shannon [name] is a 64 y.o. female here with chest pain. Consider ACS or unstable angina. Pain free now. Will get labs, trop. Will likely admit for observation.   4:08 AM Trop neg x 1. Pain free. Will admit to tele.   I personally performed the services described in this documentation, which was scribed in my presence. The recorded information has been reviewed and is  accurate.    Wandra Arthurs, MD 11/17/14 [phone]

## 2014-11-17 NOTE — ED Notes (Signed)
Pt c/o nausea.  

## 2014-11-17 NOTE — Progress Notes (Signed)
    Primary cardiologist: Dr. Donato SchultzMark [name]  Seen for followup: Chest pain  Subjective:    Chest pain has improved.  Objective:   Temp:  [97.5 F (36.4 C)-98.6 F (37 C)] 97.5 F (36.4 C) (01/09 0617) Pulse Rate:  [50-61] 51 (01/09 0617) Resp:  [15-22] 17 (01/09 0400) BP: (139-164)/(64-100) 139/77 mmHg (01/09 0617) SpO2:  [97 %-99 %] 98 % (01/09 0617) Weight:  [154 lb 8.7 oz (70.1 kg)] 154 lb 8.7 oz (70.1 kg) (01/09 0617) Last BM Date: 11/16/14  Filed Weights   11/17/14 0617  Weight: 154 lb 8.7 oz (70.1 kg)    Intake/Output Summary (Last 24 hours) at 11/17/14 1353 Last data filed at 11/17/14 1300  Gross per 24 hour  Intake    240 ml  Output      0 ml  Net    240 ml    Telemetry: Sinus rhythm.  Exam:  General: Appears comfortable at rest.  Lungs: Clear, nonlabored.  Cardiac: RRR, no gallop.  Extremities: No pitting edema.   Lab Results:  Basic Metabolic Panel:  Recent Labs Lab 11/17/14 0123  NA 136  K 4.0  CL 103  CO2 21  GLUCOSE 232*  BUN 8  CREATININE 0.79  CALCIUM 9.0    Liver Function Tests:  Recent Labs Lab 11/17/14 0123  AST 26  ALT 25  ALKPHOS 76  BILITOT 0.5  PROT 5.9*  ALBUMIN 3.7    CBC:  Recent Labs Lab 11/17/14 0123  WBC 11.3*  HGB 13.3  HCT 39.6  MCV 84.1  PLT 188    Cardiac Enzymes: POC troponin I negative  ECG: Sinus rhythm with PACs, no acute ST segment changes.  Imaging: EXAM: CHEST 2 VIEW  COMPARISON: Radiograph 10/23/2014  FINDINGS: Normal cardiac silhouette. No effusion, infiltrate, pneumothorax. No acute osseous abnormality.  IMPRESSION: No acute cardiopulmonary process.   Medications:   Scheduled Medications: . amLODipine  5 mg Oral Daily  . aspirin EC  81 mg Oral q morning - 10a  . clopidogrel  75 mg Oral Q breakfast  . enoxaparin (LOVENOX) injection  40 mg Subcutaneous Daily  . escitalopram  10 mg Oral Daily  . isosorbide mononitrate  30 mg Oral Daily  . levothyroxine  25 mcg  Oral QAC breakfast  . metoprolol  50 mg Oral BID  . pantoprazole  40 mg Oral Daily  . pravastatin  40 mg Oral q1800  . vitamin E  400 Units Oral q morning - 10a      PRN Medications:  acetaminophen, ALPRAZolam, gabapentin, gi cocktail, hydrOXYzine, nitroGLYCERIN, ondansetron (ZOFRAN) IV   Assessment:   1. Recurrent, atypical chest pain.  2. CAD status post DES to the mid LAD in May 2011. Myoview in July 2015 was negative for ischemia.  3. Essential hypertension.  4. Type 2 diabetes mellitus.  5. History of stroke in December 2015 with residual left-sided weakness and visual disturbance.   Plan/Discussion:    Would cycle full set of cardiac markers. If negative, anticipate discharge home tomorrow. We will continue PPI in case there is reflux leading to her symptoms. No other change in cardiac regimen for now.   Jonelle SidleSamuel G. McDowell, M.D., F.A.C.C.

## 2014-11-17 NOTE — ED Notes (Signed)
Pt c/o central chest pain radiating into back. Reports pain is similar to previous MI. Pt had a stroke on 12/12 with L sided deficits and blurred vision. Pt c/o nausea with pain. EMS gave nitro x 1 with relief.

## 2014-11-17 NOTE — H&P (Signed)
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  Her chest pain began on the evening of presentation, 5 hours before she came to the ED.  It was midline, burning pain with radiation to the back and R arm.  The pain was 8/10 and did not improve after nitroglycerin.  She noted mild nausea but denied SOB, diaphoresis or emesis.  She came to the ED for further revaluation.  Currently her CP is 1/10.    [name] had a negative pharmacologic Myoview 05/2014.  Her symptoms then were more chest pressure compared with chest burning now.  [name] had a stroke 10/2014 and has residual L LE weakness and visual disturbance.  Since then she has been feeling very tired and weak.  She works with PT.    Review of Systems:     Cardiac Review of Systems: {Y] = yes [ ]  = no  Chest Pain [  x  ]  Resting SOB [   ] Exertional SOB  [  ]  Orthopnea [  ]   Pedal Edema [   ]    Palpitations [ x ] Syncope  [  ]   Presyncope [   ]  General Review of Systems: [Y] = yes [  ]=no Constitional: recent weight change [  ]; anorexia [  ]; fatigue [  ]; nausea [  ]; night sweats [  ]; fever [  ]; or chills [  ];                                                                                                                                          Dental: poor dentition[  ];   Eye : blurred vision [  ]; diplopia [   ]; vision changes [  ];  Amaurosis fugax[  ]; Resp: cough [  ];  wheezing[  ];  hemoptysis[  ]; shortness of breath[  ]; paroxysmal nocturnal dyspnea[  ]; dyspnea on exertion[  ]; or orthopnea[  ];  GI:  gallstones[  ], vomiting[  ];  dysphagia[  ]; melena[  ];  hematochezia [  ]; heartburn[  x];   Hx of  Colonoscopy[  ]; GU: kidney stones [  ]; hematuria[  ];   dysuria [  ];  nocturia[  ];  history of     obstruction [  ];                 Skin: rash, swelling[  ];, hair loss[  ];  peripheral edema[  ];  or itching[  ]; Musculosketetal: myalgias[  ];  joint swelling[   ];  joint erythema[  ];  joint pain[  ];  back pain[  ];  Heme/Lymph: bruising[  ];  bleeding[  ];  anemia[  ];  Neuro: TIA[  ];  headaches[  ];  stroke[  ];  vertigo[  ];  seizures[  ];   paresthesias[  ];  difficulty walking[  ];  Psych:depression[  ]; anxiety[  ];  Endocrine: diabetes[  ];  thyroid dysfunction[ x ];  Immunizations: Flu [  ]; Pneumococcal[  ];  Other:  Past Medical History  Diagnosis Date  . Hypertension   . Coronary artery disease   . Diabetes mellitus without complication   . Arthritis   . Fibromyalgia   . Irritable bowel   . Anxiety   . Depression   . Fibromyalgia 1995  . Acute MI      (Not in a hospital admission)   Allergies  Allergen Reactions  . Glimepiride     DIARRHEA GIVES PROBLEMS WITH IBS  . Sulfa Antibiotics Other (See Comments)    Childhood allergy    History   Social History  . Marital Status: Married    Spouse Name: N/A    Number of Children: N/A  . Years of Education: N/A   Occupational History  . Not on file.   Social History Main Topics  . Smoking status: Never Smoker   . Smokeless tobacco: Not on file  . Alcohol Use: No  . Drug Use: No  . Sexual Activity: Not on file   Other Topics Concern  . Not on file   Social History Narrative    Family History  Problem Relation Age of Onset  . Cancer Mother   . Diabetes Mother   . Hypertension Mother   . Heart failure Mother   . Cancer Father   . Diabetes Father   . Hypertension Father   . Heart failure Father   . CAD Father   . CAD Mother   . CAD Brother   . CAD Sister     PHYSICAL EXAM: Filed Vitals:   11/17/14 0230  BP: 147/93  Pulse: 58  Temp:   Resp: 22   General:  Chronically ill-appearing. No respiratory difficulty HEENT: normal Neck: supple. no JVD. Carotids 2+ bilat; no bruits. No lymphadenopathy or thryomegaly appreciated. Cor: PMI nondisplaced. Regular rate & rhythm. No rubs, gallops or murmurs. Lungs: clear Abdomen: soft, nontender,  nondistended. No hepatosplenomegaly. No bruits or masses. Good bowel sounds. Extremities: no cyanosis, clubbing, rash, edema Neuro: alert & oriented x 3, cranial nerves grossly intact. moves all 4 extremities w/o difficulty. Affect pleasant.  ECG: Sinus brady at 58bpm.  1 PAC.  Results for orders placed or performed during the hospital encounter of 11/17/14 (from the past 24 hour(s))  CBC with Differential     Status: Abnormal   Collection Time: 11/17/14  1:23 AM  Result Value Ref Range   WBC 11.3 (H) 4.0 - 10.5 K/uL   RBC 4.71 3.87 - 5.11 MIL/uL   Hemoglobin 13.3 12.0 - 15.0 g/dL   HCT 16.1 09.6 - 04.5 %   MCV 84.1 78.0 - 100.0 fL   MCH 28.2 26.0 - 34.0 pg   MCHC 33.6 30.0 - 36.0 g/dL   RDW 40.9 81.1 - 91.4 %   Platelets 188 150 - 400 K/uL   Neutrophils Relative % 76 43 - 77 %   Neutro Abs 8.5 (H) 1.7 - 7.7 K/uL   Lymphocytes Relative 14 12 - 46 %   Lymphs Abs 1.6 0.7 - 4.0 K/uL   Monocytes Relative 8 3 - 12 %   Monocytes Absolute 0.9 0.1 - 1.0 K/uL   Eosinophils Relative 2 0 - 5 %  Eosinophils Absolute 0.3 0.0 - 0.7 K/uL   Basophils Relative 0 0 - 1 %   Basophils Absolute 0.0 0.0 - 0.1 K/uL  Comprehensive metabolic panel     Status: Abnormal   Collection Time: 11/17/14  1:23 AM  Result Value Ref Range   Sodium 136 135 - 145 mmol/L   Potassium 4.0 3.5 - 5.1 mmol/L   Chloride 103 96 - 112 mEq/L   CO2 21 19 - 32 mmol/L   Glucose, Bld 232 (H) 70 - 99 mg/dL   BUN 8 6 - 23 mg/dL   Creatinine, Ser 1.61 0.50 - 1.10 mg/dL   Calcium 9.0 8.4 - 09.6 mg/dL   Total Protein 5.9 (L) 6.0 - 8.3 g/dL   Albumin 3.7 3.5 - 5.2 g/dL   AST 26 0 - 37 U/L   ALT 25 0 - 35 U/L   Alkaline Phosphatase 76 39 - 117 U/L   Total Bilirubin 0.5 0.3 - 1.2 mg/dL   GFR calc non Af Amer 86 (L) >90 mL/min   GFR calc Af Amer >90 >90 mL/min   Anion gap 12 5 - 15  I-stat troponin, ED     Status: None   Collection Time: 11/17/14  1:33 AM  Result Value Ref Range   Troponin i, poc 0.03 0.00 - 0.08 ng/mL    Comment 3           Dg Chest 2 View  11/17/2014   CLINICAL DATA:  Chest pain, hypertension  EXAM: CHEST  2 VIEW  COMPARISON:  Radiograph 10/23/2014  FINDINGS: Normal cardiac silhouette. No effusion, infiltrate, pneumothorax. No acute osseous abnormality.  IMPRESSION: No acute cardiopulmonary process.   Electronically Signed   By: Genevive Bi M.D.   On: 11/17/2014 01:53   TTE 10/25/14: EF 50-55%, moderate LVH with grade 2 diastolic dysfunction.   Pharmacologic Myoview 05/2014: EF 59%.  Normal rest and stress perfusion imaging.   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  Her chest pain began on the evening of presentation, 5 hours before she came to the ED.     PLAN/DISCUSSION: # Atypical cest pain/CAD: Ms. Lutes's chest pain is atypical (lasted for hours, burning, non-exertional).  Also, the likelihood of ACS given a negative Myoview <6 months ago is low.  She does have a history of coronary disease, so will r/o for MI.  Will also treat for acid reflux.  Consider repeat stress if r/o is negative and symptoms don't improve with GI cocktail.  She has some chest well tenderness to palpation, but this sensation is different that the pain that brought her in, making costochondritis unlikely. - ASA , plavix  daily - Continue home pravastatin.  Would favor a more cardioprotective statin if she can afford and tolerate it - Continue home beta blocker - Continue PPI and try GI cocktail - Continue imdur and amlodipine  # DM: Holding home oral meds. - AC/HS Accuchecks + SSI  # HTN: Consider adding ACE-I given her CAD and DM  # Hypothyroidism: Continue synthroid  # Code: Full

## 2014-11-17 NOTE — ED Notes (Signed)
Pt to ED via [facility] from home c/o chest pain starting at 8pm. Pain described as burning and radiating into back. Pt took 324mg  ASA and 4 nitros at home without relief. States the nitro at home may be out of date. EMS gave nitro x1 with relief. Hx of MI with 1 stent in 2010. States pain is similar to past pain with MI

## 2014-11-17 NOTE — Progress Notes (Signed)
CRITICAL VALUE ALERT  Critical value received:  Troponin 1.06  Date of notification:  11/17/2014  Time of notification:  8:40 PM  Critical value read back:Yes  Nurse who received alert:  Toya SmothersLy Haig Gerardo, RN  MD notified (1st page):  Dr. Duke Salviaandolph  Time of first page:  9:03 PM  MD notified (2nd page):  Time of second page:  Responding MD:  Dr. Duke Salviaandolph  Time MD responded:  9:15 PM

## 2014-11-17 NOTE — Progress Notes (Signed)
ANTICOAGULATION CONSULT NOTE - Initial Consult  Pharmacy Consult for Heparin Indication: chest pain/ACS  Allergies  Allergen Reactions  . Glimepiride     DIARRHEA GIVES PROBLEMS WITH IBS  . Sulfa Antibiotics Other (See Comments)    Childhood allergy   Patient Measurements: Height: 5\' 4"  (162.6 cm) Weight: 154 lb 8.7 oz (70.1 kg) IBW/kg (Calculated) : 54.7 Heparin Dosing Weight: 60kg  Vital Signs: Temp: 98.4 F (36.9 C) (01/09 2043) Temp Source: Oral (01/09 2043) BP: 99/48 mmHg (01/09 2043) Pulse Rate: 52 (01/09 2043)  Labs:  Recent Labs  11/17/14 0123 11/17/14 1930  HGB 13.3  --   HCT 39.6  --   PLT 188  --   CREATININE 0.79  --   TROPONINI  --  1.06*   Estimated Creatinine Clearance: 67.4 mL/min (by C-G formula based on Cr of 0.79).  Medical History: Past Medical History  Diagnosis Date  . Hypertension   . Coronary artery disease   . Diabetes mellitus without complication   . Arthritis   . Fibromyalgia   . Irritable bowel   . Anxiety   . Depression   . Fibromyalgia 1995  . Acute MI    Assessment: 64yo female with multiple medical problems presents with chest pain with radiation.  We have been asked to initiate IV heparin while her cardiac work up is ongoing.  She is on ASA/Plavix PTA, but no anticoagulation.  Her troponin is elevated at 1.06.  She has a normal CBC and her H/H and platelets are stable.  No current bleeding complications.  She is over-weight and we will use an adjusted body weight for dosing (60KG).  Goal of Therapy:  Heparin level 0.3-0.7 units/ml Monitor platelets by anticoagulation protocol: Yes   Plan:  - Heparin 4000 units IV bolus - Heparin infusion at 1000 units/hr - Check Heparin level with AM labs - Monitor for s/s of bleeding  Nadara MustardNita Donyale Falcon, PharmD., MS Clinical Pharmacist Pager:  [phone] Thank you for allowing pharmacy to be part of this patients care team. 11/17/2014,9:25 PM

## 2014-11-18 ENCOUNTER — Inpatient Hospital Stay (HOSPITAL_COMMUNITY): Payer: Medicare Other

## 2014-11-18 DIAGNOSIS — M79642 Pain in left hand: Secondary | ICD-10-CM

## 2014-11-18 DIAGNOSIS — I214 Non-ST elevation (NSTEMI) myocardial infarction: Principal | ICD-10-CM

## 2014-11-18 DIAGNOSIS — Z8673 Personal history of transient ischemic attack (TIA), and cerebral infarction without residual deficits: Secondary | ICD-10-CM

## 2014-11-18 DIAGNOSIS — I639 Cerebral infarction, unspecified: Secondary | ICD-10-CM

## 2014-11-18 DIAGNOSIS — I25110 Atherosclerotic heart disease of native coronary artery with unstable angina pectoris: Secondary | ICD-10-CM

## 2014-11-18 LAB — LIPID PANEL
Cholesterol: 133 mg/dL (ref 0–200)
HDL: 39 mg/dL — ABNORMAL LOW (ref >39)
LDL Cholesterol: 77 mg/dL (ref 0–99)
TRIGLYCERIDES: 85 mg/dL (ref <150)
Total CHOL/HDL Ratio: 3.4 RATIO
VLDL: 17 mg/dL (ref 0–40)

## 2014-11-18 LAB — BASIC METABOLIC PANEL
Anion gap: 8 (ref 5–15)
BUN: 10 mg/dL (ref 6–23)
CO2: 22 mmol/L (ref 19–32)
Calcium: 8.9 mg/dL (ref 8.4–10.5)
Chloride: 106 mEq/L (ref 96–112)
Creatinine, Ser: 0.88 mg/dL (ref 0.50–1.10)
GFR calc non Af Amer: 67 mL/min — ABNORMAL LOW (ref >90)
GFR, EST AFRICAN AMERICAN: 78 mL/min — AB (ref >90)
Glucose, Bld: 166 mg/dL — ABNORMAL HIGH (ref 70–99)
Potassium: 4.2 mmol/L (ref 3.5–5.1)
Sodium: 136 mmol/L (ref 135–145)

## 2014-11-18 LAB — CBC
HEMATOCRIT: 38 % (ref 36.0–46.0)
Hemoglobin: 12.7 g/dL (ref 12.0–15.0)
MCH: 28.1 pg (ref 26.0–34.0)
MCHC: 33.4 g/dL (ref 30.0–36.0)
MCV: 84.1 fL (ref 78.0–100.0)
PLATELETS: 167 10*3/uL (ref 150–400)
RBC: 4.52 MIL/uL (ref 3.87–5.11)
RDW: 13.5 % (ref 11.5–15.5)
WBC: 8.9 10*3/uL (ref 4.0–10.5)

## 2014-11-18 LAB — TROPONIN I: Troponin I: 0.83 ng/mL (ref <0.031)

## 2014-11-18 LAB — HEPARIN LEVEL (UNFRACTIONATED)
HEPARIN UNFRACTIONATED: 0.81 [IU]/mL — AB (ref 0.30–0.70)
HEPARIN UNFRACTIONATED: 0.41 [IU]/mL (ref 0.30–0.70)
Heparin Unfractionated: 1.03 IU/mL — ABNORMAL HIGH (ref 0.30–0.70)

## 2014-11-18 MED ORDER — CALCIUM CARBONATE ANTACID 500 MG PO CHEW
1.0000 | CHEWABLE_TABLET | Freq: Once | ORAL | Status: AC
  Administered 2014-11-18: 200 mg via ORAL
  Filled 2014-11-18: qty 1

## 2014-11-18 NOTE — Progress Notes (Signed)
Neuro plans for repeat imaging tonight. Will hold off on arranging cath until further decisions are made regarding neurologic status. Dayna Carda PA-C

## 2014-11-18 NOTE — Progress Notes (Signed)
ANTICOAGULATION CONSULT NOTE - Follow Up Consult  Pharmacy Consult for Heparin Indication: chest pain/ACS  Allergies  Allergen Reactions  . Glimepiride     DIARRHEA GIVES PROBLEMS WITH IBS  . Sulfa Antibiotics Other (See Comments)    Childhood allergy    Patient Measurements: Height: 5\' 4"  (162.6 cm) Weight: 154 lb 8.7 oz (70.1 kg) IBW/kg (Calculated) : 54.7 Heparin Dosing Weight: 70 kg  Vital Signs: Temp: 98.1 F (36.7 C) (01/10 0338) Temp Source: Oral (01/10 0338) BP: 129/71 mmHg (01/10 0415) Pulse Rate: 55 (01/10 0338)  Labs:  Recent Labs  11/17/14 0123 11/17/14 1930 11/18/14 0020 11/18/14 0722  HGB 13.3  --  12.7  --   HCT 39.6  --  38.0  --   PLT 188  --  167  --   HEPARINUNFRC  --   --  1.03* 0.81*  CREATININE 0.79  --  0.88  --   TROPONINI  --  1.06* 0.83*  --     Estimated Creatinine Clearance: 61.3 mL/min (by C-G formula based on Cr of 0.88).   Medications:  Infusions:  . heparin 1,000 Units/hr (11/17/14 2215)    Assessment: 64 year old female on anticoagulation with heparin for chest pain.  Her heparin level is supratherapeutic.  Her CBC is stable and no bleeding is noted.  Goal of Therapy:  Heparin level 0.3-0.7 units/ml Monitor platelets by anticoagulation protocol: Yes   Plan:  Decrease Heparin to 850 units/hr Check heparin level in 6 hours  Estella HuskMichelle Alona Danford, Pharm.D., BCPS, AAHIVP Clinical Pharmacist Phone: [phone] or [phone] 11/18/2014, 9:37 AM

## 2014-11-18 NOTE — Consult Note (Signed)
Stroke Consult    Chief Complaint: left sided numbness and worsening vision   HPI: Shannon [name] is an 64 y.o. female history of recent CVA in December, HTN, CAD, DM admitted with chest pain. During her admission she noted worsening vision (described as a more pronounced left VF cut and numbness on her left side). Due to chest pain and question of NSTEMI cardiology wanted neurology input in regards to safety of cardiac catherization.   Patient had an acute right PCA infarct in December. Notes having a left VF cut after that stroke but feels it has gotten much worse. She also now notes numbness on her left side. MRA at that time showed "markedly diminished flow or occlusion in right PCA...narrowing and irregularity in the proximal anterior and middle cerebral arteries".    Past Medical History  Diagnosis Date  . Hypertension   . Coronary artery disease   . Diabetes mellitus without complication   . Arthritis   . Fibromyalgia   . Irritable bowel   . Anxiety   . Depression   . Fibromyalgia 1995  . Acute MI     Past Surgical History  Procedure Laterality Date  . Coronary stent placement    . Cholecystectomy      Family History  Problem Relation Age of Onset  . Cancer Mother   . Diabetes Mother   . Hypertension Mother   . Heart failure Mother   . Cancer Father   . Diabetes Father   . Hypertension Father   . Heart failure Father   . CAD Father   . CAD Mother   . CAD Brother   . CAD Sister    Social History:  reports that she has never smoked. She does not have any smokeless tobacco history on file. She reports that she does not drink alcohol or use illicit drugs.  Allergies:  Allergies  Allergen Reactions  . Glimepiride     DIARRHEA GIVES PROBLEMS WITH IBS  . Sulfa Antibiotics Other (See Comments)    Childhood allergy    Medications Prior to Admission  Medication Sig Dispense Refill  . ALPRAZolam (XANAX) 0.5 MG tablet Take 1 tablet (0.5 mg total) by mouth daily  as needed for anxiety. 15 tablet 0  . amLODipine (NORVASC) 5 MG tablet Take 1 tablet (5 mg total) by mouth daily. 30 tablet 3  . aspirin EC 81 MG tablet Take 81 mg by mouth every morning.    . clopidogrel (PLAVIX) 75 MG tablet Take 1 tablet (75 mg total) by mouth daily with breakfast. 30 tablet 6  . escitalopram (LEXAPRO) 10 MG tablet Take 1 tablet (10 mg total) by mouth daily. 30 tablet 0  . gabapentin (NEURONTIN) 100 MG capsule Take 100 mg by mouth daily as needed (pain).    Marland Kitchen glimepiride (AMARYL) 2 MG tablet Take 1 tablet (2 mg total) by mouth 2 (two) times daily. 60 tablet 3  . glucose blood test strip Check blood sugar before each meal and at bedtime total 4 times per day 100 each 12  . glucose monitoring kit (FREESTYLE) monitoring kit Test blood sugars 3 times per day as prescribed 1 each 0  . hydrOXYzine (ATARAX/VISTARIL) 10 MG tablet Take 1 tablet (10 mg total) by mouth 3 (three) times daily as needed. (Patient taking differently: Take 10 mg by mouth 3 (three) times daily as needed for itching or anxiety. ) 30 tablet 3  . isosorbide mononitrate (IMDUR) 30 MG 24 hr tablet TAKE 1  TABLET (30 MG TOTAL) BY MOUTH EVERY MORNING. 30 tablet 1  . Lancets (FREESTYLE) lancets Check blood sugar four times per day. Before each meal and at bedtime 100 each 12  . levothyroxine (SYNTHROID, LEVOTHROID) 25 MCG tablet TAKE 1 TABLET (25 MCG TOTAL) BY MOUTH DAILY BEFORE BREAKFAST. 30 tablet 2  . metoprolol (LOPRESSOR) 50 MG tablet TAKE 1 TABLET (50 MG TOTAL) BY MOUTH 2 (TWO) TIMES DAILY. FOR BLOOD PRESSURE 60 tablet 0  . nitroGLYCERIN (NITROSTAT) 0.4 MG SL tablet Place 1 tablet (0.4 mg total) under the tongue every 5 (five) minutes as needed for chest pain. 25 tablet 0  . omeprazole (PRILOSEC) 20 MG capsule Take 1 capsule (20 mg total) by mouth daily. For acid reflux (Patient taking differently: Take 20 mg by mouth daily as needed (acid reflux). For acid reflux) 30 capsule 3  . pravastatin (PRAVACHOL) 40 MG tablet  Take 1 tablet (40 mg total) by mouth daily. 30 tablet 3  . triamcinolone cream (KENALOG) 0.1 % Apply 1 application topically 2 (two) times daily. Do not apply to face (Patient taking differently: Apply 1 application topically 2 (two) times daily as needed (rash/itching). Do not apply to face) 30 g 0  . vitamin E (VITAMIN E) 400 UNIT capsule Take 400 Units by mouth every morning.    . gabapentin (NEURONTIN) 300 MG capsule Take 1 capsule (300 mg total) by mouth 3 (three) times daily. (Patient not taking: Reported on 10/23/2014) 90 capsule 3  . metoprolol (LOPRESSOR) 50 MG tablet Take 1 tablet (50 mg total) by mouth 2 (two) times daily. For blood pressure (Patient not taking: Reported on 11/17/2014) 60 tablet 3    ROS: Out of a complete 14 system review, the patient complains of only the following symptoms, and all other reviewed systems are negative. + vision deficits, numbness, weakness  Prior MRI brain imaging reviewed.   Physical Examination: Filed Vitals:   11/18/14 0415  BP: 129/71  Pulse:   Temp:   Resp:    Physical Exam  Constitutional: He appears well-developed and well-nourished.  Psych: Affect appropriate to situation Eyes: No scleral injection HENT: No OP obstrucion Head: Normocephalic.  Cardiovascular: Normal rate and regular rhythm.  Respiratory: Effort normal and breath sounds normal.  GI: Soft. Bowel sounds are normal. No distension. There is no tenderness.  Skin: WDI  Neurologic Examination: Mental Status: Alert, oriented, thought content appropriate. Fluent, no aphasia. No dysarthria  Able to follow 3 step commands without difficulty. Cranial Nerves: II: unable to visualize fundi due to pupil size, noted left homonymous hemianopsia, pupils equal, round, reactive to light and accommodation III,IV, VI: ptosis not present, extra-ocular motions intact bilaterally V,VII: smile symmetric, facial light touch sensation normal bilaterally VIII: hearing normal  bilaterally IX,X: gag reflex present XI: trapezius strength/neck flexion strength normal bilaterally XII: tongue strength normal  Motor: Right : Upper extremity    Left:     Upper extremity 5/5 deltoid       5/5 deltoid 5/5 biceps      5/5 biceps  5/5 triceps      5/5 triceps 5/5 hand grip      5/5 hand grip  Lower extremity     Lower extremity 5/5 hip flexor      5/5 hip flexor 5/5 quadricep      5/5 quadriceps  5/5 hamstrings     5/5 hamstrings 5/5 plantar flexion       5/5 plantar flexion 5/5 plantar extension  5/5 plantar extension Tone and bulk:normal tone throughout; no atrophy noted Sensory: decreased LT un LUE and LLE Deep Tendon Reflexes: 2+ and symmetric throughout Plantars: Right: downgoing   Left: downgoing Cerebellar: normal finger-to-nose and heel-to-shin Gait: deferred  Laboratory Studies:   Basic Metabolic Panel:  Recent Labs Lab 11/17/14 0123 11/18/14 0020  NA 136 136  K 4.0 4.2  CL 103 106  CO2 21 22  GLUCOSE 232* 166*  BUN 8 10  CREATININE 0.79 0.88  CALCIUM 9.0 8.9    Liver Function Tests:  Recent Labs Lab 11/17/14 0123  AST 26  ALT 25  ALKPHOS 76  BILITOT 0.5  PROT 5.9*  ALBUMIN 3.7   No results for input(s): LIPASE, AMYLASE in the last 168 hours. No results for input(s): AMMONIA in the last 168 hours.  CBC:  Recent Labs Lab 11/17/14 0123 11/18/14 0020  WBC 11.3* 8.9  NEUTROABS 8.5*  --   HGB 13.3 12.7  HCT 39.6 38.0  MCV 84.1 84.1  PLT 188 167    Cardiac Enzymes:  Recent Labs Lab 11/17/14 1930 11/18/14 0020  TROPONINI 1.06* 0.83*    BNP: Invalid input(s): POCBNP  CBG:  Recent Labs Lab 11/17/14 0611  GLUCAP 153*    Microbiology: Results for orders placed or performed during the hospital encounter of 07/01/13  MRSA PCR Screening     Status: None   Collection Time: 07/01/13  5:59 AM  Result Value Ref Range Status   MRSA by PCR NEGATIVE NEGATIVE Final    Comment:        The GeneXpert MRSA Assay  (FDA approved for NASAL specimens only), is one component of a comprehensive MRSA colonization surveillance program. It is not intended to diagnose MRSA infection nor to guide or monitor treatment for MRSA infections.    Coagulation Studies: No results for input(s): LABPROT, INR in the last 72 hours.  Urinalysis: No results for input(s): COLORURINE, LABSPEC, PHURINE, GLUCOSEU, HGBUR, BILIRUBINUR, KETONESUR, PROTEINUR, UROBILINOGEN, NITRITE, LEUKOCYTESUR in the last 168 hours.  Invalid input(s): APPERANCEUR  Lipid Panel:     Component Value Date/Time   CHOL 133 11/18/2014 0020   TRIG 85 11/18/2014 0020   HDL 39* 11/18/2014 0020   CHOLHDL 3.4 11/18/2014 0020   VLDL 17 11/18/2014 0020   LDLCALC 77 11/18/2014 0020    HgbA1C:  Lab Results  Component Value Date   HGBA1C 7.9* 10/24/2014    Urine Drug Screen:     Component Value Date/Time   LABOPIA NONE DETECTED 10/24/2014 0154   COCAINSCRNUR NONE DETECTED 10/24/2014 0154   LABBENZ NONE DETECTED 10/24/2014 0154   AMPHETMU NONE DETECTED 10/24/2014 0154   THCU NONE DETECTED 10/24/2014 0154   LABBARB NONE DETECTED 10/24/2014 0154    Alcohol Level: No results for input(s): ETH in the last 168 hours.  Other results:  Imaging: Dg Chest 2 View  11/17/2014   CLINICAL DATA:  Chest pain, hypertension  EXAM: CHEST  2 VIEW  COMPARISON:  Radiograph 10/23/2014  FINDINGS: Normal cardiac silhouette. No effusion, infiltrate, pneumothorax. No acute osseous abnormality.  IMPRESSION: No acute cardiopulmonary process.   Electronically Signed   By: Suzy Bouchard M.D.   On: 11/17/2014 01:53    Assessment: 64 y.o. female with history of HTN, CAD, DM and prior PCA infarct in December admitted with chest pain. Patient noted to have worsening of visual deficits and subject sensory loss on left side. Cardiology plans for possible cardiac catheterization but question safety due to recent infarct and question  of new infarct.  Prior MRA shows  severe stenosis in right PCA, ACA and MCA vessels putting her at high risk of future infarcts. Will get MRI brain to rule out new infarct. Will discuss benefit/risk of cardiac catheterization with cardiology after MRI completed. Continue ASA and Plavix.      Jim Like, DO [facility] [phone]  If 7pm- 7am, please page neurology on call as listed in [facility]. 11/18/2014, 1:59 PM

## 2014-11-18 NOTE — Progress Notes (Signed)
Pt. Complained of right chest pain radiating down her right arm and back, rated her pain 8/10. Gave first nitro at 4:03 AM, reassessed at 4:08AM pt. Rated her pain at a 3 or 4 out of 10, gave second nitro patient rated pain at a 1/10 with relief. BP 129/71, HR 52, NSR on the monitor. Will continue to monitor.

## 2014-11-18 NOTE — Progress Notes (Signed)
ANTICOAGULATION CONSULT NOTE - Follow Up Consult  Pharmacy Consult for Heparin  Indication: chest pain/ACS   Labs:  Recent Labs  11/17/14 0123 11/17/14 1930 11/18/14 0020  HGB 13.3  --  12.7  HCT 39.6  --  38.0  PLT 188  --  167  HEPARINUNFRC  --   --  1.03*  CREATININE 0.79  --  0.88  TROPONINI  --  1.06* 0.83*    Assessment: Inaccurate heparin level of 1.03, this was drawn only 2 hours after bolus given and drip started. Would expect level to be high at that point.   Goal of Therapy:  Heparin level 0.3-0.7 units/ml Monitor platelets by anticoagulation protocol: Yes   Plan:  -Re-check HL at 0600  Abran DukeLedford, Kody Vigil 11/18/2014,2:11 AM

## 2014-11-18 NOTE — Progress Notes (Signed)
First troponin level 1.06, MD notified. Orders given to start Heparin infusion & do a STAT EKG; orders followed through. Pt. Is currently resting, no signs of SOB or chest pain, VSS, Sinus brady on tele HR in the 50's. RN will continue to monitor patient.

## 2014-11-18 NOTE — Progress Notes (Signed)
Primary cardiologist: Dr. Donato Schultz  Seen for followup: Chest pain  Subjective:    Interval progress notes reviewed, patient with recurring chest pain.  Objective:   Temp:  [98.1 F (36.7 C)-98.4 F (36.9 C)] 98.1 F (36.7 C) (01/10 0338) Pulse Rate:  [52-55] 55 (01/10 0338) Resp:  [19-20] 19 (01/10 0338) BP: (99-132)/(48-78) 129/71 mmHg (01/10 0415) SpO2:  [97 %-98 %] 98 % (01/10 0415) Last BM Date: 11/17/14  Filed Weights   11/17/14 0617  Weight: 154 lb 8.7 oz (70.1 kg)    Intake/Output Summary (Last 24 hours) at 11/18/14 1215 Last data filed at 11/18/14 0815  Gross per 24 hour  Intake    240 ml  Output    400 ml  Net   -160 ml    Telemetry: Sinus rhythm.  Exam:  General: No distress.  Lungs: Clear, nonlabored.  Cardiac: RRR, no gallop.  Abdomen: NABS.  Extremities: No pitting edema.  Lab Results:  Basic Metabolic Panel:  Recent Labs Lab 11/17/14 0123 11/18/14 0020  NA 136 136  K 4.0 4.2  CL 103 106  CO2 21 22  GLUCOSE 232* 166*  BUN 8 10  CREATININE 0.79 0.88  CALCIUM 9.0 8.9    Liver Function Tests:  Recent Labs Lab 11/17/14 0123  AST 26  ALT 25  ALKPHOS 76  BILITOT 0.5  PROT 5.9*  ALBUMIN 3.7    CBC:  Recent Labs Lab 11/17/14 0123 11/18/14 0020  WBC 11.3* 8.9  HGB 13.3 12.7  HCT 39.6 38.0  MCV 84.1 84.1  PLT 188 167    Cardiac Enzymes:  Recent Labs Lab 11/17/14 1930 11/18/14 0020  TROPONINI 1.06* 0.83*    ECG: Sinus rhythm with nonspecific ST-T changes.   Medications:   Scheduled Medications: . amLODipine  5 mg Oral Daily  . aspirin EC  81 mg Oral q morning - 10a  . clopidogrel  75 mg Oral Q breakfast  . escitalopram  10 mg Oral Daily  . isosorbide mononitrate  30 mg Oral Daily  . levothyroxine  25 mcg Oral QAC breakfast  . metoprolol  50 mg Oral BID  . pantoprazole  40 mg Oral Daily  . pravastatin  40 mg Oral q1800  . vitamin E  400 Units Oral q morning - 10a    Infusions: . heparin 850  Units/hr (11/18/14 0952)    PRN Medications: acetaminophen, ALPRAZolam, gabapentin, gi cocktail, hydrOXYzine, nitroGLYCERIN, ondansetron (ZOFRAN) IV   Assessment:   1. Presentation with atypical chest pain, however subsequent cardiac markers have trended abnormally consistent with NSTEMI. ECG shows nonspecific ST-T changes. She is currently on heparin.  2. CAD status post DES to the mid LAD in May 2011. Myoview in July 2015 was negative for ischemia.  3. Essential hypertension.  4. Type 2 diabetes mellitus.  5. 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 She has been on aspirin and Plavix. Not obviously embolic. Dopplers showed only 1-39% bilateral ICA disease. She has residual left-sided weakness mainly in her leg, also reports intermittently progressive visual symptoms described as "dimming." This is been going on over the last few weeks.  6. Reports left hand pain, states that she felt a "pop" on the lateral aspect, has been using some exercise bands at home. Reports worsening pain over the last week. Plain films will be obtained.   Plan/Discussion:    Would generally plan a heart catheterization at this time in light of  abnormal cardiac markers with recurring chest pain symptoms, although somewhat atypical in description. Since she had a recent acute stroke in mid December as outlined above[facility] particularly with continued neurological symptoms to some degree. Question is whether it would be safe for us to proceed with a heart catheterization in this close proximity to an acute stroke. No change in current medical regimen.   Jonelle SidleSamuel G. Carl Butner, M.D., F.A.C.C.

## 2014-11-18 NOTE — Progress Notes (Signed)
ANTICOAGULATION CONSULT NOTE - Follow Up Consult  Pharmacy Consult for Heparin Indication: chest pain/ACS  Allergies  Allergen Reactions  . Glimepiride     DIARRHEA GIVES PROBLEMS WITH IBS  . Sulfa Antibiotics Other (See Comments)    Childhood allergy    Patient Measurements: Height: 5\' 4"  (162.6 cm) Weight: 154 lb 8.7 oz (70.1 kg) IBW/kg (Calculated) : 54.7 Heparin Dosing Weight: 70 kg  Vital Signs: Temp: 98.7 F (37.1 C) (01/10 1429) Temp Source: Oral (01/10 1429) BP: 142/64 mmHg (01/10 1429) Pulse Rate: 63 (01/10 1429)  Labs:  Recent Labs  11/17/14 0123 11/17/14 1930 11/18/14 0020 11/18/14 0722 11/18/14 1548  HGB 13.3  --  12.7  --   --   HCT 39.6  --  38.0  --   --   PLT 188  --  167  --   --   HEPARINUNFRC  --   --  1.03* 0.81* 0.41  CREATININE 0.79  --  0.88  --   --   TROPONINI  --  1.06* 0.83*  --   --     Estimated Creatinine Clearance: 61.3 mL/min (by C-G formula based on Cr of 0.88).   Medications:  Infusions:  . heparin 850 Units/hr (11/18/14 16100952)    Assessment: 64 year old female on anticoagulation with heparin for chest pain.   PM heparin level now therapeutic  Goal of Therapy:  Heparin level 0.3-0.7 units/ml Monitor platelets by anticoagulation protocol: Yes   Plan:  Continue heparin at 850 units/hr Check heparin level in 6 hours  Thank you. Okey RegalLisa Lizvette Lightsey, PharmD [phone]  11/18/2014, 4:26 PM

## 2014-11-19 DIAGNOSIS — R0789 Other chest pain: Secondary | ICD-10-CM

## 2014-11-19 DIAGNOSIS — R531 Weakness: Secondary | ICD-10-CM | POA: Insufficient documentation

## 2014-11-19 DIAGNOSIS — I6330 Cerebral infarction due to thrombosis of unspecified cerebral artery: Secondary | ICD-10-CM | POA: Insufficient documentation

## 2014-11-19 LAB — CBC
HCT: 37.3 % (ref 36.0–46.0)
Hemoglobin: 12.7 g/dL (ref 12.0–15.0)
MCH: 28.4 pg (ref 26.0–34.0)
MCHC: 34 g/dL (ref 30.0–36.0)
MCV: 83.4 fL (ref 78.0–100.0)
Platelets: 155 10*3/uL (ref 150–400)
RBC: 4.47 MIL/uL (ref 3.87–5.11)
RDW: 13.5 % (ref 11.5–15.5)
WBC: 7.6 10*3/uL (ref 4.0–10.5)

## 2014-11-19 LAB — BASIC METABOLIC PANEL
Anion gap: 8 (ref 5–15)
BUN: 8 mg/dL (ref 6–23)
CALCIUM: 8.9 mg/dL (ref 8.4–10.5)
CO2: 24 mmol/L (ref 19–32)
Chloride: 104 mEq/L (ref 96–112)
Creatinine, Ser: 0.81 mg/dL (ref 0.50–1.10)
GFR calc Af Amer: 86 mL/min — ABNORMAL LOW (ref >90)
GFR calc non Af Amer: 75 mL/min — ABNORMAL LOW (ref >90)
Glucose, Bld: 139 mg/dL — ABNORMAL HIGH (ref 70–99)
Potassium: 4.2 mmol/L (ref 3.5–5.1)
Sodium: 136 mmol/L (ref 135–145)

## 2014-11-19 LAB — HEPARIN LEVEL (UNFRACTIONATED): Heparin Unfractionated: 0.54 IU/mL (ref 0.30–0.70)

## 2014-11-19 MED ORDER — STROKE: EARLY STAGES OF RECOVERY BOOK
Freq: Once | Status: AC
  Administered 2014-11-19: 11:00:00
  Filled 2014-11-19: qty 1

## 2014-11-19 NOTE — Progress Notes (Signed)
Utilization review completed.  

## 2014-11-19 NOTE — Progress Notes (Signed)
Pt. Complained of pain in her chest mid area, non radiating, rated 6/10. Gave one nitro sublingual, pt. Rated her pain 2/10. BP 143/81 HR 73. Will continue to monitor.

## 2014-11-19 NOTE — Progress Notes (Addendum)
Primary cardiologist: Dr. Donato SchultzMark Johnay [name]  Seen for followup: Chest pain  Subjective:    Interval progress notes reviewed, patient with recurring chest pain. Currently CP free.   Objective:   Temp:  [98.7 F (37.1 C)-99.1 F (37.3 C)] 99 F (37.2 C) (01/11 1459) Pulse Rate:  [59-72] 64 (01/11 1459) Resp:  [18] 18 (01/11 1459) BP: (107-148)/(52-81) 107/52 mmHg (01/11 1459) SpO2:  [96 %-99 %] 96 % (01/11 1459) Last BM Date: 11/17/14  Filed Weights   11/17/14 0617  Weight: 154 lb 8.7 oz (70.1 kg)    Intake/Output Summary (Last 24 hours) at 11/19/14 1517 Last data filed at 11/19/14 1300  Gross per 24 hour  Intake    720 ml  Output   1800 ml  Net  -1080 ml    Telemetry: Sinus rhythm.  Exam:  General: No distress.  Lungs: Clear, nonlabored.  Cardiac: RRR, no gallop.  Abdomen: NABS.  Extremities: No pitting edema.  Lab Results:  Basic Metabolic Panel:  Recent Labs Lab 11/17/14 0123 11/18/14 0020 11/19/14 0345  NA 136 136 136  K 4.0 4.2 4.2  CL 103 106 104  CO2 21 22 24   GLUCOSE 232* 166* 139*  BUN 8 10 8   CREATININE 0.79 0.88 0.81  CALCIUM 9.0 8.9 8.9    Liver Function Tests:  Recent Labs Lab 11/17/14 0123  AST 26  ALT 25  ALKPHOS 76  BILITOT 0.5  PROT 5.9*  ALBUMIN 3.7    CBC:  Recent Labs Lab 11/17/14 0123 11/18/14 0020 11/19/14 0345  WBC 11.3* 8.9 7.6  HGB 13.3 12.7 12.7  HCT 39.6 38.0 37.3  MCV 84.1 84.1 83.4  PLT 188 167 155    Cardiac Enzymes:  Recent Labs Lab 11/17/14 1930 11/18/14 0020  TROPONINI 1.06* 0.83*    ECG: Sinus rhythm with nonspecific ST-T changes.   Medications:   Scheduled Medications: . amLODipine  5 mg Oral Daily  . aspirin EC  81 mg Oral q morning - 10a  . clopidogrel  75 mg Oral Q breakfast  . escitalopram  10 mg Oral Daily  . isosorbide mononitrate  30 mg Oral Daily  . levothyroxine  25 mcg Oral QAC breakfast  . metoprolol  50 mg Oral BID  . pantoprazole  40 mg Oral Daily  .  pravastatin  40 mg Oral q1800  . vitamin E  400 Units Oral q morning - 10a    Infusions: . heparin 850 Units/hr (11/18/14 2048)    PRN Medications: acetaminophen, ALPRAZolam, gabapentin, gi cocktail, hydrOXYzine, nitroGLYCERIN, ondansetron (ZOFRAN) IV   Assessment:   1. Presentation with atypical chest pain, however subsequent cardiac markers have trended abnormally consistent with NSTEMI. ECG shows nonspecific ST-T changes. She is currently on heparin.  2. CAD status post DES to the mid LAD in May 2011. Myoview in July 2015 was negative for ischemia.  3. Essential hypertension.  4. Type 2 diabetes mellitus.  5. 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 She has been on aspirin and Plavix. Not obviously embolic. Dopplers showed only 1-39% bilateral ICA disease. She has residual left-sided weakness mainly in her leg, also reports intermittently progressive visual symptoms described as "dimming." This is been going on over the last few weeks.  6. Reports left hand pain, states that she felt a "pop" on the lateral aspect, has been using some exercise bands at home. Reports worsening pain over the last week. Plain films unremarkable.  Plan/Discussion:    Optimally would like to pursue left heart cath, however she is not wishing to proceed at this time. She is worried about risk of further stroke. I explained to her low overall risk of periprocedural CVA and that I discussed with Dr. Pearlean Brownie of neurology who said we could proceed. She still wishes not to move forward.  In light of this, we will plan on DC home (lives with fried in apt at her house) tomorrow. -Will stop IV heparin tomorrow AM when greater than 48hrs.  - Continue ASA, Plavix, metoprolol, Imdur, pravastatin.  - Will follow up 1 week post hospital.  - If anginal symptoms worsen, may need to encourage cath once again.  - Could consider LHC again if she is willing.    Donato Schultz,  MD

## 2014-11-19 NOTE — Progress Notes (Signed)
ANTICOAGULATION CONSULT NOTE  Pharmacy Consult for Heparin Indication: chest pain/ACS  Allergies  Allergen Reactions  . Glimepiride     DIARRHEA GIVES PROBLEMS WITH IBS  . Sulfa Antibiotics Other (See Comments)    Childhood allergy    Patient Measurements: Height: 5\' 4"  (162.6 cm) Weight: 154 lb 8.7 oz (70.1 kg) IBW/kg (Calculated) : 54.7 Heparin Dosing Weight: 70 kg  Vital Signs: Temp: 99.1 F (37.3 C) (01/11 0416) Temp Source: Oral (01/11 0416) BP: 140/56 mmHg (01/11 0416) Pulse Rate: 59 (01/11 0416)  Labs:  Recent Labs  11/17/14 0123 11/17/14 1930  11/18/14 0020 11/18/14 0722 11/18/14 1548 11/19/14 0345  HGB 13.3  --   --  12.7  --   --  12.7  HCT 39.6  --   --  38.0  --   --  37.3  PLT 188  --   --  167  --   --  155  HEPARINUNFRC  --   --   < > 1.03* 0.81* 0.41 0.54  CREATININE 0.79  --   --  0.88  --   --  0.81  TROPONINI  --  1.06*  --  0.83*  --   --   --   < > = values in this interval not displayed.  Estimated Creatinine Clearance: 66.6 mL/min (by C-G formula based on Cr of 0.81).   Medications:  Infusions:  . heparin 850 Units/hr (11/18/14 2048)   Assessment: 64 year old female on anticoagulation with heparin for chest pain. Neurology work up is ongoing and cardiac cath has been postponed for now. Will continue to follow plan.  Heparin at goal (0.5) without bleeding complications, and cbc is stable.  Goal of Therapy:  Heparin level 0.3-0.7 units/ml Monitor platelets by anticoagulation protocol: Yes   Plan:  Continue heparin at 850 units/hr Check heparin level and cbc daily  Sheppard CoilFrank Marcel Gary PharmD., BCPS Clinical Pharmacist Pager [phone] 11/19/2014 1:30 PM

## 2014-11-19 NOTE — Progress Notes (Signed)
STROKE TEAM PROGRESS NOTE   HISTORY Shannon [name] is an 64 y.o. female history of recent CVA in December 2015, HTN, CAD, DM admitted with chest pain. During her admission she noted worsening vision (described as a more pronounced left VF cut and numbness on her left side). Due to chest pain and question of NSTEMI cardiology wanted neurology input in regards to safety of cardiac catherization.   Patient had an acute right PCA infarct in December. Notes having a left VF cut after that stroke but feels it has gotten much worse. She also now notes numbness on her left side. MRA at that time showed "markedly diminished flow or occlusion in right PCA...narrowing and irregularity in the proximal anterior and middle cerebral arteries".   Patient was not administered TPA secondary to stroke within past 90 days.    SUBJECTIVE (INTERVAL HISTORY) No family is at the bedside.  Overall she feels her condition is unchanged.    OBJECTIVE Temp:  [98.7 F (37.1 C)-99.1 F (37.3 C)] 99.1 F (37.3 C) (01/11 0416) Pulse Rate:  [59-72] 59 (01/11 0416) Cardiac Rhythm:  [-] Normal sinus rhythm (01/11 0807) Resp:  [18] 18 (01/11 0416) BP: (140-148)/(56-81) 140/56 mmHg (01/11 0416) SpO2:  [96 %-99 %] 96 % (01/11 0416)   Recent Labs Lab 11/17/14 0611  GLUCAP 153*    Recent Labs Lab 11/17/14 0123 11/18/14 0020 11/19/14 0345  NA 136 136 136  K 4.0 4.2 4.2  CL 103 106 104  CO2 GLUCOSE 232* 166* 139*  BUN CREATININE 0.79 0.88 0.81  CALCIUM 9.0 8.9 8.9    Recent Labs Lab 11/17/14 0123  AST 26  ALT 25  ALKPHOS 76  BILITOT 0.5  PROT 5.9*  ALBUMIN 3.7    Recent Labs Lab 11/17/14 0123 11/18/14 0020 11/19/14 0345  WBC 11.3* 8.9 7.6  NEUTROABS 8.5*  --   --   HGB 13.3 12.7 12.7  HCT 39.6 38.0 37.3  MCV 84.1 84.1 83.4  PLT 188 167 155    Recent Labs Lab 11/17/14 1930 11/18/14 0020  TROPONINI 1.06* 0.83*   No results for input(s): LABPROT, INR in the last  72 hours. No results for input(s): COLORURINE, LABSPEC, PHURINE, GLUCOSEU, HGBUR, BILIRUBINUR, KETONESUR, PROTEINUR, UROBILINOGEN, NITRITE, LEUKOCYTESUR in the last 72 hours.  Invalid input(s): APPERANCEUR     Component Value Date/Time   CHOL 133 11/18/2014 0020   TRIG 85 11/18/2014 0020   HDL 39* 11/18/2014 0020   CHOLHDL 3.4 11/18/2014 0020   VLDL 17 11/18/2014 0020   LDLCALC 77 11/18/2014 0020   Lab Results  Component Value Date   HGBA1C 7.9* 10/24/2014      Component Value Date/Time   LABOPIA NONE DETECTED 10/24/2014 0154   COCAINSCRNUR NONE DETECTED 10/24/2014 0154   LABBENZ NONE DETECTED 10/24/2014 0154   AMPHETMU NONE DETECTED 10/24/2014 0154   THCU NONE DETECTED 10/24/2014 0154   LABBARB NONE DETECTED 10/24/2014 0154    No results for input(s): ETH in the last 168 hours.  Mr Brain Wo Contrast  11/19/2014   CLINICAL DATA:  Worsening vision changes after stroke in December, numbness in LEFT face. Known high-grade stenosis versus occlusion RIGHT PCA.  EXAM: MRI HEAD WITHOUT CONTRAST  TECHNIQUE: Multiplanar, multiecho pulse sequences of the brain and surrounding structures were obtained without intravenous contrast.  COMPARISON:  MRI of the head October 24, 2014  FINDINGS: Confluent reduced diffusion within the RIGHT mesial temporal occipital lobe, in a  similar distribution to prior ischemia. Subcentimeter RIGHT splenium of the corpus callosum infarct, new from prior examination though, mildly decreased patchy ADC values.  Moderate ventriculomegaly, likely on the basis of global parenchymal brain volume loss as there is overall commensurate enlargement of cerebral sulci and cerebellar folia, unchanged from prior imaging. Mild FLAIR hyperintense signal and faint intrinsic T1 shortening within the RIGHT mesial occipital lobe corresponding to prior infarct. Additional white matter changes suggest sequelae of chronic small vessel ischemic disease. Patchy T2 bright signal in the RIGHT  posterolateral thalamus not evident. No midline shift or mass effect.  No abnormal extra-axial fluid collections. Normal major intracranial vascular flow voids seen at the skull base, however there is poor visualization of RIGHT posterior cerebral artery flow void caudal corresponding to prior angiographic abnormality.  IMPRESSION: Acute on subacute RIGHT posterior cerebral artery territory infarcts ; propagation from prior MRI October 23, 2014.  Poorly visualized RIGHT posterior cerebral artery flow void corresponding to prior angiographic abnormality.   Electronically Signed   By: Awilda Metroourtnay  Bloomer   On: 11/19/2014 02:35   Dg Hand 2 View Left  11/18/2014   CLINICAL DATA:  Initial evaluation 4 per left hand pain,Pt states she heard a pop on Thursday on the posterior part of her hand between the 4th and 5th metacarpals. Slight swelling and bump felt on hand. Pt states it is very painful.  EXAM: LEFT HAND - 2 VIEW  COMPARISON:  None.  FINDINGS: No fracture or dislocation. Mild dorsal soft soft tissue swelling at the level of the metacarpal bases. Mild degenerative change at the wrist and first carpometacarpal joint.  IMPRESSION: Mild focal soft tissue change.  Mild degenerative changes.   Electronically Signed   By: Esperanza Heiraymond  Rubner M.D.   On: 11/18/2014 19:55     PHYSICAL EXAM Pleasant middle aged lady not in distress.Awake alert. Afebrile. Head is nontraumatic. Neck is supple without bruit. Hearing is normal. Cardiac exam no murmur or gallop. Lungs are clear to auscultation. Distal pulses are well felt. Neurological Exam ;  Awake  Alert oriented x 3. Normal speech and language.eye movements full without nystagmus.fundi were not visualized. Vision acuity  Is normal but has dense left homonymous hemianopsia. Memory is intact. Recall 3/3.Marland Kitchen. Hearing is normal. Palatal movements are normal. Face symmetric. Tongue midline. Normal strength, tone, reflexes and coordination. Normal sensation. Gait  deferred. ASSESSMENT/PLAN [name] is a 64 y.o. female with history of recent CVA in December, HTN, CAD, DM admitted with chest pain. During her admission she noted worsening vision (described as a more pronounced left VF cut and numbness on her left side) presenting with left sided numbness and worsening vision . She did not receive IV t-PA due to stroke within past 90 days.   Stroke in Dec 2015 (right posterior cerebral artery with left sided superior quadrantanopia) with progression 10 days later (new inferior quadrantanopia). No new stroke this admission  In general, typically recommend delay of procedure/cardiac cath for 6-8 weeks, however, given patient's unstable cardiac status it is ok to proceed  MRI  Acute on subacute R PCA territory infarcts, poorly visualized R PCA flow  MRA  Not done this admission  IV heparin for VTE prophylaxis  Diet Heart thin liquids  aspirin 81 mg orally every day and clopidogrel 75 mg orally every day prior to admission, now on aspirin 81 mg orally every day, clopidogrel 75 mg orally every day and heparin. Ok for IV heparin given age/size of strokes in setting  of acute cardiac issue.  Patient counseled to be compliant with her antithrombotic medications  Ongoing aggressive stroke risk factor management  No further stroke workup indicated  Therapy recommendations:  HH PT and OT recommended during Dec 2015 admission  Disposition:  pending   Nothing further to add from the stroke standpoint  Follow up with [facility] in 2 months (as per Dec d/c instructions)  Hypertension  Stable  Hyperlipidemia  Home meds:  pravachol 40 resumed in hospital  LDL 77, goal < 70  Continue statin at discharge  Diabetes  HgbA1c 7.9 in Dec 2015, goal < 7.0  Other Stroke Risk Factors  Advanced age  Hx stroke/TIA - R PCA infarct w/ left field cut Dec 2015, hx TIA.   Coronary artery disease - stent May 2011  obstructive sleep apnea     Hospital day # 2  [facility] See Amion for Pager information 11/19/2014 9:31 AM   I have personally examined this patient, reviewed notes, independently viewed imaging studies, participated in medical decision making and plan of care. I have made any additions or clarifications directly to the above note. Agree with note above. Patient had a right PCA infarct in mid December and a week after discharge and had worsening of visual deficits and now has dense left homonymous hemianopsia. This likely represents subarachnoid worsening of her prior stroke from intracranial atherosclerosis. Patient may get cardiac catheterization at the present time and intervention if deemed necessary. Since her stroke is subacute and several weeks old  I feel the risk for hemorrhagic transformation from anticoagulation is mild compared to the risk of cardiac death is she needs intervention and it is not carried out  Delia Heady, MD Medical Director [facility] Pager: [phone] 11/19/2014 3:36 PM  To contact Stroke Continuity provider, please refer to [email or web address]. After hours, contact General Neurology

## 2014-11-20 ENCOUNTER — Telehealth: Payer: Self-pay | Admitting: Cardiology

## 2014-11-20 DIAGNOSIS — E785 Hyperlipidemia, unspecified: Secondary | ICD-10-CM | POA: Diagnosis present

## 2014-11-20 DIAGNOSIS — R52 Pain, unspecified: Secondary | ICD-10-CM | POA: Insufficient documentation

## 2014-11-20 DIAGNOSIS — E039 Hypothyroidism, unspecified: Secondary | ICD-10-CM | POA: Diagnosis present

## 2014-11-20 HISTORY — DX: Hyperlipidemia, unspecified: E78.5

## 2014-11-20 LAB — CBC
HEMATOCRIT: 40.1 % (ref 36.0–46.0)
Hemoglobin: 13.6 g/dL (ref 12.0–15.0)
MCH: 29.2 pg (ref 26.0–34.0)
MCHC: 33.9 g/dL (ref 30.0–36.0)
MCV: 86.1 fL (ref 78.0–100.0)
Platelets: 159 10*3/uL (ref 150–400)
RBC: 4.66 MIL/uL (ref 3.87–5.11)
RDW: 13.6 % (ref 11.5–15.5)
WBC: 6.9 10*3/uL (ref 4.0–10.5)

## 2014-11-20 LAB — HEPARIN LEVEL (UNFRACTIONATED): Heparin Unfractionated: 0.44 IU/mL (ref 0.30–0.70)

## 2014-11-20 NOTE — Care Management Note (Signed)
    Page 1 of 1   11/20/2014     4:31:16 PM CARE MANAGEMENT NOTE 11/20/2014  Patient:  Cardell PeachDUNN,Jone BONITA   Account Number:  [id number]  Date Initiated:  11/20/2014  Documentation initiated by:  Donn PieriniWEBSTER,Nacole Fluhr  Subjective/Objective Assessment:   Pt admitted with NSTEMI     Action/Plan:   PTA pt lived at home   Anticipated DC Date:  11/20/2014   Anticipated DC Plan:  HOME/SELF CARE      DC Planning Services  CM consult      Choice offered to / List presented to:             Status of service:  Completed, signed off Medicare Important Message given?  YES (If response is "NO", the following Medicare IM given date fields will be blank) Date Medicare IM given:  11/20/2014 Medicare IM given by:  Donn PieriniWEBSTER,Okechukwu Regnier Date Additional Medicare IM given:   Additional Medicare IM given by:    Discharge Disposition:  HOME/SELF CARE  Per UR Regulation:  Reviewed for med. necessity/level of care/duration of stay  If discussed at Long Length of Stay Meetings, dates discussed:    Comments:

## 2014-11-20 NOTE — Telephone Encounter (Signed)
TOC 1 week FU appt made 11-27-14 with scott

## 2014-11-20 NOTE — Discharge Instructions (Signed)
Myocardial Infarction  A myocardial infarction (MI) is also called a heart attack. It causes damage to the heart that cannot be fixed. An MI often happens when a blood clot or other blockage cuts blood flow to the heart. When this happens, certain areas of the heart begin to die. This is an emergency.  HOME CARE  · Take medicine as told by your doctor.  · Change certain behaviors as told by your doctor. This may include:  ¨ Quitting smoking.  ¨ Being active.  ¨ Keeping a healthy weight.  ¨ Eating a heart-healthy diet. Ask your doctor for help with this diet.  ¨ Keeping your diabetes under control.  ¨ Lessening stress.  ¨ Limiting how much alcohol you drink.  GET HELP RIGHT AWAY IF:  · You have crushing or pressure-like chest pain that spreads to the arms, back, neck, or jaw. Call your local emergency services (911 in [place] Do not drive yourself to the hospital.  · You have severe chest pain.  · You have shortness of breath during rest, sleep, or with activity.  · You have sudden sweating or clammy skin.  · You feel sick to your stomach (nauseous) and throw up (vomit).  · You suddenly get lightheaded or dizzy.  · You feel your heart beating fast or skipping beats.  MAKE SURE YOU:   · Understand these instructions.  · Will watch your condition.  · Will get help right away if you are not doing well or get worse.  Document Released: 04/26/2012 Document Reviewed: 12/29/2013  Exit[employer]® Patient Information ©2015 [employer]. This information is not intended to replace advice given to you by your health care provider. Make sure you discuss any questions you have with your health care provider.

## 2014-11-20 NOTE — Progress Notes (Signed)
Pt alert and oriented. Pt called out stating " vision is darker this morning". Dr. Pearlean BrownieSethi called and made aware. Discharge paperwork given and patient educated. V/S stable. Hiram Combererek Sequoyah Ramone RN

## 2014-11-20 NOTE — Progress Notes (Signed)
Medicare Important Message given? YES  (If response is "NO", the following Medicare IM given date fields will be blank)  Date Medicare IM given: 11/20/14 Medicare IM given by:  Kiril Hippe  

## 2014-11-20 NOTE — Progress Notes (Signed)
Primary cardiologist: Dr. Donato Schultz  Seen for followup: Chest pain  Subjective:    Interval progress notes reviewed, patient with recurring chest pain. Currently CP free.   Objective:   Temp:  [97.6 F (36.4 C)-99 F (37.2 C)] 97.6 F (36.4 C) (01/12 0600) Pulse Rate:  [54-65] 54 (01/12 0600) Resp:  [18] 18 (01/12 0600) BP: (107-156)/(52-73) 139/56 mmHg (01/12 0600) SpO2:  [96 %-98 %] 98 % (01/12 0600) Last BM Date: 11/17/14  Filed Weights   11/17/14 0617  Weight: 154 lb 8.7 oz (70.1 kg)    Intake/Output Summary (Last 24 hours) at 11/20/14 0751 Last data filed at 11/19/14 1300  Gross per 24 hour  Intake    480 ml  Output    500 ml  Net    -20 ml    Telemetry: Sinus rhythm.  Exam:  General: No distress.  Lungs: Clear, nonlabored.  Cardiac: RRR, no gallop.  Abdomen: NABS.  Extremities: No pitting edema.  Lab Results:  Basic Metabolic Panel:  Recent Labs Lab 11/17/14 0123 11/18/14 0020 11/19/14 0345  NA 136 136 136  K 4.0 4.2 4.2  CL 103 106 104  CO2 GLUCOSE 232* 166* 139*  BUN CREATININE 0.79 0.88 0.81  CALCIUM 9.0 8.9 8.9    Liver Function Tests:  Recent Labs Lab 11/17/14 0123  AST 26  ALT 25  ALKPHOS 76  BILITOT 0.5  PROT 5.9*  ALBUMIN 3.7    CBC:  Recent Labs Lab 11/18/14 0020 11/19/14 0345 11/20/14 0539  WBC 8.9 7.6 6.9  HGB 12.7 12.7 13.6  HCT 38.0 37.3 40.1  MCV 84.1 83.4 86.1  PLT 167 155 159    Cardiac Enzymes:  Recent Labs Lab 11/17/14 1930 11/18/14 0020  TROPONINI 1.06* 0.83*    ECG: Sinus rhythm with nonspecific ST-T changes.   Medications:   Scheduled Medications: . amLODipine  5 mg Oral Daily  . aspirin EC  81 mg Oral q morning - 10a  . clopidogrel  75 mg Oral Q breakfast  . escitalopram  10 mg Oral Daily  . isosorbide mononitrate  30 mg Oral Daily  . levothyroxine  25 mcg Oral QAC breakfast  . metoprolol  50 mg Oral BID  . pantoprazole  40 mg Oral Daily  .  pravastatin  40 mg Oral q1800  . vitamin E  400 Units Oral q morning - 10a    Infusions: . heparin 850 Units/hr (11/20/14 0531)    PRN Medications: acetaminophen, ALPRAZolam, gabapentin, gi cocktail, hydrOXYzine, nitroGLYCERIN, ondansetron (ZOFRAN) IV   Assessment:   1. Presentation with atypical chest pain, however subsequent cardiac markers have trended abnormally consistent with NSTEMI. ECG shows nonspecific ST-T changes. She is currently on heparin.  2. CAD status post DES to the mid LAD in May 2011. Myoview in July 2015 was negative for ischemia.  3. Essential hypertension.  4. Type 2 diabetes mellitus.  5. 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 She has been on aspirin and Plavix. Not obviously embolic. Dopplers showed only 1-39% bilateral ICA disease. She has residual left-sided weakness mainly in her leg, also reports intermittently progressive visual symptoms described as "dimming." This is been going on over the last few weeks.  6. Reports left hand pain, states that she felt a "pop" on the lateral aspect, has been using some exercise bands at home. Reports worsening pain over the last week. Plain  films unremarkable.  7. PVC's - noted on tele. Benign. Bb.   Plan/Discussion:    Optimally would like to pursue left heart cath, however she is not wishing to proceed at this time. She is worried about risk of further stroke. I explained to her low overall risk of periprocedural CVA and that I discussed with Dr. Pearlean BrownieSethi of neurology who said we could proceed. She still wishes not to move forward.  In light of this, we will plan on DC home (lives with fried in apt at her house) -Will stop IV heparin (greater than 48hrs).  - Continue ASA, Plavix, metoprolol, Imdur, pravastatin.  - Will follow up 1 week post hospital.  - If anginal symptoms worsen, may need to encourage cath once again.   Donato SchultzSKAINS, Nyomie Ehrlich, MD

## 2014-11-20 NOTE — Telephone Encounter (Signed)
New Message//TCM   Per staff messages 7-10 TCM   Appt Scheduled on Flex on 11/27/2014 @ 1:30pm

## 2014-11-20 NOTE — Progress Notes (Signed)
Vision worse she states. Calling neuro to evaluate prior to DC. Discussed with nursing.   Donato SchultzSKAINS, Gennaro Lizotte, MD

## 2014-11-20 NOTE — Discharge Summary (Signed)
Patient ID: Shannon [name],  MRN: [id number], DOB/AGE: 07-23-1951 64 y.o.  Admit date: 11/17/2014 Discharge date: 11/20/2014  Primary Care Provider: Chari Manning, NP Primary Cardiologist: Dr Marlou Porch  Discharge Diagnoses Principal Problem:   NSTEMI (non-ST elevated myocardial infarction) Active Problems:   CAD S/P LAD PCI July 2011   Acute CVA -Dec 2015   DM type 2 (diabetes mellitus, type 2)   Fibromyalgia   IBS (irritable bowel syndrome)   Dyslipidemia   Hypothyroidism    Hospital Course:   64F with CAD s/p PCI (LAD, 2011), low risk Myoview July 2015, HTN, DM, dyslipidemia, and CVA in Dec 2015. She presented with [place] 11/17/14 and ruled in for NSTEMI. She was placed on IV Heparin. We considered further evaluation with coronary angiogram but were hesitant with her history of recent CVA. She was seen in consult by Neurology on 11/18/14. A repeat MRI noted " acute on subacute R PCA territory infarcts, poorly visualized R PCA flow" but he did not feel she had had a new stroke this admission. Her case was discussed with Dr. Leonie Man of neurology who felt we could proceed with cath from a neurological standpoint but patient decided she did not want to proceed secondary to her concern for recurrent CVA. Dr Marlou Porch saw her on the morning of the 12th and felt she could be discharged. She will need early (TCM) follow up in one week.  Discharge Vitals:  Blood pressure 139/56, pulse 54, temperature 97.6 F (36.4 C), temperature source Oral, resp. rate 18, height '5\' 4"'  (1.626 m), weight 154 lb 8.7 oz (70.1 kg), SpO2 98 %.    Labs: Results for orders placed or performed during the hospital encounter of 11/17/14 (from the past 24 hour(s))  Heparin level (unfractionated)     Status: None   Collection Time: 11/20/14  5:39 AM  Result Value Ref Range   Heparin Unfractionated 0.44 0.30 - 0.70 IU/mL  CBC     Status: None   Collection Time: 11/20/14  5:39 AM  Result Value Ref Range   WBC 6.9 4.0 -  10.5 K/uL   RBC 4.66 3.87 - 5.11 MIL/uL   Hemoglobin 13.6 12.0 - 15.0 g/dL   HCT 40.1 36.0 - 46.0 %   MCV 86.1 78.0 - 100.0 fL   MCH 29.2 26.0 - 34.0 pg   MCHC 33.9 30.0 - 36.0 g/dL   RDW 13.6 11.5 - 15.5 %   Platelets 159 150 - 400 K/uL    Disposition:      Follow-up Information    Follow up with Candee Furbish, MD.   Specialty:  Cardiology   Why:  office will call you   Contact information:   [address] [id number] [phone]       Discharge Medications:    Medication List    TAKE these medications        ALPRAZolam 0.5 MG tablet  Commonly known as:  XANAX  Take 1 tablet (0.5 mg total) by mouth daily as needed for anxiety.     amLODipine 5 MG tablet  Commonly known as:  NORVASC  Take 1 tablet (5 mg total) by mouth daily.     aspirin EC 81 MG tablet  Take 81 mg by mouth every morning.     clopidogrel 75 MG tablet  Commonly known as:  PLAVIX  Take 1 tablet (75 mg total) by mouth daily with breakfast.     escitalopram 10 MG  tablet  Commonly known as:  LEXAPRO  Take 1 tablet (10 mg total) by mouth daily.     freestyle lancets  Check blood sugar four times per day. Before each meal and at bedtime     gabapentin 100 MG capsule  Commonly known as:  NEURONTIN  Take 100 mg by mouth daily as needed (pain).     glimepiride 2 MG tablet  Commonly known as:  AMARYL  Take 1 tablet (2 mg total) by mouth 2 (two) times daily.     glucose blood test strip  Check blood sugar before each meal and at bedtime total 4 times per day     glucose monitoring kit monitoring kit  Test blood sugars 3 times per day as prescribed     hydrOXYzine 10 MG tablet  Commonly known as:  ATARAX/VISTARIL  Take 1 tablet (10 mg total) by mouth 3 (three) times daily as needed.     isosorbide mononitrate 30 MG 24 hr tablet  Commonly known as:  IMDUR  TAKE 1 TABLET (30 MG TOTAL) BY MOUTH EVERY MORNING.     levothyroxine 25 MCG tablet  Commonly known as:   SYNTHROID, LEVOTHROID  TAKE 1 TABLET (25 MCG TOTAL) BY MOUTH DAILY BEFORE BREAKFAST.     metoprolol 50 MG tablet  Commonly known as:  LOPRESSOR  Take 1 tablet (50 mg total) by mouth 2 (two) times daily. For blood pressure     nitroGLYCERIN 0.4 MG SL tablet  Commonly known as:  NITROSTAT  Place 1 tablet (0.4 mg total) under the tongue every 5 (five) minutes as needed for chest pain.     omeprazole 20 MG capsule  Commonly known as:  PRILOSEC  Take 1 capsule (20 mg total) by mouth daily. For acid reflux     pravastatin 40 MG tablet  Commonly known as:  PRAVACHOL  Take 1 tablet (40 mg total) by mouth daily.     triamcinolone cream 0.1 %  Commonly known as:  KENALOG  Apply 1 application topically 2 (two) times daily. Do not apply to face     vitamin E 400 UNIT capsule  Generic drug:  vitamin E  Take 400 Units by mouth every morning.         Duration of Discharge Encounter: Greater than 30 minutes including physician time.  Angelena Form PA-C 11/20/2014 10:38 AM   Personally seen and examined. Agree with above. Visual loss from CVA in 12/15 Did not want cath (see progress notes) Candee Furbish, MD

## 2014-11-21 ENCOUNTER — Telehealth: Payer: Self-pay | Admitting: Cardiology

## 2014-11-21 NOTE — Telephone Encounter (Signed)
Is this OK or do you want the orders to come from her PCP?

## 2014-11-21 NOTE — Telephone Encounter (Signed)
Left message to call back  

## 2014-11-21 NOTE — Telephone Encounter (Signed)
F/U          Maralyn SagoSarah RN from [facility] calling, may be reached at [phone]   Wants orders to resume pt care, please call.

## 2014-11-21 NOTE — Telephone Encounter (Signed)
New Message     [facility] would like to get orders to get resume nursing and PT/ OT.   Please Call Thanks

## 2014-11-21 NOTE — Telephone Encounter (Signed)
Attempted to call patient. Was told she is unavailable now and to call back later.

## 2014-11-22 NOTE — Telephone Encounter (Signed)
New message     Please call back

## 2014-11-22 NOTE — Telephone Encounter (Signed)
Voicemail only. She has not returned calls.

## 2014-11-22 NOTE — Telephone Encounter (Signed)
Just an FYI, [facility] Do you want them to continue seeing her for the NSTEMI?

## 2014-11-22 NOTE — Telephone Encounter (Signed)
OK for HH to continue seeing her with recent NSTEMI Donato SchultzSKAINS, Priseis Cratty, MD

## 2014-11-23 NOTE — Telephone Encounter (Signed)
Left message of Dr Anne FuSkains orders and to call back with further concerns or questions

## 2014-11-26 ENCOUNTER — Encounter: Payer: Self-pay | Admitting: Neurology

## 2014-11-26 ENCOUNTER — Ambulatory Visit (INDEPENDENT_AMBULATORY_CARE_PROVIDER_SITE_OTHER): Payer: Medicare Other | Admitting: Neurology

## 2014-11-26 VITALS — BP 115/70 | HR 51 | Ht 64.0 in | Wt 153.4 lb

## 2014-11-26 DIAGNOSIS — H539 Unspecified visual disturbance: Secondary | ICD-10-CM | POA: Insufficient documentation

## 2014-11-26 DIAGNOSIS — I6330 Cerebral infarction due to thrombosis of unspecified cerebral artery: Secondary | ICD-10-CM

## 2014-11-26 NOTE — Progress Notes (Signed)
Reason for visit: stroke  Shannon [name] is an 64 y.o. female  History of present illness:  Shannon [name] is a 64 year old right-handed white female with a history of a right posterior cerebral artery distribution stroke in the middle of December 2015. The patient was admitted to the hospital at that time, and she appeared to have diminished flow in the right posterior cerebral artery, some stenosis in the anterior cerebral artery and middle cerebral arteries bilaterally as well. The patient has a history of coronary artery disease, with a recent stent placement. She is on aspirin and Plavix, and she was taking this at the time of the stroke. Carotid Doppler studies were unremarkable, and a 2-D echocardiogram did not show evidence of a cardiac source of embolus. The patient returned to the hospital around 11/17/2014 with chest pain. She was found to have some acute extension of the posterior cerebral distribution stroke on the right. No further stroke workup was recommended. The patient has remain on aspirin and Plavix. The patient indicates that her cardiologist may do a coronary artery catheterization at some point in the future. The patient indicates that she has intermittent dimming of vision in both eyes. She has a left homonymous visual field deficit. She does not drive a car this point, she is having problems watching TV because of the visual field deficit. She reports no persistent new symptoms, but she does occasionally have some transient left-sided numbness. She reports no falls. She has a history of chronic low back pain. She denies any issues controlling the bowels or the bladder. She has a history of irritable bowel syndrome.  Past Medical History  Diagnosis Date  . Hypertension   . Coronary artery disease   . Diabetes mellitus without complication   . Arthritis   . Fibromyalgia   . Irritable bowel   . Anxiety   . Depression   . Fibromyalgia 1995  . Acute MI     Past Surgical  History  Procedure Laterality Date  . Coronary stent placement    . Cholecystectomy    . Orif arm fracture Right     forearm    Family History  Problem Relation Age of Onset  . Cancer Mother   . Diabetes Mother   . Hypertension Mother   . Heart failure Mother   . CAD Mother   . Cancer Father   . Diabetes Father   . Hypertension Father   . Heart failure Father   . CAD Father   . CAD Brother   . Diabetes Brother   . Hypertension Brother   . CAD Sister   . Diabetes Sister   . Hypertension Sister     Social history:  reports that she has never smoked. She has never used smokeless tobacco. She reports that she does not drink alcohol or use illicit drugs.    Allergies  Allergen Reactions  . Glimepiride     DIARRHEA GIVES PROBLEMS WITH IBS  . Sulfa Antibiotics Other (See Comments)    Childhood allergy    Medications:  Current Outpatient Prescriptions on File Prior to Visit  Medication Sig Dispense Refill  . amLODipine (NORVASC) 5 MG tablet Take 1 tablet (5 mg total) by mouth daily. 30 tablet 3  . aspirin EC 81 MG tablet Take 81 mg by mouth every morning.    . clopidogrel (PLAVIX) 75 MG tablet Take 1 tablet (75 mg total) by mouth daily with breakfast. 30 tablet 6  . glimepiride (AMARYL)  2 MG tablet Take 1 tablet (2 mg total) by mouth 2 (two) times daily. 60 tablet 3  . glucose blood test strip Check blood sugar before each meal and at bedtime total 4 times per day 100 each 12  . glucose monitoring kit (FREESTYLE) monitoring kit Test blood sugars 3 times per day as prescribed 1 each 0  . hydrOXYzine (ATARAX/VISTARIL) 10 MG tablet Take 1 tablet (10 mg total) by mouth 3 (three) times daily as needed. (Patient taking differently: Take 10 mg by mouth 3 (three) times daily as needed for itching or anxiety. ) 30 tablet 3  . isosorbide mononitrate (IMDUR) 30 MG 24 hr tablet TAKE 1 TABLET (30 MG TOTAL) BY MOUTH EVERY MORNING. 30 tablet 1  . Lancets (FREESTYLE) lancets Check blood  sugar four times per day. Before each meal and at bedtime 100 each 12  . levothyroxine (SYNTHROID, LEVOTHROID) 25 MCG tablet TAKE 1 TABLET (25 MCG TOTAL) BY MOUTH DAILY BEFORE BREAKFAST. 30 tablet 2  . metoprolol (LOPRESSOR) 50 MG tablet Take 1 tablet (50 mg total) by mouth 2 (two) times daily. For blood pressure 60 tablet 3  . nitroGLYCERIN (NITROSTAT) 0.4 MG SL tablet Place 1 tablet (0.4 mg total) under the tongue every 5 (five) minutes as needed for chest pain. 25 tablet 0  . omeprazole (PRILOSEC) 20 MG capsule Take 1 capsule (20 mg total) by mouth daily. For acid reflux (Patient taking differently: Take 20 mg by mouth daily as needed (acid reflux). For acid reflux) 30 capsule 3  . pravastatin (PRAVACHOL) 40 MG tablet Take 1 tablet (40 mg total) by mouth daily. 30 tablet 3  . vitamin E (VITAMIN E) 400 UNIT capsule Take 400 Units by mouth every morning.    . escitalopram (LEXAPRO) 10 MG tablet Take 1 tablet (10 mg total) by mouth daily. (Patient not taking: Reported on 11/26/2014) 30 tablet 0  . [DISCONTINUED] isosorbide dinitrate (ISORDIL) 30 MG tablet Take 1 tablet (30 mg total) by mouth every morning. 30 tablet 2   No current facility-administered medications on file prior to visit.    ROS:  Out of a complete 14 system review of symptoms, the patient complains only of the following symptoms, and all other reviewed systems are negative.  Blurred vision, loss of vision Cough Feeling cold Achy muscles Anxiety, not enough sleep, racing thoughts  Blood pressure 115/70, pulse 51, height '5\' 4"'  (1.626 m), weight 153 lb 6.4 oz (69.582 kg).  Physical Exam  General: The patient is alert and cooperative at the time of the examination.  Skin: No significant peripheral edema is noted.   Neurologic Exam  Mental status: The patient is oriented x 3.  Cranial nerves: Facial symmetry is present. Speech is normal, no aphasia or dysarthria is noted. Extraocular movements are full. Visual fields  are notable for a dense left homonymous visual field deficit.  Motor: The patient has good strength in all 4 extremities.  Sensory examination: The patient reports some decreased pinprick sensation on the left face relative to the right, increased sensation on the left arm and leg relative to the right.  Coordination: The patient has good finger-nose-finger and heel-to-shin bilaterally.  Gait and station: The patient has a normal gait. Tandem gait is slightly unsteady. Romberg is negative. No drift is seen.  Reflexes: Deep tendon reflexes are symmetric.   MRI Brain 11/19/14:  IMPRESSION: Acute on subacute RIGHT posterior cerebral artery territory infarcts ; propagation from prior MRI October 23, 2014.  Poorly visualized  RIGHT posterior cerebral artery flow void corresponding to prior angiographic abnormality.  *The images of this study were reviewed online.   Carotid Doppler study 10/24/2014:  Summary: No significant ICA stenosis noted bilaterally. (1 to 39%)  Vertebral artery flow antegrade bilaterally.   2-D echocardiogram 10/25/2014:  Study Conclusions  - Left ventricle: The cavity size was normal. There was moderate concentric hypertrophy. Systolic function was normal. The estimated ejection fraction was in the range of 50% to 55%. Wall motion was normal; there were no regional wall motion abnormalities. Features are consistent with a pseudonormal left ventricular filling pattern, with concomitant abnormal relaxation and increased filling pressure (grade 2 diastolic dysfunction). - Mitral valve: Mildly calcified annulus. - Left atrium: The atrium was mildly dilated. - Atrial septum: There was increased thickness of the septum, consistent with lipomatous hypertrophy.   Assessment/Plan:  1. Cerebrovascular disease, right posterior cerebral artery distribution stroke  2. Coronary artery disease, history of coronary artery stent placement  3.  Left homonymous visual field deficit  The patient likely will have permanent visual field deficit off to the left. The patient is unable to operate a motor vehicle at this time. She will continue on aspirin and Plavix for now. She reports intermittent dimming of vision, and some occasional headaches. The etiology of this is not clear. The patient does have other areas of atherosclerosis affecting the middle cerebral and the anterior cerebral artery bilaterally. We will check a sedimentation rate today given the history of headaches and vision changes. The patient otherwise will follow-up in 4 months. She will contact our office if new changes occur.  Jill Alexanders MD 11/26/2014 7:39 PM  [facility] [address] [place], [place] [address]  Phone [phone] Fax [phone]

## 2014-11-26 NOTE — Patient Instructions (Signed)
Visual Disturbances °You have had a disturbance in your vision. This may be caused by various conditions, such as: °· Migraines. Migraine headaches are often preceded by a disturbance in vision. Blind spots or light flashes are followed by a headache. This type of visual disturbance is temporary. It does not damage the eye. °· Glaucoma. This is caused by increased pressure in the eye. Symptoms include haziness, blurred vision, or seeing rainbow colored circles when looking at bright lights. Partial or complete visual loss can occur. You may or may not experience eye pain. Visual loss may be gradual or sudden and is irreversible. Glaucoma is the leading cause of blindness. °· Retina problems. Vision will be reduced if the retina becomes detached or if there is a circulation problem as with diabetes, high blood pressure, or a mini-stroke. Symptoms include seeing "floaters," flashes of light, or shadows, as if a curtain has fallen over your eye. °· Optic nerve problems. The main nerve in your eye can be damaged by redness, soreness, and swelling (inflammation), poor circulation, drugs, and toxins. °It is very important to have a complete exam done by a specialist to determine the exact cause of your eye problem. The specialist may recommend medicines or surgery, depending on the cause of the problem. This can help prevent further loss of vision or reduce the risk of having a stroke. Contact the caregiver to whom you have been referred and arrange for follow-up care right away. °SEEK IMMEDIATE MEDICAL CARE IF:  °· Your vision gets worse. °· You develop severe headaches. °· You have any weakness or numbness in the face, arms, or legs. °· You have any trouble speaking or walking. °Document Released: 12/03/2004 Document Revised: 01/18/2012 Document Reviewed: 03/26/2010 °[employer]® Patient Information ©2015 [employer]. This information is not intended to replace advice given to you by your health care provider. Make sure  you discuss any questions you have with your health care provider. ° °

## 2014-11-27 ENCOUNTER — Ambulatory Visit (INDEPENDENT_AMBULATORY_CARE_PROVIDER_SITE_OTHER): Payer: Medicare Other | Admitting: Physician Assistant

## 2014-11-27 ENCOUNTER — Ambulatory Visit (INDEPENDENT_AMBULATORY_CARE_PROVIDER_SITE_OTHER): Payer: Medicare Other | Admitting: Pharmacist

## 2014-11-27 ENCOUNTER — Encounter: Payer: Self-pay | Admitting: Physician Assistant

## 2014-11-27 VITALS — BP 142/80 | HR 60 | Ht 64.0 in | Wt 153.0 lb

## 2014-11-27 DIAGNOSIS — Z8673 Personal history of transient ischemic attack (TIA), and cerebral infarction without residual deficits: Secondary | ICD-10-CM

## 2014-11-27 DIAGNOSIS — E039 Hypothyroidism, unspecified: Secondary | ICD-10-CM

## 2014-11-27 DIAGNOSIS — F419 Anxiety disorder, unspecified: Secondary | ICD-10-CM

## 2014-11-27 DIAGNOSIS — I10 Essential (primary) hypertension: Secondary | ICD-10-CM

## 2014-11-27 DIAGNOSIS — I214 Non-ST elevation (NSTEMI) myocardial infarction: Secondary | ICD-10-CM

## 2014-11-27 DIAGNOSIS — I6330 Cerebral infarction due to thrombosis of unspecified cerebral artery: Secondary | ICD-10-CM

## 2014-11-27 DIAGNOSIS — I2510 Atherosclerotic heart disease of native coronary artery without angina pectoris: Secondary | ICD-10-CM

## 2014-11-27 DIAGNOSIS — E785 Hyperlipidemia, unspecified: Secondary | ICD-10-CM

## 2014-11-27 LAB — SEDIMENTATION RATE: Sed Rate: 6 mm/hr (ref 0–40)

## 2014-11-27 MED ORDER — ASPIRIN EC 81 MG PO TBEC: 81.0000 mg | DELAYED_RELEASE_TABLET | Freq: Every day | ORAL | Status: DC

## 2014-11-27 MED ORDER — LEVOTHYROXINE SODIUM 25 MCG PO TABS: ORAL_TABLET | ORAL | Status: DC

## 2014-11-27 MED ORDER — PANTOPRAZOLE SODIUM 40 MG PO TBEC: 40.0000 mg | DELAYED_RELEASE_TABLET | Freq: Every day | ORAL | Status: DC

## 2014-11-27 MED ORDER — NITROGLYCERIN 0.4 MG SL SUBL
0.4000 mg | SUBLINGUAL_TABLET | SUBLINGUAL | Status: DC | PRN
Start: 2014-11-27 — End: 2017-06-12

## 2014-11-27 MED ORDER — LISINOPRIL 5 MG PO TABS
5.0000 mg | ORAL_TABLET | Freq: Every day | ORAL | Status: DC
Start: 2014-11-27 — End: 2014-12-28

## 2014-11-27 MED ORDER — ATORVASTATIN CALCIUM 40 MG PO TABS: 40.0000 mg | ORAL_TABLET | Freq: Every day | ORAL | Status: DC

## 2014-11-27 NOTE — Progress Notes (Deleted)
Pharmacy Transitions of Care Post-ACS Visit  Admit Complaint: Shannon [name] is a 61 yof who was discharged from [facility] on 11/21/14 after NSTEMI. PMH is significant for CAD s/p PCI in 2011, CVA in Dec 2015, DM2, fibromyalgia, IBS, HLD, hypothyroidism, and HTN. Patient presents to clinic for pharmacy medication reconciliation.  Medications and allergies were reviewed with patient, although medications were not brought to today's appointment. Pt has decent recall of medications and doses.  Current Outpatient Prescriptions  Medication Sig Dispense Refill  . amLODipine (NORVASC) 5 MG tablet Take 1 tablet (5 mg total) by mouth daily. 30 tablet 3  . aspirin EC 81 MG tablet Take 81 mg by mouth every morning.    . clopidogrel (PLAVIX) 75 MG tablet Take 1 tablet (75 mg total) by mouth daily with breakfast. 30 tablet 6  . escitalopram (LEXAPRO) 10 MG tablet Take 1 tablet (10 mg total) by mouth daily. (Patient not taking: Reported on 11/26/2014) 30 tablet 0  . glimepiride (AMARYL) 2 MG tablet Take 1 tablet (2 mg total) by mouth 2 (two) times daily. 60 tablet 3  . glucose blood test strip Check blood sugar before each meal and at bedtime total 4 times per day 100 each 12  . glucose monitoring kit (FREESTYLE) monitoring kit Test blood sugars 3 times per day as prescribed 1 each 0  . hydrOXYzine (ATARAX/VISTARIL) 10 MG tablet Take 1 tablet (10 mg total) by mouth 3 (three) times daily as needed. (Patient taking differently: Take 10 mg by mouth 3 (three) times daily as needed for itching or anxiety. ) 30 tablet 3  . isosorbide mononitrate (IMDUR) 30 MG 24 hr tablet TAKE 1 TABLET (30 MG TOTAL) BY MOUTH EVERY MORNING. 30 tablet 1  . Lancets (FREESTYLE) lancets Check blood sugar four times per day. Before each meal and at bedtime 100 each 12  . levothyroxine (SYNTHROID, LEVOTHROID) 25 MCG tablet TAKE 1 TABLET (25 MCG TOTAL) BY MOUTH DAILY BEFORE BREAKFAST. 30 tablet 2  . metoprolol (LOPRESSOR) 50 MG tablet Take 1 tablet (50  mg total) by mouth 2 (two) times daily. For blood pressure 60 tablet 3  . nitroGLYCERIN (NITROSTAT) 0.4 MG SL tablet Place 1 tablet (0.4 mg total) under the tongue every 5 (five) minutes as needed for chest pain. 25 tablet 0  . omeprazole (PRILOSEC) 20 MG capsule Take 1 capsule (20 mg total) by mouth daily. For acid reflux (Patient taking differently: Take 20 mg by mouth daily as needed (acid reflux). For acid reflux) 30 capsule 3  . pravastatin (PRAVACHOL) 40 MG tablet Take 1 tablet (40 mg total) by mouth daily. 30 tablet 3  . vitamin E (VITAMIN E) 400 UNIT capsule Take 400 Units by mouth every morning.    . [DISCONTINUED] isosorbide dinitrate (ISORDIL) 30 MG tablet Take 1 tablet (30 mg total) by mouth every morning. 30 tablet 2   No current facility-administered medications for this visit.    ACS Medication Checklist:     Medication      YES      NO N/A or C/I and Explanation  Beta Blocker '[x]'  '[]'    ACEi or ARB '[]'  '[x]'  Not prescribed on discharge and no noted contraindications to ACEi. Pt thinks she may have been on an ACEi in the past and could have had a reaction, but she doesn't remember. Is willing to start taking again.   High-dose statin    (atorvastatin 40 or 53m, rosuvastatin 20 or 428m '[]'  '[x]'  Pt discharged on pravastatin  85m daily. Lipid panel ok with LDL <100 but should be on high dose statin for 50% reduction in LDL.  Clopidogrel +/- other         anti-platelets '[x]'         '[]'   Plavix  Aspirin '[x]'  '[]'    Nitroglycerin '[x]'  '[]'     Smoking cessation counseling provided '[]'  '[]'  Pt does not smoke  Cardiac rehab referral '[]'         '[x]'      Assessment/Plan: refer to chart below.    Interventions during today's visit include: Intervention     YES      NO  Explanation   Indications      Needs Therapy '[x]'  '[]'  Pt not discharged on an ACEi.   Unnecessary Therapy '[]'  '[x]'    Efficacy     Suboptimal Drug Selection '[x]'  '[]'  Discharged on moderate instead of high intensity statin.  Insufficient  Dose/Duration '[]'  '[x]'     Safety      Adverse Drug Event '[x]'  '[]'  Glimepiride listed under drug allergy (exacerbates IBS symptoms) and also on medication list. Pt states she takes this BID and does not have any problems.   Drug Interaction '[x]'  '[]'  Pt discharged on omeprazole and Plavix. Omeprazole decreases the efficacy of Plavix. Pt reports she has been taking omeprazole daily. Should be switched to pantoprazole or another PPI. Pt asked if she could still take Tums for prn symptoms, informed her this was fine.   Excessive Dose/Duration '[]'  '[x]'    Compliance     Underuse '[]'  '[x]'     Overuse '[x]'  '[]'  Pt reports taking 3221mASA daily rather than 8162maily. Informed her to reduce this dose to 16m56mily.   Pt states that she needs refills on her Lexapro, Xanax, NTG, and levothyroxine. Pt reports that Lexapro and Xanax were new prescriptions when she was discharged from the hospital. She also brings in a new prescription for Buspar today as well for anxiety.  Medication changes today include *** and were discussed with and approved by ***.

## 2014-11-27 NOTE — Progress Notes (Signed)
Cardiology Office Note   Date:  11/27/2014   ID:  Shannon [name], DOB [birth year], MRN [id number]  PCP:  Chari Manning, NP  Cardiologist:  Dr. Candee Furbish     No chief complaint on file.   History of Present Illness: Shannon [name] is a 64 y.o. female who presents for post hospital FU.   She has a history of CAD status post prior DES to the LAD in 2011, recurrent chest pain, HTN, DM2, fibromyalgia, depression, IBS.    Admitted 12/15-12/18 with acute right brain CVA.  Carotid Dopplers demonstrated no significant ICA stenosis and echocardiogram continued to demonstrate normal LV function.  Admitted 1/9-1/12 with a non-STEMI. Cardiac catheterization was recommended. The patient was hesitant given her recent history of stroke. Neurology was consulted. Repeat MRI was performed. It was felt that it was acceptable to proceed with cardiac catheterization from a neurologic standpoint. However, the patient preferred to decline proceeding with cardiac catheterization due to risk of further stroke. She completed 48 hours of IV heparin. She was discharged on a combination of aspirin, Plavix, statin, nitrates and beta blocker. Dr. Marlou Porch saw her in the hospital and recommended proceeding with cardiac catheterization if anginal symptoms worsen.  Since discharge, she denies any further chest discomfort or significant dyspnea. She walks with walker. She has cortical therapy coming out to see her. She denies PND. She sleeps on an incline chronically. She denies LE edema. She has had a cough recently. She's had some whitish sputum. She denies fever or hemoptysis. She continues to note left foot numbness and changes in her vision related to her stroke.  Studies Reviewed Today:  - LHC 03/2010: Mid LAD 70%, normal ejection fraction  >> PCI:  2.75 x 15 mm Promus DES to LAD  - Echocardiogram 10/25/14: Moderate LVH, EF 50-55%, normal wall motion, grade 2 diastolic dysfunction, mild LAE, atrial septal  lipomatous hypertrophy  - Nuclear Stress Test 05/29/14: No ischemia, EF 59%, Normal  - Carotid US 10/24/14: No significant ICA stenosis bilaterally     Past Medical History  Diagnosis Date  . Hypertension   . Coronary artery disease     a. s/p PCI with DES to LAD in 2011  ([facility] 03/2010: Mid LAD 70%, normal ejection fraction  >> PCI:  2.75 x 15 mm Promus DES to LAD); b. Nuclear Stress Test 05/29/14: No ischemia, EF 59%, Normal; c. NSTEMI 11/2014 >> patient declined LHC b/c of recent CVA  . Diabetes mellitus without complication   . Arthritis   . Irritable bowel   . Anxiety   . Depression   . Fibromyalgia 1995  . Hx of echocardiogram     a. Echocardiogram 10/25/14: Moderate LVH, EF 50-55%, normal wall motion, grade 2 diastolic dysfunction, mild LAE, atrial septal lipomatous hypertrophy  . H/O ischemic right PCA stroke 10/2014    a. Carotid US 10/24/14: No significant ICA stenosis bilaterally    Past Surgical History  Procedure Laterality Date  . Coronary stent placement    . Cholecystectomy    . Orif arm fracture Right     forearm     Current Outpatient Prescriptions  Medication Sig Dispense Refill  . amLODipine (NORVASC) 5 MG tablet Take 1 tablet (5 mg total) by mouth daily. 30 tablet 3  . aspirin EC 81 MG tablet Take 81 mg by mouth every morning.    . clopidogrel (PLAVIX) 75 MG tablet Take 1 tablet (75 mg total) by mouth daily with breakfast. 30 tablet 6  .  escitalopram (LEXAPRO) 10 MG tablet Take 1 tablet (10 mg total) by mouth daily. (Patient not taking: Reported on 11/26/2014) 30 tablet 0  . glimepiride (AMARYL) 2 MG tablet Take 1 tablet (2 mg total) by mouth 2 (two) times daily. 60 tablet 3  . glucose blood test strip Check blood sugar before each meal and at bedtime total 4 times per day 100 each 12  . glucose monitoring kit (FREESTYLE) monitoring kit Test blood sugars 3 times per day as prescribed 1 each 0  . hydrOXYzine (ATARAX/VISTARIL) 10 MG tablet Take 1 tablet (10 mg  total) by mouth 3 (three) times daily as needed. (Patient taking differently: Take 10 mg by mouth 3 (three) times daily as needed for itching or anxiety. ) 30 tablet 3  . isosorbide mononitrate (IMDUR) 30 MG 24 hr tablet TAKE 1 TABLET (30 MG TOTAL) BY MOUTH EVERY MORNING. 30 tablet 1  . Lancets (FREESTYLE) lancets Check blood sugar four times per day. Before each meal and at bedtime 100 each 12  . levothyroxine (SYNTHROID, LEVOTHROID) 25 MCG tablet TAKE 1 TABLET (25 MCG TOTAL) BY MOUTH DAILY BEFORE BREAKFAST. 30 tablet 2  . metoprolol (LOPRESSOR) 50 MG tablet Take 1 tablet (50 mg total) by mouth 2 (two) times daily. For blood pressure 60 tablet 3  . nitroGLYCERIN (NITROSTAT) 0.4 MG SL tablet Place 1 tablet (0.4 mg total) under the tongue every 5 (five) minutes as needed for chest pain. 25 tablet 0  . omeprazole (PRILOSEC) 20 MG capsule Take 1 capsule (20 mg total) by mouth daily. For acid reflux (Patient taking differently: Take 20 mg by mouth daily as needed (acid reflux). For acid reflux) 30 capsule 3  . pravastatin (PRAVACHOL) 40 MG tablet Take 1 tablet (40 mg total) by mouth daily. 30 tablet 3  . vitamin E (VITAMIN E) 400 UNIT capsule Take 400 Units by mouth every morning.    . [DISCONTINUED] isosorbide dinitrate (ISORDIL) 30 MG tablet Take 1 tablet (30 mg total) by mouth every morning. 30 tablet 2   No current facility-administered medications for this visit.    Allergies:   Glimepiride and Sulfa antibiotics    Social History:  The patient  reports that she has never smoked. She has never used smokeless tobacco. She reports that she does not drink alcohol or use illicit drugs.   Family History:  The patient's family history includes CAD in her brother, father, mother, and sister; Cancer in her father and mother; Diabetes in her brother, father, mother, and sister; Heart failure in her father and mother; Hypertension in her brother, father, mother, and sister.    ROS:  Please see the  history of present illness.   Otherwise, review of systems are positive for none.   All other systems are reviewed and negative.    PHYSICAL EXAM: VS:  BP 142/80 mmHg  Pulse 60  Ht '5\' 4"'  (1.626 m)  Wt 153 lb (69.4 kg)  BMI 26.25 kg/m2 , BMI Body mass index is 26.25 kg/(m^2). GEN: Well nourished, well developed, in no acute distress HEENT: normal Neck: no JVD, or masses Cardiac: RRR; no murmurs, rubs, or gallops,no edema  Respiratory:  clear to auscultation bilaterally, no wheezing, rhonchi or rales. GI: soft, nontender, nondistended  MS: no deformity or atrophy Skin: warm and dry  Neuro:  Strength and sensation are intact Psych: Normal affect  Wt Readings from Last 3 Encounters:  11/26/14 153 lb 6.4 oz (69.582 kg)  11/17/14 154 lb 8.7 oz (70.1  kg)  10/24/14 158 lb 1.1 oz (71.7 kg)      EKG:  EKG is ordered today.  It demonstrates:   NSR, HR 60, normal axis, nonspecific ST-T wave changes, no significant change compared to prior tracings   Recent Labs: 07/17/2014: TSH 4.114 11/17/2014: ALT 25 11/19/2014: BUN 8; Creatinine 0.81; Potassium 4.2; Sodium 136 11/20/2014: Hemoglobin 13.6; Platelets 159    Lipid Panel    Component Value Date/Time   CHOL 133 11/18/2014 0020   TRIG 85 11/18/2014 0020   HDL 39* 11/18/2014 0020   CHOLHDL 3.4 11/18/2014 0020   VLDL 17 11/18/2014 0020   LDLCALC 77 11/18/2014 0020       ASSESSMENT AND PLAN:  1.  Coronary Artery Disease:  Patient is doing well. She denies any recurrent chest discomfort after discharge from the hospital with a non-STEMI. She continues to prefer to put off a cardiac catheterization for now. She's had extensive review of her medications today. She seems to be tolerating all medications.    -  Change aspirin from 325 to 81 mg daily.    -  Continue Plavix.     -  Continue beta blocker, nitrates.    -  Change pravastatin to high intensity statin with atorvastatin 40 mg daily.    -  Refer cardiac rehabilitation.    -  Add  ACEI with Lisinopril 5 mg QD. 2.  History of Right Brain CVA:  Stable.  FU with neuro as planned.  Continue statin, ASA, Plavix.  3.  Hypertension:  Elevated BP today.  Add Lisinopril 5 mg as noted.    -  BMET one week.  4.  Hyperlipidemia:  Change Pravastatin to Atorvastatin 40 mg QD as noted.    -  Check Lipids and LFTs in 6 weeks.   5.  Diabetes mellitus: FU with primary care. 6.  Anxiety:  She had several questions regarding Lexapro and Xanax.  She just had her Lexapro filled by her Psychiatrist.  I asked her to discuss this with him/her. 7.  Hypothyroidism:  She is changing primary care providers and requests a refill on her Synthroid.  I will refill this for 30 days and 1 refill.    Current medicines are reviewed at length with the patient today.  The patient does not have concerns regarding medicines.  The following changes have been made:  As noted above.   Labs/ tests ordered today include:  Orders Placed This Encounter  Procedures  . Hepatic function panel  . Lipid Profile  . Basic Metabolic Panel (BMET)  . EKG 12-Lead     Disposition:   FU with Dr. Candee Furbish or me  in 4 weeks   Signed, Versie Starks, MHS 11/27/2014 11:04 AM    [address], [place], [place]  [address] Phone: [phone]; Fax: [phone]

## 2014-11-27 NOTE — Patient Instructions (Addendum)
You have been referred to CARDIAC REHAB AT [facility]  Your physician recommends that you return for lab work in: 1 WEEK FOR BMET  Your physician recommends that you return for lab work in: 4-6 WEEKS FOR A FASTING LIPID AND LIVER PANEL  Your physician recommends that you schedule a follow-up appointment in: 3-4 WEEKS WITH DR. Anne FuSKAINS OR SCOTT WEAVER, PAC SAME DAY DR. Anne FuSKAINS IS IN THE OFFICE  Your physician has recommended you make the following change in your medication:  1. STOP ASPRIN 325 2. STOP PRAVASTATIN 3. STOP PRILOSEC 4. START ASPRIN 81 MG DAILY 5. START ATORVASTATIN 40 MG 1 TABLET DAILY 6. START LISINOPRIL 5 MG 1 TABLET DAILY 7. START PROTONIX 40 MG DAILY  A REFILL FOR NTG HAS BEEN SENT IN   A REFILL FOR SYNTHROID HAS BEEN SENT IN # 30 X 1 WITH FURTHER REFILLS WITH PRIMARY CARE  PER SCOTT WEAVER, PAC YOU WILL NEED TO ASK YOUR PSYCHIATRIST FOR REFILLS FOR LEXAPRO OR Prudy FeelerXANAX

## 2014-11-27 NOTE — Progress Notes (Signed)
Pharmacy Transitions of Care Post-ACS Visit  Admit Complaint: Ms. Dethlefs is a 9 yof who was discharged from [facility] on 11/21/14 after NSTEMI. PMH is significant for CAD s/p PCI in 2011, CVA in Dec 2015, DM2, fibromyalgia, IBS, HLD, hypothyroidism, and HTN. Patient presents to clinic for pharmacy medication reconciliation.  Medications and allergies were reviewed with patient, although medications were not brought to today's appointment. Pt has decent recall of medications and doses.  Current Outpatient Prescriptions  Medication Sig Dispense Refill  . ALPRAZolam (XANAX) 0.5 MG tablet Take 0.5 mg by mouth daily as needed for anxiety.    Marland Kitchen amLODipine (NORVASC) 5 MG tablet Take 1 tablet (5 mg total) by mouth daily. 30 tablet 3  . aspirin EC 81 MG tablet Take 1 tablet (81 mg total) by mouth daily.    Marland Kitchen atorvastatin (LIPITOR) 40 MG tablet Take 1 tablet (40 mg total) by mouth daily. 30 tablet 11  . busPIRone (BUSPAR) 10 MG tablet Take 10 mg by mouth 2 (two) times daily.    . clopidogrel (PLAVIX) 75 MG tablet Take 1 tablet (75 mg total) by mouth daily with breakfast. 30 tablet 6  . escitalopram (LEXAPRO) 10 MG tablet Take 1 tablet (10 mg total) by mouth daily. 30 tablet 0  . glimepiride (AMARYL) 2 MG tablet Take 1 tablet (2 mg total) by mouth 2 (two) times daily. 60 tablet 3  . glucose blood test strip Check blood sugar before each meal and at bedtime total 4 times per day 100 each 12  . glucose monitoring kit (FREESTYLE) monitoring kit Test blood sugars 3 times per day as prescribed 1 each 0  . hydrOXYzine (ATARAX/VISTARIL) 10 MG tablet Take 1 tablet (10 mg total) by mouth 3 (three) times daily as needed. (Patient taking differently: Take 10 mg by mouth 3 (three) times daily as needed for itching or anxiety. ) 30 tablet 3  . isosorbide mononitrate (IMDUR) 30 MG 24 hr tablet TAKE 1 TABLET (30 MG TOTAL) BY MOUTH EVERY MORNING. 30 tablet 1  . Lancets (FREESTYLE) lancets Check blood sugar four times per day.  Before each meal and at bedtime 100 each 12  . levothyroxine (SYNTHROID, LEVOTHROID) 25 MCG tablet TAKE 1 TABLET (25 MCG TOTAL) BY MOUTH DAILY BEFORE BREAKFAST. 30 tablet 1  . lisinopril (PRINIVIL,ZESTRIL) 5 MG tablet Take 1 tablet (5 mg total) by mouth daily. 30 tablet 11  . metoprolol (LOPRESSOR) 50 MG tablet Take 1 tablet (50 mg total) by mouth 2 (two) times daily. For blood pressure 60 tablet 3  . nitroGLYCERIN (NITROSTAT) 0.4 MG SL tablet Place 1 tablet (0.4 mg total) under the tongue every 5 (five) minutes as needed. 25 tablet 3  . pantoprazole (PROTONIX) 40 MG tablet Take 1 tablet (40 mg total) by mouth daily. 30 tablet 11  . vitamin E (VITAMIN E) 400 UNIT capsule Take 400 Units by mouth every morning.    . [DISCONTINUED] isosorbide dinitrate (ISORDIL) 30 MG tablet Take 1 tablet (30 mg total) by mouth every morning. 30 tablet 2   No current facility-administered medications for this visit.    ACS Medication Checklist:     Medication      YES      NO N/A or C/I and Explanation  Beta Blocker [x] []   ACEi or ARB [] [x] Not prescribed on discharge and no noted contraindications to ACEi. Pt thinks she may have been on an ACEi in the past and could have had a reaction, but  she doesn't remember. Is willing to start taking again.   High-dose statin    (atorvastatin 40 or 71m, rosuvastatin 20 or 496m [] [x] Pt discharged on pravastatin 4034maily. Lipid panel ok with LDL <100 but should be on high dose statin for 50% reduction in LDL after ACS event.  Clopidogrel +/- other         anti-platelets [x]        []  Plavix  Aspirin [x] []   Nitroglycerin [x] []    Smoking cessation counseling provided [] [] Pt does not smoke  Cardiac rehab referral []        [x] No contraindication for this    Assessment/Plan: refer to chart below.    Interventions during today's visit include: Intervention     YES      NO  Explanation   Indications      Needs Therapy [x] [] Pt not discharged on an ACEi.    Unnecessary Therapy [] [x]   Efficacy     Suboptimal Drug Selection [x] [] Discharged on moderate instead of high intensity statin.  Insufficient Dose/Duration [] [x]    Safety      Adverse Drug Event [x] [] Glimepiride listed under drug allergy (exacerbates IBS symptoms) and also on medication list. Pt states she takes this BID and does not have any problems--removed from med allergy.   Drug Interaction [x] [] Pt discharged on omeprazole and Plavix. Omeprazole decreases the efficacy of Plavix. Pt reports she has been taking omeprazole daily. Should be switched to pantoprazole or another PPI. Pt asked if she could still take Tums for prn symptoms, informed her this was fine.   Excessive Dose/Duration [] [x]   Compliance     Underuse [] [x]    Overuse [x] [] Pt reports taking 325m59mA daily rather than 81mg71mly. Informed her to reduce this dose to 81mg 41my.   Pt states that she needs refills on her Lexapro, Xanax, NTG, and levothyroxine. Pt reports that Lexapro and Xanax were new prescriptions when she was discharged from the hospital. She also brings in a new prescription for Buspar today as well for anxiety. Will need f/u with psych regarding these medications.  Relayed above interventions with Dr. WeaverKathlen Modyoved and made changes including:  - Reduce ASA from 325mg t52mmg da25m- PPI changed from omeprazole to pantoprazole to avoid interaction with Plavix - Started ACEi (lisinopril 5mg dail65m- Changed pravastatin to high intensity statin (atorvastatin 40mg) - M40mcardiac rehab referral   Will call patient in 1 month for follow-up for medication adherence assessment.  Megan E. Supple, Pharm.D Clinical Pharmacy Resident Pager: [phone] 3:10 PM

## 2014-11-29 ENCOUNTER — Ambulatory Visit: Payer: Medicare Other

## 2014-12-04 ENCOUNTER — Other Ambulatory Visit: Payer: Medicare Other

## 2014-12-06 ENCOUNTER — Ambulatory Visit: Payer: Medicare Other

## 2014-12-07 ENCOUNTER — Encounter: Payer: Self-pay | Admitting: Nurse Practitioner

## 2014-12-07 ENCOUNTER — Other Ambulatory Visit (INDEPENDENT_AMBULATORY_CARE_PROVIDER_SITE_OTHER): Payer: Medicare Other | Admitting: *Deleted

## 2014-12-07 ENCOUNTER — Ambulatory Visit (INDEPENDENT_AMBULATORY_CARE_PROVIDER_SITE_OTHER): Payer: Medicare Other | Admitting: Nurse Practitioner

## 2014-12-07 VITALS — BP 148/76 | HR 46 | Ht 64.0 in | Wt 154.8 lb

## 2014-12-07 DIAGNOSIS — I10 Essential (primary) hypertension: Secondary | ICD-10-CM

## 2014-12-07 DIAGNOSIS — I6330 Cerebral infarction due to thrombosis of unspecified cerebral artery: Secondary | ICD-10-CM

## 2014-12-07 DIAGNOSIS — E785 Hyperlipidemia, unspecified: Secondary | ICD-10-CM

## 2014-12-07 DIAGNOSIS — K589 Irritable bowel syndrome without diarrhea: Secondary | ICD-10-CM

## 2014-12-07 DIAGNOSIS — R001 Bradycardia, unspecified: Secondary | ICD-10-CM

## 2014-12-07 DIAGNOSIS — I2510 Atherosclerotic heart disease of native coronary artery without angina pectoris: Secondary | ICD-10-CM

## 2014-12-07 DIAGNOSIS — Z9861 Coronary angioplasty status: Secondary | ICD-10-CM

## 2014-12-07 LAB — BASIC METABOLIC PANEL
BUN: 8 mg/dL (ref 6–23)
CO2: 27 mEq/L (ref 19–32)
Calcium: 9 mg/dL (ref 8.4–10.5)
Chloride: 104 mEq/L (ref 96–112)
Creatinine, Ser: 0.84 mg/dL (ref 0.40–1.20)
GFR: 72.19 mL/min (ref >60.00)
Glucose, Bld: 220 mg/dL — ABNORMAL HIGH (ref 70–99)
POTASSIUM: 3.8 meq/L (ref 3.5–5.1)
SODIUM: 138 meq/L (ref 135–145)

## 2014-12-07 MED ORDER — METOPROLOL TARTRATE 25 MG PO TABS: 25.0000 mg | ORAL_TABLET | Freq: Two times a day (BID) | ORAL | Status: DC

## 2014-12-07 NOTE — Progress Notes (Deleted)
Cardiology Office Note   Date:  12/07/2014   ID:  Shannon [name], DOB [birth year], MRN [id number]  PCP:  Holland Commons, NP  Cardiologist:  Dr. Thurmon Fair, PA-C   Chief Complaint  Patient presents with  . Shortness of Breath    numbness in both arms for 2 days  . Fatigue    weakness began today  . Bradycardia    History of Present Illness: Shannon [name] is a 64 y.o. female who presents for SOB, weakness and bradycardia.   She has a history of CAD status post prior DES to the LAD in 2011, recurrent chest pain, HTN, DM2, fibromyalgia, depression, IBS.   Admitted 12/15-12/18 with acute right brain CVA. Carotid Dopplers demonstrated no significant ICA stenosis and echocardiogram continued to demonstrate normal LV function.  Admitted 1/9-1/12 with a non-STEMI, peak troponin 1.06. Cardiac catheterization was recommended. Neurology was consulted. Repeat MRI was performed. It was felt that it was acceptable to proceed with cardiac catheterization from a neurologic standpoint. However, the patient preferred to decline proceeding with cardiac catheterization due to risk of further stroke. She completed 48 hours of IV heparin. She was discharged on aspirin, Plavix, statin, nitrates and beta blocker. Dr. Anne Fu recommended proceeding with cardiac catheterization if anginal symptoms worsen.  01/19, seen in office, doing well, lisinopril 5 mg added. Uses a walker, doing PT w/out chest pain or SOB.  01/29, office visit initiated after she came in for lab work and was complaining of weakness, DOE and low HR.  Pt had IBS flair starting 4 days ago with diarrhea and nausea, no vomiting, no blood in diarrhea. Also has a dry cough, felt like she had a fever for a brief time a few days ago, but this resolved. About the same time, she had weakness and increased DOE. No chest pain. She has intermittent numbness in her left foot and arm, reminiscent of her CVA. The symptoms resolve  without intervention.   She has had bradycardia, noting that her heart rate was consistently in the 50s when checked at home. Today it was in the 40s and her symptoms were concerning for her, so she requested evaluation when she came for blood work. She had an episode of diarrhea in the office, and has some changes in sensation in her right foot, but is currently not SOB or lightheaded.   Past Medical History  Diagnosis Date  . Hypertension   . Coronary artery disease     a. s/p PCI with DES to LAD in 2011  (LHC 03/2010: Mid LAD 70%, normal ejection fraction  >> PCI:  2.75 x 15 mm Promus DES to LAD); b. Nuclear Stress Test 05/29/14: No ischemia, EF 59%, Normal; c. NSTEMI 11/2014 >> patient declined LHC b/c of recent CVA  . Diabetes mellitus without complication   . Arthritis   . Irritable bowel   . Anxiety   . Depression   . Fibromyalgia 1995  . Hx of echocardiogram     a. Echocardiogram 10/25/14: Moderate LVH, EF 50-55%, normal wall motion, grade 2 diastolic dysfunction, mild LAE, atrial septal lipomatous hypertrophy  . H/O ischemic right PCA stroke 10/2014    a. Carotid US 10/24/14: No significant ICA stenosis bilaterally    Past Surgical History  Procedure Laterality Date  . Coronary stent placement    . Cholecystectomy    . Orif arm fracture Right     forearm   Current Outpatient Prescriptions  Medication Sig Dispense Refill  .  amLODipine (NORVASC) 5 MG tablet Take 1 tablet (5 mg total) by mouth daily. 30 tablet 3  . aspirin EC 81 MG tablet Take 1 tablet (81 mg total) by mouth daily.    Marland Kitchen. atorvastatin (LIPITOR) 40 MG tablet Take 1 tablet (40 mg total) by mouth daily. 30 tablet 11  . busPIRone (BUSPAR) 10 MG tablet Take 10 mg by mouth 2 (two) times daily.    . clopidogrel (PLAVIX) 75 MG tablet Take 1 tablet (75 mg total) by mouth daily with breakfast. 30 tablet 6  . glimepiride (AMARYL) 2 MG tablet Take 1 tablet (2 mg total) by mouth 2 (two) times daily. 60 tablet 3  . glucose  blood test strip Check blood sugar before each meal and at bedtime total 4 times per day 100 each 12  . hydrOXYzine (ATARAX/VISTARIL) 10 MG tablet Take 10 mg by mouth 3 (three) times daily as needed for anxiety (take one tablet three times daily as needed for anxiety).    . isosorbide mononitrate (IMDUR) 30 MG 24 hr tablet TAKE 1 TABLET (30 MG TOTAL) BY MOUTH EVERY MORNING. 30 tablet 1  . Lancets (FREESTYLE) lancets Check blood sugar four times per day. Before each meal and at bedtime 100 each 12  . levothyroxine (SYNTHROID, LEVOTHROID) 25 MCG tablet TAKE 1 TABLET (25 MCG TOTAL) BY MOUTH DAILY BEFORE BREAKFAST. 30 tablet 1  . lisinopril (PRINIVIL,ZESTRIL) 5 MG tablet Take 1 tablet (5 mg total) by mouth daily. 30 tablet 11  . metoprolol (LOPRESSOR) 50 MG tablet Take 1 tablet (50 mg total) by mouth 2 (two) times daily. For blood pressure 60 tablet 3  . nitroGLYCERIN (NITROSTAT) 0.4 MG SL tablet Place 1 tablet (0.4 mg total) under the tongue every 5 (five) minutes as needed. 25 tablet 3  . pantoprazole (PROTONIX) 40 MG tablet Take 1 tablet (40 mg total) by mouth daily. 30 tablet 11  . vitamin E (VITAMIN E) 400 UNIT capsule Take 400 Units by mouth every morning.    . [DISCONTINUED] isosorbide dinitrate (ISORDIL) 30 MG tablet Take 1 tablet (30 mg total) by mouth every morning. 30 tablet 2   No current facility-administered medications for this visit.   Allergies:   Sulfa antibiotics   Social History:  The patient  reports that she has never smoked. She has never used smokeless tobacco. She reports that she does not drink alcohol or use illicit drugs.   Family History:  The patient's family history includes CAD in her brother, father, mother, and sister; Cancer in her father and mother; Diabetes in her brother, father, mother, and sister; Heart attack in her brother, father, mother, and sister; Heart failure in her father and mother; Hypertension in her brother, father, mother, and sister; Stroke in her  father.    ROS:  Please see the history of present illness.   Otherwise, review of systems are positive for none.   All other systems are reviewed and negative.    PHYSICAL EXAM: VS:  BP 148/76 mmHg  Pulse 46  Ht 5\' 4"  (1.626 m)  Wt 154 lb 12.8 oz (70.217 kg)  BMI 26.56 kg/m2 , BMI Body mass index is 26.56 kg/(m^2). GEN: Well nourished, well developed, in no acute distress HEENT: normal Neck: no JVD, carotid bruits, or masses Cardiac: RRR; faint murmurs, rubs, or gallops,no edema  Respiratory:  clear to auscultation bilaterally, normal work of breathing GI: soft, diffusely tender, no guarding, nondistended, + BS MS: no deformity or atrophy Skin: warm and dry,  no rash Neuro:  Strength and sensation are intact Psych: euthymic mood, full affect  EKG:  EKG is ordered today. The ekg ordered today demonstrates sinus bradycardia, rate 46, ?U wave, otherwise minimal change from 11/18/2014  Recent Labs: 07/17/2014: TSH 4.114 11/17/2014: ALT 25 11/19/2014: BUN 8; Creatinine 0.81; Potassium 4.2; Sodium 136 11/20/2014: Hemoglobin 13.6; Platelets 159    Lipid Panel    Component Value Date/Time   CHOL 133 11/18/2014 0020   TRIG 85 11/18/2014 0020   HDL 39* 11/18/2014 0020   CHOLHDL 3.4 11/18/2014 0020   VLDL 17 11/18/2014 0020   LDLCALC 77 11/18/2014 0020     Wt Readings from Last 3 Encounters:  12/07/14 154 lb 12.8 oz (70.217 kg)  11/27/14 153 lb (69.4 kg)  11/26/14 153 lb 6.4 oz (69.582 kg)     Other studies Reviewed: - LHC 03/2010: Mid LAD 70%, normal ejection fraction >> PCI: 2.75 x 15 mm Promus DES to LAD - Nuclear Stress Test 05/29/14: No ischemia, EF 59%, Normal - Carotid US 10/24/14: No significant ICA stenosis bilaterally  - Echocardiogram 10/25/14: Moderate LVH, EF 50-55%, normal wall motion, grade 2 diastolic dysfunction, mild LAE, atrial septal lipomatous hypertrophy  ASSESSMENT AND PLAN:  1. Bradycardia - no presyncope or syncope, but weakness and DOE may be  related. Pt believes amlodipine causing lower HR, but metoprolol far more likely.  Also pt HR did not increase significantly with ambulation (up to the point she felt weak and SOB). She therefore agrees to decrease metoprolol to 1/2 tab (25 mg) bid, continue other meds. Follow BP/HR at home, update [place] by phone and keep f/u appt with [facility] in Feb.  2. CAD S/P percutaneous coronary angioplasty - no acute ischemic symptoms, continue ASA, Plavix, statin, and BB (as HR will allow).  3. IBS (irritable bowel syndrome) - episodic diarrhea is normal for her, Immodium generally works, OK to take.  Current medicines are reviewed at length with the patient today.  The patient has concerns regarding medicines.  The following changes have been made:  Decrease metoprolol to 25 mg BID  Labs/ tests ordered today include:  No orders of the defined types were placed in this encounter.    Disposition:   FU with Dr. Raelyn Ensign, PA-C as scheduled  Signed, Theodore Demark, PA-C  12/07/2014 3:38 PM    [facility] [address] [place], [place], [place]  [address] Phone: [phone]; Fax: [phone]

## 2014-12-07 NOTE — Progress Notes (Signed)
Cardiology Office Note  Date: 12/07/2014  ID: Shannon [name], DOB 02/06/1951, MRN [id number]  PCP: Holland Commons, NP  Cardiologist: Dr. Anne Fu    Chief Complaint   Patient presents with   .  Fatigue     weakness began today   .  Bradycardia    History of Present Illness:   Shannon [name] is a 64 y.o. female who presents for SOB, weakness and bradycardia.  She has a history of CAD status post prior DES to the LAD in 2011, recurrent chest pain, HTN, DM2, fibromyalgia, depression, IBS.  Admitted 12/15-12/18 with acute right brain CVA. Carotid Dopplers demonstrated no significant ICA stenosis and echocardiogram continued to demonstrate normal LV function.  Admitted 1/9-1/12 with a non-STEMI, peak troponin 1.06. Cardiac catheterization was recommended. Neurology was consulted. Repeat MRI was performed. It was felt that it was acceptable to proceed with cardiac catheterization from a neurologic standpoint. However, the patient preferred to decline proceeding with cardiac catheterization due to risk of further stroke. She completed 48 hours of IV heparin. She was discharged on aspirin, Plavix, statin, nitrates and beta blocker. Dr. Anne Fu recommended proceeding with cardiac catheterization if anginal symptoms worsen.  01/19, seen in office, doing well, lisinopril 5 mg added. Uses a walker, doing PT w/out chest pain or SOB.  01/29, office visit initiated after she came in for lab work and was complaining of weakness, DOE and low HR.  Pt had IBS flair starting 4 days ago with diarrhea and nausea, no vomiting, no blood in diarrhea. Also has a dry cough, felt like she had a fever for a brief time a few days ago, but this resolved. About the same time, she had weakness and increased DOE. No chest pain. She has intermittent numbness in her left foot and arm, reminiscent of her CVA. The symptoms resolve without intervention.  She has had bradycardia, noting that her heart rate was consistently in the  50s when checked at home. Today it was in the 40s and her symptoms were concerning for her, so she requested evaluation when she came for blood work. She had an episode of diarrhea in the office, and has some changes in sensation in her right foot, but is currently not SOB or lightheaded.   Past Medical History   Diagnosis  Date   .  Hypertension    .  Coronary artery disease      a. s/p PCI with DES to LAD in 2011 (LHC 03/2010: Mid LAD 70%, normal ejection fraction >> PCI: 2.75 x 15 mm Promus DES to LAD); b. Nuclear Stress Test 05/29/14: No ischemia, EF 59%, Normal; c. NSTEMI 11/2014 >> patient declined LHC b/c of recent CVA   .  Diabetes mellitus without complication    .  Arthritis    .  Irritable bowel    .  Anxiety    .  Depression    .  Fibromyalgia  1995   .  Hx of echocardiogram      a. Echocardiogram 10/25/14: Moderate LVH, EF 50-55%, normal wall motion, grade 2 diastolic dysfunction, mild LAE, atrial septal lipomatous hypertrophy   .  H/O ischemic right PCA stroke  10/2014     a. Carotid US 10/24/14: No significant ICA stenosis bilaterally    Past Surgical History   Procedure  Laterality  Date   .  Coronary stent placement     .  Cholecystectomy     .  Orif arm fracture  Right  forearm    Current Outpatient Prescriptions   Medication  Sig  Dispense  Refill   .  amLODipine (NORVASC) 5 MG tablet  Take 1 tablet (5 mg total) by mouth daily.  30 tablet  3   .  aspirin EC 81 MG tablet  Take 1 tablet (81 mg total) by mouth daily.     Marland Kitchen.  atorvastatin (LIPITOR) 40 MG tablet  Take 1 tablet (40 mg total) by mouth daily.  30 tablet  11   .  busPIRone (BUSPAR) 10 MG tablet  Take 10 mg by mouth 2 (two) times daily.     .  clopidogrel (PLAVIX) 75 MG tablet  Take 1 tablet (75 mg total) by mouth daily with breakfast.  30 tablet  6   .  glimepiride (AMARYL) 2 MG tablet  Take 1 tablet (2 mg total) by mouth 2 (two) times daily.  60 tablet  3   .  glucose blood test strip  Check blood sugar  before each meal and at bedtime total 4 times per day  100 each  12   .  hydrOXYzine (ATARAX/VISTARIL) 10 MG tablet  Take 10 mg by mouth 3 (three) times daily as needed for anxiety (take one tablet three times daily as needed for anxiety).     .  isosorbide mononitrate (IMDUR) 30 MG 24 hr tablet  TAKE 1 TABLET (30 MG TOTAL) BY MOUTH EVERY MORNING.  30 tablet  1   .  Lancets (FREESTYLE) lancets  Check blood sugar four times per day. Before each meal and at bedtime  100 each  12   .  levothyroxine (SYNTHROID, LEVOTHROID) 25 MCG tablet  TAKE 1 TABLET (25 MCG TOTAL) BY MOUTH DAILY BEFORE BREAKFAST.  30 tablet  1   .  lisinopril (PRINIVIL,ZESTRIL) 5 MG tablet  Take 1 tablet (5 mg total) by mouth daily.  30 tablet  11   .  metoprolol (LOPRESSOR) 50 MG tablet  Take 1 tablet (50 mg total) by mouth 2 (two) times daily. For blood pressure  60 tablet  3   .  nitroGLYCERIN (NITROSTAT) 0.4 MG SL tablet  Place 1 tablet (0.4 mg total) under the tongue every 5 (five) minutes as needed.  25 tablet  3   .  pantoprazole (PROTONIX) 40 MG tablet  Take 1 tablet (40 mg total) by mouth daily.  30 tablet  11   .  vitamin E (VITAMIN E) 400 UNIT capsule  Take 400 Units by mouth every morning.     .  [DISCONTINUED] isosorbide dinitrate (ISORDIL) 30 MG tablet  Take 1 tablet (30 mg total) by mouth every morning.  30 tablet  2    No current facility-administered medications for this visit.    Allergies: Sulfa antibiotics   ROS: Please see the history of present illness. Otherwise, review of systems are positive for none. All other systems are reviewed and negative.   PHYSICAL EXAM:  VS: BP 148/76 mmHg  Pulse 46  Ht 5\' 4"  (1.626 m)  Wt 154 lb 12.8 oz (70.217 kg)  BMI 26.56 kg/m2 , BMI Body mass index is 26.56 kg/(m^2).  GEN: Well nourished, well developed, in no acute distress  HEENT: normal  Neck: no JVD, carotid bruits, or masses  Cardiac: RRR; faint murmurs, rubs, or gallops,no edema  Respiratory: clear to  auscultation bilaterally, normal work of breathing  GI: soft, diffusely tender, no guarding, nondistended, + BS  MS: no deformity or atrophy  Skin: warm  and dry, no rash  Neuro: Strength and sensation are intact  Psych: euthymic mood, full affect   EKG:  EKG is ordered today.  The ekg ordered today demonstrates sinus bradycardia, rate 46, U wave, otherwise minimal change from 11/18/2014   Recent Labs:   07/17/2014: TSH 4.114  11/17/2014: ALT 25  11/19/2014: BUN 8; Creatinine 0.81; Potassium 4.2; Sodium 136  11/20/2014: Hemoglobin 13.6; Platelets 159   Lipid Panel   Labs (Brief)      Component  Value  Date/Time     CHOL  133  11/18/2014 0020     TRIG  85  11/18/2014 0020     HDL  39*  11/18/2014 0020     CHOLHDL  3.4  11/18/2014 0020     VLDL  17  11/18/2014 0020     LDLCALC  77  11/18/2014 0020    Wt Readings from Last 3 Encounters:   12/07/14  154 lb 12.8 oz (70.217 kg)   11/27/14  153 lb (69.4 kg)   11/26/14  153 lb 6.4 oz (69.582 kg)    Other studies Reviewed:   - LHC 03/2010: Mid LAD 70%, normal ejection fraction >> PCI: 2.75 x 15 mm Promus DES to LAD  - Nuclear Stress Test 05/29/14: No ischemia, EF 59%, Normal  - Carotid US 10/24/14: No significant ICA stenosis bilaterally  - Echocardiogram 10/25/14: Moderate LVH, EF 50-55%, normal wall motion, grade 2 diastolic dysfunction, mild LAE, atrial septal lipomatous hypertrophy   ASSESSMENT AND PLAN:   1. Bradycardia - no presyncope or syncope, but weakness and DOE may be related. Pt believes amlodipine causing lower HR, but metoprolol far more likely. HR only rose to 56 with ambulation in the office.  She therefore agrees to decrease metoprolol to 1/2 tab (25 mg) bid, continue other meds.  Labs pending today. Follow BP/HR at home, update [place] by phone and keep f/u appt with Dr Anne Fu in Feb.   2. CAD S/P percutaneous coronary angioplasty - no acute ischemic symptoms, continue ASA, Plavix, statin, and BB (as HR will allow).   3.  IBS (irritable bowel syndrome) - episodic diarrhea is normal for her, Immodium generally works, OK to take.   4.  HL:  Cont statin therapy.  LDL 77 w/ nl LFT's earlier this month.  5.  HTN:  BP mildly elevated today.  She will follow @ home in setting of cutting down bb. Cont bb/acei.  Labs pending today.  Current medicines are reviewed at length with the patient today. The patient has concerns regarding medicines.  The following changes have been made: Decrease metoprolol to 25 mg BID  Labs/ tests ordered today include:  No orders of the defined types were placed in this encounter.   Disposition: FU with Dr. Anne Fu as scheduled  Nicolasa Ducking, NP 12/07/2014, 4:39 PM

## 2014-12-07 NOTE — Patient Instructions (Addendum)
Decrease metoprolol to 1/2 tablet (25 mg) twice a day.   Track heart rate and blood pressure at varying times of day. Report heart rates less than 40 immediately.  OK to take Immodium or Robitussin DM as needed for diarrhea or cough.

## 2014-12-10 ENCOUNTER — Telehealth: Payer: Self-pay | Admitting: *Deleted

## 2014-12-10 NOTE — Telephone Encounter (Signed)
pt and her friend Jolayne HainesDonna DPR on file for have been notified about lab results with verbal understanding. Pt asked if she was to continue on metoprolol 25 mg bid as stated 1/29 w/Chris Sharlene MottsBerg, NP. I advise yes to continue current treatment plan. pt said ok.

## 2014-12-13 ENCOUNTER — Ambulatory Visit: Payer: Medicare Other

## 2014-12-19 ENCOUNTER — Telehealth: Payer: Self-pay | Admitting: Neurology

## 2014-12-19 NOTE — Telephone Encounter (Signed)
Maralyn [facility]agoSarah [facility] @ [phone], stated patient is experiencing intermittent tingling and numbness in both arms since having stroke.  Questioning if patient needs to be seen?  Please call and advise.

## 2014-12-19 NOTE — Telephone Encounter (Signed)
[facility] and she relayed the other day the patient complained of numbness in left foot.  Today the patient said she had numbness in both upper arms on Thursday, Saturday and Sunday, with some scalp and neck pain.  She said patient was very tearful today with family issues, also worried that she may have had a TIA.    I spoke to patient and she thinks she may have had a "mini" stroke.  She has no symptoms today, I relayed to call [phone] if she feels any numbness coming on.  I will forward to physician for advice and follow up appointment if needed.

## 2014-12-19 NOTE — Telephone Encounter (Signed)
The patient has had 3 episodes of numbness of the arms and legs that may include the entirety the extremity, come on bilaterally, lasting about 10 minutes. No other associated symptoms of weakness, vision change, slurred speech, difficulty walking. The patient is having some headaches. The sedimentation rate was 6. I'm not clear that the above events represent TIAs. The patient does have atherosclerotic changes throughout the intracranial circulation, however. I will try to get patient in sometime within the next several weeks for another revisit.

## 2014-12-20 ENCOUNTER — Telehealth: Payer: Self-pay | Admitting: Neurology

## 2014-12-20 NOTE — Telephone Encounter (Signed)
Spoke to patient and she is scheduled for 12-24-14 at 1030.

## 2014-12-20 NOTE — Telephone Encounter (Signed)
I received  a VM. Returning a call to you. Please call [phone]. dg

## 2014-12-20 NOTE — Telephone Encounter (Signed)
Spoke to patient and she is scheduled for 12-24-14 at 1030. 

## 2014-12-20 NOTE — Telephone Encounter (Signed)
Left message for patient to call back so I can schedule a follow up appointment with Dr. Anne HahnWillis.

## 2014-12-25 ENCOUNTER — Telehealth: Payer: Self-pay | Admitting: Neurology

## 2014-12-25 ENCOUNTER — Ambulatory Visit: Payer: Self-pay | Admitting: Neurology

## 2014-12-25 NOTE — Telephone Encounter (Signed)
This patient did not show for an urgent work in revisit appointment today.

## 2014-12-26 ENCOUNTER — Ambulatory Visit: Payer: Medicare Other | Admitting: Neurology

## 2014-12-26 ENCOUNTER — Encounter: Payer: Self-pay | Admitting: Neurology

## 2014-12-26 ENCOUNTER — Telehealth: Payer: Self-pay | Admitting: Neurology

## 2014-12-26 NOTE — Telephone Encounter (Signed)
This is the second no-show for an urgent revisit appointment.

## 2014-12-27 ENCOUNTER — Ambulatory Visit (INDEPENDENT_AMBULATORY_CARE_PROVIDER_SITE_OTHER): Payer: Medicare Other | Admitting: Neurology

## 2014-12-27 ENCOUNTER — Encounter: Payer: Self-pay | Admitting: Neurology

## 2014-12-27 ENCOUNTER — Telehealth: Payer: Self-pay | Admitting: Neurology

## 2014-12-27 VITALS — BP 158/83 | HR 71 | Ht 63.0 in | Wt 153.8 lb

## 2014-12-27 DIAGNOSIS — E538 Deficiency of other specified B group vitamins: Secondary | ICD-10-CM | POA: Diagnosis not present

## 2014-12-27 DIAGNOSIS — R200 Anesthesia of skin: Secondary | ICD-10-CM | POA: Diagnosis not present

## 2014-12-27 DIAGNOSIS — I6330 Cerebral infarction due to thrombosis of unspecified cerebral artery: Secondary | ICD-10-CM | POA: Diagnosis not present

## 2014-12-27 NOTE — Telephone Encounter (Signed)
Pt is calling wishing to be seen asap. She missed her appt yesterday because she was late.  She is having numbness in the left arm and heaviness.  Please call and advise.

## 2014-12-27 NOTE — Telephone Encounter (Signed)
The patient was seen today for another revisit.

## 2014-12-27 NOTE — Telephone Encounter (Signed)
Spoke to patient and have her coming in at 4pm today.  Told to call if her ride can't take her.

## 2014-12-27 NOTE — Progress Notes (Signed)
Reason for visit: Cerebrovascular disease  Shannon [name] is an 64 y.o. 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 The patient has been on aspirin and Plavix. 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 Over the last several weeks, she has noted intermittent episodes of migratory sensory alteration that may affect the arms together, one arm or the other, or one leg or the other. The she currently is complaining of sensory alteration in the left arm. Both feet may be affected, occasionally she will have some sensory symptoms around the mouth. She denies any weakness, or change in vision. She has not had any slurred speech or difficulty swallowing. She denies any change in balance. She is concerned about these changes, she comes to the office today for an evaluation.  Past Medical History  Diagnosis Date  . Hypertension   . Coronary artery disease     a. s/p PCI with DES to LAD in 2011  (LHC 03/2010: Mid LAD 70%, normal ejection fraction  >> PCI:  2.75 x 15 mm Promus DES to LAD); b. Nuclear Stress Test 05/29/14: No ischemia, EF 59%, Normal; c. NSTEMI 11/2014 >> patient declined LHC b/c of recent CVA  . Diabetes mellitus without complication   . Arthritis   . Irritable bowel   . Anxiety   . Depression   . Fibromyalgia 1995  . Hx of echocardiogram     a. Echocardiogram 10/25/14: Moderate LVH, EF 50-55%, normal wall motion, grade 2 diastolic dysfunction, mild LAE, atrial septal lipomatous hypertrophy  . H/O ischemic right PCA stroke 10/2014    a. Carotid US 10/24/14: No significant ICA stenosis bilaterally  . Dyslipidemia 11/20/2014    Past Surgical History  Procedure Laterality Date  . Coronary stent placement    .  Cholecystectomy    . Orif arm fracture Right     forearm    Family History  Problem Relation Age of Onset  . Cancer Mother   . Diabetes Mother   . Hypertension Mother   . Heart failure Mother   . CAD Mother   . Cancer Father   . Diabetes Father   . Hypertension Father   . Heart failure Father   . CAD Father   . CAD Brother   . Diabetes Brother   . Hypertension Brother   . CAD Sister   . Diabetes Sister   . Hypertension Sister   . Heart attack Father   . Heart attack Mother   . Heart attack Sister   . Heart attack Brother   . Stroke Father     Social history:  reports that she has never smoked. She has never used smokeless tobacco. She reports that she does not drink alcohol or use illicit drugs.    Allergies  Allergen Reactions  . Sulfa Antibiotics Other (See Comments)    Childhood allergy, can't remember    Medications:  Current Outpatient Prescriptions on File Prior to Visit  Medication Sig Dispense Refill  . amLODipine (NORVASC) 5 MG tablet Take 1 tablet (5 mg total) by mouth daily. 30 tablet 3  . aspirin EC 81 MG tablet Take 1 tablet (81 mg total) by mouth daily.    Marland Kitchen atorvastatin (LIPITOR) 40 MG tablet Take 1 tablet (40 mg total)  by mouth daily. 30 tablet 11  . clopidogrel (PLAVIX) 75 MG tablet Take 1 tablet (75 mg total) by mouth daily with breakfast. 30 tablet 6  . glimepiride (AMARYL) 2 MG tablet Take 1 tablet (2 mg total) by mouth 2 (two) times daily. 60 tablet 3  . glucose blood test strip Check blood sugar before each meal and at bedtime total 4 times per day 100 each 12  . hydrOXYzine (ATARAX/VISTARIL) 10 MG tablet Take 10 mg by mouth 3 (three) times daily as needed for anxiety (take one tablet three times daily as needed for anxiety).    . isosorbide mononitrate (IMDUR) 30 MG 24 hr tablet TAKE 1 TABLET (30 MG TOTAL) BY MOUTH EVERY MORNING. 30 tablet 1  . Lancets (FREESTYLE) lancets Check blood sugar four times per day. Before each meal and at bedtime 100  each 12  . levothyroxine (SYNTHROID, LEVOTHROID) 25 MCG tablet TAKE 1 TABLET (25 MCG TOTAL) BY MOUTH DAILY BEFORE BREAKFAST. 30 tablet 1  . lisinopril (PRINIVIL,ZESTRIL) 5 MG tablet Take 1 tablet (5 mg total) by mouth daily. 30 tablet 11  . metoprolol tartrate (LOPRESSOR) 25 MG tablet Take 1 tablet (25 mg total) by mouth 2 (two) times daily. 180 tablet 3  . nitroGLYCERIN (NITROSTAT) 0.4 MG SL tablet Place 1 tablet (0.4 mg total) under the tongue every 5 (five) minutes as needed. 25 tablet 3  . pantoprazole (PROTONIX) 40 MG tablet Take 1 tablet (40 mg total) by mouth daily. 30 tablet 11  . vitamin E (VITAMIN E) 400 UNIT capsule Take 400 Units by mouth every morning.    . [DISCONTINUED] isosorbide dinitrate (ISORDIL) 30 MG tablet Take 1 tablet (30 mg total) by mouth every morning. 30 tablet 2   No current facility-administered medications on file prior to visit.    ROS:  Out of a complete 14 system review of symptoms, the patient complains only of the following symptoms, and all other reviewed systems are negative.  Loss of vision, blurred vision Cough, chest tightness Insomnia Back pain Dizziness, numbness, weakness Hot sensations  Blood pressure 158/83, pulse 71, height  (1.6 m), weight 153 lb 12.8 oz (69.763 kg).  Physical Exam  General: The patient is alert and cooperative at the time of the examination.  Skin: No significant peripheral edema is noted.   Neurologic Exam  Mental status: The patient is oriented x 3.  Cranial nerves: Facial symmetry is present. Speech is normal, no aphasia or dysarthria is noted. Extraocular movements are full. Visual fields are notable for a dense left homonymous visual field deficit.  Motor: The patient has good strength in all 4 extremities.  Sensory examination: Soft touch sensation is decreased on the left arm relative to the right, symmetric in the legs.  Coordination: The patient has good finger-nose-finger and heel-to-shin  bilaterally.  Gait and station: The patient has a normal gait. Tandem gait is slightly unsteady. Romberg is negative. No drift is seen.  Reflexes: Deep tendon reflexes are symmetric.   Assessment/Plan:  1. Cerebrovascular disease, right posterior cerebral artery distribution stroke  2. Migratory sensory alterations  The patient is having episodes of numbness involving the arms and legs that may alternate from one side to the next, lasting 10 minutes to several hours. The patient is concerned about an extension of her prior stroke. The patient will be set up for MRI evaluation of the brain. She will continue the aspirin and Plavix. She will have blood work to look for metabolic etiologies  of sensory alteration. She will otherwise follow-up in June 2016. The clinical examination today appears to be unchanged from prior revisit.  Marlan Palau. Keith Willis MD 12/27/2014 8:19 PM  [facility] [address] [place], [place] [id number]  Phone [phone] Fax [phone]

## 2014-12-27 NOTE — Patient Instructions (Signed)
Stroke Prevention Some medical conditions and behaviors are associated with an increased chance of having a stroke. You may prevent a stroke by making healthy choices and managing medical conditions. HOW CAN I REDUCE MY RISK OF HAVING A STROKE?   Stay physically active. Get at least 30 minutes of activity on most or all days.  Do not smoke. It may also be helpful to avoid exposure to secondhand smoke.  Limit alcohol use. Moderate alcohol use is considered to be:  No more than 2 drinks per day for men.  No more than 1 drink per day for nonpregnant women.  Eat healthy foods. This involves:  Eating 5 or more servings of fruits and vegetables a day.  Making dietary changes that address high blood pressure (hypertension), high cholesterol, diabetes, or obesity.  Manage your cholesterol levels.  Making food choices that are high in fiber and low in saturated fat, trans fat, and cholesterol may control cholesterol levels.  Take any prescribed medicines to control cholesterol as directed by your health care provider.  Manage your diabetes.  Controlling your carbohydrate and sugar intake is recommended to manage diabetes.  Take any prescribed medicines to control diabetes as directed by your health care provider.  Control your hypertension.  Making food choices that are low in salt (sodium), saturated fat, trans fat, and cholesterol is recommended to manage hypertension.  Take any prescribed medicines to control hypertension as directed by your health care provider.  Maintain a healthy weight.  Reducing calorie intake and making food choices that are low in sodium, saturated fat, trans fat, and cholesterol are recommended to manage weight.  Stop drug abuse.  Avoid taking birth control pills.  Talk to your health care provider about the risks of taking birth control pills if you are over 35 years old, smoke, get migraines, or have ever had a blood clot.  Get evaluated for sleep  disorders (sleep apnea).  Talk to your health care provider about getting a sleep evaluation if you snore a lot or have excessive sleepiness.  Take medicines only as directed by your health care provider.  For some people, aspirin or blood thinners (anticoagulants) are helpful in reducing the risk of forming abnormal blood clots that can lead to stroke. If you have the irregular heart rhythm of atrial fibrillation, you should be on a blood thinner unless there is a good reason you cannot take them.  Understand all your medicine instructions.  Make sure that other conditions (such as anemia or atherosclerosis) are addressed. SEEK IMMEDIATE MEDICAL CARE IF:   You have sudden weakness or numbness of the face, arm, or leg, especially on one side of the body.  Your face or eyelid droops to one side.  You have sudden confusion.  You have trouble speaking (aphasia) or understanding.  You have sudden trouble seeing in one or both eyes.  You have sudden trouble walking.  You have dizziness.  You have a loss of balance or coordination.  You have a sudden, severe headache with no known cause.  You have new chest pain or an irregular heartbeat. Any of these symptoms may represent a serious problem that is an emergency. Do not wait to see if the symptoms will go away. Get medical help at once. Call your local emergency services (911 in [place] Do not drive yourself to the hospital. Document Released: 12/03/2004 Document Revised: 03/12/2014 Document Reviewed: 04/28/2013 Exit[employer] Patient Information 2015 [employer]. This information is not intended to replace advice given   to you by your health care provider. Make sure you discuss any questions you have with your health care provider.  

## 2014-12-28 ENCOUNTER — Encounter: Payer: Self-pay | Admitting: Cardiology

## 2014-12-28 ENCOUNTER — Other Ambulatory Visit (INDEPENDENT_AMBULATORY_CARE_PROVIDER_SITE_OTHER): Payer: Medicare Other | Admitting: *Deleted

## 2014-12-28 ENCOUNTER — Ambulatory Visit (INDEPENDENT_AMBULATORY_CARE_PROVIDER_SITE_OTHER): Payer: Medicare Other | Admitting: Cardiology

## 2014-12-28 VITALS — BP 150/82 | HR 72 | Ht 63.0 in | Wt 155.0 lb

## 2014-12-28 DIAGNOSIS — E785 Hyperlipidemia, unspecified: Secondary | ICD-10-CM

## 2014-12-28 DIAGNOSIS — I10 Essential (primary) hypertension: Secondary | ICD-10-CM

## 2014-12-28 DIAGNOSIS — I6330 Cerebral infarction due to thrombosis of unspecified cerebral artery: Secondary | ICD-10-CM

## 2014-12-28 DIAGNOSIS — I2510 Atherosclerotic heart disease of native coronary artery without angina pectoris: Secondary | ICD-10-CM

## 2014-12-28 DIAGNOSIS — R200 Anesthesia of skin: Secondary | ICD-10-CM

## 2014-12-28 DIAGNOSIS — Z9861 Coronary angioplasty status: Secondary | ICD-10-CM

## 2014-12-28 LAB — HEPATIC FUNCTION PANEL
ALBUMIN: 3.8 g/dL (ref 3.5–5.2)
ALT: 15 U/L (ref 0–35)
AST: 16 U/L (ref 0–37)
Alkaline Phosphatase: 91 U/L (ref 39–117)
BILIRUBIN TOTAL: 0.6 mg/dL (ref 0.2–1.2)
Bilirubin, Direct: 0.1 mg/dL (ref 0.0–0.3)
Total Protein: 6.3 g/dL (ref 6.0–8.3)

## 2014-12-28 LAB — LIPID PANEL
Cholesterol: 146 mg/dL (ref 0–200)
HDL: 41.9 mg/dL (ref >39.00)
LDL Cholesterol: 77 mg/dL (ref 0–99)
NONHDL: 104.1
TRIGLYCERIDES: 137 mg/dL (ref 0.0–149.0)
Total CHOL/HDL Ratio: 3
VLDL: 27.4 mg/dL (ref 0.0–40.0)

## 2014-12-28 MED ORDER — LISINOPRIL 10 MG PO TABS: 10.0000 mg | ORAL_TABLET | Freq: Every day | ORAL | Status: DC

## 2014-12-28 NOTE — Progress Notes (Signed)
[address] [place], [place]  [address] Phone: [phone] Fax:  [phone]  Date:  12/28/2014   ID:  Shannon PeachEsther [name], DOB 03/30/1951, MRN [id number]  PCP:  Holland CommonsKECK, VALERIE, NP   History of Present Illness: Shannon [name] is a 64 y.o. female with coronary artery disease, LAD stent in 2011, recent non-STEMI in 2016, January, declined left heart catheterization because of stroke resulting in eye/vision changes with interval bowel syndrome, anxiety, fibromyalgia, diabetes here for follow-up.  At last clinic visit, she was seen by Ward Givenshris Berge for bradycardia. Her heart rate was 47. She had trouble increasing heart rate in clinic during walking. She increased it to 64 bpm. Her metoprolol was decreased.  She saw Dr. Anne HahnWillis for her right posterior cerebral artery stroke as well as bilateral middle cerebral artery atherosclerotic changes. Left visual field defect. There was concern about episodic numbness involving arms and legs. She was set up for MRI evaluation of brain. Continuing aspirin and Plavix.  Currently she is doing better. Heart rate is improved. She is still quite emotional about her visual defect. Leg and arm paresthesias as well have been concerning to her. She is crying at times.     Wt Readings from Last 3 Encounters:  12/28/14 155 lb (70.308 kg)  12/27/14 153 lb 12.8 oz (69.763 kg)  12/07/14 154 lb 12.8 oz (70.217 kg)     Past Medical History  Diagnosis Date  . Hypertension   . Coronary artery disease     a. s/p PCI with DES to LAD in 2011  (LHC 03/2010: Mid LAD 70%, normal ejection fraction  >> PCI:  2.75 x 15 mm Promus DES to LAD); b. Nuclear Stress Test 05/29/14: No ischemia, EF 59%, Normal; c. NSTEMI 11/2014 >> patient declined LHC b/c of recent CVA  . Diabetes mellitus without complication   . Arthritis   . Irritable bowel   . Anxiety   . Depression   . Fibromyalgia 1995  . Hx of echocardiogram     a. Echocardiogram 10/25/14: Moderate  LVH, EF 50-55%, normal wall motion, grade 2 diastolic dysfunction, mild LAE, atrial septal lipomatous hypertrophy  . H/O ischemic right PCA stroke 10/2014    a. Carotid US 10/24/14: No significant ICA stenosis bilaterally  . Dyslipidemia 11/20/2014    Past Surgical History  Procedure Laterality Date  . Coronary stent placement    . Cholecystectomy    . Orif arm fracture Right     forearm    Current Outpatient Prescriptions  Medication Sig Dispense Refill  . amLODipine (NORVASC) 5 MG tablet Take 1 tablet (5 mg total) by mouth daily. 30 tablet 3  . aspirin EC 81 MG tablet Take 1 tablet (81 mg total) by mouth daily.    Marland Kitchen. atorvastatin (LIPITOR) 40 MG tablet Take 1 tablet (40 mg total) by mouth daily. 30 tablet 11  . busPIRone (BUSPAR) 15 MG tablet Take 15 mg by mouth. Take 15 mg tab in mornings and 30mg  at night    . clopidogrel (PLAVIX) 75 MG tablet Take 1 tablet (75 mg total) by mouth daily with breakfast. 30 tablet 6  . glimepiride (AMARYL) 2 MG tablet Take 1 tablet (2 mg total) by mouth 2 (two) times daily. 60 tablet 3  . glucose blood test strip Check blood sugar before each meal and at bedtime total 4 times per day 100 each 12  . hydrOXYzine (ATARAX/VISTARIL) 10 MG tablet Take 10 mg by  mouth 3 (three) times daily as needed for anxiety (take one tablet three times daily as needed for anxiety).    . isosorbide mononitrate (IMDUR) 30 MG 24 hr tablet TAKE 1 TABLET (30 MG TOTAL) BY MOUTH EVERY MORNING. 30 tablet 1  . Lancets (FREESTYLE) lancets Check blood sugar four times per day. Before each meal and at bedtime 100 each 12  . levothyroxine (SYNTHROID, LEVOTHROID) 25 MCG tablet TAKE 1 TABLET (25 MCG TOTAL) BY MOUTH DAILY BEFORE BREAKFAST. 30 tablet 1  . lisinopril (PRINIVIL,ZESTRIL) 5 MG tablet Take 1 tablet (5 mg total) by mouth daily. 30 tablet 11  . metoprolol tartrate (LOPRESSOR) 25 MG tablet Take 1 tablet (25 mg total) by mouth 2 (two) times daily. 180 tablet 3  . nitroGLYCERIN  (NITROSTAT) 0.4 MG SL tablet Place 1 tablet (0.4 mg total) under the tongue every 5 (five) minutes as needed. 25 tablet 3  . pantoprazole (PROTONIX) 40 MG tablet Take 1 tablet (40 mg total) by mouth daily. 30 tablet 11  . vitamin E (VITAMIN E) 400 UNIT capsule Take 400 Units by mouth every morning.    . [DISCONTINUED] isosorbide dinitrate (ISORDIL) 30 MG tablet Take 1 tablet (30 mg total) by mouth every morning. 30 tablet 2   No current facility-administered medications for this visit.    Allergies:    Allergies  Allergen Reactions  . Sulfa Antibiotics Other (See Comments)    Childhood allergy, can't remember    Social History:  The patient  reports that she has never smoked. She has never used smokeless tobacco. She reports that she does not drink alcohol or use illicit drugs.   Family History  Problem Relation Age of Onset  . Cancer Mother   . Diabetes Mother   . Hypertension Mother   . Heart failure Mother   . CAD Mother   . Cancer Father   . Diabetes Father   . Hypertension Father   . Heart failure Father   . CAD Father   . CAD Brother   . Diabetes Brother   . Hypertension Brother   . CAD Sister   . Diabetes Sister   . Hypertension Sister   . Heart attack Father   . Heart attack Mother   . Heart attack Sister   . Heart attack Brother   . Stroke Father     ROS:  Please see the history of present illness.   Still having visual difficulties.   All other systems reviewed and negative.   PHYSICAL EXAM: VS:  BP 150/82 mmHg  Pulse 72  Ht  (1.6 m)  Wt 155 lb (70.308 kg)  BMI 27.46 kg/m2 Well nourished, well developed, in no acute distress HEENT: Left eye visual changes Neck: no JVD, normal carotid upstroke, no bruit Cardiac:  normal S1, S2; RRR; no murmur Lungs:  clear to auscultation bilaterally, no wheezing, rhonchi or rales Abd: soft, nontender, no hepatomegaly, no bruits Ext: no edema, 2+ distal pulses Skin: warm and dry GU: deferred Neuro: no focal  abnormalities noted, AAO x 3  EKG:  None today, prior sinus bradycardia     - LHC 03/2010: Mid LAD 70%, normal ejection fraction >> PCI: 2.75 x 15 mm Promus DES to LAD  - Nuclear Stress Test 05/29/14: No ischemia, EF 59%, Normal  - Carotid US 10/24/14: No significant ICA stenosis bilaterally  - Echocardiogram 10/25/14: Moderate LVH, EF 50-55%, normal wall motion, grade 2 diastolic dysfunction, mild LAE, atrial septal lipomatous hypertrophy  ASSESSMENT AND PLAN:  1. Bradycardia-resolved after decreasing her metoprolol. Doing well. 2. Recent stroke posterior, left visual field defect-this is her main concern. She is also having some left arm paresthesias as well as left leg paresthesias. Awaiting repeat MRI by Dr. Anne Hahn. She has intermittent visual changes, dim appearance 3. Coronary artery disease-stable LAD stent, drug-eluting 2011. Continuing with both aspirin and Plavix because of stroke. I would be inclined at this point to refrain from proceeding with heart catheterization, she is beginning to have more cardiac type symptoms. She is still highly concerned about the possibility of harm from catheterization, i.e. worsening stroke. I'm fine with holding off at this point in treating aggressive medication management. Remember, prior nuclear stress test showed no ischemia. 4. Dizziness-likely multifactorial 5. 3 month follow up.  Signed, Donato Schultz, MD [facility]  12/28/2014 10:13 AM

## 2014-12-28 NOTE — Patient Instructions (Signed)
Please increase your Lisinopril to 10 mg a day. Continue all other medications as listed.  Follow up in 3 months with Dr Anne FuSkains.  Thank you for choosing [facility]!!

## 2014-12-29 LAB — METHYLMALONIC ACID, SERUM: METHYLMALONIC ACID: 179 nmol/L (ref 0–378)

## 2014-12-29 LAB — COPPER, SERUM: COPPER: 108 ug/dL (ref 72–166)

## 2014-12-29 LAB — VITAMIN B12: Vitamin B-12: 330 pg/mL (ref 211–946)

## 2014-12-31 ENCOUNTER — Telehealth: Payer: Self-pay | Admitting: *Deleted

## 2014-12-31 ENCOUNTER — Telehealth: Payer: Self-pay | Admitting: Cardiology

## 2014-12-31 NOTE — Telephone Encounter (Signed)
Left message for Mardella LaymanLindsey that I have not seen the fax she is referring to and she should re-fax it if possible.  Requested she call back if concerns

## 2014-12-31 NOTE — Telephone Encounter (Signed)
New problem   Need to know status and was form received for Home Health order for skill nursing that was fax over [phone].  Please advise.

## 2014-12-31 NOTE — Telephone Encounter (Signed)
No answer on pt's home #, lmom on pt's son's phone tcb for lab results.

## 2014-12-31 NOTE — Telephone Encounter (Signed)
Follow up ° ° ° ° °Returning a nurses call to get lab results °

## 2015-01-02 ENCOUNTER — Encounter: Payer: Self-pay | Admitting: *Deleted

## 2015-01-02 ENCOUNTER — Telehealth: Payer: Self-pay | Admitting: *Deleted

## 2015-01-02 NOTE — Telephone Encounter (Signed)
ptcb and was notified of lab results. Pt then states to me she has extreme amount of anxiety, said psychiatrist presc. buspar, she had side effects dizziness, insomnia, states psych advised to keep trying med and increased, no side effects worse. She also states her d/c papers from hospital stated to continue lexapro and xanax. Pt states pcp nor psych will fill these. I explained that this is cardiology and we do not fill these meds and that she will need to re-address this issue with pcp or psychiatrist. Pt states she has left arm pain/tingling. She denied any chest pain. Pt did not seem SOB on the phone though she is very anxious. I said I will see if I can have a triage nurse cb to make sure the arm pain is not cardiac, which I told pt that I did not feel that it was and that it was from her anxiety. Pt said thank you.

## 2015-01-02 NOTE — Telephone Encounter (Signed)
lmptcb to go over results x 3 . I will send out letter today

## 2015-01-02 NOTE — Telephone Encounter (Signed)
Called patient back. Patient stated she has a history of stroke and she has already contacted her neurologist about these symptoms. Patient stated that she has an MRI coming up in March. Patient complained of needing medication for anxiety, informed patient that she needs to call her PCP to have her medication refilled. Patient stated no one will give me anything for my anxiety. Informed patient if she is feeling that anxious and she does not have a PCP, to follow up with urgent care. Patient continued to talk in circles and instructions were repeated several times. Patient finally agreed to follow up with urgent care or find a PCP, and keep her appointment with her neurologist.

## 2015-01-09 NOTE — Telephone Encounter (Signed)
Have received fax and placed on Dr Anne FuSkains' cart to be signed

## 2015-01-10 ENCOUNTER — Telehealth: Payer: Self-pay | Admitting: Cardiology

## 2015-01-10 NOTE — Telephone Encounter (Signed)
New message      Returning a nurses call probably to give her lab results

## 2015-01-10 NOTE — Telephone Encounter (Signed)
Paperwork signed and taken to MR to be faxed. 

## 2015-01-10 NOTE — Telephone Encounter (Signed)
Reviewed pt's most recent lab work and her next appt 5/20 with Dr Anne FuSkains at 2 pm.  She states understanding.

## 2015-01-12 ENCOUNTER — Ambulatory Visit
Admission: RE | Admit: 2015-01-12 | Discharge: 2015-01-12 | Disposition: A | Discharge status: Home / Self Care | Payer: Medicare Other | Source: Ambulatory Visit | Attending: Neurology | Admitting: Neurology

## 2015-01-12 DIAGNOSIS — I6330 Cerebral infarction due to thrombosis of unspecified cerebral artery: Secondary | ICD-10-CM

## 2015-01-12 DIAGNOSIS — R200 Anesthesia of skin: Secondary | ICD-10-CM

## 2015-01-14 ENCOUNTER — Telehealth: Payer: Self-pay | Admitting: Neurology

## 2015-01-14 NOTE — Telephone Encounter (Signed)
I called the patient. MRI the brain shows evolutionary changes in the occipital and mesial temporal areas of so she with a stroke. No new stroke outside this distribution.   MRI brain January 14 2015:  IMPRESSION: This is an abnormal MRI of the brain without contrast showing a remote right PCA territory stroke involving the right occipital  and temporal lobes. There is no hemorrhage. When compared to a prior study dated 11/18/2014, there has been evolution of the then subacute stroke with more encephalomalacia and much less restricted diffusion noted on the current study.

## 2015-01-17 ENCOUNTER — Other Ambulatory Visit: Payer: Self-pay | Admitting: Internal Medicine

## 2015-01-17 MED ORDER — METHOCARBAMOL 500 MG PO TABS: 500.0000 mg | ORAL_TABLET | Freq: Three times a day (TID) | ORAL | Status: DC

## 2015-01-17 NOTE — Telephone Encounter (Signed)
Patient has additional questions regarding MRI results.  Please call and advise. ° °

## 2015-01-17 NOTE — Telephone Encounter (Signed)
I called the patient. I went over the results of the MRI. The patient indicates that she is having episodes of numbness of the arms, it may be the right arm or the left arm independently or both arms together. She reports neck pain and some shoulder discomfort. This may be related to a neck issue, she may have some cervical spasm resulting in numbness and tingling down arms. I will try a course of Robaxin for about 2 weeks, if this is effective, we may continue the medication longer.

## 2015-01-17 NOTE — Addendum Note (Signed)
Addended by: Stephanie AcreWILLIS, CHARLES on: 01/17/2015 06:18 PM   Modules accepted: Orders

## 2015-01-20 ENCOUNTER — Other Ambulatory Visit: Payer: Self-pay | Admitting: Physician Assistant

## 2015-01-20 ENCOUNTER — Other Ambulatory Visit: Payer: Self-pay | Admitting: Internal Medicine

## 2015-01-21 ENCOUNTER — Telehealth: Payer: Self-pay | Admitting: Emergency Medicine

## 2015-01-21 NOTE — Telephone Encounter (Signed)
Dustin please send prescription for this patient.

## 2015-01-30 ENCOUNTER — Telehealth: Payer: Self-pay | Admitting: Cardiology

## 2015-01-30 NOTE — Telephone Encounter (Signed)
Orders are in Dr. Anne FuSkains' folder to sign.  I left this information on [name]'s voicemail. Also left message that Dr. Anne FuSkains would return to office next Wednesday. Left message to call back if questions.

## 2015-01-30 NOTE — Telephone Encounter (Signed)
New Message  [name] from [facility] called to follow up on home health orders to be signed by Dr. Anne FuSkains. Please call back and discuss.

## 2015-02-06 ENCOUNTER — Telehealth: Payer: Self-pay | Admitting: Cardiology

## 2015-02-06 ENCOUNTER — Other Ambulatory Visit: Payer: Self-pay | Admitting: Internal Medicine

## 2015-02-06 NOTE — Telephone Encounter (Signed)
New Message       [facility] calling stating that they are checking on the status of skilled nursing home health forms that were faxed to Dr. Anne FuSkains for him to sign. Please call back and advise.

## 2015-02-07 NOTE — Telephone Encounter (Signed)
HHO on Dr Anne FuSkains' cart to be signed.  He will be in the office Friday and they will be faxed once signed.

## 2015-02-08 NOTE — Telephone Encounter (Signed)
Paperwork signed and taken to be faxed.  Lm for PawneeStephanie.

## 2015-02-11 ENCOUNTER — Telehealth: Payer: Self-pay | Admitting: Cardiology

## 2015-02-11 NOTE — Telephone Encounter (Signed)
Reviewed with patient that she is to be taking Lisinopril 10 mg a day.  She reports her therapist told her she needs to take over taking her own medications and she was just wanting to make sure she had the correct dose written down

## 2015-02-11 NOTE — Telephone Encounter (Signed)
Pt c/o medication issue:  1. Name of Medication: Lisinopril  2. How are you currently taking this medication (dosage and times per day)? Pt is unsure  3. Are you having a reaction (difficulty breathing--STAT)? No  4. What is your medication issue? Pt calling stating that she has been given 10mg  and 5mg  tabs of this medication and knows that Dr. Anne FuSkains recently changed the amount that she takes. Pt wants to know if she is supposed to be taking one of each daily or what the amount is. Please call back and advise.

## 2015-02-24 ENCOUNTER — Other Ambulatory Visit: Payer: Self-pay | Admitting: Cardiology

## 2015-02-24 ENCOUNTER — Other Ambulatory Visit: Payer: Self-pay | Admitting: Internal Medicine

## 2015-02-25 ENCOUNTER — Encounter: Payer: Self-pay | Admitting: Cardiology

## 2015-02-25 NOTE — Telephone Encounter (Signed)
Needs to be refilled from PCP - OK X 1 - pt has appt in May

## 2015-02-26 ENCOUNTER — Other Ambulatory Visit: Payer: Self-pay | Admitting: Internal Medicine

## 2015-03-01 ENCOUNTER — Other Ambulatory Visit: Payer: Self-pay | Admitting: Physician Assistant

## 2015-03-07 ENCOUNTER — Other Ambulatory Visit: Payer: Self-pay | Admitting: Internal Medicine

## 2015-03-15 ENCOUNTER — Other Ambulatory Visit: Payer: Self-pay

## 2015-03-15 DIAGNOSIS — I10 Essential (primary) hypertension: Secondary | ICD-10-CM

## 2015-03-15 MED ORDER — AMLODIPINE BESYLATE 5 MG PO TABS: 5.0000 mg | ORAL_TABLET | Freq: Every day | ORAL | Status: DC

## 2015-03-18 ENCOUNTER — Other Ambulatory Visit: Payer: Self-pay | Admitting: Internal Medicine

## 2015-03-21 ENCOUNTER — Other Ambulatory Visit: Payer: Self-pay | Admitting: Internal Medicine

## 2015-03-26 ENCOUNTER — Other Ambulatory Visit: Payer: Self-pay | Admitting: Internal Medicine

## 2015-03-27 NOTE — Telephone Encounter (Signed)
Patient is overdue for 3 month f/u with PCP (Due 10/16/14) Will refill for 30 days and then patient will need to schedule f/u to receive additional refills

## 2015-03-29 ENCOUNTER — Other Ambulatory Visit: Payer: Self-pay | Admitting: *Deleted

## 2015-03-29 ENCOUNTER — Ambulatory Visit (INDEPENDENT_AMBULATORY_CARE_PROVIDER_SITE_OTHER): Payer: Medicare Other | Admitting: Nurse Practitioner

## 2015-03-29 ENCOUNTER — Ambulatory Visit (INDEPENDENT_AMBULATORY_CARE_PROVIDER_SITE_OTHER): Payer: Medicare Other | Admitting: Cardiology

## 2015-03-29 ENCOUNTER — Encounter: Payer: Self-pay | Admitting: Cardiology

## 2015-03-29 ENCOUNTER — Encounter: Payer: Self-pay | Admitting: Nurse Practitioner

## 2015-03-29 VITALS — BP 142/88 | HR 62 | Ht 64.0 in | Wt 157.0 lb

## 2015-03-29 VITALS — BP 153/83 | HR 66 | Ht 64.0 in | Wt 158.2 lb

## 2015-03-29 DIAGNOSIS — R001 Bradycardia, unspecified: Secondary | ICD-10-CM | POA: Diagnosis not present

## 2015-03-29 DIAGNOSIS — R05 Cough: Secondary | ICD-10-CM

## 2015-03-29 DIAGNOSIS — Z8673 Personal history of transient ischemic attack (TIA), and cerebral infarction without residual deficits: Secondary | ICD-10-CM

## 2015-03-29 DIAGNOSIS — I2510 Atherosclerotic heart disease of native coronary artery without angina pectoris: Secondary | ICD-10-CM | POA: Diagnosis not present

## 2015-03-29 DIAGNOSIS — I6330 Cerebral infarction due to thrombosis of unspecified cerebral artery: Secondary | ICD-10-CM

## 2015-03-29 DIAGNOSIS — I639 Cerebral infarction, unspecified: Secondary | ICD-10-CM

## 2015-03-29 DIAGNOSIS — I2583 Coronary atherosclerosis due to lipid rich plaque: Principal | ICD-10-CM

## 2015-03-29 DIAGNOSIS — T464X5A Adverse effect of angiotensin-converting-enzyme inhibitors, initial encounter: Secondary | ICD-10-CM

## 2015-03-29 DIAGNOSIS — H539 Unspecified visual disturbance: Secondary | ICD-10-CM

## 2015-03-29 MED ORDER — ISOSORBIDE MONONITRATE ER 30 MG PO TB24: ORAL_TABLET | ORAL | Status: DC

## 2015-03-29 MED ORDER — LOSARTAN POTASSIUM 25 MG PO TABS
25.0000 mg | ORAL_TABLET | Freq: Every day | ORAL | Status: DC
Start: 2015-03-29 — End: 2015-11-15

## 2015-03-29 NOTE — Progress Notes (Addendum)
[facility]  PATIENT: Shannon [name] DOB: 01/12/1951   REASON FOR VISIT: follow up for stroke HISTORY FROM:patient     HISTORY OF PRESENT ILLNESS: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 She was last seen in this office by Dr. Anne HahnWillis 12/27/2014. 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 The patient has been on aspirin and Plavix. 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 When last seen  she has noted intermittent episodes of migratory sensory alteration that may affect the arms together, one arm or the other, or one leg or the other.  Both feet may be affected, occasionally she will have some sensory symptoms around the mouth. She denies any weakness, or change in vision. She has not had any slurred speech or difficulty swallowing. She denies any change in balance. She has not fallen. She uses a quad cane .3/7/16MRI of the brain without contrast  was abnormal  Showing a remote right PCA territory stroke involving the right occipital and temporal lobes. There is no hemorrhage. When compared to a prior study dated 11/18/2014, there has been evolution of the then subacute stroke with more encephalomalacia and much less restricted diffusion noted on the current study. She returns for reevaluation. She tells me her 66 year old granddaughter has bone cancer and is in hospice. She sees psychiatry for her depression and anxiety.   REVIEW OF SYSTEMS: Full 14 system review of systems performed and notable only for those listed, all others are neg:  Constitutional: Fatigue Cardiovascular: neg Ear/Nose/Throat: neg  Skin: neg Eyes: Blurred vision Respiratory: neg Gastroitestinal: neg  Hematology/Lymphatic: neg  Endocrine: neg Musculoskeletal:neg Allergy/Immunology: neg Neurological:  Dizziness, numbness, weakness Psychiatric: Anxiety  Sleep : neg   ALLERGIES: Allergies  Allergen Reactions  . Sulfa Antibiotics Other (See Comments)    Childhood allergy, can't remember    HOME MEDICATIONS: Outpatient Prescriptions Prior to Visit  Medication Sig Dispense Refill  . ALPRAZolam (XANAX) 0.5 MG tablet Take 0.5 mg by mouth at bedtime.     Marland Kitchen. amLODipine (NORVASC) 5 MG tablet Take 1 tablet (5 mg total) by mouth daily. 30 tablet 3  . aspirin EC 81 MG tablet Take 1 tablet (81 mg total) by mouth daily.    Marland Kitchen. atorvastatin (LIPITOR) 40 MG tablet Take 1 tablet (40 mg total) by mouth daily. 30 tablet 11  . clopidogrel (PLAVIX) 75 MG tablet Take 1 tablet (75 mg total) by mouth daily with breakfast. 30 tablet 6  . gabapentin (NEURONTIN) 300 MG capsule Take 300 mg by mouth daily as needed.     Marland Kitchen. glimepiride (AMARYL) 2 MG tablet Take 1 tablet (2 mg total) by mouth 2 (two) times daily. 60 tablet 3  . glucose blood test strip Check blood sugar before each meal and at bedtime total 4 times per day 100 each 12  . hydrOXYzine (ATARAX/VISTARIL) 10 MG tablet Take 10 mg by mouth 3 (three) times daily as needed for anxiety (take one tablet three times daily as needed for anxiety).    . isosorbide mononitrate (IMDUR) 30 MG 24 hr tablet TAKE 1 TABLET (30 MG TOTAL) BY MOUTH EVERY MORNING. (Patient taking differently: TAKE 1 TABLET (30 MG TOTAL) BY MOUTH EVERY night.) 30 tablet 0  . Lancets (FREESTYLE) lancets Check blood sugar four times per day. Before each meal and at bedtime 100 each 12  . levothyroxine (  SYNTHROID, LEVOTHROID) 25 MCG tablet TAKE 1 TABLET (25 MCG TOTAL) BY MOUTH DAILY BEFORE BREAKFAST. 30 tablet 1  . lisinopril (PRINIVIL,ZESTRIL) 10 MG tablet Take 1 tablet (10 mg total) by mouth daily. 30 tablet 6  . methocarbamol (ROBAXIN) 500 MG tablet Take 1 tablet (500 mg total) by mouth 3 (three) times daily. (Patient taking differently: Take 500 mg by mouth daily as needed. ) 30 tablet 1  .  metoprolol tartrate (LOPRESSOR) 25 MG tablet Take 1 tablet (25 mg total) by mouth 2 (two) times daily. 180 tablet 3  . nitroGLYCERIN (NITROSTAT) 0.4 MG SL tablet Place 1 tablet (0.4 mg total) under the tongue every 5 (five) minutes as needed. 25 tablet 3  . pantoprazole (PROTONIX) 40 MG tablet Take 1 tablet (40 mg total) by mouth daily. 30 tablet 11  . vitamin E (VITAMIN E) 400 UNIT capsule Take 400 Units by mouth every morning.    . busPIRone (BUSPAR) 15 MG tablet Take 15 mg by mouth. Take 15 mg tab in mornings and 30mg  at night    . lisinopril (PRINIVIL,ZESTRIL) 5 MG tablet      No facility-administered medications prior to visit.    PAST MEDICAL HISTORY: Past Medical History  Diagnosis Date  . Hypertension   . Coronary artery disease     a. s/p PCI with DES to LAD in 2011  (LHC 03/2010: Mid LAD 70%, normal ejection fraction  >> PCI:  2.75 x 15 mm Promus DES to LAD); b. Nuclear Stress Test 05/29/14: No ischemia, EF 59%, Normal; c. NSTEMI 11/2014 >> patient declined LHC b/c of recent CVA  . Diabetes mellitus without complication   . Arthritis   . Irritable bowel   . Anxiety   . Depression   . Fibromyalgia 1995  . Hx of echocardiogram     a. Echocardiogram 10/25/14: Moderate LVH, EF 50-55%, normal wall motion, grade 2 diastolic dysfunction, mild LAE, atrial septal lipomatous hypertrophy  . H/O ischemic right PCA stroke 10/2014    a. Carotid US 10/24/14: No significant ICA stenosis bilaterally  . Dyslipidemia 11/20/2014    PAST SURGICAL HISTORY: Past Surgical History  Procedure Laterality Date  . Coronary stent placement    . Cholecystectomy    . Orif arm fracture Right     forearm    FAMILY HISTORY: Family History  Problem Relation Age of Onset  . Cancer Mother   . Diabetes Mother   . Hypertension Mother   . Heart failure Mother   . CAD Mother   . Cancer Father   . Diabetes Father   . Hypertension Father   . Heart failure Father   . CAD Father   . CAD Brother   .  Diabetes Brother   . Hypertension Brother   . CAD Sister   . Diabetes Sister   . Hypertension Sister   . Heart attack Father   . Heart attack Mother   . Heart attack Sister   . Heart attack Brother   . Stroke Father     SOCIAL HISTORY: History   Social History  . Marital Status: Married    Spouse Name: N/A  . Number of Children: 4  . Years of Education: N/A   Occupational History  . retirred    Social History Main Topics  . Smoking status: Passive Smoke Exposure - Never Smoker    Start date: 11/09/2012  . Smokeless tobacco: Never Used  . Alcohol Use: No  . Drug Use: No  .  Sexual Activity: Not on file   Other Topics Concern  . Not on file   Social History Narrative   Patient is right handed.   Patient drinks about 1 coke daily.   Separated from husband. 03-29-15      PHYSICAL EXAM  Filed Vitals:   03/29/15 1101  BP: 153/83  Pulse: 66  Height:  (1.626 m)  Weight: 158 lb 3.2 oz (71.759 kg)   Body mass index is 27.14 kg/(m^2).  General: The patient is alert and cooperative at the time of the examination. Skin: No significant peripheral edema is noted.  Neurologic Exam Mental status: The patient is oriented x 3. Cranial nerves: Facial symmetry is present. Speech is normal, no aphasia or dysarthria is noted. Extraocular movements are full. Visual fields are notable for a dense left homonymous visual field deficit. Motor: The patient has good strength in all 4 extremities. No focal weakness Sensory examination: Soft touch sensation symmetric in the arms and legs  Coordination: The patient has good finger-nose-finger and heel-to-shin bilaterally. Gait and station: The patient has a normal gait. Tandem gait is slightly unsteady. Romberg is negative. No drift is seen. She uses a quad cane Reflexes: Deep tendon reflexes are symmetric.  DIAGNOSTIC DATA (LABS, IMAGING, TESTING) - I reviewed patient records, labs, notes, testing and imaging myself where  available.  Lab Results  Component Value Date   WBC 6.9 11/20/2014   HGB 13.6 11/20/2014   HCT 40.1 11/20/2014   MCV 86.1 11/20/2014   PLT 159 11/20/2014      Component Value Date/Time   NA 138 12/07/2014 1333   K 3.8 12/07/2014 1333   CL 104 12/07/2014 1333   CO2 27 12/07/2014 1333   GLUCOSE 220* 12/07/2014 1333   BUN 8 12/07/2014 1333   CREATININE 0.84 12/07/2014 1333   CREATININE 0.74 03/14/2014 1554   CALCIUM 9.0 12/07/2014 1333   PROT 6.3 12/28/2014 1037   ALBUMIN 3.8 12/28/2014 1037   AST 16 12/28/2014 1037   ALT 15 12/28/2014 1037   ALKPHOS 91 12/28/2014 1037   BILITOT 0.6 12/28/2014 1037   GFRNONAA 75* 11/19/2014 0345   GFRNONAA 86 03/14/2014 1554   GFRAA 86* 11/19/2014 0345   GFRAA >89 03/14/2014 1554   Lab Results  Component Value Date   CHOL 146 12/28/2014   HDL 41.90 12/28/2014   LDLCALC 77 12/28/2014   TRIG 137.0 12/28/2014   CHOLHDL 3 12/28/2014   Lab Results  Component Value Date   HGBA1C 7.9* 10/24/2014   Lab Results  Component Value Date   VITAMINB12 330 12/27/2014   Lab Results  Component Value Date   TSH 4.114 07/17/2014      ASSESSMENT AND PLAN  64 y.o. year old female  has a past medical history of Hypertension; Coronary artery disease; Diabetes mellitus without complication;Dyslipidemia and right posterior cerebral artery distribution stroke.3/7/16MRI of the brain without contrast  was abnormal  Showing a remote right PCA territory stroke involving the right occipital and temporal lobes. There is no hemorrhage. When compared to a prior study dated 11/18/2014, there has been evolution of the then subacute stroke with more encephalomalacia and much less restricted diffusion noted on the current study. The patient is a current patient of Dr. Anne Hahn  who is out of the office today . This note is sent to the work in doctor.      Continue Plavix and aspirin for secondary stroke prevention Keep blood pressure systolic less than 130 today's  reading  153/83 Hemoglobin A1c less than 6.5, last in December 7.9 Exercise for overall health and well-being Healthy diet Use cane at all times for safe ambulation Follow-up in 6-8 months Nilda Riggs, [facility], [facility], APRN  [facility] [address], [address] [place], [place] [address] [phone]  I reviewed the above note and documentation by the Nurse Practitioner and agree with the history, physical exam, assessment and plan as outlined above. I was immediately available for face-to-face consultation. Huston Foley, MD, PhD [facility])

## 2015-03-29 NOTE — Patient Instructions (Signed)
Continue Plavix and aspirin for secondary stroke prevention Keep blood pressure systolic less than 130 today's reading 153/83 Use cane at all times for safe ambulation Follow-up in 6-8 months

## 2015-03-29 NOTE — Progress Notes (Signed)
[address], [address] [place], [place]  [address] Phone: [phone] Fax:  [phone]  Date:  03/29/2015   ID:  Shannon [name], DOB 09/07/1951, MRN [id number]  PCP:  Enrique Sack, MD   History of Present Illness: Shannon [name] is a 64 y.o. female with coronary artery disease, LAD stent in 2011, recent non-STEMI in 2016, January, declined left heart catheterization because of stroke resulting in eye/vision changes with interval bowel syndrome, anxiety, fibromyalgia, diabetes here for follow-up.  She is very sad currently because her granddaughter of 45 years old has Ewing sarcoma.  At prior visit, she was seen by Ward Givens for bradycardia. Her heart rate was 47. She had trouble increasing heart rate in clinic during walking. She increased it to 64 bpm. Her metoprolol was decreased.  She sees Dr. Anne Hahn for her right posterior cerebral artery stroke as well as bilateral middle cerebral artery atherosclerotic changes. Left visual field defect. Continuing aspirin and Plavix. during periods of emotional stress, she will have decreased vision.   Heart rate is improved. She is still quite emotional about her visual defect. Crying.     Wt Readings from Last 3 Encounters:  03/29/15 157 lb (71.215 kg)  03/29/15 158 lb 3.2 oz (71.759 kg)  01/12/15 155 lb (70.308 kg)     Past Medical History  Diagnosis Date  . Hypertension   . Coronary artery disease     a. s/p PCI with DES to LAD in 2011  (LHC 03/2010: Mid LAD 70%, normal ejection fraction  >> PCI:  2.75 x 15 mm Promus DES to LAD); b. Nuclear Stress Test 05/29/14: No ischemia, EF 59%, Normal; c. NSTEMI 11/2014 >> patient declined LHC b/c of recent CVA  . Diabetes mellitus without complication   . Arthritis   . Irritable bowel   . Anxiety   . Depression   . Fibromyalgia 1995  . Hx of echocardiogram     a. Echocardiogram 10/25/14: Moderate LVH, EF 50-55%, normal wall motion, grade 2 diastolic dysfunction, mild  LAE, atrial septal lipomatous hypertrophy  . H/O ischemic right PCA stroke 10/2014    a. Carotid US 10/24/14: No significant ICA stenosis bilaterally  . Dyslipidemia 11/20/2014    Past Surgical History  Procedure Laterality Date  . Coronary stent placement    . Cholecystectomy    . Orif arm fracture Right     forearm    Current Outpatient Prescriptions  Medication Sig Dispense Refill  . ALPRAZolam (XANAX) 0.5 MG tablet Take 0.5 mg by mouth at bedtime.     Marland Kitchen amLODipine (NORVASC) 5 MG tablet Take 1 tablet (5 mg total) by mouth daily. 30 tablet 3  . aspirin EC 81 MG tablet Take 1 tablet (81 mg total) by mouth daily.    Marland Kitchen atorvastatin (LIPITOR) 40 MG tablet Take 1 tablet (40 mg total) by mouth daily. 30 tablet 11  . clopidogrel (PLAVIX) 75 MG tablet Take 1 tablet (75 mg total) by mouth daily with breakfast. 30 tablet 6  . gabapentin (NEURONTIN) 300 MG capsule Take 300 mg by mouth daily as needed.     Marland Kitchen glimepiride (AMARYL) 2 MG tablet Take 1 tablet (2 mg total) by mouth 2 (two) times daily. 60 tablet 3  . glucose blood test strip Check blood sugar before each meal and at bedtime total 4 times per day 100 each 12  . hydrOXYzine (ATARAX/VISTARIL) 10 MG tablet Take 10 mg by mouth 3 (three)  times daily as needed for anxiety (take one tablet three times daily as needed for anxiety).    . isosorbide mononitrate (IMDUR) 30 MG 24 hr tablet TAKE 1 TABLET (30 MG TOTAL) BY MOUTH EVERY MORNING. (Patient taking differently: TAKE 1 TABLET (30 MG TOTAL) BY MOUTH EVERY night.) 30 tablet 0  . Lancets (FREESTYLE) lancets Check blood sugar four times per day. Before each meal and at bedtime 100 each 12  . levothyroxine (SYNTHROID, LEVOTHROID) 25 MCG tablet TAKE 1 TABLET (25 MCG TOTAL) BY MOUTH DAILY BEFORE BREAKFAST. 30 tablet 1  . lisinopril (PRINIVIL,ZESTRIL) 10 MG tablet Take 1 tablet (10 mg total) by mouth daily. 30 tablet 6  . methocarbamol (ROBAXIN) 500 MG tablet Take 1 tablet (500 mg total) by mouth 3  (three) times daily. (Patient taking differently: Take 500 mg by mouth daily as needed. ) 30 tablet 1  . metoprolol tartrate (LOPRESSOR) 25 MG tablet Take 1 tablet (25 mg total) by mouth 2 (two) times daily. 180 tablet 3  . Multiple Vitamin (MULTIVITAMIN) capsule Take 1 capsule by mouth daily. Senior vitamin    . nitroGLYCERIN (NITROSTAT) 0.4 MG SL tablet Place 1 tablet (0.4 mg total) under the tongue every 5 (five) minutes as needed. 25 tablet 3  . pantoprazole (PROTONIX) 40 MG tablet Take 1 tablet (40 mg total) by mouth daily. 30 tablet 11  . temazepam (RESTORIL) 7.5 MG capsule Take 7.5 mg by mouth at bedtime.    . vitamin E (VITAMIN E) 400 UNIT capsule Take 400 Units by mouth every morning.    . [DISCONTINUED] isosorbide dinitrate (ISORDIL) 30 MG tablet Take 1 tablet (30 mg total) by mouth every morning. 30 tablet 2   No current facility-administered medications for this visit.    Allergies:    Allergies  Allergen Reactions  . Sulfa Antibiotics Other (See Comments)    Childhood allergy, can't remember    Social History:  The patient  reports that she has been passively smoking.  She started smoking about 2 years ago. She has never used smokeless tobacco. She reports that she does not drink alcohol or use illicit drugs.   Family History  Problem Relation Age of Onset  . Cancer Mother   . Diabetes Mother   . Hypertension Mother   . Heart failure Mother   . CAD Mother   . Cancer Father   . Diabetes Father   . Hypertension Father   . Heart failure Father   . CAD Father   . CAD Brother   . Diabetes Brother   . Hypertension Brother   . CAD Sister   . Diabetes Sister   . Hypertension Sister   . Heart attack Father   . Heart attack Mother   . Heart attack Sister   . Heart attack Brother   . Stroke Father     ROS:  Please see the history of present illness.   Still having visual difficulties.   All other systems reviewed and negative.   PHYSICAL EXAM: VS:  BP 142/88 mmHg   Pulse 62  Ht 5\' 4"  (1.626 m)  Wt 157 lb (71.215 kg)  BMI 26.94 kg/m2 Well nourished, well developed, in no acute distress HEENT: Left eye visual changes Neck: no JVD, normal carotid upstroke, no bruit Cardiac:  normal S1, S2; RRR; no murmur Lungs:  clear to auscultation bilaterally, no wheezing, rhonchi or rales Abd: soft, nontender, no hepatomegaly, no bruits Ext: no edema, 2+ distal pulses Skin: warm and dry  GU: deferred Neuro: no focal abnormalities noted, AAO x 3  EKG:  None today, prior sinus bradycardia     - LHC 03/2010: Mid LAD 70%, normal ejection fraction >> PCI: 2.75 x 15 mm Promus DES to LAD  - Nuclear Stress Test 05/29/14: No ischemia, EF 59%, Normal  - Carotid US 10/24/14: No significant ICA stenosis bilaterally  - Echocardiogram 10/25/14: Moderate LVH, EF 50-55%, normal wall motion, grade 2 diastolic dysfunction, mild LAE, atrial septal lipomatous hypertrophy   ASSESSMENT AND PLAN:  1. Bradycardia-resolved after decreasing her metoprolol. Doing well. 2. Recent stroke posterior, left visual field defect-this is her main concern.  She has intermittent visual changes, dim appearance 3. Grief-unfortunately her 66 year old granddaughter, daughter of her 66 year old son is dying from Ewing sarcoma. Obviously devastating to her. 4. Coronary artery disease-stable LAD stent, drug-eluting 2011. Continuing with both aspirin and Plavix because of stroke. I would be inclined at this point to refrain from proceeding with heart catheterization, she is beginning to have more cardiac type symptoms. She is still highly concerned about the possibility of harm from catheterization, i.e. worsening stroke. I'm fine with holding off at this point in treating aggressive medication management. Remember, prior nuclear stress test showed no ischemia. 5. Dizziness-likely multifactorial 6. Cough-possibly from ACE inhibitor. It is a dry cough. I will change her lisinopril over to losartan 25 mg once a  day.  7. 6 month follow up.  Signed, Donato SchultzMark Teresa Lemmerman, MD [facility] Center[facility]  03/29/2015 2:01 PM

## 2015-03-29 NOTE — Patient Instructions (Signed)
Medication Instructions:  Please stop your Lisinopril and start Losartan 25 mg a day. Continue all other medications as listed.  Follow-Up: Follow up in 6 months with Dr. Anne FuSkains.  You will receive a letter in the mail 2 months before you are due.  Please call us when you receive this letter to schedule your follow up appointment.  Thank you for choosing [facility]!!

## 2015-04-03 ENCOUNTER — Other Ambulatory Visit: Payer: Self-pay | Admitting: Internal Medicine

## 2015-04-04 ENCOUNTER — Other Ambulatory Visit: Payer: Self-pay | Admitting: Internal Medicine

## 2015-04-11 ENCOUNTER — Other Ambulatory Visit: Payer: Self-pay | Admitting: Internal Medicine

## 2015-04-12 ENCOUNTER — Other Ambulatory Visit: Payer: Self-pay | Admitting: Internal Medicine

## 2015-04-24 ENCOUNTER — Ambulatory Visit: Payer: Medicare Other | Admitting: Nurse Practitioner

## 2015-04-26 ENCOUNTER — Other Ambulatory Visit: Payer: Self-pay | Admitting: Cardiology

## 2015-05-02 ENCOUNTER — Other Ambulatory Visit: Payer: Self-pay | Admitting: Internal Medicine

## 2015-05-07 ENCOUNTER — Other Ambulatory Visit: Payer: Self-pay | Admitting: Cardiology

## 2015-05-22 NOTE — Telephone Encounter (Signed)
Nurse called patient to ask about hydroxyzine refill request.  No answer at both numbers. Unable to leave message, no voicemail.  Hydroxyzine filled by historical provider. Medication will not be refilled.  Patient needs appointment with PCP.

## 2015-06-04 ENCOUNTER — Telehealth: Payer: Self-pay | Admitting: Neurology

## 2015-06-04 NOTE — Telephone Encounter (Signed)
Pt's daughter called to schedule appt,pt states her vision is getting worse, pt complains of a weird sensation over her whole body( mostly waist up). Feels like she will pass out, she doesn't pass out though, states it comes and goes no matter if she is sitter or standing. Please call and advise [phone]

## 2015-06-04 NOTE — Telephone Encounter (Signed)
I called the patient. She said it is hard to describe how she feels during the episodes she has been having. She said she almost feels like she is going to pass out. Last night when the episode happened her BP was 73/32. I advised her that is very low. She sees a cardiologist. I advised her to call him and let him know (she said she would). Her BP right now is 171/96 and HR is 59. She said her BP has been fluctuating quite a bit like that. She also states her vision is getting worse. She said it seems more narrow and sometimes it is very dark while others it is almost like a white haze. She has an appointment for tomorrow at 2 PM with Dr. Anne Hahn. I told her I would send him this note to make him aware of the symptoms she is having prior to her appointment.

## 2015-06-05 ENCOUNTER — Encounter: Payer: Self-pay | Admitting: Neurology

## 2015-06-05 ENCOUNTER — Ambulatory Visit (INDEPENDENT_AMBULATORY_CARE_PROVIDER_SITE_OTHER): Payer: Medicare Other | Admitting: Neurology

## 2015-06-05 VITALS — BP 153/76 | HR 57 | Ht 64.0 in | Wt 151.6 lb

## 2015-06-05 DIAGNOSIS — I69359 Hemiplegia and hemiparesis following cerebral infarction affecting unspecified side: Secondary | ICD-10-CM | POA: Insufficient documentation

## 2015-06-05 DIAGNOSIS — I69398 Other sequelae of cerebral infarction: Secondary | ICD-10-CM

## 2015-06-05 DIAGNOSIS — H5310 Unspecified subjective visual disturbances: Secondary | ICD-10-CM | POA: Diagnosis not present

## 2015-06-05 DIAGNOSIS — R55 Syncope and collapse: Secondary | ICD-10-CM

## 2015-06-05 DIAGNOSIS — I6330 Cerebral infarction due to thrombosis of unspecified cerebral artery: Secondary | ICD-10-CM

## 2015-06-05 HISTORY — DX: Hemiplegia and hemiparesis following cerebral infarction affecting unspecified side: I69.398

## 2015-06-05 HISTORY — DX: Hemiplegia and hemiparesis following cerebral infarction affecting unspecified side: I69.359

## 2015-06-05 NOTE — Progress Notes (Signed)
Reason for visit: History of stroke  Shannon [name] is an 64 y.o. female  History of present illness:  Shannon [name] is a 64 year old right-handed white female with a history of a right posterior cerebral artery distribution stroke associated with some left-sided numbness and a dense left homonymous visual field deficit. The patient has been under quite a bit of stress recently. Her husband died one month ago, and one of her grandchildren has a serious medical illness, and may die within the next several weeks. The patient has been under stress from her daughters. Within the last 3-4 days, she has been noting episodes of near-syncope, visual dimming, feeling generally poor. She will feel weak all over, no focal weakness is noted. The patient has not actually fainted. She denies palpitations of the heart. She denies that the episodes of visual dimming are related to any of her medications. She will lie down, and the patient still feels poorly for several hours. She has occasional headaches, she denies any slurred speech or loss of speech. She is followed by psychiatry through Dr. Donell Beers. On several occasions, she has noted that her blood pressure has been either too low or too high with the above events. The systolic pressures may go down into the 70 range. She comes into the office today for an evaluation.  Past Medical History  Diagnosis Date  . Hypertension   . Coronary artery disease     a. s/p PCI with DES to LAD in 2011  (LHC 03/2010: Mid LAD 70%, normal ejection fraction  >> PCI:  2.75 x 15 mm Promus DES to LAD); b. Nuclear Stress Test 05/29/14: No ischemia, EF 59%, Normal; c. NSTEMI 11/2014 >> patient declined LHC b/c of recent CVA  . Diabetes mellitus without complication   . Arthritis   . Irritable bowel   . Anxiety   . Depression   . Fibromyalgia 1995  . Hx of echocardiogram     a. Echocardiogram 10/25/14: Moderate LVH, EF 50-55%, normal wall motion, grade 2 diastolic dysfunction,  mild LAE, atrial septal lipomatous hypertrophy  . H/O ischemic right PCA stroke 10/2014    a. Carotid US 10/24/14: No significant ICA stenosis bilaterally  . Dyslipidemia 11/20/2014  . Hemiparesis and alteration of sensations as late effects of stroke 06/05/2015    Past Surgical History  Procedure Laterality Date  . Coronary stent placement    . Cholecystectomy    . Orif arm fracture Right     forearm    Family History  Problem Relation Age of Onset  . Cancer Mother   . Diabetes Mother   . Hypertension Mother   . Heart failure Mother   . CAD Mother   . Cancer Father   . Diabetes Father   . Hypertension Father   . Heart failure Father   . CAD Father   . CAD Brother   . Diabetes Brother   . Hypertension Brother   . CAD Sister   . Diabetes Sister   . Hypertension Sister   . Heart attack Father   . Heart attack Mother   . Heart attack Sister   . Heart attack Brother   . Stroke Father     Social history:  reports that she has never smoked. She has never used smokeless tobacco. She reports that she does not drink alcohol or use illicit drugs.    Allergies  Allergen Reactions  . Sulfa Antibiotics Other (See Comments)    Childhood allergy, can't  remember  . Lisinopril Cough    Medications:  Prior to Admission medications   Medication Sig Start Date End Date Taking? Authorizing Provider  ALPRAZolam Prudy Feeler) 0.5 MG tablet Take 0.5 mg by mouth at bedtime.  03/07/15  Yes Historical Provider, MD  amLODipine (NORVASC) 5 MG tablet Take 1 tablet (5 mg total) by mouth daily. 03/15/15  Yes Jake Bathe, MD  aspirin EC 81 MG tablet Take 1 tablet (81 mg total) by mouth daily. 11/27/14  Yes Scott Moishe Spice, PA-C  atorvastatin (LIPITOR) 40 MG tablet Take 1 tablet (40 mg total) by mouth daily. 11/27/14  Yes Beatrice Lecher, PA-C  clopidogrel (PLAVIX) 75 MG tablet Take 1 tablet (75 mg total) by mouth daily with breakfast. 05/16/14  Yes Jake Bathe, MD  gabapentin (NEURONTIN) 300 MG capsule  Take 300 mg by mouth daily as needed.  03/25/15  Yes Historical Provider, MD  glimepiride (AMARYL) 2 MG tablet Take 1 tablet (2 mg total) by mouth 2 (two) times daily. 07/17/14  Yes Ambrose Finland, NP  glucose blood test strip Check blood sugar before each meal and at bedtime total 4 times per day 07/19/14  Yes Ambrose Finland, NP  hydrOXYzine (ATARAX/VISTARIL) 10 MG tablet Take 10 mg by mouth 3 (three) times daily as needed for anxiety (take one tablet three times daily as needed for anxiety).   Yes Historical Provider, MD  isosorbide mononitrate (IMDUR) 30 MG 24 hr tablet TAKE 1 TABLET (30 MG TOTAL) BY MOUTH EVERY MORNING. 03/29/15  Yes Jake Bathe, MD  Lancets (FREESTYLE) lancets Check blood sugar four times per day. Before each meal and at bedtime 07/19/14  Yes Ambrose Finland, NP  levothyroxine (SYNTHROID, LEVOTHROID) 25 MCG tablet TAKE 1 TABLET (25 MCG TOTAL) BY MOUTH DAILY BEFORE BREAKFAST. 02/25/15  Yes Jake Bathe, MD  losartan (COZAAR) 25 MG tablet Take 1 tablet (25 mg total) by mouth daily. 03/29/15  Yes Jake Bathe, MD  methocarbamol (ROBAXIN) 500 MG tablet Take 1 tablet (500 mg total) by mouth 3 (three) times daily. Patient taking differently: Take 500 mg by mouth daily as needed.  01/17/15  Yes York Spaniel, MD  metoprolol tartrate (LOPRESSOR) 25 MG tablet Take 1 tablet (25 mg total) by mouth 2 (two) times daily. 12/07/14  Yes Ok Anis, NP  Multiple Vitamin (MULTIVITAMIN) capsule Take 1 capsule by mouth daily. Senior vitamin   Yes Historical Provider, MD  nitroGLYCERIN (NITROSTAT) 0.4 MG SL tablet Place 1 tablet (0.4 mg total) under the tongue every 5 (five) minutes as needed. 11/27/14  Yes Scott T Alben Spittle, PA-C  pantoprazole (PROTONIX) 40 MG tablet Take 1 tablet (40 mg total) by mouth daily. 11/27/14  Yes Scott T Alben Spittle, PA-C  temazepam (RESTORIL) 7.5 MG capsule Take 7.5 mg by mouth at bedtime.   Yes Historical Provider, MD  vitamin E (VITAMIN E) 400 UNIT capsule Take 400 Units by  mouth every morning.   Yes Historical Provider, MD    ROS:  Out of a complete 14 system review of symptoms, the patient complains only of the following symptoms, and all other reviewed systems are negative.  Loss of vision, blurred vision Cough, shortness of breath Palpitations of the heart Restless legs, insomnia Memory loss, dizziness, headache, weakness  Blood pressure 153/76, pulse 57, height 5\' 4"  (1.626 m), weight 151 lb 9.6 oz (68.765 kg).   Blood pressure, right arm, standing is 188/96. Blood pressure, sitting, right arm is 176/90.  Physical Exam  General: The patient is alert and cooperative at the time of the examination. The patient is somewhat tearful during the exam.  Skin: No significant peripheral edema is noted.   Neurologic Exam  Mental status: The patient is alert and oriented x 3 at the time of the examination. The patient has apparent normal recent and remote memory, with an apparently normal attention span and concentration ability.   Cranial nerves: Facial symmetry is present. Speech is normal, no aphasia or dysarthria is noted. Extraocular movements are full. Visual fields are notable for a dense left homonymous visual field deficit.  Motor: The patient has good strength in all 4 extremities.  Sensory examination: Soft touch sensation is reduced on the left face, arm, and leg.  Coordination: The patient has good finger-nose-finger and heel-to-shin bilaterally.  Gait and station: The patient has a slightly wide-based gait, the patient is able to ambulate independently, but she usually uses a quad cane. Tandem gait was not tested. Romberg is negative.  Reflexes: Deep tendon reflexes are symmetric.   Assessment/Plan:  1. Episodic visual dimming, near syncope  2. History of right posterior cerebral artery distribution stroke  3. Hypertension  4. Anxiety and depression  The patient is having increased stress, and she is having symptoms of visual  dimming, dizziness and near-syncope. These symptoms may be related to the stress itself, but the patient will undergo a bit further neurologic workup. She will have CT evaluation of the brain, and a carotid Doppler study. She will follow-up through this office for her regular appointment in November 2016. Her blood pressures are elevated today. I have asked her to contact her primary care physician regarding this.  Marlan Palau MD 06/06/2015 7:46 AM  [facility] [address] [place], [place] [address]  Phone [phone] Fax [phone]

## 2015-06-05 NOTE — Patient Instructions (Addendum)
We will check a head scan, and a carotid Doppler study looking at the circulation to the head. Your blood pressure was elevated today, your primary doctor needs to know about this.   Stroke Prevention Some medical conditions and behaviors are associated with an increased chance of having a stroke. You may prevent a stroke by making healthy choices and managing medical conditions. HOW CAN I REDUCE MY RISK OF HAVING A STROKE?   Stay physically active. Get at least 30 minutes of activity on most or all days.  Do not smoke. It may also be helpful to avoid exposure to secondhand smoke.  Limit alcohol use. Moderate alcohol use is considered to be:  No more than 2 drinks per day for men.  No more than 1 drink per day for nonpregnant women.  Eat healthy foods. This involves:  Eating 5 or more servings of fruits and vegetables a day.  Making dietary changes that address high blood pressure (hypertension), high cholesterol, diabetes, or obesity.  Manage your cholesterol levels.  Making food choices that are high in fiber and low in saturated fat, trans fat, and cholesterol may control cholesterol levels.  Take any prescribed medicines to control cholesterol as directed by your health care provider.  Manage your diabetes.  Controlling your carbohydrate and sugar intake is recommended to manage diabetes.  Take any prescribed medicines to control diabetes as directed by your health care provider.  Control your hypertension.  Making food choices that are low in salt (sodium), saturated fat, trans fat, and cholesterol is recommended to manage hypertension.  Take any prescribed medicines to control hypertension as directed by your health care provider.  Maintain a healthy weight.  Reducing calorie intake and making food choices that are low in sodium, saturated fat, trans fat, and cholesterol are recommended to manage weight.  Stop drug abuse.  Avoid taking birth control  pills.  Talk to your health care provider about the risks of taking birth control pills if you are over 39 years old, smoke, get migraines, or have ever had a blood clot.  Get evaluated for sleep disorders (sleep apnea).  Talk to your health care provider about getting a sleep evaluation if you snore a lot or have excessive sleepiness.  Take medicines only as directed by your health care provider.  For some people, aspirin or blood thinners (anticoagulants) are helpful in reducing the risk of forming abnormal blood clots that can lead to stroke. If you have the irregular heart rhythm of atrial fibrillation, you should be on a blood thinner unless there is a good reason you cannot take them.  Understand all your medicine instructions.  Make sure that other conditions (such as anemia or atherosclerosis) are addressed. SEEK IMMEDIATE MEDICAL CARE IF:   You have sudden weakness or numbness of the face, arm, or leg, especially on one side of the body.  Your face or eyelid droops to one side.  You have sudden confusion.  You have trouble speaking (aphasia) or understanding.  You have sudden trouble seeing in one or both eyes.  You have sudden trouble walking.  You have dizziness.  You have a loss of balance or coordination.  You have a sudden, severe headache with no known cause.  You have new chest pain or an irregular heartbeat. Any of these symptoms may represent a serious problem that is an emergency. Do not wait to see if the symptoms will go away. Get medical help at once. Call your local emergency  services (911 in [place] Do not drive yourself to the hospital. Document Released: 12/03/2004 Document Revised: 03/12/2014 Document Reviewed: 04/28/2013 [facility] Patient Information 2015 [place], [place]. This information is not intended to replace advice given to you by your health care provider. Make sure you discuss any questions you have with your health care provider.

## 2015-06-06 ENCOUNTER — Telehealth: Payer: Self-pay | Admitting: Cardiology

## 2015-06-06 DIAGNOSIS — R531 Weakness: Secondary | ICD-10-CM

## 2015-06-06 DIAGNOSIS — I10 Essential (primary) hypertension: Secondary | ICD-10-CM

## 2015-06-06 NOTE — Telephone Encounter (Addendum)
Patient called stating her "blood pressure is all over the place." 7/25: BP 160/91 7/26: BP 73/35 7/27: BP 172/102 7/28: BP 164/92 HR 63 (checked while on phone) The patient complains of intermittent lightheadedness and feeling like she is going to faint. She also complains of a "strange feeling" that comes over here occasionally that she cannot explain. She has no complaints of CP or SOB, but does feel "very weak" today. She also st her sister died 2 days ago and she is very sad about that. She has taken her medications as directed.  Spoke to [employer], [employer], who recommends the patient come in for BMET, CBC and EKG to rule out arrhythmia as it is unclear whether the patient's symptoms are cardiac or from emotional stress. She cannot come tomorrow for EKG but agrees to come Monday for nurse visit and lab work.  Instructed the patient to go to the ED in the meantime if her BP is too high or too low given her history of stroke. Patient agrees with treatment plan.

## 2015-06-06 NOTE — Telephone Encounter (Signed)
Pt c/o BP issue: STAT if pt c/o blurred vision, one-sided weakness or slurred speech  1. What are your last 5 BP readings? 145/93, 161/96, 160/91, 90/57, 73/35, 172/102  2. Are you having any other symptoms (ex. Dizziness, headache, blurred vision, passed out)? Blurred vision, light headed  3. What is your BP issue? Pt calling stating her bp is all over the place.

## 2015-06-10 ENCOUNTER — Other Ambulatory Visit (INDEPENDENT_AMBULATORY_CARE_PROVIDER_SITE_OTHER): Payer: Medicare Other | Admitting: *Deleted

## 2015-06-10 ENCOUNTER — Other Ambulatory Visit: Payer: Self-pay | Admitting: Cardiology

## 2015-06-10 ENCOUNTER — Ambulatory Visit (INDEPENDENT_AMBULATORY_CARE_PROVIDER_SITE_OTHER): Payer: Medicare Other

## 2015-06-10 ENCOUNTER — Other Ambulatory Visit: Payer: Self-pay | Admitting: Neurology

## 2015-06-10 VITALS — BP 131/72 | HR 68 | Ht 64.0 in | Wt 152.0 lb

## 2015-06-10 DIAGNOSIS — R531 Weakness: Secondary | ICD-10-CM | POA: Diagnosis not present

## 2015-06-10 DIAGNOSIS — I10 Essential (primary) hypertension: Secondary | ICD-10-CM

## 2015-06-10 LAB — CBC WITH DIFFERENTIAL/PLATELET
BASOS ABS: 0 10*3/uL (ref 0.0–0.1)
Basophils Relative: 0.4 % (ref 0.0–3.0)
Eosinophils Absolute: 0.3 10*3/uL (ref 0.0–0.7)
Eosinophils Relative: 3.1 % (ref 0.0–5.0)
HCT: 39.6 % (ref 36.0–46.0)
HEMOGLOBIN: 13.2 g/dL (ref 12.0–15.0)
LYMPHS PCT: 21.8 % (ref 12.0–46.0)
Lymphs Abs: 1.8 10*3/uL (ref 0.7–4.0)
MCHC: 33.4 g/dL (ref 30.0–36.0)
MCV: 85.5 fl (ref 78.0–100.0)
Monocytes Absolute: 0.6 10*3/uL (ref 0.1–1.0)
Monocytes Relative: 7.9 % (ref 3.0–12.0)
NEUTROS ABS: 5.5 10*3/uL (ref 1.4–7.7)
Neutrophils Relative %: 66.8 % (ref 43.0–77.0)
Platelets: 172 10*3/uL (ref 150.0–400.0)
RBC: 4.63 Mil/uL (ref 3.87–5.11)
RDW: 13.1 % (ref 11.5–15.5)
WBC: 8.2 10*3/uL (ref 4.0–10.5)

## 2015-06-10 LAB — BASIC METABOLIC PANEL
BUN: 10 mg/dL (ref 6–23)
CO2: 31 meq/L (ref 19–32)
CREATININE: 0.71 mg/dL (ref 0.40–1.20)
Calcium: 9.2 mg/dL (ref 8.4–10.5)
Chloride: 101 mEq/L (ref 96–112)
GFR: 87.52 mL/min (ref >60.00)
GLUCOSE: 244 mg/dL — AB (ref 70–99)
POTASSIUM: 4.1 meq/L (ref 3.5–5.1)
Sodium: 138 mEq/L (ref 135–145)

## 2015-06-10 NOTE — Telephone Encounter (Signed)
Per note 03/10

## 2015-06-10 NOTE — Progress Notes (Signed)
**Note De-Identified Raynald Rouillard Obfuscation** The pt arrives in the office for a BMET, CBCD and EKG. The pt called the office on 7/28 with c/o her BP being "all over the place" She stated at that time that she was very weak and having intermittent lightheadedness and feeling like she was going to faint. She was advised by Theodore Demark, flex, to come in that day to be seen but the pt could not make it on 7/28 because she had family issues she was dealing with so an appointment was made for today.  The pt is very grief stricken at this time by the loss of her husband on May 08, 2015, the loss of her sister in law last week and the loss of her 1 year old granddaughter yesterday (due to cancer).  EKG obtained and given to Norma Fredrickson, NP, flex, for her review. Per Lawson Fiscal the pt is advised that her EKG and BP is ok and that she should refer to her PCP to have her s/s evaluated.  Also, I advised the pt to refrain from checking her BP so often and to make sure she has taken her BP meds at least a hour before she checks her BP. The pt verbalized understanding and is in agreement.

## 2015-06-19 ENCOUNTER — Ambulatory Visit (INDEPENDENT_AMBULATORY_CARE_PROVIDER_SITE_OTHER): Payer: Medicare Other

## 2015-06-19 ENCOUNTER — Ambulatory Visit
Admission: RE | Admit: 2015-06-19 | Discharge: 2015-06-19 | Disposition: A | Discharge status: Home / Self Care | Payer: Medicare Other | Source: Ambulatory Visit | Attending: Neurology | Admitting: Neurology

## 2015-06-19 DIAGNOSIS — H5310 Unspecified subjective visual disturbances: Secondary | ICD-10-CM

## 2015-06-19 DIAGNOSIS — I69359 Hemiplegia and hemiparesis following cerebral infarction affecting unspecified side: Secondary | ICD-10-CM

## 2015-06-19 DIAGNOSIS — R55 Syncope and collapse: Secondary | ICD-10-CM

## 2015-06-19 DIAGNOSIS — I69398 Other sequelae of cerebral infarction: Principal | ICD-10-CM

## 2015-06-20 ENCOUNTER — Telehealth: Payer: Self-pay | Admitting: Neurology

## 2015-06-20 NOTE — Telephone Encounter (Signed)
I called the patient. CT of the head is stable, carotid Doppler study has been done, I do not have the report of this yet. I will call patient when I have the report.   CT head 06/20/2015:  IMPRESSION:  Abnormal CT head (without) demonstrating: 1. Chronic right posterior cerebral artery infarction. 2. No acute findings. 3. Compared to MRI on 01/12/15, there are no significant new findings.

## 2015-06-25 ENCOUNTER — Telehealth: Payer: Self-pay | Admitting: Neurology

## 2015-06-25 NOTE — Telephone Encounter (Signed)
Shannon [name]/Dr Chilton Si (PCP) [phone] called regarding CT results. She would like for someone to call her regarding the results.

## 2015-06-25 NOTE — Telephone Encounter (Signed)
I called the office of Dr. Chilton Si. The CT scan of brain done was stable from March 2016, no new stroke events noted. I have faxed over a written report.

## 2015-06-26 ENCOUNTER — Telehealth: Payer: Self-pay

## 2015-06-26 NOTE — Telephone Encounter (Signed)
Nurse called patient, reached voicemail. Left message for patient to call Izac Faulkenberry with [facility], at [phone]. Patient had requested refills of glimepiride and hydroxyzine on 04/11/15. Nurse can not fill prescriptions.  Patient needs appointment.

## 2015-07-17 ENCOUNTER — Other Ambulatory Visit: Payer: Self-pay | Admitting: Cardiology

## 2015-07-17 NOTE — Telephone Encounter (Signed)
Ok to refill or should [facility] be [facility] to [facility] who monitors patients tsh? Please advise. Thanks, MI

## 2015-07-20 ENCOUNTER — Other Ambulatory Visit: Payer: Self-pay | Admitting: Cardiology

## 2015-07-23 ENCOUNTER — Other Ambulatory Visit: Payer: Self-pay | Admitting: Cardiology

## 2015-07-24 NOTE — Telephone Encounter (Signed)
Originally prescribed by pcp. Ok to refill or defer to pcp? Please advise. Thanks, MI

## 2015-07-26 ENCOUNTER — Telehealth: Payer: Self-pay

## 2015-07-29 NOTE — Telephone Encounter (Signed)
Pt should obtain from PCP.  We do not follow her thyroid function.

## 2015-07-30 ENCOUNTER — Other Ambulatory Visit: Payer: Self-pay | Admitting: Cardiology

## 2015-09-09 ENCOUNTER — Other Ambulatory Visit: Payer: Self-pay | Admitting: Internal Medicine

## 2015-09-30 ENCOUNTER — Encounter: Payer: Self-pay | Admitting: Neurology

## 2015-09-30 ENCOUNTER — Ambulatory Visit (INDEPENDENT_AMBULATORY_CARE_PROVIDER_SITE_OTHER): Payer: Medicare Other | Admitting: Neurology

## 2015-09-30 VITALS — BP 151/97 | HR 92 | Ht 64.0 in | Wt 157.5 lb

## 2015-09-30 DIAGNOSIS — R413 Other amnesia: Secondary | ICD-10-CM | POA: Diagnosis not present

## 2015-09-30 DIAGNOSIS — I6330 Cerebral infarction due to thrombosis of unspecified cerebral artery: Secondary | ICD-10-CM

## 2015-09-30 DIAGNOSIS — I69398 Other sequelae of cerebral infarction: Secondary | ICD-10-CM | POA: Diagnosis not present

## 2015-09-30 DIAGNOSIS — H539 Unspecified visual disturbance: Secondary | ICD-10-CM | POA: Diagnosis not present

## 2015-09-30 DIAGNOSIS — R200 Anesthesia of skin: Secondary | ICD-10-CM

## 2015-09-30 DIAGNOSIS — I69359 Hemiplegia and hemiparesis following cerebral infarction affecting unspecified side: Secondary | ICD-10-CM

## 2015-09-30 DIAGNOSIS — M797 Fibromyalgia: Secondary | ICD-10-CM | POA: Diagnosis not present

## 2015-09-30 NOTE — Patient Instructions (Signed)
Visual Disturbances °You have had a disturbance in your vision. This may be caused by various conditions, such as: °· Migraines. Migraine headaches are often preceded by a disturbance in vision. Blind spots or light flashes are followed by a headache. This type of visual disturbance is temporary. It does not damage the eye. °· Glaucoma. This is caused by increased pressure in the eye. Symptoms include haziness, blurred vision, or seeing rainbow colored circles when looking at bright lights. Partial or complete visual loss can occur. You may or may not experience eye pain. Visual loss may be gradual or sudden and is irreversible. Glaucoma is the leading cause of blindness. °· Retina problems. Vision will be reduced if the retina becomes detached or if there is a circulation problem as with diabetes, high blood pressure, or a mini-stroke. Symptoms include seeing "floaters," flashes of light, or shadows, as if a curtain has fallen over your eye. °· Optic nerve problems. The main nerve in your eye can be damaged by redness, soreness, and swelling (inflammation), poor circulation, drugs, and toxins. °It is very important to have a complete exam done by a specialist to determine the exact cause of your eye problem. The specialist may recommend medicines or surgery, depending on the cause of the problem. This can help prevent further loss of vision or reduce the risk of having a stroke. Contact the caregiver to whom you have been referred and arrange for follow-up care right away. °SEEK IMMEDIATE MEDICAL CARE IF:  °· Your vision gets worse. °· You develop severe headaches. °· You have any weakness or numbness in the face, arms, or legs. °· You have any trouble speaking or walking. °  °This information is not intended to replace advice given to you by your health care provider. Make sure you discuss any questions you have with your health care provider. °  °Document Released: 12/03/2004 Document Revised: 01/18/2012 Document  Reviewed: 04/04/2014 °Elsevier Interactive Patient Education ©2016 [employer] ° °

## 2015-09-30 NOTE — Progress Notes (Signed)
Reason for visit: History of stroke  Shannon [name] is an 64 y.o. female  History of present illness:  Shannon [name] is a 64 year old right-handed white female with a history of cerebrovascular disease with a right posterior cerebral artery distribution stroke. The patient has a dense left homonymous visual field deficit. She has some mild sensory changes on the left side of body. She uses a quad cane for ambulation, and she denies any falls. The patient continues to report variations in vision, with episodic visual dimming, no loss of vision. The patient has diabetes and hypertension. She has difficulty controlling these issues. She is living essentially alone, her caretaker is not present at most times, she cannot operate a motor vehicle, she is spending most of her day inside the house, she has difficulty reading and watching TV. She has not been very active. She is followed by Dr. Donell Beers for her depression. She has had a carotid doppler study that was unremarkable, a repeat CT scan was done during the summer 2016 that showed good stability with the stroke issue. She returns for an evaluation.  Past Medical History  Diagnosis Date  . Hypertension   . Coronary artery disease     a. s/p PCI with DES to LAD in 2011  (LHC 03/2010: Mid LAD 70%, normal ejection fraction  >> PCI:  2.75 x 15 mm Promus DES to LAD); b. Nuclear Stress Test 05/29/14: No ischemia, EF 59%, Normal; c. NSTEMI 11/2014 >> patient declined LHC b/c of recent CVA  . Diabetes mellitus without complication (HCC)   . Arthritis   . Irritable bowel   . Anxiety   . Depression   . Fibromyalgia 1995  . Hx of echocardiogram     a. Echocardiogram 10/25/14: Moderate LVH, EF 50-55%, normal wall motion, grade 2 diastolic dysfunction, mild LAE, atrial septal lipomatous hypertrophy  . H/O ischemic right PCA stroke 10/2014    a. Carotid US 10/24/14: No significant ICA stenosis bilaterally  . Dyslipidemia 11/20/2014  . Hemiparesis and  alteration of sensations as late effects of stroke (HCC) 06/05/2015    Past Surgical History  Procedure Laterality Date  . Coronary stent placement    . Cholecystectomy    . Orif arm fracture Right     forearm    Family History  Problem Relation Age of Onset  . Cancer Mother   . Diabetes Mother   . Hypertension Mother   . Heart failure Mother   . CAD Mother   . Heart attack Mother   . Cancer Father   . Diabetes Father   . Hypertension Father   . Heart failure Father   . CAD Father   . Heart attack Father   . Stroke Father   . CAD Brother   . Diabetes Brother   . Hypertension Brother   . CAD Sister   . Diabetes Sister   . Hypertension Sister   . Heart attack Sister   . Heart attack Brother   . Stroke Maternal Aunt     Social history:  reports that she has never smoked. She has never used smokeless tobacco. She reports that she does not drink alcohol or use illicit drugs.    Allergies  Allergen Reactions  . Sulfa Antibiotics Other (See Comments)    Childhood allergy, can't remember  . Lisinopril Cough    Medications:  Prior to Admission medications   Medication Sig Start Date End Date Taking? Authorizing Provider  ALPRAZolam Prudy Feeler) 0.5 MG  tablet Take 0.5 mg by mouth at bedtime.  03/07/15  Yes Historical Provider, MD  amLODipine (NORVASC) 5 MG tablet TAKE 1 TABLET (5 MG TOTAL) BY MOUTH DAILY. 07/24/15  Yes Jake BatheMark C Skains, MD  Ascorbic Acid (VITAMIN C) 100 MG tablet Take 100 mg by mouth daily.   Yes Historical Provider, MD  aspirin EC 81 MG tablet Take 1 tablet (81 mg total) by mouth daily. 11/27/14  Yes Scott Moishe Spice Weaver, PA-C  atorvastatin (LIPITOR) 40 MG tablet Take 1 tablet (40 mg total) by mouth daily. 11/27/14  Yes Beatrice LecherScott T Weaver, PA-C  Cholecalciferol (VITAMIN D-3) 1000 UNITS CAPS Take 1 capsule by mouth daily.   Yes Historical Provider, MD  clopidogrel (PLAVIX) 75 MG tablet TAKE 1 TABLET (75 MG TOTAL) BY MOUTH DAILY WITH BREAKFAST. 06/11/15  Yes Jake BatheMark C Skains, MD    FREESTYLE LITE test strip USE 3 TIMES DAILY AS INSTRUCTED 09/17/15  Yes Ambrose FinlandValerie A Keck, NP  GARLIC PO Take 1 capsule by mouth daily.   Yes Historical Provider, MD  glimepiride (AMARYL) 2 MG tablet Take 1 tablet (2 mg total) by mouth 2 (two) times daily. 07/17/14  Yes Ambrose FinlandValerie A Keck, NP  glucose blood test strip Check blood sugar before each meal and at bedtime total 4 times per day 07/19/14  Yes Ambrose FinlandValerie A Keck, NP  hydrOXYzine (ATARAX/VISTARIL) 10 MG tablet Take 10 mg by mouth 3 (three) times daily as needed for anxiety (take one tablet three times daily as needed for anxiety).   Yes Historical Provider, MD  isosorbide mononitrate (IMDUR) 30 MG 24 hr tablet TAKE 1 TABLET (30 MG TOTAL) BY MOUTH EVERY MORNING. 03/29/15  Yes Jake BatheMark C Skains, MD  Lancets (FREESTYLE) lancets Check blood sugar four times per day. Before each meal and at bedtime 07/19/14  Yes Ambrose FinlandValerie A Keck, NP  levothyroxine (SYNTHROID, LEVOTHROID) 25 MCG tablet TAKE 1 TABLET (25 MCG TOTAL) BY MOUTH DAILY BEFORE BREAKFAST. 07/30/15  Yes Jake BatheMark C Skains, MD  losartan (COZAAR) 25 MG tablet Take 1 tablet (25 mg total) by mouth daily. 03/29/15  Yes Jake BatheMark C Skains, MD  methocarbamol (ROBAXIN) 500 MG tablet TAKE 1 TABLET (500 MG TOTAL) BY MOUTH 3 (THREE) TIMES DAILY. 06/10/15  Yes York Spanielharles K Willis, MD  metoprolol tartrate (LOPRESSOR) 25 MG tablet Take 1 tablet (25 mg total) by mouth 2 (two) times daily. 12/07/14  Yes Ok Anishristopher R Berge, NP  Multiple Vitamin (MULTIVITAMIN) capsule Take 1 capsule by mouth daily. Senior vitamin   Yes Historical Provider, MD  nitroGLYCERIN (NITROSTAT) 0.4 MG SL tablet Place 1 tablet (0.4 mg total) under the tongue every 5 (five) minutes as needed. 11/27/14  Yes Scott T Alben SpittleWeaver, PA-C  pantoprazole (PROTONIX) 40 MG tablet Take 1 tablet (40 mg total) by mouth daily. 11/27/14  Yes Scott T Alben SpittleWeaver, PA-C  temazepam (RESTORIL) 7.5 MG capsule Take 7.5 mg by mouth at bedtime.   Yes Historical Provider, MD  vitamin E (VITAMIN E) 400 UNIT  capsule Take 400 Units by mouth every morning.   Yes Historical Provider, MD    ROS:  Out of a complete 14 system review of symptoms, the patient complains only of the following symptoms, and all other reviewed systems are negative.  Fatigue Ear pain, ringing in the ears Loss of vision, blurred vision Nausea Insomnia Back pain, neck pain Memory loss, dizziness, numbness, weakness Depression, anxiety  Blood pressure 151/97, pulse 92, height 5\' 4"  (1.626 m), weight 157 lb 8 oz (71.442 kg).   Blood  pressure, right arm, sitting is 154 systolic. Blood pressure, right arm, standing is 162 systolic.  Physical Exam  General: The patient is alert and cooperative at the time of the examination.  Skin: No significant peripheral edema is noted.   Neurologic Exam  Mental status: The patient is alert and oriented x 3 at the time of the examination. The patient has apparent normal recent and remote memory, with an apparently normal attention span and concentration ability.   Cranial nerves: Facial symmetry is present. Speech is normal, no aphasia or dysarthria is noted. Extraocular movements are full. Visual fields are notable for a dense left homonymous visual field deficit.  Motor: The patient has good strength in all 4 extremities.  Sensory examination: Soft touch sensation is slightly decreased on the left face, arm, and leg relative to the right.  Coordination: The patient has good finger-nose-finger and heel-to-shin bilaterally.  Gait and station: The patient has a slightly wide-based, unsteady gait. The patient uses a quad cane for ambulation. Tandem gait was not tested Romberg is negative. No drift is seen.  Reflexes: Deep tendon reflexes are symmetric.   MRI brain 01/14/15:  IMPRESSION: This is an abnormal MRI of the brain without contrast showing a remote right PCA territory stroke involving the right parietal and temporal lobes. There is no hemorrhage. When compared to a  prior study dated 11/18/2014, there has been evolution of the then subacute stroke with more encephalomalacia and much less restricted diffusion noted on the current study.  * MRI scan images were reviewed online. I agree with the written report.   Assessment/Plan:  1. History of right posterior cerebral artery distribution stroke  2. Left homonymous visual field deficit  The patient continues to have a fixed visual field deficit, she reports some variations in visual dimming throughout the day. The etiology of this is not clear. This has been a persistent feature since the original stroke. At this point, I have very little to offer the patient. Her studies that showed good stability of the stroke itself. She is having some issues with depression, she does have a psychiatrist. She will follow-up through this office on an as-needed basis.  Marlan Palau MD 09/30/2015 7:44 PM  [facility] [address] [place], [place] [address]  Phone [phone] Fax [phone]

## 2015-10-07 ENCOUNTER — Other Ambulatory Visit: Payer: Self-pay | Admitting: Cardiology

## 2015-10-07 ENCOUNTER — Other Ambulatory Visit: Payer: Self-pay | Admitting: Nurse Practitioner

## 2015-11-15 ENCOUNTER — Ambulatory Visit (INDEPENDENT_AMBULATORY_CARE_PROVIDER_SITE_OTHER): Payer: Medicare Other | Admitting: Cardiology

## 2015-11-15 ENCOUNTER — Encounter: Payer: Self-pay | Admitting: Cardiology

## 2015-11-15 VITALS — BP 114/72 | HR 86 | Ht 63.5 in | Wt 160.8 lb

## 2015-11-15 DIAGNOSIS — I10 Essential (primary) hypertension: Secondary | ICD-10-CM | POA: Diagnosis not present

## 2015-11-15 DIAGNOSIS — I2510 Atherosclerotic heart disease of native coronary artery without angina pectoris: Secondary | ICD-10-CM

## 2015-11-15 DIAGNOSIS — Z9861 Coronary angioplasty status: Secondary | ICD-10-CM

## 2015-11-15 NOTE — Patient Instructions (Signed)

## 2015-11-15 NOTE — Progress Notes (Signed)
[address] [place], [place]  [address] Phone: [phone] Fax:  [phone]  Date:  11/15/2015   ID:  Shannon [name], DOB Aug 26, 1951, MRN [id number]  PCP:  Enrique Sack, MD   History of Present Illness: Shannon [name] is a 65 y.o. female with coronary artery disease, LAD stent in 2011, recent non-STEMI in 2016, January, declined left heart catheterization because of stroke resulting in eye/vision changes with interval bowel syndrome, anxiety, fibromyalgia, diabetes here for follow-up.  She had been very sad currently because her granddaughter of 49 years old died Ewing sarcoma.  At prior visit, she was seen by Ward Givens for bradycardia. Her heart rate was 47. She had trouble increasing heart rate in clinic during walking. She increased it to 64 bpm. Her metoprolol was decreased.  She sees Dr. Anne Hahn for her right posterior cerebral artery stroke as well as bilateral middle cerebral artery atherosclerotic changes. Left visual field defect. Continuing aspirin and Plavix. during periods of emotional stress, she will have decreased vision.   Heart rate is improved. Today she is not tearful.     Wt Readings from Last 3 Encounters:  11/15/15 160 lb 12.8 oz (72.938 kg)  09/30/15 157 lb 8 oz (71.442 kg)  06/10/15 152 lb (68.947 kg)     Past Medical History  Diagnosis Date  . Hypertension   . Coronary artery disease     a. s/p PCI with DES to LAD in 2011  (LHC 03/2010: Mid LAD 70%, normal ejection fraction  >> PCI:  2.75 x 15 mm Promus DES to LAD); b. Nuclear Stress Test 05/29/14: No ischemia, EF 59%, Normal; c. NSTEMI 11/2014 >> patient declined LHC b/c of recent CVA  . Diabetes mellitus without complication (HCC)   . Arthritis   . Irritable bowel   . Anxiety   . Depression   . Fibromyalgia 1995  . Hx of echocardiogram     a. Echocardiogram 10/25/14: Moderate LVH, EF 50-55%, normal wall motion, grade 2 diastolic dysfunction, mild LAE, atrial septal  lipomatous hypertrophy  . H/O ischemic right PCA stroke 10/2014    a. Carotid US 10/24/14: No significant ICA stenosis bilaterally  . Dyslipidemia 11/20/2014  . Hemiparesis and alteration of sensations as late effects of stroke (HCC) 06/05/2015    Past Surgical History  Procedure Laterality Date  . Coronary stent placement    . Cholecystectomy    . Orif arm fracture Right     forearm    Current Outpatient Prescriptions  Medication Sig Dispense Refill  . ALPRAZolam (XANAX) 0.5 MG tablet Take 0.5 mg by mouth at bedtime.     Marland Kitchen amLODipine (NORVASC) 5 MG tablet TAKE 1 TABLET (5 MG TOTAL) BY MOUTH DAILY. 30 tablet 11  . Ascorbic Acid (VITAMIN C) 100 MG tablet Take 100 mg by mouth daily.    Marland Kitchen aspirin EC 81 MG tablet Take 1 tablet (81 mg total) by mouth daily.    Marland Kitchen atorvastatin (LIPITOR) 40 MG tablet Take 1 tablet (40 mg total) by mouth daily. 30 tablet 11  . Cholecalciferol (VITAMIN D-3) 1000 UNITS CAPS Take 1 capsule by mouth daily.    . clopidogrel (PLAVIX) 75 MG tablet TAKE 1 TABLET (75 MG TOTAL) BY MOUTH DAILY WITH BREAKFAST. 30 tablet 5  . FREESTYLE LITE test strip USE 3 TIMES DAILY AS INSTRUCTED 50 each 11  . GARLIC PO Take 1 capsule by mouth daily.    Marland Kitchen glimepiride (AMARYL)  2 MG tablet Take 1 tablet (2 mg total) by mouth 2 (two) times daily. 60 tablet 3  . glucose blood test strip Check blood sugar before each meal and at bedtime total 4 times per day 100 each 12  . hydrOXYzine (ATARAX/VISTARIL) 10 MG tablet Take 10 mg by mouth 3 (three) times daily as needed for anxiety (take one tablet three times daily as needed for anxiety).    . isosorbide mononitrate (IMDUR) 30 MG 24 hr tablet TAKE 1 TABLET (30 MG TOTAL) BY MOUTH EVERY MORNING. 30 tablet 11  . Lancets (FREESTYLE) lancets Check blood sugar four times per day. Before each meal and at bedtime 100 each 12  . levothyroxine (SYNTHROID, LEVOTHROID) 25 MCG tablet TAKE 1 TABLET (25 MCG TOTAL) BY MOUTH DAILY BEFORE BREAKFAST. 30 tablet 1  .  losartan (COZAAR) 25 MG tablet Take 25 mg by mouth 2 (two) times daily.    . methocarbamol (ROBAXIN) 500 MG tablet TAKE 1 TABLET (500 MG TOTAL) BY MOUTH 3 (THREE) TIMES DAILY. 90 tablet 0  . metoprolol tartrate (LOPRESSOR) 25 MG tablet TAKE 1 TABLET (25 MG TOTAL) BY MOUTH 2 (TWO) TIMES DAILY. 180 tablet 1  . Multiple Vitamin (MULTIVITAMIN) capsule Take 1 capsule by mouth daily. Senior vitamin    . nitroGLYCERIN (NITROSTAT) 0.4 MG SL tablet Place 1 tablet (0.4 mg total) under the tongue every 5 (five) minutes as needed. 25 tablet 3  . pantoprazole (PROTONIX) 40 MG tablet Take 1 tablet (40 mg total) by mouth daily. 30 tablet 11  . temazepam (RESTORIL) 7.5 MG capsule Take 7.5 mg by mouth at bedtime.    . vitamin E (VITAMIN E) 400 UNIT capsule Take 400 Units by mouth every morning.    . [DISCONTINUED] isosorbide dinitrate (ISORDIL) 30 MG tablet Take 1 tablet (30 mg total) by mouth every morning. 30 tablet 2   No current facility-administered medications for this visit.    Allergies:    Allergies  Allergen Reactions  . Sulfa Antibiotics Other (See Comments)    Childhood allergy, can't remember  . Lisinopril Cough    Social History:  The patient  reports that she has never smoked. She has never used smokeless tobacco. She reports that she does not drink alcohol or use illicit drugs.   Family History  Problem Relation Age of Onset  . Cancer Mother   . Diabetes Mother   . Hypertension Mother   . Heart failure Mother   . CAD Mother   . Heart attack Mother   . Cancer Father   . Diabetes Father   . Hypertension Father   . Heart failure Father   . CAD Father   . Heart attack Father   . Stroke Father   . CAD Brother   . Diabetes Brother   . Hypertension Brother   . CAD Sister   . Diabetes Sister   . Hypertension Sister   . Heart attack Sister   . Heart attack Brother   . Stroke Maternal Aunt     ROS:  Please see the history of present illness.   Still having visual difficulties.    All other systems reviewed and negative.   PHYSICAL EXAM: VS:  BP 114/72 mmHg  Pulse 86  Ht 5' 3.5" (1.613 m)  Wt 160 lb 12.8 oz (72.938 kg)  BMI 28.03 kg/m2  SpO2 98% Well nourished, well developed, in no acute distress HEENT: Left eye visual changes Neck: no JVD, normal carotid upstroke, no bruit Cardiac:  normal S1, S2; RRR; no murmur Lungs:  clear to auscultation bilaterally, no wheezing, rhonchi or rales Abd: soft, nontender, no hepatomegaly, no bruits Ext: no edema, 2+ distal pulses Skin: warm and dry GU: deferred Neuro: no focal abnormalities noted, AAO x 3  EKG:  None today, prior sinus bradycardia     - LHC 03/2010: Mid LAD 70%, normal ejection fraction >> PCI: 2.75 x 15 mm Promus DES to LAD  - Nuclear Stress Test 05/29/14: No ischemia, EF 59%, Normal  - Carotid US 10/24/14: No significant ICA stenosis bilaterally  - Echocardiogram 10/25/14: Moderate LVH, EF 50-55%, normal wall motion, grade 2 diastolic dysfunction, mild LAE, atrial septal lipomatous hypertrophy   ASSESSMENT AND PLAN:  Bradycardia-resolved after decreasing her metoprolol. Doing well. Recent stroke posterior, left visual field defect-this is her main concern.  She has intermittent visual changes, dim appearance  Coronary artery disease-stable LAD stent, drug-eluting 2011. Continuing with both aspirin and Plavix because of stroke. I would be inclined at this point to refrain from proceeding with heart catheterization, She has been still highly concerned about the possibility of harm from catheterization, i.e. worsening stroke. I'm fine with holding off at this point in treating aggressive medication management. Remember, prior nuclear stress test showed no ischemia.  Essential hypertension  - Well controlled on current medications.  Diabetes mellitus  - Per Dr. Chilton SiGreen.  6 month follow up.  Signed, Donato SchultzMark Tkeya Stencil, MD [facility]  11/15/2015 3:03 PM

## 2015-11-29 ENCOUNTER — Other Ambulatory Visit: Payer: Self-pay | Admitting: Physician Assistant

## 2015-12-03 ENCOUNTER — Other Ambulatory Visit: Payer: Self-pay | Admitting: Physician Assistant

## 2015-12-03 NOTE — Telephone Encounter (Signed)
Ok to refill, or should this be deferred to pcp?

## 2015-12-04 NOTE — Telephone Encounter (Signed)
Please refer to PCP 

## 2016-02-20 ENCOUNTER — Other Ambulatory Visit: Payer: Self-pay | Admitting: Cardiology

## 2016-03-09 ENCOUNTER — Telehealth: Payer: Self-pay | Admitting: Neurology

## 2016-03-09 NOTE — Telephone Encounter (Signed)
I called the patient, left a message, I will call back later. 

## 2016-03-09 NOTE — Telephone Encounter (Signed)
I called and left messages that the patient's home, cell phone, that I called the caretaker. The caretaker indicates that she had a feeling of a device on the left foot last night, some headache. She feels better this morning, she wants to be seen soon this week. I will try to call the patient back at some point today.

## 2016-03-09 NOTE — Telephone Encounter (Signed)
Pt/caregiver called back this afternoon and 1st available appt scheduled 04/24/16 by phone room.

## 2016-03-09 NOTE — Telephone Encounter (Signed)
Returned pt TC. No answer @ home or on cell #. Left VM mssg to stress importance of going to ER if she feels that she is having stroke-like symptoms. Called Lupita LeashDonna (friend/caregiver) listed on DPR. She said that pt is now living with her daughter and would try to get in touch w/ her and/or pt. Verbalized understanding of ER recommendation. Will call back to schedule appt.

## 2016-03-09 NOTE — Telephone Encounter (Signed)
Pt called due to concerns last night: she says she has vision disturbances, Stabbing , burning pain moving from right to left in head. She feels she might have had a stroke due to same symptoms last time. She wanted to come in to be seen by physician. Pt was advised to go to ER if she felt she had a stroke. Pt stated, " I did not want to call the ambulance, it was dark out and I don't drive. " Pt wants to come in as soon as possible. [phone]

## 2016-03-10 NOTE — Telephone Encounter (Signed)
I called the patient again and left messages at home and on cell. She is to call us back if there are any concerns.

## 2016-04-09 ENCOUNTER — Telehealth: Payer: Self-pay | Admitting: Cardiology

## 2016-04-09 NOTE — Telephone Encounter (Signed)
New message   *STAT* If patient is at the pharmacy, call can be transferred to refill team.   1. Which medications need to be refilled? (please list name of each medication and dose if known) isosorbide mononitrate (IMDUR) 30 MG 24 hr tablet  2. Which pharmacy/location (including street and city if local pharmacy) is medication to be sent to? [facility] [address]   3. Do they need a 30 day or 90 day supply? 30 day

## 2016-04-14 ENCOUNTER — Other Ambulatory Visit: Payer: Self-pay | Admitting: *Deleted

## 2016-04-14 MED ORDER — ISOSORBIDE MONONITRATE ER 30 MG PO TB24: ORAL_TABLET | ORAL | Status: DC

## 2016-04-17 ENCOUNTER — Encounter: Payer: Self-pay | Admitting: Neurology

## 2016-04-24 ENCOUNTER — Ambulatory Visit: Payer: Medicare Other | Admitting: Neurology

## 2016-05-04 ENCOUNTER — Telehealth: Payer: Self-pay | Admitting: Neurology

## 2016-05-04 NOTE — Telephone Encounter (Signed)
Pt called in to r/s her appt. During conversation she mentioned both of her feet are swollen. Her left is worse though. She also said she can not see her ankles for the couple of days. No pain just tightness. Please call and advise [phone]

## 2016-05-04 NOTE — Telephone Encounter (Signed)
Returned pt TC. No answer, left VM mssg. Advised to f/u w/ cardiologist for increased swelling. Also offered sooner appt tomorrow (6/27) at noon. Pt may call back in the morning if she would like to schedule.

## 2016-06-09 ENCOUNTER — Ambulatory Visit: Payer: Medicare Other | Admitting: Cardiology

## 2016-06-24 ENCOUNTER — Ambulatory Visit: Payer: Medicare Other | Admitting: Neurology

## 2016-07-02 ENCOUNTER — Encounter: Payer: Self-pay | Admitting: Cardiology

## 2016-07-02 ENCOUNTER — Ambulatory Visit (INDEPENDENT_AMBULATORY_CARE_PROVIDER_SITE_OTHER): Payer: Medicare Other | Admitting: Cardiology

## 2016-07-02 DIAGNOSIS — I639 Cerebral infarction, unspecified: Secondary | ICD-10-CM | POA: Diagnosis not present

## 2016-07-02 DIAGNOSIS — Z9861 Coronary angioplasty status: Secondary | ICD-10-CM | POA: Diagnosis not present

## 2016-07-02 DIAGNOSIS — I2510 Atherosclerotic heart disease of native coronary artery without angina pectoris: Secondary | ICD-10-CM | POA: Diagnosis not present

## 2016-07-02 DIAGNOSIS — R001 Bradycardia, unspecified: Secondary | ICD-10-CM

## 2016-07-02 DIAGNOSIS — I10 Essential (primary) hypertension: Secondary | ICD-10-CM | POA: Diagnosis not present

## 2016-07-02 DIAGNOSIS — I2583 Coronary atherosclerosis due to lipid rich plaque: Secondary | ICD-10-CM

## 2016-07-02 NOTE — Progress Notes (Signed)
[address], [address] [place], [place]  [address] Phone: [phone] Fax:  [phone]  Date:  07/02/2016   ID:  Shannon [name], DOB 1951/07/26, MRN [id number]  PCP:  Enrique Sack, MD   History of Present Illness: Shannon [name] is a 65 y.o. female with coronary artery disease, LAD stent in 2011, recent non-STEMI in 2016, January, declined left heart catheterization because of stroke resulting in eye/vision changes with interval bowel syndrome, anxiety, fibromyalgia, diabetes here for follow-up.  Granddaughter of 23 years old died Ewing sarcoma.  At prior visit, she was seen by Ward Givens for bradycardia. Her heart rate was 47. She had trouble increasing heart rate in clinic during walking. She increased it to 64 bpm. Her metoprolol was decreased.  She sees Dr. Anne Hahn for her right posterior cerebral artery stroke as well as bilateral middle cerebral artery atherosclerotic changes. Left visual field defect. Continuing aspirin and Plavix. during periods of emotional stress, she will have decreased vision.   Heart rate is improved. Today she is not tearful.  [place] was recent [place] early August 2017 with remote right PCA territory infarct and numerous small acute infarcts along the left cerebral convexity in a watershed distribution. No hemorrhage. Echocardiogram was normal.  No significant residual defects. She is concerned that she had this stroke while on aspirin and Plavix  Wt Readings from Last 3 Encounters:  11/15/15 160 lb 12.8 oz (72.9 kg)  09/30/15 157 lb 8 oz (71.4 kg)  06/10/15 152 lb (68.9 kg)     Past Medical History:  Diagnosis Date  . Anxiety   . Arthritis   . Coronary artery disease    a. s/p PCI with DES to LAD in 2011  (LHC 03/2010: Mid LAD 70%, normal ejection fraction  >> PCI:  2.75 x 15 mm Promus DES to LAD); b. Nuclear Stress Test 05/29/14: No ischemia, EF 59%, Normal; c. NSTEMI 11/2014 >> patient declined LHC b/c of  recent CVA  . Depression   . Diabetes mellitus without complication (HCC)   . Dyslipidemia 11/20/2014  . Fibromyalgia 1995  . H/O ischemic right PCA stroke 10/2014   a. Carotid US 10/24/14: No significant ICA stenosis bilaterally  . Hemiparesis and alteration of sensations as late effects of stroke (HCC) 06/05/2015  . Hx of echocardiogram    a. Echocardiogram 10/25/14: Moderate LVH, EF 50-55%, normal wall motion, grade 2 diastolic dysfunction, mild LAE, atrial septal lipomatous hypertrophy  . Hypertension   . Irritable bowel     Past Surgical History:  Procedure Laterality Date  . CHOLECYSTECTOMY    . CORONARY STENT PLACEMENT    . orif arm fracture Right    forearm    Current Outpatient Prescriptions  Medication Sig Dispense Refill  . ALPRAZolam (XANAX) 0.5 MG tablet Take 0.5 mg by mouth at bedtime.     Marland Kitchen amLODipine (NORVASC) 5 MG tablet TAKE 1 TABLET (5 MG TOTAL) BY MOUTH DAILY. 30 tablet 11  . Ascorbic Acid (VITAMIN C) 100 MG tablet Take 100 mg by mouth daily.    Marland Kitchen aspirin EC 81 MG tablet Take 1 tablet (81 mg total) by mouth daily.    Marland Kitchen atorvastatin (LIPITOR) 40 MG tablet TAKE 1 TABLET (40 MG TOTAL) BY MOUTH DAILY. 30 tablet 6  . Cholecalciferol (VITAMIN D-3) 1000 UNITS CAPS Take 1 capsule by mouth daily.    . clopidogrel (PLAVIX) 75 MG tablet TAKE 1 TABLET (75 MG  TOTAL) BY MOUTH DAILY WITH BREAKFAST. 30 tablet 6  . FREESTYLE LITE test strip USE 3 TIMES DAILY AS INSTRUCTED 50 each 11  . GARLIC PO Take 1 capsule by mouth daily.    Marland Kitchen. glimepiride (AMARYL) 2 MG tablet Take 1 tablet (2 mg total) by mouth 2 (two) times daily. 60 tablet 3  . glucose blood test strip Check blood sugar before each meal and at bedtime total 4 times per day 100 each 12  . hydrOXYzine (ATARAX/VISTARIL) 10 MG tablet Take 10 mg by mouth 3 (three) times daily as needed for anxiety (take one tablet three times daily as needed for anxiety).    . isosorbide mononitrate (IMDUR) 30 MG 24 hr tablet TAKE 1 TABLET (30  MG TOTAL) BY MOUTH EVERY MORNING. 90 tablet 3  . Lancets (FREESTYLE) lancets Check blood sugar four times per day. Before each meal and at bedtime 100 each 12  . levothyroxine (SYNTHROID, LEVOTHROID) 25 MCG tablet TAKE 1 TABLET (25 MCG TOTAL) BY MOUTH DAILY BEFORE BREAKFAST. 30 tablet 1  . losartan (COZAAR) 25 MG tablet Take 25 mg by mouth 2 (two) times daily.    . methocarbamol (ROBAXIN) 500 MG tablet TAKE 1 TABLET (500 MG TOTAL) BY MOUTH 3 (THREE) TIMES DAILY. 90 tablet 0  . metoprolol tartrate (LOPRESSOR) 25 MG tablet TAKE 1 TABLET (25 MG TOTAL) BY MOUTH 2 (TWO) TIMES DAILY. 180 tablet 1  . Multiple Vitamin (MULTIVITAMIN) capsule Take 1 capsule by mouth daily. Senior vitamin    . nitroGLYCERIN (NITROSTAT) 0.4 MG SL tablet Place 1 tablet (0.4 mg total) under the tongue every 5 (five) minutes as needed. 25 tablet 3  . pantoprazole (PROTONIX) 40 MG tablet Take 1 tablet (40 mg total) by mouth daily. 30 tablet 11  . temazepam (RESTORIL) 7.5 MG capsule Take 7.5 mg by mouth at bedtime.    . vitamin E (VITAMIN E) 400 UNIT capsule Take 400 Units by mouth every morning.     No current facility-administered medications for this visit.     Allergies:    Allergies  Allergen Reactions  . Sulfa Antibiotics Other (See Comments)    Childhood allergy, can't remember  . Lisinopril Cough    Social History:  The patient  reports that she has never smoked. She has never used smokeless tobacco. She reports that she does not drink alcohol or use drugs.   Family History  Problem Relation Age of Onset  . Cancer Mother   . Diabetes Mother   . Hypertension Mother   . Heart failure Mother   . CAD Mother   . Heart attack Mother   . Cancer Father   . Diabetes Father   . Hypertension Father   . Heart failure Father   . CAD Father   . Heart attack Father   . Stroke Father   . CAD Brother   . Diabetes Brother   . Hypertension Brother   . CAD Sister   . Diabetes Sister   . Hypertension Sister   . Heart  attack Sister   . Heart attack Brother   . Stroke Maternal Aunt     ROS:  Please see the history of present illness.   Still having visual difficulties.   All other systems reviewed and negative.   PHYSICAL EXAM: VS:  There were no vitals taken for this visit. Well nourished, well developed, in no acute distress  HEENT: Left eye visual changes Neck: no JVD, normal carotid upstroke, no bruit Cardiac:  normal S1, S2; RRR; no murmur  Lungs:  clear to auscultation bilaterally, no wheezing, rhonchi or rales  Abd: soft, nontender, no hepatomegaly, no bruits  Ext: no edema, 2+ distal pulses Skin: warm and dry  GU: deferred Neuro: no focal abnormalities noted, AAO x 3  EKG:  EKG ordered today 07/02/16 shows sinus rhythm 69 with no other abnormalities. Prior EKG showed sinus bradycardia.     - LHC 03/2010: Mid LAD 70%, normal ejection fraction >> PCI: 2.75 x 15 mm Promus DES to LAD  - Nuclear Stress Test 05/29/14: No ischemia, EF 59%, Normal  - Carotid US 10/24/14: No significant ICA stenosis bilaterally  - Echocardiogram 10/25/14: Moderate LVH, EF 50-55%, normal wall motion, grade 2 diastolic dysfunction, mild LAE, atrial septal lipomatous hypertrophy   ASSESSMENT AND PLAN:  Bradycardia-resolved after decreasing her metoprolol. Doing well.  CVA Recent left-sided strokes. Question possibility of atrial fibrillation. There was no evidence of this on telemetry [facility]. I will refer to electrophysiology for possible LINQ placement. This is a recurrent stroke while she is on aspirin and Plavix.  Prior stroke posterior, left visual field defect-this is her main concern.  She has intermittent visual changes, dim appearance  Coronary artery disease-stable LAD stent, drug-eluting 2011. Continuing with both aspirin and Plavix because of stroke. I would be inclined to refrain from proceeding with heart catheterization, She has been still highly concerned about the  possibility of harm from catheterization, i.e. worsening stroke. I'm fine with holding off at this point in treating aggressive medication management. Remember, prior nuclear stress test showed no ischemia.  Essential hypertension  - Well controlled on current medications.  Diabetes mellitus  - Per Dr. [place]ilton SiGreen.  6 month follow up.  Signed, Donato SchultzMark Skains, MD [facility]  07/02/2016 2:05 PM

## 2016-07-02 NOTE — Patient Instructions (Signed)
Medication Instructions:  The current medical regimen is effective;  continue present plan and medications.  Follow-Up: Follow up in 6 months with Dr. Anne FuSkains.  You will receive a letter in the mail 2 months before you are due.  Please call us when you receive this letter to schedule your follow up appointment.  You may been referred to Electrophysiology to discuss a possible LINQ placement.  If you need a refill on your cardiac medications before your next appointment, please call your pharmacy.  Thank you for choosing [facility]!!

## 2016-07-14 ENCOUNTER — Encounter: Payer: Self-pay | Admitting: Cardiology

## 2016-08-10 ENCOUNTER — Emergency Department (HOSPITAL_BASED_OUTPATIENT_CLINIC_OR_DEPARTMENT_OTHER)
Admission: EM | Admit: 2016-08-10 | Discharge: 2016-08-10 | Disposition: A | Discharge status: Home / Self Care | Payer: Medicare Other | Attending: Emergency Medicine | Admitting: Emergency Medicine

## 2016-08-10 ENCOUNTER — Encounter (HOSPITAL_BASED_OUTPATIENT_CLINIC_OR_DEPARTMENT_OTHER): Payer: Self-pay | Admitting: Respiratory Therapy

## 2016-08-10 DIAGNOSIS — M436 Torticollis: Secondary | ICD-10-CM | POA: Diagnosis not present

## 2016-08-10 DIAGNOSIS — Z79899 Other long term (current) drug therapy: Secondary | ICD-10-CM | POA: Insufficient documentation

## 2016-08-10 DIAGNOSIS — E119 Type 2 diabetes mellitus without complications: Secondary | ICD-10-CM | POA: Diagnosis not present

## 2016-08-10 DIAGNOSIS — Z7982 Long term (current) use of aspirin: Secondary | ICD-10-CM | POA: Insufficient documentation

## 2016-08-10 DIAGNOSIS — I10 Essential (primary) hypertension: Secondary | ICD-10-CM | POA: Insufficient documentation

## 2016-08-10 DIAGNOSIS — R51 Headache: Secondary | ICD-10-CM | POA: Diagnosis not present

## 2016-08-10 DIAGNOSIS — I2510 Atherosclerotic heart disease of native coronary artery without angina pectoris: Secondary | ICD-10-CM | POA: Diagnosis not present

## 2016-08-10 DIAGNOSIS — E039 Hypothyroidism, unspecified: Secondary | ICD-10-CM | POA: Diagnosis not present

## 2016-08-10 DIAGNOSIS — R519 Headache, unspecified: Secondary | ICD-10-CM

## 2016-08-10 LAB — BASIC METABOLIC PANEL
ANION GAP: 8 (ref 5–15)
BUN: 16 mg/dL (ref 6–20)
CO2: 28 mmol/L (ref 22–32)
Calcium: 9.7 mg/dL (ref 8.9–10.3)
Chloride: 99 mmol/L — ABNORMAL LOW (ref 101–111)
Creatinine, Ser: 0.97 mg/dL (ref 0.44–1.00)
GFR, EST NON AFRICAN AMERICAN: 59 mL/min — AB (ref >60)
GLUCOSE: 222 mg/dL — AB (ref 65–99)
POTASSIUM: 4.7 mmol/L (ref 3.5–5.1)
SODIUM: 135 mmol/L (ref 135–145)

## 2016-08-10 LAB — CBC WITH DIFFERENTIAL/PLATELET
BASOS ABS: 0 10*3/uL (ref 0.0–0.1)
BASOS PCT: 0 %
EOS PCT: 1 %
Eosinophils Absolute: 0.1 10*3/uL (ref 0.0–0.7)
HCT: 42 % (ref 36.0–46.0)
Hemoglobin: 14.2 g/dL (ref 12.0–15.0)
Lymphocytes Relative: 13 %
Lymphs Abs: 1.4 10*3/uL (ref 0.7–4.0)
MCH: 28 pg (ref 26.0–34.0)
MCHC: 33.8 g/dL (ref 30.0–36.0)
MCV: 82.8 fL (ref 78.0–100.0)
Monocytes Absolute: 1 10*3/uL (ref 0.1–1.0)
Monocytes Relative: 9 %
NEUTROS ABS: 8.6 10*3/uL — AB (ref 1.7–7.7)
Neutrophils Relative %: 77 %
PLATELETS: 223 10*3/uL (ref 150–400)
RBC: 5.07 MIL/uL (ref 3.87–5.11)
RDW: 13.4 % (ref 11.5–15.5)
WBC: 11.2 10*3/uL — AB (ref 4.0–10.5)

## 2016-08-10 MED ORDER — DICLOFENAC SODIUM 1 % TD GEL: TRANSDERMAL | 0 refills | Status: DC

## 2016-08-10 MED ORDER — METHOCARBAMOL 500 MG PO TABS
750.0000 mg | ORAL_TABLET | Freq: Once | ORAL | Status: AC
  Administered 2016-08-10: 750 mg via ORAL
  Filled 2016-08-10: qty 2

## 2016-08-10 MED ORDER — NAPROXEN 500 MG PO TABS: 500.0000 mg | ORAL_TABLET | Freq: Two times a day (BID) | ORAL | 0 refills | Status: DC

## 2016-08-10 MED ORDER — HYDROCODONE-ACETAMINOPHEN 5-325 MG PO TABS
1.0000 | ORAL_TABLET | Freq: Once | ORAL | Status: AC
  Administered 2016-08-10: 1 via ORAL
  Filled 2016-08-10: qty 1

## 2016-08-10 MED ORDER — METHOCARBAMOL 500 MG PO TABS: 500.0000 mg | ORAL_TABLET | Freq: Four times a day (QID) | ORAL | 0 refills | Status: DC | PRN

## 2016-08-10 NOTE — ED Notes (Signed)
PA at bedside.

## 2016-08-10 NOTE — ED Triage Notes (Signed)
Pt reports pain in neck with radiation to the ears and now has headache. Reports feeling warm yesterday and took aspirin. Has nasal congestion but denies sore throat,  shortness of breath or chest pain.

## 2016-08-10 NOTE — ED Provider Notes (Signed)
[facility] Provider Note   CSN: [id number] Arrival date & time: 08/10/16  1026     History   Chief Complaint Chief Complaint  Patient presents with  . Otalgia  . Headache    HPI Shannon [name] is a 65 y.o. female.  HPI Shannon Peachsther Bonita Caya is a 65 y.o. female with history of arthritis, diabetes, hypertension, CVA, coronary disease, presents to emergency department complaining of headache and neck pain. Patient states symptoms began yesterday, gradually worsening. She reports her head pain is "all over." She reports mild nasal congestion which she attributes to seasonal allergies. She denies sensitivity to light. She denies any nausea or vomiting. She denies any injuries. She states she is also having worsening neck pain. States neck pain is all over. Denies fever or chills. Reports bilateral ear pain. Denies sore throat or cough. She denies any falls or injuries. She denies sleeping on the couch recliner. Denies any new pillows. Denies history of the same. She tried taking her Neurontin which she believes helped some. Denies numbness or weakness in extremities. No chest pain/abdominal pain/ back pain.  Past Medical History:  Diagnosis Date  . Anxiety   . Arthritis   . Coronary artery disease    a. s/p PCI with DES to LAD in 2011  (LHC 03/2010: Mid LAD 70%, normal ejection fraction  >> PCI:  2.75 x 15 mm Promus DES to LAD); b. Nuclear Stress Test 05/29/14: No ischemia, EF 59%, Normal; c. NSTEMI 11/2014 >> patient declined LHC b/c of recent CVA  . Depression   . Diabetes mellitus without complication (HCC)   . Dyslipidemia 11/20/2014  . Fibromyalgia 1995  . H/O ischemic right PCA stroke 10/2014   a. Carotid US 10/24/14: No significant ICA stenosis bilaterally  . Hemiparesis and alteration of sensations as late effects of stroke (HCC) 06/05/2015  . Hx of echocardiogram    a. Echocardiogram 10/25/14: Moderate LVH, EF 50-55%, normal wall motion, grade 2 diastolic  dysfunction, mild LAE, atrial septal lipomatous hypertrophy  . Hypertension   . Irritable bowel     Patient Active Problem List   Diagnosis Date Noted  . Hemiparesis and alteration of sensations as late effects of stroke (HCC) 06/05/2015  . Numbness 12/27/2014  . Visual disturbance 11/26/2014  . Dyslipidemia 11/20/2014  . Hypothyroidism 11/20/2014  . Pain   . Cerebral thrombosis with cerebral infarction (HCC) 11/19/2014  . Weakness   . NSTEMI (non-ST elevated myocardial infarction) (HCC) 11/17/2014  . Acute CVA -Dec 2015 10/24/2014  . DM type 2 (diabetes mellitus, type 2) (HCC) 10/24/2014  . Accelerated hypertension 10/24/2014  . Atypical chest pain 05/16/2014  . CAD S/P LAD PCI July 2011 07/01/2013  . Hypertension, essential 07/01/2013  . Fibromyalgia 07/01/2013  . IBS (irritable bowel syndrome) 07/01/2013    Past Surgical History:  Procedure Laterality Date  . CHOLECYSTECTOMY    . CORONARY STENT PLACEMENT    . orif arm fracture Right    forearm    OB History    Gravida Para Term Preterm AB Living   12 4 4   8      SAB TAB Ectopic Multiple Live Births                   Home Medications    Prior to Admission medications   Medication Sig Start Date End Date Taking? Authorizing Provider  amLODipine (NORVASC) 5 MG tablet TAKE 1 TABLET (5 MG TOTAL) BY MOUTH DAILY. 07/24/15  Yes Loraine LericheMark  Wilber Oliphant, MD  aspirin EC 81 MG tablet Take 1 tablet (81 mg total) by mouth daily. 11/27/14  Yes Scott T Alben Spittle, PA-C  atorvastatin (LIPITOR) 40 MG tablet TAKE 1 TABLET (40 MG TOTAL) BY MOUTH DAILY. 11/29/15  Yes Jake Bathe, MD  Cholecalciferol (VITAMIN D-3) 1000 UNITS CAPS Take 1 capsule by mouth daily.   Yes Historical Provider, MD  clopidogrel (PLAVIX) 75 MG tablet TAKE 1 TABLET (75 MG TOTAL) BY MOUTH DAILY WITH BREAKFAST. 02/20/16  Yes Jake Bathe, MD  GARLIC PO Take 1 capsule by mouth daily.   Yes Historical Provider, MD  glimepiride (AMARYL) 2 MG tablet Take 1 tablet (2 mg total) by  mouth 2 (two) times daily. 07/17/14  Yes Ambrose Finland, NP  hydrOXYzine (ATARAX/VISTARIL) 10 MG tablet Take 10 mg by mouth 3 (three) times daily as needed for anxiety (take one tablet three times daily as needed for anxiety).   Yes Historical Provider, MD  isosorbide mononitrate (IMDUR) 30 MG 24 hr tablet TAKE 1 TABLET (30 MG TOTAL) BY MOUTH EVERY MORNING. 04/14/16  Yes Jake Bathe, MD  levothyroxine (SYNTHROID, LEVOTHROID) 25 MCG tablet TAKE 1 TABLET (25 MCG TOTAL) BY MOUTH DAILY BEFORE BREAKFAST. 07/30/15  Yes Jake Bathe, MD  losartan (COZAAR) 25 MG tablet Take 25 mg by mouth 2 (two) times daily.   Yes Historical Provider, MD  metoprolol tartrate (LOPRESSOR) 25 MG tablet TAKE 1 TABLET (25 MG TOTAL) BY MOUTH 2 (TWO) TIMES DAILY. 02/20/16  Yes Jake Bathe, MD  Multiple Vitamin (MULTIVITAMIN) capsule Take 1 capsule by mouth daily. Senior vitamin   Yes Historical Provider, MD  pantoprazole (PROTONIX) 40 MG tablet Take 1 tablet (40 mg total) by mouth daily. 11/27/14  Yes Scott T Alben Spittle, PA-C  temazepam (RESTORIL) 7.5 MG capsule Take 7.5 mg by mouth at bedtime.   Yes Historical Provider, MD  vitamin E (VITAMIN E) 400 UNIT capsule Take 400 Units by mouth every morning.   Yes Historical Provider, MD  ALPRAZolam Prudy Feeler) 0.5 MG tablet Take 0.5 mg by mouth at bedtime.  03/07/15   Historical Provider, MD  Ascorbic Acid (VITAMIN C) 100 MG tablet Take 100 mg by mouth daily.    Historical Provider, MD  FREESTYLE LITE test strip USE 3 TIMES DAILY AS INSTRUCTED 09/17/15   Ambrose Finland, NP  glucose blood test strip Check blood sugar before each meal and at bedtime total 4 times per day 07/19/14   Ambrose Finland, NP  Lancets (FREESTYLE) lancets Check blood sugar four times per day. Before each meal and at bedtime 07/19/14   Ambrose Finland, NP  nitroGLYCERIN (NITROSTAT) 0.4 MG SL tablet Place 1 tablet (0.4 mg total) under the tongue every 5 (five) minutes as needed. 11/27/14   Beatrice Lecher, PA-C    Family  History Family History  Problem Relation Age of Onset  . Cancer Mother   . Diabetes Mother   . Hypertension Mother   . Heart failure Mother   . CAD Mother   . Heart attack Mother   . Cancer Father   . Diabetes Father   . Hypertension Father   . Heart failure Father   . CAD Father   . Heart attack Father   . Stroke Father   . CAD Brother   . Diabetes Brother   . Hypertension Brother   . CAD Sister   . Diabetes Sister   . Hypertension Sister   . Heart attack Sister   .  Heart attack Brother   . Stroke Maternal Aunt     Social History Social History  Substance Use Topics  . Smoking status: Never Smoker  . Smokeless tobacco: Never Used  . Alcohol use No     Allergies   Sulfa antibiotics and Lisinopril   Review of Systems Review of Systems  Constitutional: Negative for chills and fever.  HENT: Positive for congestion and ear pain. Negative for sore throat and trouble swallowing.   Eyes: Negative for photophobia.  Respiratory: Negative for cough, chest tightness and shortness of breath.   Cardiovascular: Negative for chest pain, palpitations and leg swelling.  Gastrointestinal: Negative for abdominal pain, diarrhea, nausea and vomiting.  Genitourinary: Negative for dysuria, flank pain and pelvic pain.  Musculoskeletal: Positive for arthralgias. Negative for back pain, myalgias, neck pain and neck stiffness.  Skin: Negative for rash.  Neurological: Positive for headaches. Negative for dizziness, syncope, weakness and numbness.  All other systems reviewed and are negative.    Physical Exam Updated Vital Signs BP 155/93 (BP Location: Right Arm)   Pulse 79   Temp 98.1 F (36.7 C) (Oral)   Resp 18   Ht 5\' 4"  (1.626 m)   Wt 69.9 kg   SpO2 100%   BMI 26.43 kg/m   Physical Exam  Constitutional: She is oriented to person, place, and time. She appears well-developed and well-nourished. No distress.  HENT:  Head: Normocephalic.  Right Ear: External ear normal.   Left Ear: External ear normal.  Nose: Nose normal.  Mouth/Throat: Oropharynx is clear and moist.  Poor dentition. TMs and ear canals look normal bilaterally.  Eyes: Conjunctivae and EOM are normal. Pupils are equal, round, and reactive to light.  Neck:  Diffuse tenderness to palpation over posterior paracervical muscles and bilateral trapezius and sternocleidomastoid. Pain with range of motion of the neck.  Cardiovascular: Normal rate, regular rhythm and normal heart sounds.   Pulmonary/Chest: Effort normal and breath sounds normal. No respiratory distress. She has no wheezes. She has no rales.  Abdominal: Soft. Bowel sounds are normal. She exhibits no distension. There is no tenderness. There is no rebound.  Musculoskeletal: She exhibits no edema.  Pain with range of motion of the right shoulder. Distal radial pulses are intact and equal bilaterally.  Neurological: She is alert and oriented to person, place, and time.  5/5 and equal upper and lower extremity strength bilaterally. Equal grip strength bilaterally. Normal finger to nose and heel to shin. No pronator drift.  Skin: Skin is warm and dry.  Psychiatric: She has a normal mood and affect. Her behavior is normal.  Nursing note and vitals reviewed.    ED Treatments / Results  Labs (all labs ordered are listed, but only abnormal results are displayed) Labs Reviewed  CBC WITH DIFFERENTIAL/PLATELET  BASIC METABOLIC PANEL    EKG  EKG Interpretation None       Radiology No results found.  Procedures Procedures (including critical care time)  Medications Ordered in ED Medications  HYDROcodone-acetaminophen (NORCO/VICODIN) 5-325 MG per tablet 1 tablet (not administered)  methocarbamol (ROBAXIN) tablet 750 mg (not administered)     Initial Impression / Assessment and Plan / ED Course  I have reviewed the triage vital signs and the nursing notes.  Pertinent labs & imaging results that were available during my care of  the patient were reviewed by me and considered in my medical decision making (see chart for details).  Clinical Course   11:26 AM Patient seen and examined.  Patient with headache, neck pain and stiffness, gradual onset yesterday. Also complaining of congestion, bilateral ear pain. No fever or chills. Doubt meningitis. Pain is reproducible with palpation over her neck muscles. We'll try muscle relaxant and Norco for pain. Will get basic labs.  12:33 PM Pt states she feels slightly improved. She then admitted taking oxycodone 10mg  last night, that she had left over from prior prescription. Stated "if that didn't help this 5mg  vicodin wont help either." pt also requested soma instead or robaxin. Pt is neurovascularly intact. No fever. Doubt meningitis. Doubt spinal cord injury given no radicular symptoms. Pain reproducible with palpation of neck muscles. Question spasms. Home with naprosyn, robaxin. Follow up with pcp.   Vitals:   08/10/16 1032  BP: 155/93  Pulse: 79  Resp: 18  Temp: 98.1 F (36.7 C)  TempSrc: Oral  SpO2: 100%  Weight: 69.9 kg  Height: 5\' 4"  (1.626 m)     Final Clinical Impressions(s) / ED Diagnoses   Final diagnoses:  Torticollis  Nonintractable headache, unspecified chronicity pattern, unspecified headache type    New Prescriptions Discharge Medication List as of 08/10/2016 12:54 PM    START taking these medications   Details  diclofenac sodium (VOLTAREN) 1 % GEL Apply topically twice a day, Print    methocarbamol (ROBAXIN) 500 MG tablet Take 1 tablet (500 mg total) by mouth every 6 (six) hours as needed for muscle spasms., Starting Mon 08/10/2016, Print    naproxen (NAPROSYN) 500 MG tablet Take 1 tablet (500 mg total) by mouth 2 (two) times daily., Starting Mon 08/10/2016, Print         Jaynie Crumble, PA-C 08/10/16 1420    Gwyneth Sprout, MD 08/10/16 2047

## 2016-08-10 NOTE — ED Notes (Signed)
Daughter of patient Shannon GullyFaith [name] called and notified that patient is ready for discharge. Discharge instructions have been given to the patient and are understood.

## 2016-08-10 NOTE — Discharge Instructions (Signed)
Naprosyn for pain and inflammation. Robaxin for spasms. Apply topical gel for pain as well. Follow up with family doctor for recheck. Return if worsening symptoms, fever, vomiting, any new concerning symptom.

## 2016-08-14 ENCOUNTER — Encounter: Payer: Self-pay | Admitting: Internal Medicine

## 2016-08-14 ENCOUNTER — Encounter: Payer: Self-pay | Admitting: Cardiology

## 2016-09-23 ENCOUNTER — Ambulatory Visit (HOSPITAL_COMMUNITY): Payer: Medicare Other | Admitting: Psychiatry

## 2016-10-02 ENCOUNTER — Emergency Department (HOSPITAL_COMMUNITY): Payer: Medicare Other

## 2016-10-02 ENCOUNTER — Emergency Department (HOSPITAL_COMMUNITY)
Admission: EM | Admit: 2016-10-02 | Discharge: 2016-10-02 | Disposition: A | Discharge status: Home / Self Care | Payer: Medicare Other | Attending: Emergency Medicine | Admitting: Emergency Medicine

## 2016-10-02 ENCOUNTER — Encounter (HOSPITAL_COMMUNITY): Payer: Self-pay | Admitting: Emergency Medicine

## 2016-10-02 DIAGNOSIS — E119 Type 2 diabetes mellitus without complications: Secondary | ICD-10-CM | POA: Insufficient documentation

## 2016-10-02 DIAGNOSIS — Z79899 Other long term (current) drug therapy: Secondary | ICD-10-CM | POA: Diagnosis not present

## 2016-10-02 DIAGNOSIS — I639 Cerebral infarction, unspecified: Secondary | ICD-10-CM | POA: Insufficient documentation

## 2016-10-02 DIAGNOSIS — I6389 Other cerebral infarction: Secondary | ICD-10-CM

## 2016-10-02 DIAGNOSIS — Z7984 Long term (current) use of oral hypoglycemic drugs: Secondary | ICD-10-CM | POA: Insufficient documentation

## 2016-10-02 DIAGNOSIS — I2510 Atherosclerotic heart disease of native coronary artery without angina pectoris: Secondary | ICD-10-CM | POA: Diagnosis not present

## 2016-10-02 DIAGNOSIS — R202 Paresthesia of skin: Secondary | ICD-10-CM | POA: Diagnosis present

## 2016-10-02 DIAGNOSIS — E039 Hypothyroidism, unspecified: Secondary | ICD-10-CM | POA: Insufficient documentation

## 2016-10-02 DIAGNOSIS — I10 Essential (primary) hypertension: Secondary | ICD-10-CM | POA: Insufficient documentation

## 2016-10-02 LAB — CBC
HCT: 40.2 % (ref 36.0–46.0)
Hemoglobin: 13.5 g/dL (ref 12.0–15.0)
MCH: 28.1 pg (ref 26.0–34.0)
MCHC: 33.6 g/dL (ref 30.0–36.0)
MCV: 83.8 fL (ref 78.0–100.0)
PLATELETS: 221 10*3/uL (ref 150–400)
RBC: 4.8 MIL/uL (ref 3.87–5.11)
RDW: 13.5 % (ref 11.5–15.5)
WBC: 11.6 10*3/uL — ABNORMAL HIGH (ref 4.0–10.5)

## 2016-10-02 LAB — I-STAT CHEM 8, ED
BUN: 14 mg/dL (ref 6–20)
CALCIUM ION: 1.09 mmol/L — AB (ref 1.15–1.40)
Chloride: 101 mmol/L (ref 101–111)
Creatinine, Ser: 0.7 mg/dL (ref 0.44–1.00)
Glucose, Bld: 288 mg/dL — ABNORMAL HIGH (ref 65–99)
HCT: 41 % (ref 36.0–46.0)
Hemoglobin: 13.9 g/dL (ref 12.0–15.0)
Potassium: 4 mmol/L (ref 3.5–5.1)
SODIUM: 137 mmol/L (ref 135–145)
TCO2: 26 mmol/L (ref 0–100)

## 2016-10-02 LAB — COMPREHENSIVE METABOLIC PANEL
ALK PHOS: 83 U/L (ref 38–126)
ALT: 28 U/L (ref 14–54)
ANION GAP: 10 (ref 5–15)
AST: 27 U/L (ref 15–41)
Albumin: 4.1 g/dL (ref 3.5–5.0)
BILIRUBIN TOTAL: 0.7 mg/dL (ref 0.3–1.2)
BUN: 12 mg/dL (ref 6–20)
CALCIUM: 9.2 mg/dL (ref 8.9–10.3)
CO2: 24 mmol/L (ref 22–32)
Chloride: 103 mmol/L (ref 101–111)
Creatinine, Ser: 0.94 mg/dL (ref 0.44–1.00)
GFR calc non Af Amer: >60 mL/min (ref >60)
Glucose, Bld: 306 mg/dL — ABNORMAL HIGH (ref 65–99)
Potassium: 4.1 mmol/L (ref 3.5–5.1)
SODIUM: 137 mmol/L (ref 135–145)
TOTAL PROTEIN: 7 g/dL (ref 6.5–8.1)

## 2016-10-02 LAB — DIFFERENTIAL
Basophils Absolute: 0 10*3/uL (ref 0.0–0.1)
Basophils Relative: 0 %
EOS PCT: 3 %
Eosinophils Absolute: 0.3 10*3/uL (ref 0.0–0.7)
LYMPHS ABS: 2.2 10*3/uL (ref 0.7–4.0)
LYMPHS PCT: 19 %
MONO ABS: 0.8 10*3/uL (ref 0.1–1.0)
Monocytes Relative: 7 %
Neutro Abs: 8.3 10*3/uL — ABNORMAL HIGH (ref 1.7–7.7)
Neutrophils Relative %: 71 %

## 2016-10-02 LAB — PROTIME-INR
INR: 0.98
PROTHROMBIN TIME: 13 s (ref 11.4–15.2)

## 2016-10-02 LAB — APTT: aPTT: 26 seconds (ref 24–36)

## 2016-10-02 LAB — I-STAT TROPONIN, ED: TROPONIN I, POC: 0 ng/mL (ref 0.00–0.08)

## 2016-10-02 NOTE — ED Provider Notes (Signed)
  Face-to-face evaluation   History: She presents for evaluation of worsening trouble seeing, left eye, and tingling sensation left side.  Physical exam: Alert, calm, cooperative. No dysarthria or aphasia.   Findings discussed with patient, and all questions answered  Medical screening examination/treatment/procedure(s) were conducted as a shared visit with non-physician practitioner(s) and myself.  I personally evaluated the patient during the encounter   Mancel BaleElliott Omunique Pederson, MD 10/02/16 1601

## 2016-10-02 NOTE — ED Triage Notes (Addendum)
Pt complaint of left foot/leg tingling and stiffness and worsening left vision loss with awakening this morning. Pt all extremities moderate strength, denies numbness throughout, and a/o x4. Pt reports hx of 3 strokes with defects to left eye peripheral vision loss. Pt LSN 2100 last night. Primary RN Dorene GrebeNatalie at bedside with triage.

## 2016-10-02 NOTE — ED Provider Notes (Signed)
[facility]-EMERGENCY DEPT Provider Note   CSN: [id number] Arrival date & time: 10/02/16  1103     History   Chief Complaint Chief Complaint  Patient presents with  . Tingling    HPI Shannon [name] is a 65 y.o. female.  The history is provided by the patient and medical records.    65 year old female with history of anxiety, arthritis, coronary artery disease, depression, diabetes, prior CVA 3 with residual left-sided altered sensation as well as loss of peripheral vision in her left eye, presenting to the ED for worsening vision and tingling in her left leg. Patient states she went to bed last night around 9 PM and awoke this morning around 8am with worsening vision in her left eye as well as some tingling of her left leg.  States her leg does not necessarily feel "weak", but it doesn't feel quite right.  States there is some stiffness as well.  No swelling or redness she has noticed.  no fever or chills.  States she generally has some paresthesias of her left leg at baseline, however feels worse than normal.  States generally she ambulates around with a walker. She's not had any recent falls or trauma. She is on aspirin and Plavix, has been taking as directed. She does follow with a regular outpatient neurologist, Dr. Anne HahnWillis.  Past Medical History:  Diagnosis Date  . Anxiety   . Arthritis   . Coronary artery disease    a. s/p PCI with DES to LAD in 2011  (LHC 03/2010: Mid LAD 70%, normal ejection fraction  >> PCI:  2.75 x 15 mm Promus DES to LAD); b. Nuclear Stress Test 05/29/14: No ischemia, EF 59%, Normal; c. NSTEMI 11/2014 >> patient declined LHC b/c of recent CVA  . Depression   . Diabetes mellitus without complication (HCC)   . Dyslipidemia 11/20/2014  . Fibromyalgia 1995  . H/O ischemic right PCA stroke 10/2014   a. Carotid US 10/24/14: No significant ICA stenosis bilaterally  . Hemiparesis and alteration of sensations as late effects of stroke (HCC) 06/05/2015  . Hx of  echocardiogram    a. Echocardiogram 10/25/14: Moderate LVH, EF 50-55%, normal wall motion, grade 2 diastolic dysfunction, mild LAE, atrial septal lipomatous hypertrophy  . Hypertension   . Irritable bowel     Patient Active Problem List   Diagnosis Date Noted  . Hemiparesis and alteration of sensations as late effects of stroke (HCC) 06/05/2015  . Numbness 12/27/2014  . Visual disturbance 11/26/2014  . Dyslipidemia 11/20/2014  . Hypothyroidism 11/20/2014  . Pain   . Cerebral thrombosis with cerebral infarction (HCC) 11/19/2014  . Weakness   . NSTEMI (non-ST elevated myocardial infarction) (HCC) 11/17/2014  . Acute CVA -Dec 2015 10/24/2014  . DM type 2 (diabetes mellitus, type 2) (HCC) 10/24/2014  . Accelerated hypertension 10/24/2014  . Atypical chest pain 05/16/2014  . CAD S/P LAD PCI July 2011 07/01/2013  . Hypertension, essential 07/01/2013  . Fibromyalgia 07/01/2013  . IBS (irritable bowel syndrome) 07/01/2013    Past Surgical History:  Procedure Laterality Date  . CHOLECYSTECTOMY    . CORONARY STENT PLACEMENT    . orif arm fracture Right    forearm    OB History    Gravida Para Term Preterm AB Living   12 4 4   8      SAB TAB Ectopic Multiple Live Births                   Home Medications  Prior to Admission medications   Medication Sig Start Date End Date Taking? Authorizing Provider  ALPRAZolam Prudy Feeler(XANAX) 0.5 MG tablet Take 0.5 mg by mouth at bedtime.  03/07/15   Historical Provider, MD  amLODipine (NORVASC) 5 MG tablet TAKE 1 TABLET (5 MG TOTAL) BY MOUTH DAILY. 07/24/15   Jake BatheMark C Skains, MD  Ascorbic Acid (VITAMIN C) 100 MG tablet Take 100 mg by mouth daily.    Historical Provider, MD  aspirin EC 81 MG tablet Take 1 tablet (81 mg total) by mouth daily. 11/27/14   Beatrice LecherScott T Weaver, PA-C  atorvastatin (LIPITOR) 40 MG tablet TAKE 1 TABLET (40 MG TOTAL) BY MOUTH DAILY. 11/29/15   Jake BatheMark C Skains, MD  Cholecalciferol (VITAMIN D-3) 1000 UNITS CAPS Take 1 capsule by mouth  daily.    Historical Provider, MD  clopidogrel (PLAVIX) 75 MG tablet TAKE 1 TABLET (75 MG TOTAL) BY MOUTH DAILY WITH BREAKFAST. 02/20/16   Jake BatheMark C Skains, MD  diclofenac sodium (VOLTAREN) 1 % GEL Apply topically twice a day 08/10/16   Jaynie Crumbleatyana Kirichenko, PA-C  FREESTYLE LITE test strip USE 3 TIMES DAILY AS INSTRUCTED 09/17/15   Ambrose FinlandValerie A Keck, NP  GARLIC PO Take 1 capsule by mouth daily.    Historical Provider, MD  glimepiride (AMARYL) 2 MG tablet Take 1 tablet (2 mg total) by mouth 2 (two) times daily. 07/17/14   Ambrose FinlandValerie A Keck, NP  glucose blood test strip Check blood sugar before each meal and at bedtime total 4 times per day 07/19/14   Ambrose FinlandValerie A Keck, NP  hydrOXYzine (ATARAX/VISTARIL) 10 MG tablet Take 10 mg by mouth 3 (three) times daily as needed for anxiety (take one tablet three times daily as needed for anxiety).    Historical Provider, MD  isosorbide mononitrate (IMDUR) 30 MG 24 hr tablet TAKE 1 TABLET (30 MG TOTAL) BY MOUTH EVERY MORNING. 04/14/16   Jake BatheMark C Skains, MD  Lancets (FREESTYLE) lancets Check blood sugar four times per day. Before each meal and at bedtime 07/19/14   Ambrose FinlandValerie A Keck, NP  levothyroxine (SYNTHROID, LEVOTHROID) 25 MCG tablet TAKE 1 TABLET (25 MCG TOTAL) BY MOUTH DAILY BEFORE BREAKFAST. 07/30/15   Jake BatheMark C Skains, MD  losartan (COZAAR) 25 MG tablet Take 25 mg by mouth 2 (two) times daily.    Historical Provider, MD  methocarbamol (ROBAXIN) 500 MG tablet Take 1 tablet (500 mg total) by mouth every 6 (six) hours as needed for muscle spasms. 08/10/16   Tatyana Kirichenko, PA-C  metoprolol tartrate (LOPRESSOR) 25 MG tablet TAKE 1 TABLET (25 MG TOTAL) BY MOUTH 2 (TWO) TIMES DAILY. 02/20/16   Jake BatheMark C Skains, MD  Multiple Vitamin (MULTIVITAMIN) capsule Take 1 capsule by mouth daily. Senior vitamin    Historical Provider, MD  naproxen (NAPROSYN) 500 MG tablet Take 1 tablet (500 mg total) by mouth 2 (two) times daily. 08/10/16   Tatyana Kirichenko, PA-C  nitroGLYCERIN (NITROSTAT) 0.4 MG SL  tablet Place 1 tablet (0.4 mg total) under the tongue every 5 (five) minutes as needed. 11/27/14   Beatrice LecherScott T Weaver, PA-C  pantoprazole (PROTONIX) 40 MG tablet Take 1 tablet (40 mg total) by mouth daily. 11/27/14   Beatrice LecherScott T Weaver, PA-C  temazepam (RESTORIL) 7.5 MG capsule Take 7.5 mg by mouth at bedtime.    Historical Provider, MD  vitamin E (VITAMIN E) 400 UNIT capsule Take 400 Units by mouth every morning.    Historical Provider, MD    Family History Family History  Problem Relation Age of  Onset  . Cancer Mother   . Diabetes Mother   . Hypertension Mother   . Heart failure Mother   . CAD Mother   . Heart attack Mother   . Cancer Father   . Diabetes Father   . Hypertension Father   . Heart failure Father   . CAD Father   . Heart attack Father   . Stroke Father   . CAD Brother   . Diabetes Brother   . Hypertension Brother   . CAD Sister   . Diabetes Sister   . Hypertension Sister   . Heart attack Sister   . Heart attack Brother   . Stroke Maternal Aunt     Social History Social History  Substance Use Topics  . Smoking status: Never Smoker  . Smokeless tobacco: Never Used  . Alcohol use No     Allergies   Sulfa antibiotics and Lisinopril   Review of Systems Review of Systems  Eyes: Positive for visual disturbance.  Neurological:       Paresthesias  All other systems reviewed and are negative.    Physical Exam Updated Vital Signs BP 150/77 (BP Location: Left Arm)   Pulse 88   Temp 97.7 F (36.5 C) (Oral)   Resp 16   Ht 5\' 4"  (1.626 m)   Wt 69.9 kg   SpO2 99%   BMI 26.43 kg/m   Physical Exam  Constitutional: She is oriented to person, place, and time. She appears well-developed and well-nourished.  HENT:  Head: Normocephalic and atraumatic.  Mouth/Throat: Oropharynx is clear and moist.  Eyes: Conjunctivae and EOM are normal. Pupils are equal, round, and reactive to light.  Loss of peripheral vision in left eye, right eye appears normal  Neck: Normal  range of motion.  Cardiovascular: Normal rate, regular rhythm and normal heart sounds.   Pulmonary/Chest: Effort normal and breath sounds normal.  Abdominal: Soft. Bowel sounds are normal.  Musculoskeletal: Normal range of motion.  Neurological: She is alert and oriented to person, place, and time.  AAOx3, answering questions and following commands appropriately; equal strength and sensation UE and LE bilaterally; CN grossly intact; moves all extremities appropriately without ataxia; no pronator drift; no facial droop, normal speech  Skin: Skin is warm and dry.  Psychiatric: She has a normal mood and affect.  Nursing note and vitals reviewed.    ED Treatments / Results  Labs (all labs ordered are listed, but only abnormal results are displayed) Labs Reviewed  CBC - Abnormal; Notable for the following:       Result Value   WBC 11.6 (*)    All other components within normal limits  DIFFERENTIAL - Abnormal; Notable for the following:    Neutro Abs 8.3 (*)    All other components within normal limits  I-STAT CHEM 8, ED - Abnormal; Notable for the following:    Glucose, Bld 288 (*)    Calcium, Ion 1.09 (*)    All other components within normal limits  PROTIME-INR  APTT  COMPREHENSIVE METABOLIC PANEL  I-STAT TROPOININ, ED  CBG MONITORING, ED    EKG  EKG Interpretation None       Radiology Ct Head Wo Contrast  Result Date: 10/02/2016 CLINICAL DATA:  Dizziness. Prior strokes. Left foot and right arm tingling. EXAM: CT HEAD WITHOUT CONTRAST TECHNIQUE: Contiguous axial images were obtained from the base of the skull through the vertex without intravenous contrast. COMPARISON:  06/16/2016 FINDINGS: Brain: Stable appearance of chronic encephalomalacia from  right PCA distribution stroke. 5 mm lacunar infarct in the left corona radiata, image 19/2, corresponding to the location of one of the prior infarcts shown on prior MRI brain from 06/15/2016, accordingly felt to be chronic.  Remote 6 cm lacunar infarct in the genu of the right corpus callosum. Vascular: There is atherosclerotic calcification of the cavernous carotid arteries bilaterally. Skull: Unremarkable Sinuses/Orbits: Mild chronic sphenoid and ethmoid sinusitis. Other: No supplemental non-categorized findings. IMPRESSION: 1. Old right PCA distribution infarct and old lacunar infarcts in the genu of the right corpus callosum and in the left corona radiata. No acute intracranial findings. 2. Mild chronic sphenoid and ethmoid sinusitis. Electronically Signed   By: Gaylyn Rong M.D.   On: 10/02/2016 12:24   Mr Brain Wo Contrast  Result Date: 10/02/2016 CLINICAL DATA:  65 year old female with left lower extremity tingling, stiffness, and worsening left vision upon waking today. Multiple prior cerebral infarcts. Initial encounter. EXAM: MRI HEAD WITHOUT CONTRAST TECHNIQUE: Multiplanar, multiecho pulse sequences of the brain and surrounding structures were obtained without intravenous contrast. COMPARISON:  CT head without contrast 1212 hours today. High [facility] 06/26/1979 CTA head and neck. High [facility] brain MRI 06/15/2016, and earlier. FINDINGS: Brain: Recurrent multiple small foci of restricted diffusion scattered in the left hemisphere in a left MCA watershed distribution. Today the central and posterior left MCA region primarily is affected. No contralateral right hemisphere or posterior fossa restricted diffusion. Mild associated T2 and FLAIR hyperintensity in the acute areas. No associated acute intracranial hemorrhage or mass effect. Expected evolution of the small left hemisphere watershed infarcts seen in August. Superimposed chronic right PCA territory infarcts. Chronic left MCA watershed lacunar type infarcts. Chronic right genu corpus callosum lacunar infarct. Stable cerebral volume. No midline shift, mass effect, evidence of mass lesion, ventriculomegaly, extra-axial collection or  acute intracranial hemorrhage. Cervicomedullary junction and pituitary are within normal limits. Vascular: Major intracranial vascular flow voids are stable since August. Skull and upper cervical spine: Negative. Normal bone marrow signal. Sinuses/Orbits: Negative orbits soft tissues. Stable mild to moderate paranasal sinus mucosal thickening. Trace right mastoid fluid is stable. Other: Visible internal auditory structures appear normal. Negative scalp soft tissues. IMPRESSION: 1. Mild recurrent acute watershed territory infarcts in the left hemisphere, similar to those that occur on 06/15/2016. No associated hemorrhage or mass effect. 2. Expected evolution of the small infarcts that occurred in August. Chronic ischemia also in the right PCA and right ACA territories. Electronically Signed   By: Odessa Fleming M.D.   On: 10/02/2016 14:51    Procedures Procedures (including critical care time)  Medications Ordered in ED Medications - No data to display   Initial Impression / Assessment and Plan / ED Course  I have reviewed the triage vital signs and the nursing notes.  Pertinent labs & imaging results that were available during my care of the patient were reviewed by me and considered in my medical decision making (see chart for details).  Clinical Course    65 year old female here with change in vision her left eye and tingling in her left leg. History of prior strokes with residual left-sided peripheral vision loss and sensation and proprioception deficits of her left leg. States this feels worse than baseline. She was last seen well at 9 PM, woke up this morning with deficits around 8am. She is out of the stroke window, therefore code stroke not called. Labwork is reassuring. CT scan without any acute findings. Given her history of multiple strokes,  will obtain MRI.  Patient's MRI does reveal mild recurrent acute watershed territory infarcts on left.  Discussed with neurology, Dr. Benedict Needy-- since patient  has already had full stroke work-up this year including CTA head/neck, TEE, carotid dopplers, etc. And is already on maximum therapy with ASA and plavix with statin, there is not much more to do from an inpatient work-up standpoint.  Recommends hypercoagulable panel for her outpatient neurologist to follow-up on, can increase ASA to 162mg  daily.  Results and recommendations discussed with patient, she acknowledged understanding.  She remains ambulatory with cane and is able to care for herself which is baseline.  She feels comfortable going home.  Hypercoagulable panel will be sent.  She will follow-up with her neurologist.  Patient was given strict return precautions for any new or worsening symptoms.  Case discussed with attending physician, Dr. Effie Shy, who evaluated patient and agrees with assessment and plan of care.  Final Clinical Impressions(s) / ED Diagnoses   Final diagnoses:  Acute unilateral cerebral infarction in a watershed distribution [facility])    New Prescriptions New Prescriptions   No medications on file     Garlon Hatchet, Cordelia Poche 10/02/16 1543    Mancel Bale, MD 10/02/16 1601

## 2016-10-02 NOTE — Discharge Instructions (Signed)
As we discussed, your MRI today did reveal a small recurrent stroke. As per neurology, since you had recent workup in the past few months, you did not necessarily need to be readmitted if you are able to care for yourself. We recommend to increase your Aspirin dose to 162mg  daily (2 tablets of your current dose). Follow-up with Dr. Anne HahnWillis. Return here for any new or worsening symptoms including weakness of her arms or legs, dizziness, confusion, changes in speech, fever, chest pain, or shortness of breath.

## 2016-10-02 NOTE — ED Notes (Addendum)
Provider not available. Verbal from [place] PA to order stroke protocol without activating CODE STROKE.

## 2016-10-03 LAB — PROTEIN S ACTIVITY: PROTEIN S ACTIVITY: 84 % (ref 63–140)

## 2016-10-03 LAB — LUPUS ANTICOAGULANT PANEL
DRVVT: 36.1 s (ref 0.0–47.0)
PTT Lupus Anticoagulant: 29.8 s (ref 0.0–51.9)

## 2016-10-03 LAB — PROTEIN C ACTIVITY: Protein C Activity: 129 % (ref 73–180)

## 2016-10-03 LAB — PROTEIN C, TOTAL: Protein C, Total: 77 % (ref 60–150)

## 2016-10-03 LAB — BETA-2-GLYCOPROTEIN I ABS, IGG/M/A
BETA-2-GLYCOPROTEIN I IGA: 12 GPI IgA units (ref 0–25)
Beta-2-Glycoprotein I IgM: <9 GPI IgM units (ref 0–32)

## 2016-10-03 LAB — PROTEIN S, TOTAL: Protein S Ag, Total: 87 % (ref 60–150)

## 2016-10-03 LAB — ANTITHROMBIN III: AntiThromb III Func: 94 % (ref 75–120)

## 2016-10-05 LAB — CARDIOLIPIN ANTIBODIES, IGG, IGM, IGA: Anticardiolipin IgA: <9 APL U/mL (ref 0–11)

## 2016-10-05 LAB — HOMOCYSTEINE: Homocysteine: 13.8 umol/L (ref 0.0–15.0)

## 2016-10-06 ENCOUNTER — Encounter (HOSPITAL_COMMUNITY): Payer: Self-pay | Admitting: Emergency Medicine

## 2016-10-06 ENCOUNTER — Emergency Department (HOSPITAL_COMMUNITY)
Admission: EM | Admit: 2016-10-06 | Discharge: 2016-10-07 | Disposition: A | Discharge status: Home / Self Care | Payer: Medicare Other | Attending: Emergency Medicine | Admitting: Emergency Medicine

## 2016-10-06 ENCOUNTER — Emergency Department (HOSPITAL_COMMUNITY): Payer: Medicare Other

## 2016-10-06 DIAGNOSIS — I252 Old myocardial infarction: Secondary | ICD-10-CM | POA: Diagnosis not present

## 2016-10-06 DIAGNOSIS — H538 Other visual disturbances: Secondary | ICD-10-CM | POA: Diagnosis not present

## 2016-10-06 DIAGNOSIS — Z7982 Long term (current) use of aspirin: Secondary | ICD-10-CM | POA: Insufficient documentation

## 2016-10-06 DIAGNOSIS — E119 Type 2 diabetes mellitus without complications: Secondary | ICD-10-CM | POA: Diagnosis not present

## 2016-10-06 DIAGNOSIS — E039 Hypothyroidism, unspecified: Secondary | ICD-10-CM | POA: Diagnosis not present

## 2016-10-06 DIAGNOSIS — Z8673 Personal history of transient ischemic attack (TIA), and cerebral infarction without residual deficits: Secondary | ICD-10-CM | POA: Insufficient documentation

## 2016-10-06 DIAGNOSIS — Z7984 Long term (current) use of oral hypoglycemic drugs: Secondary | ICD-10-CM | POA: Diagnosis not present

## 2016-10-06 DIAGNOSIS — Z79899 Other long term (current) drug therapy: Secondary | ICD-10-CM | POA: Insufficient documentation

## 2016-10-06 DIAGNOSIS — Z955 Presence of coronary angioplasty implant and graft: Secondary | ICD-10-CM | POA: Diagnosis not present

## 2016-10-06 DIAGNOSIS — I10 Essential (primary) hypertension: Secondary | ICD-10-CM | POA: Diagnosis not present

## 2016-10-06 DIAGNOSIS — R791 Abnormal coagulation profile: Secondary | ICD-10-CM | POA: Diagnosis not present

## 2016-10-06 DIAGNOSIS — I2510 Atherosclerotic heart disease of native coronary artery without angina pectoris: Secondary | ICD-10-CM | POA: Diagnosis not present

## 2016-10-06 LAB — URINALYSIS, ROUTINE W REFLEX MICROSCOPIC
Bilirubin Urine: NEGATIVE
GLUCOSE, UA: NEGATIVE mg/dL
Hgb urine dipstick: NEGATIVE
Ketones, ur: NEGATIVE mg/dL
Nitrite: NEGATIVE
PH: 6 (ref 5.0–8.0)
Protein, ur: NEGATIVE mg/dL
Specific Gravity, Urine: 1.006 (ref 1.005–1.030)

## 2016-10-06 LAB — COMPREHENSIVE METABOLIC PANEL
ALBUMIN: 4.5 g/dL (ref 3.5–5.0)
ALT: 27 U/L (ref 14–54)
ANION GAP: 11 (ref 5–15)
AST: 31 U/L (ref 15–41)
Alkaline Phosphatase: 85 U/L (ref 38–126)
BILIRUBIN TOTAL: 1.2 mg/dL (ref 0.3–1.2)
BUN: 14 mg/dL (ref 6–20)
CO2: 22 mmol/L (ref 22–32)
Calcium: 9.2 mg/dL (ref 8.9–10.3)
Chloride: 99 mmol/L — ABNORMAL LOW (ref 101–111)
Creatinine, Ser: 0.82 mg/dL (ref 0.44–1.00)
GFR calc Af Amer: >60 mL/min (ref >60)
GLUCOSE: 198 mg/dL — AB (ref 65–99)
POTASSIUM: 4.8 mmol/L (ref 3.5–5.1)
Sodium: 132 mmol/L — ABNORMAL LOW (ref 135–145)
TOTAL PROTEIN: 7.6 g/dL (ref 6.5–8.1)

## 2016-10-06 LAB — I-STAT CHEM 8, ED
BUN: 17 mg/dL (ref 6–20)
Calcium, Ion: 1.01 mmol/L — ABNORMAL LOW (ref 1.15–1.40)
Chloride: 99 mmol/L — ABNORMAL LOW (ref 101–111)
Creatinine, Ser: 0.8 mg/dL (ref 0.44–1.00)
Glucose, Bld: 199 mg/dL — ABNORMAL HIGH (ref 65–99)
HEMATOCRIT: 45 % (ref 36.0–46.0)
HEMOGLOBIN: 15.3 g/dL — AB (ref 12.0–15.0)
POTASSIUM: 4.8 mmol/L (ref 3.5–5.1)
SODIUM: 133 mmol/L — AB (ref 135–145)
TCO2: 25 mmol/L (ref 0–100)

## 2016-10-06 LAB — URINE MICROSCOPIC-ADD ON: RBC / HPF: NONE SEEN RBC/hpf (ref 0–5)

## 2016-10-06 LAB — RAPID URINE DRUG SCREEN, HOSP PERFORMED
Amphetamines: NOT DETECTED
Barbiturates: NOT DETECTED
Benzodiazepines: POSITIVE — AB
COCAINE: NOT DETECTED
OPIATES: NOT DETECTED
Tetrahydrocannabinol: NOT DETECTED

## 2016-10-06 LAB — I-STAT TROPONIN, ED: TROPONIN I, POC: 0 ng/mL (ref 0.00–0.08)

## 2016-10-06 LAB — DIFFERENTIAL
BASOS PCT: 0 %
Basophils Absolute: 0 10*3/uL (ref 0.0–0.1)
EOS ABS: 0.1 10*3/uL (ref 0.0–0.7)
EOS PCT: 1 %
LYMPHS ABS: 1.9 10*3/uL (ref 0.7–4.0)
Lymphocytes Relative: 14 %
Monocytes Absolute: 0.6 10*3/uL (ref 0.1–1.0)
Monocytes Relative: 4 %
NEUTROS PCT: 81 %
Neutro Abs: 10.6 10*3/uL — ABNORMAL HIGH (ref 1.7–7.7)

## 2016-10-06 LAB — CBC
HCT: 42.8 % (ref 36.0–46.0)
HEMOGLOBIN: 14.8 g/dL (ref 12.0–15.0)
MCH: 28.4 pg (ref 26.0–34.0)
MCHC: 34.6 g/dL (ref 30.0–36.0)
MCV: 82 fL (ref 78.0–100.0)
PLATELETS: 247 10*3/uL (ref 150–400)
RBC: 5.22 MIL/uL — ABNORMAL HIGH (ref 3.87–5.11)
RDW: 13.3 % (ref 11.5–15.5)
WBC: 13.1 10*3/uL — ABNORMAL HIGH (ref 4.0–10.5)

## 2016-10-06 LAB — ANTITHROMBIN PANEL
ANTITHROMB III FUNC: 83 % (ref 75–135)
AT III AG PPP IMM-ACNC: 81 % (ref 72–124)

## 2016-10-06 LAB — PROTIME-INR
INR: 1.07
PROTHROMBIN TIME: 13.9 s (ref 11.4–15.2)

## 2016-10-06 LAB — APTT: aPTT: 27 seconds (ref 24–36)

## 2016-10-06 LAB — ETHANOL

## 2016-10-06 MED ORDER — ONDANSETRON HCL 4 MG/2ML IJ SOLN
4.0000 mg | Freq: Once | INTRAMUSCULAR | Status: AC
  Administered 2016-10-06: 4 mg via INTRAVENOUS
  Filled 2016-10-06: qty 2

## 2016-10-06 MED ORDER — HYDROMORPHONE HCL 1 MG/ML IJ SOLN
0.5000 mg | Freq: Once | INTRAMUSCULAR | Status: AC
  Administered 2016-10-06: 0.5 mg via INTRAVENOUS
  Filled 2016-10-06: qty 1

## 2016-10-06 NOTE — ED Triage Notes (Signed)
[facility] with c/o blurred vision.  Pt reports that she has been having blurry vision to both eyes since 5 days ago but it has gotten "worse" tonight.  Pt reports that she was recently diagnosed having a "stroke" in August; further reports that she was seen here last Friday for blurred vision and "unsteady gait".

## 2016-10-06 NOTE — ED Notes (Signed)
Bed: WA11 Expected date:  Expected time:  Means of arrival:  Comments: For hall B 

## 2016-10-06 NOTE — ED Notes (Signed)
Pt still in MRI 

## 2016-10-06 NOTE — ED Notes (Signed)
Informed patient of the need for urine.  Patient advised will call out when needs to go.

## 2016-10-07 ENCOUNTER — Telehealth: Payer: Self-pay

## 2016-10-07 LAB — FACTOR 5 LEIDEN

## 2016-10-07 LAB — PROTHROMBIN GENE MUTATION

## 2016-10-07 MED ORDER — DIPHENHYDRAMINE HCL 25 MG PO CAPS
25.0000 mg | ORAL_CAPSULE | Freq: Once | ORAL | Status: AC
Start: 2016-10-07 — End: 2016-10-07
  Administered 2016-10-07: 25 mg via ORAL
  Filled 2016-10-07: qty 1

## 2016-10-07 NOTE — Discharge Instructions (Signed)
Follow-up with your neurologist. Today's MRI shows no evidence of any acute changes compared to the MRI on November 24. Return for any new or worse symptoms.

## 2016-10-07 NOTE — ED Provider Notes (Signed)
[facility]-EMERGENCY DEPT Provider Note   CSN: [id number] Arrival date & time: 10/06/16  1948     History   Chief Complaint Chief Complaint  Patient presents with  . Blurred Vision    HPI Shannon [name] is a 65 y.o. female.  Patient presenting tonight with concerns for worse vision that may be secondary to a stroke. Patient's had worsening blurry vision in both eyes as since 5 days ago. Patient was seen November 11 for potential stroke with similar symptoms. MRI of the brain at that time did show some subtle changes in the area of her brain from previous stroke in August. Discussed with neural hospitalist at that time and since patient's had full workup and deficits were minimal and is on appropriate therapy admission was not recommended. Patient is concerned that things are getting worse. Patient also had left foot and leg tingling and stiffness at that time. She's had some of that persisting but it has not gotten worse. Patient is followed by [facility] neurology.      Past Medical History:  Diagnosis Date  . Anxiety   . Arthritis   . Coronary artery disease    a. s/p PCI with DES to LAD in 2011  (LHC 03/2010: Mid LAD 70%, normal ejection fraction  >> PCI:  2.75 x 15 mm Promus DES to LAD); b. Nuclear Stress Test 05/29/14: No ischemia, EF 59%, Normal; c. NSTEMI 11/2014 >> patient declined LHC b/c of recent CVA  . Depression   . Diabetes mellitus without complication (HCC)   . Dyslipidemia 11/20/2014  . Fibromyalgia 1995  . H/O ischemic right PCA stroke 10/2014   a. Carotid US 10/24/14: No significant ICA stenosis bilaterally  . Hemiparesis and alteration of sensations as late effects of stroke (HCC) 06/05/2015  . Hx of echocardiogram    a. Echocardiogram 10/25/14: Moderate LVH, EF 50-55%, normal wall motion, grade 2 diastolic dysfunction, mild LAE, atrial septal lipomatous hypertrophy  . Hypertension   . Irritable bowel     Patient Active Problem List   Diagnosis Date Noted  .  Hemiparesis and alteration of sensations as late effects of stroke (HCC) 06/05/2015  . Numbness 12/27/2014  . Visual disturbance 11/26/2014  . Dyslipidemia 11/20/2014  . Hypothyroidism 11/20/2014  . Pain   . Cerebral thrombosis with cerebral infarction (HCC) 11/19/2014  . Weakness   . NSTEMI (non-ST elevated myocardial infarction) (HCC) 11/17/2014  . Acute CVA -Dec 2015 10/24/2014  . DM type 2 (diabetes mellitus, type 2) (HCC) 10/24/2014  . Accelerated hypertension 10/24/2014  . Atypical chest pain 05/16/2014  . CAD S/P LAD PCI July 2011 07/01/2013  . Hypertension, essential 07/01/2013  . Fibromyalgia 07/01/2013  . IBS (irritable bowel syndrome) 07/01/2013    Past Surgical History:  Procedure Laterality Date  . CHOLECYSTECTOMY    . CORONARY STENT PLACEMENT    . orif arm fracture Right    forearm    OB History    Gravida Para Term Preterm AB Living   12 4 4   8      SAB TAB Ectopic Multiple Live Births                   Home Medications    Prior to Admission medications   Medication Sig Start Date End Date Taking? Authorizing Provider  ALPRAZolam Prudy Feeler) 0.5 MG tablet Take 0.5 mg by mouth at bedtime.  03/07/15   Historical Provider, MD  amLODipine (NORVASC) 10 MG tablet Take 10 mg by mouth daily.  09/11/16   Historical Provider, MD  Ascorbic Acid (VITAMIN C) 100 MG tablet Take 100 mg by mouth daily.    Historical Provider, MD  aspirin EC 81 MG tablet Take 1 tablet (81 mg total) by mouth daily. 11/27/14   Beatrice Lecher, PA-C  atorvastatin (LIPITOR) 40 MG tablet TAKE 1 TABLET (40 MG TOTAL) BY MOUTH DAILY. 11/29/15   Jake Bathe, MD  Cholecalciferol (VITAMIN D-3) 1000 UNITS CAPS Take 1 capsule by mouth daily.    Historical Provider, MD  clopidogrel (PLAVIX) 75 MG tablet TAKE 1 TABLET (75 MG TOTAL) BY MOUTH DAILY WITH BREAKFAST. 02/20/16   Jake Bathe, MD  diclofenac sodium (VOLTAREN) 1 % GEL Apply topically twice a day Patient not taking: Reported on 10/02/2016 08/10/16    Jaynie Crumble, PA-C  FREESTYLE LITE test strip USE 3 TIMES DAILY AS INSTRUCTED 09/17/15   Ambrose Finland, NP  gabapentin (NEURONTIN) 400 MG capsule Take 400 mg by mouth 2 (two) times daily. 07/29/16   Historical Provider, MD  GARLIC PO Take 1 capsule by mouth daily.    Historical Provider, MD  glimepiride (AMARYL) 2 MG tablet Take 1 tablet (2 mg total) by mouth 2 (two) times daily. Patient not taking: Reported on 10/02/2016 07/17/14   Ambrose Finland, NP  glimepiride (AMARYL) 4 MG tablet Take 4 mg by mouth 2 (two) times daily. 09/03/16   Historical Provider, MD  glucose blood test strip Check blood sugar before each meal and at bedtime total 4 times per day 07/19/14   Ambrose Finland, NP  hydrOXYzine (ATARAX/VISTARIL) 10 MG tablet Take 10 mg by mouth 3 (three) times daily as needed for anxiety (take one tablet three times daily as needed for anxiety).    Historical Provider, MD  isosorbide mononitrate (IMDUR) 30 MG 24 hr tablet TAKE 1 TABLET (30 MG TOTAL) BY MOUTH EVERY MORNING. Patient taking differently: Take 30 mg by mouth at bedtime.  04/14/16   Jake Bathe, MD  JANUVIA 100 MG tablet Take 100 mg by mouth daily. 09/11/16   Historical Provider, MD  Lancets (FREESTYLE) lancets Check blood sugar four times per day. Before each meal and at bedtime 07/19/14   Ambrose Finland, NP  levothyroxine (SYNTHROID, LEVOTHROID) 25 MCG tablet TAKE 1 TABLET (25 MCG TOTAL) BY MOUTH DAILY BEFORE BREAKFAST. 07/30/15   Jake Bathe, MD  losartan (COZAAR) 100 MG tablet Take 100 mg by mouth daily. 07/29/16   Historical Provider, MD  methocarbamol (ROBAXIN) 500 MG tablet Take 1 tablet (500 mg total) by mouth every 6 (six) hours as needed for muscle spasms. Patient not taking: Reported on 10/02/2016 08/10/16   Tatyana Kirichenko, PA-C  metoprolol tartrate (LOPRESSOR) 25 MG tablet TAKE 1 TABLET (25 MG TOTAL) BY MOUTH 2 (TWO) TIMES DAILY. 02/20/16   Jake Bathe, MD  metoprolol tartrate (LOPRESSOR) 25 MG tablet Take 25 mg by  mouth daily. 06/29/16   Historical Provider, MD  Multiple Vitamin (MULTIVITAMIN) capsule Take 1 capsule by mouth daily. Senior vitamin    Historical Provider, MD  naproxen (NAPROSYN) 500 MG tablet Take 1 tablet (500 mg total) by mouth 2 (two) times daily. 08/10/16   Tatyana Kirichenko, PA-C  nitroGLYCERIN (NITROSTAT) 0.4 MG SL tablet Place 1 tablet (0.4 mg total) under the tongue every 5 (five) minutes as needed. 11/27/14   Beatrice Lecher, PA-C  pantoprazole (PROTONIX) 40 MG tablet Take 1 tablet (40 mg total) by mouth daily. Patient not taking: Reported on 10/02/2016  11/27/14   Beatrice Lecher, PA-C  temazepam (RESTORIL) 7.5 MG capsule Take 7.5 mg by mouth at bedtime.    Historical Provider, MD  vitamin E (VITAMIN E) 400 UNIT capsule Take 400 Units by mouth every morning.    Historical Provider, MD    Family History Family History  Problem Relation Age of Onset  . Cancer Mother   . Diabetes Mother   . Hypertension Mother   . Heart failure Mother   . CAD Mother   . Heart attack Mother   . Cancer Father   . Diabetes Father   . Hypertension Father   . Heart failure Father   . CAD Father   . Heart attack Father   . Stroke Father   . CAD Brother   . Diabetes Brother   . Hypertension Brother   . CAD Sister   . Diabetes Sister   . Hypertension Sister   . Heart attack Sister   . Heart attack Brother   . Stroke Maternal Aunt     Social History Social History  Substance Use Topics  . Smoking status: Never Smoker  . Smokeless tobacco: Never Used  . Alcohol use No     Allergies   Sulfa antibiotics and Lisinopril   Review of Systems Review of Systems  Constitutional: Negative for fever.  HENT: Negative for congestion.   Eyes: Positive for visual disturbance.  Respiratory: Negative for shortness of breath.   Cardiovascular: Negative for chest pain.  Gastrointestinal: Negative for abdominal pain.  Genitourinary: Negative for dysuria.  Musculoskeletal: Negative for back pain.    Neurological: Positive for weakness and numbness. Negative for headaches.  Hematological: Does not bruise/bleed easily.  Psychiatric/Behavioral: Negative for confusion.     Physical Exam Updated Vital Signs BP 141/93   Pulse 78   Temp 98.1 F (36.7 C) (Oral)   Resp 18   Ht 5\' 4"  (1.626 m)   Wt 69.9 kg   SpO2 100%   BMI 26.43 kg/m   Physical Exam  Constitutional: She appears well-developed and well-nourished. No distress.  HENT:  Head: Normocephalic and atraumatic.  Mouth/Throat: Oropharynx is clear and moist.  Eyes: Conjunctivae and EOM are normal. Pupils are equal, round, and reactive to light.  Neck: Normal range of motion. Neck supple.  Cardiovascular: Normal rate, regular rhythm and normal heart sounds.   Pulmonary/Chest: Effort normal and breath sounds normal. No respiratory distress.  Abdominal: Soft. Bowel sounds are normal.  Musculoskeletal: Normal range of motion.  Neurological: She is alert. A cranial nerve deficit is present. No sensory deficit. She exhibits normal muscle tone. Coordination normal.  Skin: Skin is warm.  Nursing note and vitals reviewed.    ED Treatments / Results  Labs (all labs ordered are listed, but only abnormal results are displayed) Labs Reviewed  CBC - Abnormal; Notable for the following:       Result Value   WBC 13.1 (*)    RBC 5.22 (*)    All other components within normal limits  DIFFERENTIAL - Abnormal; Notable for the following:    Neutro Abs 10.6 (*)    All other components within normal limits  COMPREHENSIVE METABOLIC PANEL - Abnormal; Notable for the following:    Sodium 132 (*)    Chloride 99 (*)    Glucose, Bld 198 (*)    All other components within normal limits  RAPID URINE DRUG SCREEN, HOSP PERFORMED - Abnormal; Notable for the following:    Benzodiazepines POSITIVE (*)  All other components within normal limits  URINALYSIS, ROUTINE W REFLEX MICROSCOPIC (NOT AT [facility]) - Abnormal; Notable for the following:     Leukocytes, UA MODERATE (*)    All other components within normal limits  URINE MICROSCOPIC-ADD ON - Abnormal; Notable for the following:    Squamous Epithelial / LPF 0-5 (*)    Bacteria, UA RARE (*)    All other components within normal limits  I-STAT CHEM 8, ED - Abnormal; Notable for the following:    Sodium 133 (*)    Chloride 99 (*)    Glucose, Bld 199 (*)    Calcium, Ion 1.01 (*)    Hemoglobin 15.3 (*)    All other components within normal limits  ETHANOL  PROTIME-INR  APTT  I-STAT TROPOININ, ED    EKG  EKG Interpretation  Date/Time:  Tuesday October 06 2016 20:03:14 EST Ventricular Rate:  73 PR Interval:  132 QRS Duration: 86 QT Interval:  422 QTC Calculation: 464 R Axis:   52 Text Interpretation:  Normal sinus rhythm Nonspecific ST and T wave abnormality Abnormal ECG No significant change since last tracing Confirmed by Brodyn Depuy  MD, Neziah Braley [phone]) on 10/06/2016 8:15:31 PM       Radiology Mr Brain Wo Contrast (neuro Protocol)  Result Date: 10/06/2016 CLINICAL DATA:  Blurry vision for 5 days, worsening tonight. Unsteady gait. Recent stroke. EXAM: MRI HEAD WITHOUT CONTRAST TECHNIQUE: Multiplanar, multiecho pulse sequences of the brain and surrounding structures were obtained without intravenous contrast. COMPARISON:  MRI of the brain October 02, 2016 FINDINGS: Multiple sequences are mildly or moderately motion degraded. BRAIN: Unchanged sub cm foci of reduced diffusion and LEFT parietal and LEFT frontal lobes, LEFT centrum semiovale are unchanged. No susceptibility artifact to suggest hemorrhage. RIGHT temporal occipital lobe encephalomalacia with ex vacuo dilatation subjacent ventricle. Old RIGHT thalamus infarct. No hydrocephalus. No suspicious parenchymal signal, masses or mass effect. Patchy white matter FLAIR T2 hyperintensities are unchanged. Sub cm cystic infarct RIGHT periventricular frontal lobe. Old small LEFT cerebellar infarcts. No abnormal extra-axial fluid  collections. No extra-axial masses though, contrast enhanced sequences would be more sensitive. VASCULAR: Normal major intracranial vascular flow voids present at skull base. SKULL AND UPPER CERVICAL SPINE: No abnormal sellar expansion. No suspicious calvarial bone marrow signal. Craniocervical junction maintained. SINUSES/ORBITS: Trace paranasal sinus mucosal thickening. Mastoid air cells are well aerated. The included ocular globes and orbital contents are non-suspicious. OTHER: None. IMPRESSION: Motion degraded examination. Re- demonstration of acute sub cm LEFT watershed infarcts without propagation or new infarcts. Chronic changes including old RIGHT PCA territory infarct, old small LEFT cerebellar infarcts. Electronically Signed   By: Awilda Metroourtnay  Bloomer M.D.   On: 10/06/2016 22:38    Procedures Procedures (including critical care time)  Medications Ordered in ED Medications  ondansetron [facility](ZOFRAN) injection 4 mg (4 mg Intravenous Given 10/06/16 2237)  HYDROmorphone (DILAUDID) injection 0.5 mg (0.5 mg Intravenous Given 10/06/16 2238)     Initial Impression / Assessment and Plan / ED Course  I have reviewed the triage vital signs and the nursing notes.  Pertinent labs & imaging results that were available during my care of the patient were reviewed by me and considered in my medical decision making (see chart for details).  Clinical Course    Patient seen November 24 with concerns with recurrent stroke. Patient had MRI at that time did show some subtle changes at the site of the previous stroke from August. Discussed with the neural hospitalist at that time. Patient already  on all potential therapy. Admission not recommended. Patient returns today with similar symptoms and feels that her vision is worse. Lower extremity symptoms are baseline.   MRI today shows no significant changes based on MRI from November 24. [facility]. No acute intervention required.  In  addition patient has been seen by ophthalmology and they determined that the visual changes or not based from the eyes and related to her previous stroke.  Also patient at time of discharge had some itching to her face. No hives no swelling. Patient treated with Benadryl. Could be secondary to the medications that she received here. Final Clinical Impressions(s) / ED Diagnoses   Final diagnoses:  Blurred vision    New Prescriptions New Prescriptions   No medications on file     Vanetta MuldersScott Eara Burruel, MD 10/07/16 0013

## 2016-10-07 NOTE — Telephone Encounter (Signed)
Unable to reach pt but spoke w/ daughter. Appt scheduled for Dec 11 @ 1:30.

## 2016-10-07 NOTE — Telephone Encounter (Signed)
-----   Message from York Spanielharles K Willis, MD sent at 10/07/2016  7:26 AM EST ----- This patient will need a RV in the next 1-2 weeks, thanks.

## 2016-10-19 ENCOUNTER — Ambulatory Visit: Payer: Self-pay | Admitting: Neurology

## 2016-12-07 ENCOUNTER — Telehealth: Payer: Self-pay | Admitting: Neurology

## 2016-12-07 NOTE — Telephone Encounter (Signed)
yes

## 2016-12-07 NOTE — Telephone Encounter (Signed)
Patient has been scheduled to see Eber JonesCarolyn 01/29/17.  Dr. Anne HahnWillis patient per patient she has had a recent stroke in November.

## 2016-12-09 ENCOUNTER — Ambulatory Visit: Payer: Medicare Other | Admitting: Neurology

## 2017-01-29 ENCOUNTER — Ambulatory Visit (INDEPENDENT_AMBULATORY_CARE_PROVIDER_SITE_OTHER): Payer: Medicare HMO | Admitting: Nurse Practitioner

## 2017-01-29 ENCOUNTER — Encounter: Payer: Self-pay | Admitting: Nurse Practitioner

## 2017-01-29 ENCOUNTER — Telehealth: Payer: Self-pay | Admitting: *Deleted

## 2017-01-29 VITALS — BP 105/68 | HR 79 | Ht 64.0 in | Wt 160.2 lb

## 2017-01-29 DIAGNOSIS — I10 Essential (primary) hypertension: Secondary | ICD-10-CM | POA: Diagnosis not present

## 2017-01-29 DIAGNOSIS — I6330 Cerebral infarction due to thrombosis of unspecified cerebral artery: Secondary | ICD-10-CM | POA: Diagnosis not present

## 2017-01-29 DIAGNOSIS — E785 Hyperlipidemia, unspecified: Secondary | ICD-10-CM | POA: Diagnosis not present

## 2017-01-29 DIAGNOSIS — H539 Unspecified visual disturbance: Secondary | ICD-10-CM

## 2017-01-29 NOTE — Progress Notes (Signed)
[facility]  PATIENT: Shannon [name] DOB: 17-May-1951   REASON FOR VISIT: Hospital follow-up for multiple TIA/stroke HISTORY FROM: Patient alone at visit     HISTORY OF PRESENT ILLNESS:01/29/17 CM Shannon [name], 66 year old female returns for follow-up with history of multiple admissions to the emergency room for visual problems sensory changes but most recent admission to [facility] regional on 01/22/2017 was for lower extremity weakness associated with left visual difficulty.  She had a ER visit to  to [facility] on 10/02/2016 at that time she was evaluated for worsening troubles seeing in the left eye and tingling sensations on the left side. 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 Patient had all ready had a full stroke workup in August including CTA of head and neck TEE carotid Dopplers etc. hypercoagulable panel was ordered and all returned within normal results. . Admission to [facility] regional on 01/22/2017 for complaints of increasing weakness left lower extremity associated with increasing visual problems on the left. Patient has prior history of strokes hypertension history of coronary disease and uncontrolled diabetes and hyperlipidemia MRI of the brain no acute findings carotid Doppler negative significant stenosis 2Decho 60-65.% LDL 105 hemoglobin A1c 8.6 Her Lipitor was increased to 80 mg daily She remains on aspirin and Plavix for secondary stroke prevention. She has minimal bruising or bleeding Blood pressure in the office today 105/68. She claims she had recent changes to her diabetic regimen but her blood sugars consistently stay around 200. She has never had any diabetic teaching according to the patient. She has little  understanding of her chronic diseases. She returns for reevaluation. She has not had further stroke or TIA symptoms her most recent admission one week ago. She is  scheduled for loop recorder placement.   HISTORY 06/05/15 Shannon [name] is a 66 year old right-handed white female with a history of a right posterior cerebral artery distribution stroke associated with some left-sided numbness and a dense left homonymous visual field deficit. The patient has been under quite a bit of stress recently. Her husband died one month ago, and one of her grandchildren has a serious medical illness, and may die within the next several weeks. The patient has been under stress from her daughters. Within the last 3-4 days, she has been noting episodes of near-syncope, visual dimming, feeling generally poor. She will feel weak all over, no focal weakness is noted. The patient has not actually fainted. She denies palpitations of the heart. She denies that the episodes of visual dimming are related to any of her medications. She will lie down, and the patient still feels poorly for several hours. She has occasional headaches, she denies any slurred speech or loss of speech. She is followed by psychiatry through Dr. Donell Beers. On several occasions, she has noted that her blood pressure has been either too low or too high with the above events. The systolic pressures may go down into the 70 range. She comes into the office today for an evaluation.  REVIEW OF SYSTEMS: Full 14 system review of systems performed and notable only for those listed, all others are neg:  Constitutional: Fatigue Cardiovascular: neg Ear/Nose/Throat: neg  Skin: neg Eyes: Blurred vision Respiratory: neg Gastroitestinal: neg  Hematology/Lymphatic: neg  Endocrine: neg Musculoskeletal: Joint pain, walking difficulty Allergy/Immunology: neg Neurological: neg Psychiatric: Depression and anxiety  Sleep : neg   ALLERGIES: Allergies  Allergen Reactions  . Sulfa Antibiotics Other (  See Comments)    Childhood allergy, can't remember  . Lisinopril Cough    HOME MEDICATIONS: Outpatient Medications Prior to Visit    Medication Sig Dispense Refill  . ALPRAZolam (XANAX) 0.5 MG tablet Take 0.5 mg by mouth at bedtime.     Marland Kitchen amLODipine (NORVASC) 10 MG tablet Take 10 mg by mouth daily.    . Ascorbic Acid (VITAMIN C) 100 MG tablet Take 100 mg by mouth daily.    Marland Kitchen aspirin EC 81 MG tablet Take 1 tablet (81 mg total) by mouth daily.    Marland Kitchen atorvastatin (LIPITOR) 40 MG tablet TAKE 1 TABLET (40 MG TOTAL) BY MOUTH DAILY. 30 tablet 6  . Cholecalciferol (VITAMIN D-3) 1000 UNITS CAPS Take 1 capsule by mouth daily.    . clopidogrel (PLAVIX) 75 MG tablet TAKE 1 TABLET (75 MG TOTAL) BY MOUTH DAILY WITH BREAKFAST. 30 tablet 6  . diclofenac sodium (VOLTAREN) 1 % GEL Apply topically twice a day 1 Tube 0  . gabapentin (NEURONTIN) 400 MG capsule Take 400 mg by mouth 2 (two) times daily.    Marland Kitchen GARLIC PO Take 1 capsule by mouth daily.    Marland Kitchen glimepiride (AMARYL) 4 MG tablet Take 4 mg by mouth 2 (two) times daily.    . hydrOXYzine (ATARAX/VISTARIL) 10 MG tablet Take 10 mg by mouth 3 (three) times daily as needed for anxiety (take one tablet three times daily as needed for anxiety).    . isosorbide mononitrate (IMDUR) 30 MG 24 hr tablet TAKE 1 TABLET (30 MG TOTAL) BY MOUTH EVERY MORNING. (Patient taking differently: Take 30 mg by mouth at bedtime. ) 90 tablet 3  . JANUVIA 100 MG tablet Take 100 mg by mouth daily.    . Lancets (FREESTYLE) lancets Check blood sugar four times per day. Before each meal and at bedtime 100 each 12  . levothyroxine (SYNTHROID, LEVOTHROID) 25 MCG tablet TAKE 1 TABLET (25 MCG TOTAL) BY MOUTH DAILY BEFORE BREAKFAST. 30 tablet 1  . losartan (COZAAR) 100 MG tablet Take 100 mg by mouth daily.    . methocarbamol (ROBAXIN) 500 MG tablet Take 1 tablet (500 mg total) by mouth every 6 (six) hours as needed for muscle spasms. 20 tablet 0  . metoprolol tartrate (LOPRESSOR) 25 MG tablet TAKE 1 TABLET (25 MG TOTAL) BY MOUTH 2 (TWO) TIMES DAILY. 180 tablet 1  . Multiple Vitamin (MULTIVITAMIN) capsule Take 1 capsule by mouth  daily. Senior vitamin    . nitroGLYCERIN (NITROSTAT) 0.4 MG SL tablet Place 1 tablet (0.4 mg total) under the tongue every 5 (five) minutes as needed. 25 tablet 3  . pantoprazole (PROTONIX) 40 MG tablet Take 1 tablet (40 mg total) by mouth daily. 30 tablet 11  . temazepam (RESTORIL) 7.5 MG capsule Take 7.5 mg by mouth at bedtime.    . vitamin E (VITAMIN E) 400 UNIT capsule Take 400 Units by mouth every morning.    Marland Kitchen FREESTYLE LITE test strip USE 3 TIMES DAILY AS INSTRUCTED 50 each 11  . glimepiride (AMARYL) 2 MG tablet Take 1 tablet (2 mg total) by mouth 2 (two) times daily. (Patient not taking: Reported on 10/02/2016) 60 tablet 3  . glucose blood test strip Check blood sugar before each meal and at bedtime total 4 times per day 100 each 12  . metoprolol tartrate (LOPRESSOR) 25 MG tablet Take 25 mg by mouth daily.    . naproxen (NAPROSYN) 500 MG tablet Take 1 tablet (500 mg total) by mouth 2 (two)  times daily. 30 tablet 0   No facility-administered medications prior to visit.     PAST MEDICAL HISTORY: Past Medical History:  Diagnosis Date  . Anxiety   . Arthritis   . Coronary artery disease    a. s/p PCI with DES to LAD in 2011  (LHC 03/2010: Mid LAD 70%, normal ejection fraction  >> PCI:  2.75 x 15 mm Promus DES to LAD); b. Nuclear Stress Test 05/29/14: No ischemia, EF 59%, Normal; c. NSTEMI 11/2014 >> patient declined LHC b/c of recent CVA  . Depression   . Diabetes mellitus without complication (HCC)   . Dyslipidemia 11/20/2014  . Fibromyalgia 1995  . H/O ischemic right PCA stroke 10/2014   a. Carotid US 10/24/14: No significant ICA stenosis bilaterally  . Hemiparesis and alteration of sensations as late effects of stroke (HCC) 06/05/2015  . Hx of echocardiogram    a. Echocardiogram 10/25/14: Moderate LVH, EF 50-55%, normal wall motion, grade 2 diastolic dysfunction, mild LAE, atrial septal lipomatous hypertrophy  . Hypertension   . Irritable bowel     PAST SURGICAL HISTORY: Past  Surgical History:  Procedure Laterality Date  . CHOLECYSTECTOMY    . CORONARY STENT PLACEMENT    . orif arm fracture Right    forearm    FAMILY HISTORY: Family History  Problem Relation Age of Onset  . Cancer Mother   . Diabetes Mother   . Hypertension Mother   . Heart failure Mother   . CAD Mother   . Heart attack Mother   . Cancer Father   . Diabetes Father   . Hypertension Father   . Heart failure Father   . CAD Father   . Heart attack Father   . Stroke Father   . CAD Brother   . Diabetes Brother   . Hypertension Brother   . CAD Sister   . Diabetes Sister   . Hypertension Sister   . Heart attack Sister   . Heart attack Brother   . Stroke Maternal Aunt     SOCIAL HISTORY: Social History   Social History  . Marital status: Married    Spouse name: N/A  . Number of children: 4  . Years of education: N/A   Occupational History  . retirred    Social History Main Topics  . Smoking status: Never Smoker  . Smokeless tobacco: Never Used  . Alcohol use No  . Drug use: No  . Sexual activity: Not on file   Other Topics Concern  . Not on file   Social History Narrative   Patient is right handed.   Patient drinks about 1 coke daily.   Separated from husband. 03-29-15      PHYSICAL EXAM  Vitals:   01/29/17 1037  BP: 105/68  Pulse: 79  Weight: 160 lb 3.2 oz (72.7 kg)  Height: 5\' 4"  (1.626 m)   Body mass index is 27.5 kg/m.  Generalized: Well developed, in no acute distress  Head: normocephalic and atraumatic,. Oropharynx benign  Neck: Supple, no carotid bruits  Cardiac: Regular rate rhythm, no murmur  Musculoskeletal: No deformity   Neurological examination   Mentation: Alert oriented to time, place, history taking. Attention span and concentration appropriate. Recent and remote memory intact.  Follows all commands speech and language fluent.   Cranial nerve II-XII: Visual acuity 20/30 bilaterally .Pupils were equal round reactive to light  extraocular movements were full, visual field are notable for a dense left homonymous visual field deficit. Facial  sensation and strength were normal. hearing was intact to finger rubbing bilaterally. Uvula tongue midline. head turning and shoulder shrug were normal and symmetric.Tongue protrusion into cheek strength was normal. Motor: normal bulk and tone, full strength in the BUE, BLE, fine finger movements normal, no pronator drift. No focal weakness Sensory: normal and symmetric to light touch, decreased pinprick to both feet and  Vibration normal  Coordination: finger-nose-finger, heel-to-shin bilaterally, no dysmetria, no tremor Reflexes: 1+ upper lower and symmetric , plantar responses were flexor bilaterally. Gait and Station: Rising up from seated position without assistance, wide based  stance,  moderate stride, ambulates with quad cane, no difficulty with turns .Tandem gait was not tested. Romberg negative  DIAGNOSTIC DATA (LABS, IMAGING, TESTING) - I reviewed patient records, labs, notes, testing and imaging myself where available.  Lab Results  Component Value Date   WBC 13.1 (H) 10/06/2016   HGB 15.3 (H) 10/06/2016   HCT 45.0 10/06/2016   MCV 82.0 10/06/2016   PLT 247 10/06/2016      Component Value Date/Time   NA 133 (L) 10/06/2016 2118   K 4.8 10/06/2016 2118   CL 99 (L) 10/06/2016 2118   CO2 22 10/06/2016 2110   GLUCOSE 199 (H) 10/06/2016 2118   BUN 17 10/06/2016 2118   CREATININE 0.80 10/06/2016 2118   CREATININE 0.74 03/14/2014 1554   CALCIUM 9.2 10/06/2016 2110   PROT 7.6 10/06/2016 2110   ALBUMIN 4.5 10/06/2016 2110   AST 31 10/06/2016 2110   ALT 27 10/06/2016 2110   ALKPHOS 85 10/06/2016 2110   BILITOT 1.2 10/06/2016 2110   GFRNONAA >60 10/06/2016 2110   GFRNONAA 86 03/14/2014 1554   GFRAA >60 10/06/2016 2110   GFRAA >89 03/14/2014 1554         ASSESSMENT AND PLAN  66 y.o. year old female  has a past medical history of  Admissions for strokelike  symptoms in August 2017,  09/25/2016  and 01/22/2017 , Chronic left, homonymous hemianopsia,  uncontrolled diabetes, essential hypertension, hyperlipidemia,  here for stroke follow up 01/22/17 MRI of the brain no acute findings carotid Doppler negative significant stenosis 2Decho 60-65.% LDL 105 hemoglobin A1c 8.6 Her Lipitor was increased to 80 mg daily   PLAN: Stressed the importance of management of risk factors to prevent further stroke Continue Plavix and Aspirin for secondary stroke prevention Maintain strict control of hypertension with blood pressure goal below 130/90, today's reading105/68  continue antihypertensive medications Control of diabetes with hemoglobin A1c below 6.5 followed by primary care most recent hemoglobin A1c 8.6 continue diabetic medications Cholesterol with LDL cholesterol less than 70, followed by primary care,  most recent 105 continue statin drugs Patient advised to follow-up with her primary care for stroke risk management/labs Exercise by walking, use cane , eat healthy diet with whole grains,  fresh fruits and vegetables Discussed risk for recurrent stroke/ TIA and answered additional questions Follow up 4 months This was a prolonged visit requiring 40 minutes and medical decision making of high complexity with extensive review of history, hospital chart, 3 separate admissions for stroke counseling and answering questions. Patient was advised to keep her follow-up appointment for loop recorder placement. She would probably benefit from diabetic teaching as well Nilda RiggsNancy Carolyn Omran Keelin, [facility] MeetingGNP, [facility] ManagementBC, APRN  [facility] [address], [address] [place], [place] [address] [phone]

## 2017-01-29 NOTE — Progress Notes (Signed)
I have read the note, and I agree with the clinical assessment and plan.  Shannon [name]   

## 2017-01-29 NOTE — Telephone Encounter (Signed)
To call and make sure taking her atorvastatin 80 mg daily as recommended.  No answer.

## 2017-01-29 NOTE — Patient Instructions (Signed)
Stressed the importance of management of risk factors to prevent further stroke Continue Plavix and Aspirin for secondary stroke prevention Maintain strict control of hypertension with blood pressure goal below 130/90, today's reading105/68  continue antihypertensive medications Control of diabetes with hemoglobin A1c below 6.5 followed by primary care most recent hemoglobin A1c 8.6 continue diabetic medications Cholesterol with LDL cholesterol less than 70, followed by primary care,  most recent 105 continue statin drugs Exercise by walking, use cane , eat healthy diet with whole grains,  fresh fruits and vegetables Discussed risk for recurrent stroke/ TIA and answered additional questions Follow up 4 months

## 2017-02-01 NOTE — Telephone Encounter (Signed)
Spoke to pt and she will take 80mg  lipitor daily starting today.  Relayed that CM/NP [facility] that  Pt was to increase, but did not as of her visit with us.  She is to f/u with her pcp, or Dr. Anne FuSkains about further refills of this medication.

## 2017-02-19 ENCOUNTER — Other Ambulatory Visit: Payer: Self-pay | Admitting: *Deleted

## 2017-02-19 MED ORDER — CLOPIDOGREL BISULFATE 75 MG PO TABS: ORAL_TABLET | ORAL | 1 refills | Status: DC

## 2017-04-09 ENCOUNTER — Encounter (HOSPITAL_COMMUNITY): Payer: Self-pay | Admitting: *Deleted

## 2017-04-09 ENCOUNTER — Emergency Department (HOSPITAL_COMMUNITY): Payer: Medicare HMO

## 2017-04-09 ENCOUNTER — Inpatient Hospital Stay (HOSPITAL_COMMUNITY)
Admission: EM | Admit: 2017-04-09 | Discharge: 2017-04-13 | DRG: 065 | Disposition: A | Discharge status: Home Health | Payer: Medicare HMO | Attending: Internal Medicine | Admitting: Internal Medicine

## 2017-04-09 DIAGNOSIS — Z9049 Acquired absence of other specified parts of digestive tract: Secondary | ICD-10-CM

## 2017-04-09 DIAGNOSIS — Z882 Allergy status to sulfonamides status: Secondary | ICD-10-CM

## 2017-04-09 DIAGNOSIS — H539 Unspecified visual disturbance: Secondary | ICD-10-CM

## 2017-04-09 DIAGNOSIS — I700 Atherosclerosis of aorta: Secondary | ICD-10-CM | POA: Diagnosis present

## 2017-04-09 DIAGNOSIS — H53461 Homonymous bilateral field defects, right side: Secondary | ICD-10-CM | POA: Diagnosis present

## 2017-04-09 DIAGNOSIS — I252 Old myocardial infarction: Secondary | ICD-10-CM

## 2017-04-09 DIAGNOSIS — E039 Hypothyroidism, unspecified: Secondary | ICD-10-CM | POA: Diagnosis not present

## 2017-04-09 DIAGNOSIS — R42 Dizziness and giddiness: Secondary | ICD-10-CM | POA: Diagnosis not present

## 2017-04-09 DIAGNOSIS — Z7902 Long term (current) use of antithrombotics/antiplatelets: Secondary | ICD-10-CM

## 2017-04-09 DIAGNOSIS — I10 Essential (primary) hypertension: Secondary | ICD-10-CM | POA: Diagnosis not present

## 2017-04-09 DIAGNOSIS — R531 Weakness: Secondary | ICD-10-CM

## 2017-04-09 DIAGNOSIS — I69398 Other sequelae of cerebral infarction: Secondary | ICD-10-CM

## 2017-04-09 DIAGNOSIS — Z888 Allergy status to other drugs, medicaments and biological substances status: Secondary | ICD-10-CM

## 2017-04-09 DIAGNOSIS — Z955 Presence of coronary angioplasty implant and graft: Secondary | ICD-10-CM

## 2017-04-09 DIAGNOSIS — H53462 Homonymous bilateral field defects, left side: Secondary | ICD-10-CM | POA: Diagnosis present

## 2017-04-09 DIAGNOSIS — I63512 Cerebral infarction due to unspecified occlusion or stenosis of left middle cerebral artery: Principal | ICD-10-CM | POA: Diagnosis present

## 2017-04-09 DIAGNOSIS — I639 Cerebral infarction, unspecified: Secondary | ICD-10-CM

## 2017-04-09 DIAGNOSIS — Z7982 Long term (current) use of aspirin: Secondary | ICD-10-CM

## 2017-04-09 DIAGNOSIS — M1990 Unspecified osteoarthritis, unspecified site: Secondary | ICD-10-CM | POA: Diagnosis present

## 2017-04-09 DIAGNOSIS — G459 Transient cerebral ischemic attack, unspecified: Secondary | ICD-10-CM

## 2017-04-09 DIAGNOSIS — Z809 Family history of malignant neoplasm, unspecified: Secondary | ICD-10-CM

## 2017-04-09 DIAGNOSIS — I2510 Atherosclerotic heart disease of native coronary artery without angina pectoris: Secondary | ICD-10-CM | POA: Diagnosis present

## 2017-04-09 DIAGNOSIS — Z833 Family history of diabetes mellitus: Secondary | ICD-10-CM

## 2017-04-09 DIAGNOSIS — E1165 Type 2 diabetes mellitus with hyperglycemia: Secondary | ICD-10-CM | POA: Diagnosis not present

## 2017-04-09 DIAGNOSIS — Z8249 Family history of ischemic heart disease and other diseases of the circulatory system: Secondary | ICD-10-CM

## 2017-04-09 DIAGNOSIS — Z823 Family history of stroke: Secondary | ICD-10-CM

## 2017-04-09 DIAGNOSIS — I69354 Hemiplegia and hemiparesis following cerebral infarction affecting left non-dominant side: Secondary | ICD-10-CM

## 2017-04-09 DIAGNOSIS — F419 Anxiety disorder, unspecified: Secondary | ICD-10-CM | POA: Diagnosis present

## 2017-04-09 DIAGNOSIS — Z66 Do not resuscitate: Secondary | ICD-10-CM | POA: Diagnosis present

## 2017-04-09 DIAGNOSIS — E785 Hyperlipidemia, unspecified: Secondary | ICD-10-CM | POA: Diagnosis present

## 2017-04-09 DIAGNOSIS — M797 Fibromyalgia: Secondary | ICD-10-CM | POA: Diagnosis present

## 2017-04-09 LAB — COMPREHENSIVE METABOLIC PANEL
ALBUMIN: 4 g/dL (ref 3.5–5.0)
ALT: 28 U/L (ref 14–54)
ANION GAP: 12 (ref 5–15)
AST: 30 U/L (ref 15–41)
Alkaline Phosphatase: 69 U/L (ref 38–126)
BUN: 15 mg/dL (ref 6–20)
CHLORIDE: 102 mmol/L (ref 101–111)
CO2: 21 mmol/L — AB (ref 22–32)
Calcium: 9.6 mg/dL (ref 8.9–10.3)
Creatinine, Ser: 1.08 mg/dL — ABNORMAL HIGH (ref 0.44–1.00)
GFR calc Af Amer: >60 mL/min (ref >60)
GFR calc non Af Amer: 52 mL/min — ABNORMAL LOW (ref >60)
GLUCOSE: 328 mg/dL — AB (ref 65–99)
POTASSIUM: 3.4 mmol/L — AB (ref 3.5–5.1)
SODIUM: 135 mmol/L (ref 135–145)
Total Bilirubin: 0.6 mg/dL (ref 0.3–1.2)
Total Protein: 7 g/dL (ref 6.5–8.1)

## 2017-04-09 LAB — CBC WITH DIFFERENTIAL/PLATELET
BASOS PCT: 0 %
Basophils Absolute: 0 10*3/uL (ref 0.0–0.1)
Eosinophils Absolute: 0.1 10*3/uL (ref 0.0–0.7)
Eosinophils Relative: 2 %
HEMATOCRIT: 39.3 % (ref 36.0–46.0)
HEMOGLOBIN: 13.6 g/dL (ref 12.0–15.0)
LYMPHS ABS: 1.7 10*3/uL (ref 0.7–4.0)
LYMPHS PCT: 20 %
MCH: 28.3 pg (ref 26.0–34.0)
MCHC: 34.6 g/dL (ref 30.0–36.0)
MCV: 81.7 fL (ref 78.0–100.0)
MONOS PCT: 8 %
Monocytes Absolute: 0.7 10*3/uL (ref 0.1–1.0)
NEUTROS ABS: 5.9 10*3/uL (ref 1.7–7.7)
NEUTROS PCT: 70 %
Platelets: 198 10*3/uL (ref 150–400)
RBC: 4.81 MIL/uL (ref 3.87–5.11)
RDW: 13.2 % (ref 11.5–15.5)
WBC: 8.5 10*3/uL (ref 4.0–10.5)

## 2017-04-09 LAB — URINALYSIS, ROUTINE W REFLEX MICROSCOPIC
BACTERIA UA: NONE SEEN
BILIRUBIN URINE: NEGATIVE
Glucose, UA: >=500 mg/dL — AB
Hgb urine dipstick: NEGATIVE
Ketones, ur: NEGATIVE mg/dL
Leukocytes, UA: NEGATIVE
NITRITE: NEGATIVE
Protein, ur: NEGATIVE mg/dL
SPECIFIC GRAVITY, URINE: 1 — AB (ref 1.005–1.030)
SQUAMOUS EPITHELIAL / LPF: NONE SEEN
pH: 5 (ref 5.0–8.0)

## 2017-04-09 LAB — TROPONIN I: Troponin I: <0.03 ng/mL (ref <0.03)

## 2017-04-09 MED ORDER — SODIUM CHLORIDE 0.9 % IV BOLUS (SEPSIS)
1000.0000 mL | Freq: Once | INTRAVENOUS | Status: AC
  Administered 2017-04-09: 1000 mL via INTRAVENOUS

## 2017-04-09 MED ORDER — ASPIRIN 325 MG PO TABS
325.0000 mg | ORAL_TABLET | Freq: Once | ORAL | Status: AC
  Administered 2017-04-09: 325 mg via ORAL
  Filled 2017-04-09: qty 1

## 2017-04-09 MED ORDER — LORAZEPAM 2 MG/ML IJ SOLN
1.0000 mg | Freq: Once | INTRAMUSCULAR | Status: AC
  Administered 2017-04-09: 1 mg via INTRAVENOUS
  Filled 2017-04-09: qty 1

## 2017-04-09 MED ORDER — ENSURE ENLIVE PO LIQD
237.0000 mL | Freq: Two times a day (BID) | ORAL | Status: DC
  Administered 2017-04-10 – 2017-04-11 (×3): 237 mL via ORAL

## 2017-04-09 MED ORDER — ONDANSETRON HCL 4 MG/2ML IJ SOLN: 4.0000 mg | Freq: Four times a day (QID) | INTRAMUSCULAR | Status: DC | PRN

## 2017-04-09 MED ORDER — ALPRAZOLAM 0.5 MG PO TABS
0.5000 mg | ORAL_TABLET | Freq: Three times a day (TID) | ORAL | Status: DC | PRN
  Administered 2017-04-11 (×2): 0.5 mg via ORAL
  Filled 2017-04-09 (×2): qty 1

## 2017-04-09 MED ORDER — ONDANSETRON HCL 4 MG PO TABS: 4.0000 mg | ORAL_TABLET | Freq: Four times a day (QID) | ORAL | Status: DC | PRN

## 2017-04-09 MED ORDER — MECLIZINE HCL 12.5 MG PO TABS
25.0000 mg | ORAL_TABLET | Freq: Once | ORAL | Status: AC
  Administered 2017-04-09: 25 mg via ORAL
  Filled 2017-04-09: qty 2

## 2017-04-09 MED ORDER — ISOSORBIDE MONONITRATE ER 60 MG PO TB24
30.0000 mg | ORAL_TABLET | Freq: Every day | ORAL | Status: DC
  Administered 2017-04-09 – 2017-04-12 (×4): 30 mg via ORAL
  Filled 2017-04-09 (×4): qty 1

## 2017-04-09 MED ORDER — POTASSIUM CHLORIDE CRYS ER 20 MEQ PO TBCR
40.0000 meq | EXTENDED_RELEASE_TABLET | Freq: Once | ORAL | Status: AC
  Administered 2017-04-09: 40 meq via ORAL
  Filled 2017-04-09: qty 2

## 2017-04-09 MED ORDER — SODIUM CHLORIDE 0.9 % IV SOLN
INTRAVENOUS | Status: DC
  Administered 2017-04-09 – 2017-04-13 (×7): via INTRAVENOUS

## 2017-04-09 MED ORDER — TEMAZEPAM 15 MG PO CAPS
30.0000 mg | ORAL_CAPSULE | Freq: Every day | ORAL | Status: DC
  Administered 2017-04-09 – 2017-04-12 (×4): 30 mg via ORAL
  Filled 2017-04-09 (×4): qty 2

## 2017-04-09 MED ORDER — DOXEPIN HCL 10 MG PO CAPS
10.0000 mg | ORAL_CAPSULE | Freq: Every day | ORAL | Status: DC
  Administered 2017-04-10 – 2017-04-12 (×3): 10 mg via ORAL
  Filled 2017-04-09 (×5): qty 1

## 2017-04-09 MED ORDER — LEVOTHYROXINE SODIUM 25 MCG PO TABS
25.0000 ug | ORAL_TABLET | Freq: Every day | ORAL | Status: DC
  Administered 2017-04-10 – 2017-04-13 (×3): 25 ug via ORAL
  Filled 2017-04-09 (×4): qty 1

## 2017-04-09 MED ORDER — ASPIRIN EC 81 MG PO TBEC
81.0000 mg | DELAYED_RELEASE_TABLET | Freq: Every day | ORAL | Status: DC
Start: 2017-04-10 — End: 2017-04-13
  Administered 2017-04-10 – 2017-04-13 (×3): 81 mg via ORAL
  Filled 2017-04-09 (×3): qty 1

## 2017-04-09 MED ORDER — INSULIN ASPART 100 UNIT/ML ~~LOC~~ SOLN
0.0000 [IU] | Freq: Three times a day (TID) | SUBCUTANEOUS | Status: DC
  Administered 2017-04-10: 3 [IU] via SUBCUTANEOUS
  Administered 2017-04-10: 1 [IU] via SUBCUTANEOUS
  Administered 2017-04-11: 2 [IU] via SUBCUTANEOUS
  Administered 2017-04-11 – 2017-04-12 (×3): 1 [IU] via SUBCUTANEOUS

## 2017-04-09 MED ORDER — VITAMIN D 1000 UNITS PO TABS
1000.0000 [IU] | ORAL_TABLET | Freq: Every day | ORAL | Status: DC
  Administered 2017-04-09 – 2017-04-13 (×4): 1000 [IU] via ORAL
  Filled 2017-04-09 (×2): qty 1

## 2017-04-09 MED ORDER — CLOPIDOGREL BISULFATE 75 MG PO TABS
75.0000 mg | ORAL_TABLET | Freq: Every day | ORAL | Status: DC
  Administered 2017-04-10 – 2017-04-13 (×3): 75 mg via ORAL
  Filled 2017-04-09 (×3): qty 1

## 2017-04-09 MED ORDER — ATORVASTATIN CALCIUM 40 MG PO TABS
80.0000 mg | ORAL_TABLET | Freq: Every evening | ORAL | Status: DC
  Administered 2017-04-09 – 2017-04-12 (×4): 80 mg via ORAL
  Filled 2017-04-09 (×4): qty 2

## 2017-04-09 MED ORDER — VITAMIN C 100 MG PO TABS
100.0000 mg | ORAL_TABLET | Freq: Every day | ORAL | Status: DC
  Filled 2017-04-09 (×3): qty 1

## 2017-04-09 MED ORDER — ENOXAPARIN SODIUM 40 MG/0.4ML ~~LOC~~ SOLN
40.0000 mg | SUBCUTANEOUS | Status: DC
  Administered 2017-04-09 – 2017-04-12 (×4): 40 mg via SUBCUTANEOUS
  Filled 2017-04-09 (×4): qty 0.4

## 2017-04-09 MED ORDER — ONDANSETRON HCL 4 MG/2ML IJ SOLN
4.0000 mg | Freq: Once | INTRAMUSCULAR | Status: AC
  Administered 2017-04-09: 4 mg via INTRAVENOUS
  Filled 2017-04-09: qty 2

## 2017-04-09 MED ORDER — GABAPENTIN 400 MG PO CAPS
400.0000 mg | ORAL_CAPSULE | Freq: Two times a day (BID) | ORAL | Status: DC
  Administered 2017-04-09 – 2017-04-13 (×7): 400 mg via ORAL
  Filled 2017-04-09 (×7): qty 1

## 2017-04-09 NOTE — H&P (Signed)
TRH H&P    Patient Demographics:    Shannon [name], is a 66 y.o. female  MRN: [id number]  DOB - 12/14/1950  Admit Date - 04/09/2017  Referring MD/NP/PA: Dr Particia Nearing  Outpatient Primary MD for the patient is Nila Nephew, MD  Patient coming from: home  Chief Complaint  Patient presents with  . Dizziness      HPI:    Shannon [name]  is a 66 y.o. female, With history of right posterior cerebral artery distribution stroke associated with some left-sided numbness and dense left homonymous visual field deficit, hypertension, diabetes mellitus who came to hospital with 3-4 days history of dizziness and blurry vision. Patient has had multiple admissions for stroke workup in the past. Her last admission was to [facility] on 01/22/2017 for complaints of increasing weakness of left lower extremity with increasing visual problems on left, at that time she had full stroke workupMRI of the brain no acute findings carotid Doppler negative significant stenosis 2Decho 60-65.% LDL 105 hemoglobin A1c 8.6 Her Lipitor was increased to 80 mg daily. Patient currently takes aspirin and Plavix at home.  At this time patient is unable to provide significant history as appears very stressed, unable to complete one sentence and starts crying. She says that she has been undergoing a lot of stress lately due to her daughter. Of note that patient was seen in March 2018 by [facility] neurology and had similar complaints, at that time also patient was stressed due to her daughter.  She denies chest pain or shortness of breath. No nausea vomiting or diarrhea. Not able to provide good history except for dizziness and blurred vision in left eye which is chronic.  In the ED today, CT head showed interval development of 8 mm hypoattenuating lesion posterior left frontal white matter, may be subacute to chronic ischemia.    Review of systems:       A full 10 point Review of Systems was done, except as stated above, all other Review of Systems were negative.   With Past History of the following :    Past Medical History:  Diagnosis Date  . Anxiety   . Arthritis   . Coronary artery disease    a. s/p PCI with DES to LAD in 2011  (LHC 03/2010: Mid LAD 70%, normal ejection fraction  >> PCI:  2.75 x 15 mm Promus DES to LAD); b. Nuclear Stress Test 05/29/14: No ischemia, EF 59%, Normal; c. NSTEMI 11/2014 >> patient declined LHC b/c of recent CVA  . Depression   . Diabetes mellitus without complication (HCC)   . Dyslipidemia 11/20/2014  . Fibromyalgia 1995  . H/O ischemic right PCA stroke 10/2014   a. Carotid US 10/24/14: No significant ICA stenosis bilaterally  . Hemiparesis and alteration of sensations as late effects of stroke (HCC) 06/05/2015  . Hx of echocardiogram    a. Echocardiogram 10/25/14: Moderate LVH, EF 50-55%, normal wall motion, grade 2 diastolic dysfunction, mild LAE, atrial septal lipomatous hypertrophy  . Hypertension   . Irritable bowel  Past Surgical History:  Procedure Laterality Date  . CHOLECYSTECTOMY    . CORONARY STENT PLACEMENT    . orif arm fracture Right    forearm      Social History:      Social History  Substance Use Topics  . Smoking status: Never Smoker  . Smokeless tobacco: Never Used  . Alcohol use No       Family History :     Family History  Problem Relation Age of Onset  . Cancer Mother   . Diabetes Mother   . Hypertension Mother   . Heart failure Mother   . CAD Mother   . Heart attack Mother   . Cancer Father   . Diabetes Father   . Hypertension Father   . Heart failure Father   . CAD Father   . Heart attack Father   . Stroke Father   . CAD Brother   . Diabetes Brother   . Hypertension Brother   . CAD Sister   . Diabetes Sister   . Hypertension Sister   . Heart attack Sister   . Heart attack Brother   . Stroke Maternal Aunt       Home Medications:    Prior to Admission medications   Medication Sig Start Date End Date Taking? Authorizing Provider  ALPRAZolam Prudy Feeler) 0.5 MG tablet Take 0.5 mg by mouth at bedtime.  03/07/15  Yes [provider]  amLODipine (NORVASC) 10 MG tablet Take 10 mg by mouth daily. 09/11/16  Yes [provider]  Ascorbic Acid (VITAMIN C) 100 MG tablet Take 100 mg by mouth daily.   Yes [provider]  aspirin EC 81 MG tablet Take 1 tablet (81 mg total) by mouth daily. 11/27/14  Yes Weaver, Scott T, PA-C  atorvastatin (LIPITOR) 80 MG tablet Take 80 mg by mouth every evening.    Yes [provider]  Cholecalciferol (VITAMIN D-3) 1000 UNITS CAPS Take 1 capsule by mouth daily.   Yes [provider]  clopidogrel (PLAVIX) 75 MG tablet TAKE 1 TABLET (75 MG TOTAL) BY MOUTH DAILY WITH BREAKFAST. 02/19/17  Yes Jake Bathe, MD  doxepin (SINEQUAN) 10 MG capsule Take 10 mg by mouth at bedtime.   Yes [provider]  gabapentin (NEURONTIN) 400 MG capsule Take 400 mg by mouth 2 (two) times daily. 07/29/16  Yes [provider]  GARLIC PO Take 1 capsule by mouth daily.   Yes [provider]  glimepiride (AMARYL) 4 MG tablet Take 4 mg by mouth 2 (two) times daily. 09/03/16  Yes [provider]  hydrOXYzine (ATARAX/VISTARIL) 25 MG tablet Take 25 mg by mouth 3 (three) times daily as needed for anxiety.    Yes [provider]  isosorbide mononitrate (IMDUR) 30 MG 24 hr tablet TAKE 1 TABLET (30 MG TOTAL) BY MOUTH EVERY MORNING. Patient taking differently: Take 30 mg by mouth at bedtime.  04/14/16  Yes Jake Bathe, MD  JANUVIA 100 MG tablet Take 100 mg by mouth daily. 09/11/16  Yes [provider]  Lancets (FREESTYLE) lancets Check blood sugar four times per day. Before each meal and at bedtime 07/19/14  Yes Holland Commons A, NP  levothyroxine (SYNTHROID, LEVOTHROID) 25 MCG tablet TAKE 1 TABLET (25 MCG TOTAL) BY MOUTH DAILY BEFORE BREAKFAST. 07/30/15   Yes Jake Bathe, MD  losartan (COZAAR) 100 MG tablet Take 100 mg by mouth daily. 07/29/16  Yes [provider]  metoprolol tartrate (LOPRESSOR) 25 MG  tablet TAKE 1 TABLET (25 MG TOTAL) BY MOUTH 2 (TWO) TIMES DAILY. 02/20/16  Yes Jake BatheSkains, Mark C, MD  Multiple Vitamin (MULTIVITAMIN) capsule Take 1 capsule by mouth daily. Senior vitamin   Yes [provider]  nitroGLYCERIN (NITROSTAT) 0.4 MG SL tablet Place 1 tablet (0.4 mg total) under the tongue every 5 (five) minutes as needed. 11/27/14  Yes Weaver, Scott T, PA-C  temazepam (RESTORIL) 15 MG capsule Take 30 mg by mouth at bedtime.    Yes [provider]  vitamin E (VITAMIN E) 400 UNIT capsule Take 400 Units by mouth every morning.   Yes [provider]     Allergies:     Allergies  Allergen Reactions  . Sulfa Antibiotics Other (See Comments)    Childhood allergy, can't remember  . Lisinopril Cough     Physical Exam:   Vitals  Blood pressure (!) 123/58, pulse 76, temperature 97.6 F (36.4 C), temperature source Oral, resp. rate 13, height 5\' 4"  (1.626 m), weight 72.6 kg (160 lb), SpO2 94 %.  1.  General: Appears very anxious, easily distracted  2. Psychiatric:  Intact judgement and  insight, awake alert, oriented x 3.  3. Neurologic: No focal neurological deficits, left homonymous hemianopia.Strength 5/5 all 4 extremities, rest of neurogenic examination could not be completed as patient does not operative and very anxious  4. Eyes :  anicteric sclerae, moist conjunctivae with no lid lag. PERRLA.  5. ENMT:  Oropharynx clear with moist mucous membranes and good dentition  6. Neck:  supple, no cervical lymphadenopathy appriciated, No thyromegaly  7. Respiratory : Normal respiratory effort, good air movement bilaterally,clear to  auscultation bilaterally  8. Cardiovascular : RRR, no gallops, rubs or murmurs, no leg edema  9. Gastrointestinal:  Positive bowel sounds, abdomen soft,  non-tender to palpation,no hepatosplenomegaly, no rigidity or guarding       10. Skin:  No cyanosis, normal texture and turgor, no rash, lesions or ulcers  11.Musculoskeletal:  Good muscle tone,  joints appear normal , no effusions,  normal range of motion    Data Review:    CBC  Recent Labs Lab 04/09/17 1600  WBC 8.5  HGB 13.6  HCT 39.3  PLT 198  MCV 81.7  MCH 28.3  MCHC 34.6  RDW 13.2  LYMPHSABS 1.7  MONOABS 0.7  EOSABS 0.1  BASOSABS 0.0   ------------------------------------------------------------------------------------------------------------------  Chemistries   Recent Labs Lab 04/09/17 1600  NA 135  K 3.4*  CL 102  CO2 21*  GLUCOSE 328*  BUN 15  CREATININE 1.08*  CALCIUM 9.6  AST 30  ALT 28  ALKPHOS 69  BILITOT 0.6   ------------------------------------------------------------------------------------------------------------------  ------------------------------------------------------------------------------------------------------------------ GFR: Estimated Creatinine Clearance: 49.4 mL/min (A) (by C-G formula based on SCr of 1.08 mg/dL (H)). Liver Function Tests:  Recent Labs Lab 04/09/17 1600  AST 30  ALT 28  ALKPHOS 69  BILITOT 0.6  PROT 7.0  ALBUMIN 4.0   No results for input(s): LIPASE, AMYLASE in the last 168 hours. No results for input(s): AMMONIA in the last 168 hours. Coagulation Profile: No results for input(s): INR, PROTIME in the last 168 hours. Cardiac Enzymes:  Recent Labs Lab 04/09/17 1600  TROPONINI <0.03    --------------------------------------------------------------------------------------------------------------- Urine analysis:    Component Value Date/Time   COLORURINE STRAW (A) 04/09/2017 1800   APPEARANCEUR CLEAR 04/09/2017 1800   LABSPEC 1.000 (L) 04/09/2017 1800   PHURINE 5.0 04/09/2017 1800   GLUCOSEU >=500 (A) 04/09/2017 1800   HGBUR NEGATIVE 04/09/2017  1800   BILIRUBINUR NEGATIVE 04/09/2017  1800   BILIRUBINUR neg 04/13/2014 1733   KETONESUR NEGATIVE 04/09/2017 1800   PROTEINUR NEGATIVE 04/09/2017 1800   UROBILINOGEN 0.2 04/13/2014 1733   NITRITE NEGATIVE 04/09/2017 1800   LEUKOCYTESUR NEGATIVE 04/09/2017 1800      Imaging Results:    Dg Chest 2 View  Result Date: 04/09/2017 CLINICAL DATA:  Dizziness. EXAM: CHEST  2 VIEW COMPARISON:  Radiograph of January 22, 2017. FINDINGS: The heart size and mediastinal contours are within normal limits. Both lungs are clear. No pneumothorax or pleural effusion is noted. Atherosclerosis of thoracic aorta is noted. The visualized skeletal structures are unremarkable. IMPRESSION: No active cardiopulmonary disease.  Aortic atherosclerosis. Electronically Signed   By: Lupita Raider, M.D.   On: 04/09/2017 16:45   Ct Head Wo Contrast  Result Date: 04/09/2017 CLINICAL DATA:  Dizziness since 04/07/2017 EXAM: CT HEAD WITHOUT CONTRAST TECHNIQUE: Contiguous axial images were obtained from the base of the skull through the vertex without intravenous contrast. COMPARISON:  01/22/2017 FINDINGS: Brain: No evidence for acute hemorrhage, hydrocephalus, or abnormal extra-axial fluid collection. Old right PCA infarct again noted. 18 mm hypoattenuating focus identified in the left frontal hemispheric white matter, new in the interval. Lacunar infarct identified in the anterior corpus callosum with scattered periventricular deep white matter ischemic changes noted bilaterally. Vascular: No hyperdense vessel or unexpected calcification. Skull: No evidence for fracture. No worrisome lytic or sclerotic lesion. Sinuses/Orbits: The visualized paranasal sinuses and mastoid air cells are clear. Visualized portions of the globes and intraorbital fat are unremarkable. Other: None. IMPRESSION: 1. Interval development of an 18 mm hypoattenuating lesion posterior left frontal white matter. This may be an area of subacute to chronic ischemia. Edema from underlying mass lesion  considered less likely. MRI without and with contrast recommended to further evaluate. 2. 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 Electronically Signed   By: Kennith Center M.D.   On: 04/09/2017 17:13    My personal review of EKG: Rhythm NSR   Assessment & Plan:    Active Problems:   Hypertension, essential   Weakness   Hypothyroidism   Visual disturbance   Dizziness   1. Dizziness/blurred vision- not clear if these aret new symptoms as patient has had stroke in right posterior artery circulation with chronic left homonymous hemianopia. We'll place under observation, obtain MRI brain in a.m. continue aspirin and Plavix. 2. Anxiety- patient appears very anxious, easily distracted. Intermittent crying spells while talking, she had similar complaints in March 2018 when she was seen by [facility]. We'll start Xanax 0.5 mg 3 times a day when necessary. It appears that patient is under stress due to her daughter, did not want me to call her daughter. Consult Child psychotherapist for crisis counseling. 3. Diabetes mellitus- hold glipizide, will start sliding scale insulin with NovoLog. 4. Hypothyroidism-continue Synthroid 25 g daily 5. Hypertension- will hold antihypertensive medications at this time for permissive hypertension. 6.  CAD, s/p PCI with DES to LAD in 2011- continue aspirin, plavix, nitro.   DVT Prophylaxis-   Lovenox   AM Labs Ordered, also please review Full Orders  Family Communication: Admission, patients condition and plan of care including tests being ordered have been discussed with the patient  who indicate understanding and agree with the plan and Code Status.  Code Status:  DO NOT RESUSCITATE  Admission status: Observation  Time spent in minutes : 60 minutes  Mauro Kaufmann S M.D on 04/09/2017 at 7:59 PM  Between 7am to 7pm - Pager - [phone]. After 7pm go to  [email or web address] - password [email or web address]  [facility] - Office  [phone]

## 2017-04-09 NOTE — ED Provider Notes (Signed)
[facility]Y DEPT Provider Note   CSN: [id number] Arrival date & time: 04/09/17  1555     History   Chief Complaint Chief Complaint  Patient presents with  . Dizziness    HPI Shannon [name] is a 66 y.o. female.  Pt presents to the ED today with dizziness and blurry vision.  The pt said sx have been going on for several days.  The pt is worried that she may have had another stroke.  The pt has had prior strokes and is on plavix for CVA.  Her last admission for possible stroke was in March for visual changes and left leg weakness.  The MRI done then showed no stroke.  Mri Brain Wo Contrast  Result Date: 01/23/2017 CLINICAL DATA: Stroke. Weakness. Visual changes. EXAM: MRI HEAD WITHOUT CONTRAST TECHNIQUE: Multiplanar, multiecho pulse sequences of the brain and surrounding structures were obtained without intravenous contrast. COMPARISON: CT 01/22/2017. 10/06/2016. FINDINGS: Brain: Diffusion imaging does not show any acute or subacute infarction. The brainstem and cerebellum are normal. There is old infarction in the right PCA territory affecting the posteromedial temporal lobe and occipital lobe which has progressed to atrophy, encephalomalacia and gliosis. There are scattered foci of old infarction in watershed distribution on the left affecting the deep and subcortical white matter front to back. No large confluent infarction on the left. No mass lesion, hemorrhage, hydrocephalus or extra-axial collection. Some residual hemosiderin deposition in the right PCA stroke region. Vascular: Major vessels at the base of the brain show flow. Skull and upper cervical spine: Negative Sinuses/Orbits: Clear/normal Other: None significant   No acute finding by MRI. Old infarction in the right PCA territory. Old watershed distribution small vessel insults of the left hemisphere. Electronically Signed By: Paulina Fusi M.D. On: 01/23/2017 11:06           Past Medical History:  Diagnosis Date  .  Anxiety   . Arthritis   . Coronary artery disease    a. s/p PCI with DES to LAD in 2011  (LHC 03/2010: Mid LAD 70%, normal ejection fraction  >> PCI:  2.75 x 15 mm Promus DES to LAD); b. Nuclear Stress Test 05/29/14: No ischemia, EF 59%, Normal; c. NSTEMI 11/2014 >> patient declined LHC b/c of recent CVA  . Depression   . Diabetes mellitus without complication (HCC)   . Dyslipidemia 11/20/2014  . Fibromyalgia 1995  . H/O ischemic right PCA stroke 10/2014   a. Carotid US 10/24/14: No significant ICA stenosis bilaterally  . Hemiparesis and alteration of sensations as late effects of stroke (HCC) 06/05/2015  . Hx of echocardiogram    a. Echocardiogram 10/25/14: Moderate LVH, EF 50-55%, normal wall motion, grade 2 diastolic dysfunction, mild LAE, atrial septal lipomatous hypertrophy  . Hypertension   . Irritable bowel     Patient Active Problem List   Diagnosis Date Noted  . Hemiparesis and alteration of sensations as late effects of stroke (HCC) 06/05/2015  . Numbness 12/27/2014  . Visual disturbance 11/26/2014  . Dyslipidemia 11/20/2014  . Hypothyroidism 11/20/2014  . Pain   . Cerebral thrombosis with cerebral infarction (HCC) 11/19/2014  . Weakness   . NSTEMI (non-ST elevated myocardial infarction) (HCC) 11/17/2014  . Acute CVA -Dec 2015 10/24/2014  . DM type 2 (diabetes mellitus, type 2) (HCC) 10/24/2014  . Accelerated hypertension 10/24/2014  . Atypical chest pain 05/16/2014  . CAD S/P LAD PCI July 2011 07/01/2013  . Hypertension, essential 07/01/2013  . Fibromyalgia 07/01/2013  . IBS (irritable bowel  syndrome) 07/01/2013    Past Surgical History:  Procedure Laterality Date  . CHOLECYSTECTOMY    . CORONARY STENT PLACEMENT    . orif arm fracture Right    forearm    OB History    Gravida Para Term Preterm AB Living   12 4 4   8      SAB TAB Ectopic Multiple Live Births                   Home Medications    Prior to Admission medications   Medication Sig Start Date  End Date Taking? Authorizing Provider  ALPRAZolam Prudy Feeler(XANAX) 0.5 MG tablet Take 0.5 mg by mouth at bedtime.  03/07/15  Yes [provider]  amLODipine (NORVASC) 10 MG tablet Take 10 mg by mouth daily. 09/11/16  Yes [provider]  Ascorbic Acid (VITAMIN C) 100 MG tablet Take 100 mg by mouth daily.   Yes [provider]  aspirin EC 81 MG tablet Take 1 tablet (81 mg total) by mouth daily. 11/27/14  Yes Weaver, Scott T, PA-C  atorvastatin (LIPITOR) 80 MG tablet Take 80 mg by mouth every evening.    Yes [provider]  Cholecalciferol (VITAMIN D-3) 1000 UNITS CAPS Take 1 capsule by mouth daily.   Yes [provider]  clopidogrel (PLAVIX) 75 MG tablet TAKE 1 TABLET (75 MG TOTAL) BY MOUTH DAILY WITH BREAKFAST. 02/19/17  Yes Jake BatheSkains, Mark C, MD  doxepin (SINEQUAN) 10 MG capsule Take 10 mg by mouth at bedtime.   Yes [provider]  gabapentin (NEURONTIN) 400 MG capsule Take 400 mg by mouth 2 (two) times daily. 07/29/16  Yes [provider]  GARLIC PO Take 1 capsule by mouth daily.   Yes [provider]  glimepiride (AMARYL) 4 MG tablet Take 4 mg by mouth 2 (two) times daily. 09/03/16  Yes [provider]  hydrOXYzine (ATARAX/VISTARIL) 25 MG tablet Take 25 mg by mouth 3 (three) times daily as needed for anxiety.    Yes [provider]  isosorbide mononitrate (IMDUR) 30 MG 24 hr tablet TAKE 1 TABLET (30 MG TOTAL) BY MOUTH EVERY MORNING. Patient taking differently: Take 30 mg by mouth at bedtime.  04/14/16  Yes Jake BatheSkains, Mark C, MD  JANUVIA 100 MG tablet Take 100 mg by mouth daily. 09/11/16  Yes [provider]  Lancets (FREESTYLE) lancets Check blood sugar four times per day. Before each meal and at bedtime 07/19/14  Yes Holland CommonsKeck, Valerie A, NP  levothyroxine (SYNTHROID, LEVOTHROID) 25 MCG tablet TAKE 1 TABLET (25 MCG TOTAL) BY MOUTH DAILY BEFORE BREAKFAST. 07/30/15  Yes Jake BatheSkains, Mark C, MD  losartan (COZAAR) 100 MG tablet Take  100 mg by mouth daily. 07/29/16  Yes [provider]  metoprolol tartrate (LOPRESSOR) 25 MG tablet TAKE 1 TABLET (25 MG TOTAL) BY MOUTH 2 (TWO) TIMES DAILY. 02/20/16  Yes Jake BatheSkains, Mark C, MD  Multiple Vitamin (MULTIVITAMIN) capsule Take 1 capsule by mouth daily. Senior vitamin   Yes [provider]  nitroGLYCERIN (NITROSTAT) 0.4 MG SL tablet Place 1 tablet (0.4 mg total) under the tongue every 5 (five) minutes as needed. 11/27/14  Yes Weaver, Scott T, PA-C  temazepam (RESTORIL) 15 MG capsule Take 30 mg by mouth at bedtime.    Yes [provider]  vitamin E (VITAMIN E) 400 UNIT capsule Take 400 Units by mouth every morning.   Yes [provider]    Family History Family History  Problem Relation Age of Onset  .  Cancer Mother   . Diabetes Mother   . Hypertension Mother   . Heart failure Mother   . CAD Mother   . Heart attack Mother   . Cancer Father   . Diabetes Father   . Hypertension Father   . Heart failure Father   . CAD Father   . Heart attack Father   . Stroke Father   . CAD Brother   . Diabetes Brother   . Hypertension Brother   . CAD Sister   . Diabetes Sister   . Hypertension Sister   . Heart attack Sister   . Heart attack Brother   . Stroke Maternal Aunt     Social History Social History  Substance Use Topics  . Smoking status: Never Smoker  . Smokeless tobacco: Never Used  . Alcohol use No     Allergies   Sulfa antibiotics and Lisinopril   Review of Systems Review of Systems  Neurological: Positive for dizziness.  All other systems reviewed and are negative.    Physical Exam Updated Vital Signs BP (!) 103/51   Pulse 72   Temp 97.6 F (36.4 C) (Oral)   Resp 18   Ht 5\' 4"  (1.626 m)   Wt 72.6 kg (160 lb)   SpO2 97%   BMI 27.46 kg/m   Physical Exam  Constitutional: She is oriented to person, place, and time. She appears well-developed and well-nourished.  HENT:  Head: Normocephalic and atraumatic.  Right Ear:  External ear normal.  Left Ear: External ear normal.  Nose: Nose normal.  Mouth/Throat: Oropharynx is clear and moist.  Eyes: Conjunctivae and EOM are normal. Pupils are equal, round, and reactive to light.  Neck: Normal range of motion. Neck supple.  Cardiovascular: Normal rate, regular rhythm, normal heart sounds and intact distal pulses.   Pulmonary/Chest: Effort normal and breath sounds normal.  Abdominal: Soft. Bowel sounds are normal.  Musculoskeletal: Normal range of motion.  Neurological: She is alert and oriented to person, place, and time.  Skin: Skin is warm.  Psychiatric: She has a normal mood and affect. Her behavior is normal. Judgment and thought content normal.  Nursing note and vitals reviewed.    ED Treatments / Results  Labs (all labs ordered are listed, but only abnormal results are displayed) Labs Reviewed  COMPREHENSIVE METABOLIC PANEL - Abnormal; Notable for the following:       Result Value   Potassium 3.4 (*)    CO2 21 (*)    Glucose, Bld 328 (*)    Creatinine, Ser 1.08 (*)    GFR calc non Af Amer 52 (*)    All other components within normal limits  URINALYSIS, ROUTINE W REFLEX MICROSCOPIC - Abnormal; Notable for the following:    Color, Urine STRAW (*)    Specific Gravity, Urine 1.000 (*)    Glucose, UA >=500 (*)    All other components within normal limits  TROPONIN I  CBC WITH DIFFERENTIAL/PLATELET    EKG  EKG Interpretation  Date/Time:  Friday April 09 2017 16:04:38 EDT Ventricular Rate:  77 PR Interval:    QRS Duration: 100 QT Interval:  398 QTC Calculation: 451 R Axis:   65 Text Interpretation:  Sinus rhythm Borderline T wave abnormalities No significant change since last tracing Confirmed by Jacalyn Lefevre [phone]) on 04/09/2017 4:12:14 PM       Radiology Dg Chest 2 View  Result Date: 04/09/2017 CLINICAL DATA:  Dizziness. EXAM: CHEST  2 VIEW COMPARISON:  Radiograph of  January 22, 2017. FINDINGS: The heart size and mediastinal contours  are within normal limits. Both lungs are clear. No pneumothorax or pleural effusion is noted. Atherosclerosis of thoracic aorta is noted. The visualized skeletal structures are unremarkable. IMPRESSION: No active cardiopulmonary disease.  Aortic atherosclerosis. Electronically Signed   By: Lupita Raider, M.D.   On: 04/09/2017 16:45   Ct Head Wo Contrast  Result Date: 04/09/2017 CLINICAL DATA:  Dizziness since 04/07/2017 EXAM: CT HEAD WITHOUT CONTRAST TECHNIQUE: Contiguous axial images were obtained from the base of the skull through the vertex without intravenous contrast. COMPARISON:  01/22/2017 FINDINGS: Brain: No evidence for acute hemorrhage, hydrocephalus, or abnormal extra-axial fluid collection. Old right PCA infarct again noted. 18 mm hypoattenuating focus identified in the left frontal hemispheric white matter, new in the interval. Lacunar infarct identified in the anterior corpus callosum with scattered periventricular deep white matter ischemic changes noted bilaterally. Vascular: No hyperdense vessel or unexpected calcification. Skull: No evidence for fracture. No worrisome lytic or sclerotic lesion. Sinuses/Orbits: The visualized paranasal sinuses and mastoid air cells are clear. Visualized portions of the globes and intraorbital fat are unremarkable. Other: None. IMPRESSION: 1. Interval development of an 18 mm hypoattenuating lesion posterior left frontal white matter. This may be an area of subacute to chronic ischemia. Edema from underlying mass lesion considered less likely. MRI without and with contrast recommended to further evaluate. 2. 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 Electronically Signed   By: Kennith Center M.D.   On: 04/09/2017 17:13    Procedures Procedures (including critical care time)  Medications Ordered in ED Medications  sodium chloride 0.9 % bolus 1,000 mL (not administered)  aspirin  tablet 325 mg (not administered)  LORazepam (ATIVAN) injection 1 mg (not administered)  sodium chloride 0.9 % bolus 1,000 mL (1,000 mLs Intravenous New Bag/Given 04/09/17 1610)  ondansetron (ZOFRAN) injection 4 mg (4 mg Intravenous Given 04/09/17 1610)  meclizine (ANTIVERT) tablet 25 mg (25 mg Oral Given 04/09/17 1610)     Initial Impression / Assessment and Plan / ED Course  I have reviewed the triage vital signs and the nursing notes.  Pertinent labs & imaging results that were available during my care of the patient were reviewed by me and considered in my medical decision making (see chart for details).    Pt is still dizzy and is unable to walk.  Pt d/w Dr. Sharl Ma ([facility]) for admission.  Final Clinical Impressions(s) / ED Diagnoses   Final diagnoses:  Dizziness  Transient cerebral ischemia, unspecified type  Poorly controlled type 2 diabetes mellitus (HCC)    New Prescriptions New Prescriptions   No medications on file     Jacalyn Lefevre, MD 04/09/17 1931

## 2017-04-09 NOTE — ED Triage Notes (Signed)
Pt c/o dizziness, blurry vision, confusion x several days. Pt reports the symptoms have gradually come on but have been worse today. Denies unilateral weakness. Denies pain. Hx of stroke with similar symptoms.

## 2017-04-10 ENCOUNTER — Observation Stay (HOSPITAL_COMMUNITY): Payer: Medicare HMO

## 2017-04-10 DIAGNOSIS — M1990 Unspecified osteoarthritis, unspecified site: Secondary | ICD-10-CM | POA: Diagnosis present

## 2017-04-10 DIAGNOSIS — Z9049 Acquired absence of other specified parts of digestive tract: Secondary | ICD-10-CM | POA: Diagnosis not present

## 2017-04-10 DIAGNOSIS — Z7902 Long term (current) use of antithrombotics/antiplatelets: Secondary | ICD-10-CM | POA: Diagnosis not present

## 2017-04-10 DIAGNOSIS — Z888 Allergy status to other drugs, medicaments and biological substances status: Secondary | ICD-10-CM | POA: Diagnosis not present

## 2017-04-10 DIAGNOSIS — E032 Hypothyroidism due to medicaments and other exogenous substances: Secondary | ICD-10-CM | POA: Diagnosis not present

## 2017-04-10 DIAGNOSIS — I10 Essential (primary) hypertension: Secondary | ICD-10-CM | POA: Diagnosis not present

## 2017-04-10 DIAGNOSIS — F419 Anxiety disorder, unspecified: Secondary | ICD-10-CM | POA: Diagnosis present

## 2017-04-10 DIAGNOSIS — H53461 Homonymous bilateral field defects, right side: Secondary | ICD-10-CM | POA: Diagnosis present

## 2017-04-10 DIAGNOSIS — R42 Dizziness and giddiness: Secondary | ICD-10-CM | POA: Diagnosis present

## 2017-04-10 DIAGNOSIS — Z66 Do not resuscitate: Secondary | ICD-10-CM | POA: Diagnosis present

## 2017-04-10 DIAGNOSIS — H53462 Homonymous bilateral field defects, left side: Secondary | ICD-10-CM | POA: Diagnosis present

## 2017-04-10 DIAGNOSIS — Z833 Family history of diabetes mellitus: Secondary | ICD-10-CM | POA: Diagnosis not present

## 2017-04-10 DIAGNOSIS — E785 Hyperlipidemia, unspecified: Secondary | ICD-10-CM | POA: Diagnosis present

## 2017-04-10 DIAGNOSIS — I639 Cerebral infarction, unspecified: Secondary | ICD-10-CM

## 2017-04-10 DIAGNOSIS — E1165 Type 2 diabetes mellitus with hyperglycemia: Secondary | ICD-10-CM

## 2017-04-10 DIAGNOSIS — I69398 Other sequelae of cerebral infarction: Secondary | ICD-10-CM | POA: Diagnosis not present

## 2017-04-10 DIAGNOSIS — Z8249 Family history of ischemic heart disease and other diseases of the circulatory system: Secondary | ICD-10-CM | POA: Diagnosis not present

## 2017-04-10 DIAGNOSIS — I252 Old myocardial infarction: Secondary | ICD-10-CM | POA: Diagnosis not present

## 2017-04-10 DIAGNOSIS — I6302 Cerebral infarction due to thrombosis of basilar artery: Secondary | ICD-10-CM | POA: Diagnosis not present

## 2017-04-10 DIAGNOSIS — Z882 Allergy status to sulfonamides status: Secondary | ICD-10-CM | POA: Diagnosis not present

## 2017-04-10 DIAGNOSIS — Z955 Presence of coronary angioplasty implant and graft: Secondary | ICD-10-CM | POA: Diagnosis not present

## 2017-04-10 DIAGNOSIS — I69354 Hemiplegia and hemiparesis following cerebral infarction affecting left non-dominant side: Secondary | ICD-10-CM | POA: Diagnosis not present

## 2017-04-10 DIAGNOSIS — Z7982 Long term (current) use of aspirin: Secondary | ICD-10-CM | POA: Diagnosis not present

## 2017-04-10 DIAGNOSIS — I63312 Cerebral infarction due to thrombosis of left middle cerebral artery: Secondary | ICD-10-CM

## 2017-04-10 DIAGNOSIS — I63412 Cerebral infarction due to embolism of left middle cerebral artery: Secondary | ICD-10-CM | POA: Diagnosis not present

## 2017-04-10 DIAGNOSIS — M797 Fibromyalgia: Secondary | ICD-10-CM | POA: Diagnosis present

## 2017-04-10 DIAGNOSIS — E039 Hypothyroidism, unspecified: Secondary | ICD-10-CM | POA: Diagnosis present

## 2017-04-10 DIAGNOSIS — I2510 Atherosclerotic heart disease of native coronary artery without angina pectoris: Secondary | ICD-10-CM | POA: Diagnosis present

## 2017-04-10 DIAGNOSIS — Z823 Family history of stroke: Secondary | ICD-10-CM | POA: Diagnosis not present

## 2017-04-10 DIAGNOSIS — I517 Cardiomegaly: Secondary | ICD-10-CM | POA: Diagnosis not present

## 2017-04-10 DIAGNOSIS — Z809 Family history of malignant neoplasm, unspecified: Secondary | ICD-10-CM | POA: Diagnosis not present

## 2017-04-10 DIAGNOSIS — I63512 Cerebral infarction due to unspecified occlusion or stenosis of left middle cerebral artery: Secondary | ICD-10-CM | POA: Diagnosis present

## 2017-04-10 LAB — GLUCOSE, CAPILLARY
GLUCOSE-CAPILLARY: 143 mg/dL — AB (ref 65–99)
Glucose-Capillary: 107 mg/dL — ABNORMAL HIGH (ref 65–99)
Glucose-Capillary: 207 mg/dL — ABNORMAL HIGH (ref 65–99)
Glucose-Capillary: 170 mg/dL — ABNORMAL HIGH (ref 65–99)

## 2017-04-10 LAB — COMPREHENSIVE METABOLIC PANEL
ALBUMIN: 3.1 g/dL — AB (ref 3.5–5.0)
ALK PHOS: 51 U/L (ref 38–126)
ALT: 19 U/L (ref 14–54)
AST: 14 U/L — AB (ref 15–41)
Anion gap: 4 — ABNORMAL LOW (ref 5–15)
BILIRUBIN TOTAL: 0.5 mg/dL (ref 0.3–1.2)
BUN: 10 mg/dL (ref 6–20)
CALCIUM: 8.4 mg/dL — AB (ref 8.9–10.3)
CO2: 25 mmol/L (ref 22–32)
CREATININE: 0.93 mg/dL (ref 0.44–1.00)
Chloride: 113 mmol/L — ABNORMAL HIGH (ref 101–111)
GFR calc Af Amer: >60 mL/min (ref >60)
GFR calc non Af Amer: >60 mL/min (ref >60)
Glucose, Bld: 119 mg/dL — ABNORMAL HIGH (ref 65–99)
Potassium: 3.8 mmol/L (ref 3.5–5.1)
Sodium: 142 mmol/L (ref 135–145)
TOTAL PROTEIN: 5.4 g/dL — AB (ref 6.5–8.1)

## 2017-04-10 LAB — CBC
HEMATOCRIT: 34.4 % — AB (ref 36.0–46.0)
HEMOGLOBIN: 11.8 g/dL — AB (ref 12.0–15.0)
MCH: 28.4 pg (ref 26.0–34.0)
MCHC: 34.3 g/dL (ref 30.0–36.0)
MCV: 82.7 fL (ref 78.0–100.0)
Platelets: 167 10*3/uL (ref 150–400)
RBC: 4.16 MIL/uL (ref 3.87–5.11)
RDW: 13.2 % (ref 11.5–15.5)
WBC: 6.9 10*3/uL (ref 4.0–10.5)

## 2017-04-10 MED ORDER — LOPERAMIDE HCL 2 MG PO CAPS
2.0000 mg | ORAL_CAPSULE | ORAL | Status: DC | PRN
  Administered 2017-04-10 – 2017-04-13 (×3): 2 mg via ORAL
  Filled 2017-04-10 (×3): qty 1

## 2017-04-10 MED ORDER — DOXEPIN HCL 10 MG PO CAPS
ORAL_CAPSULE | ORAL | Status: AC
  Filled 2017-04-10: qty 1

## 2017-04-10 MED ORDER — VITAMIN C 500 MG PO TABS
250.0000 mg | ORAL_TABLET | Freq: Every day | ORAL | Status: DC
  Administered 2017-04-10 – 2017-04-13 (×3): 250 mg via ORAL
  Filled 2017-04-10 (×3): qty 1

## 2017-04-10 NOTE — Progress Notes (Signed)
PROGRESS NOTE    Shannon [name]  ZOX:[id number] DOB: 1950/11/24 DOA: 04/09/2017 PCP: Nila Nephew, MD     Brief Narrative:  66 y/o woman admitted from home on 6/1 due to dizziness and right-sided weakness. Found to have multiple acute infarctions of the left MCA territory. Admission requested.   Assessment & Plan:   Active Problems:   Hypertension, essential   Weakness   Hypothyroidism   Visual disturbance   Dizziness   CVA (cerebrovascular accident) (HCC)   Acute Left MCA CVA -On ASA and plavix. -Had complete work up in 3/18 at [facility] when she was admitted for stroke-like symptoms: MRI of the brain no acute findings carotid Doppler negative significant stenosis 2Decho 60-65.% LDL 105 hemoglobin A1c 8.6 Her Lipitor was increased to 80 mg daily. -Discussed case with neurology, Dr. Otelia Limes. He recommends TEE and 30 day event monitor. -Will place cardiology consultation for TEE on Monday when available. -PT/OT/ST consultations pending.  Hypothyroidism -Check TSH. -Continue home dose of synthroid.  DM -Fair control. -Monitor and adjust as needed.  HTN -Fair control. -Allow some permissive HTN given acute CVA. -Do not anticipate further medications changes while in the hospital.  CAD -Stable, no CP. -Continue ASA, plavix, nitro   DVT prophylaxis: lovenox Code Status: DNR Family Communication: sone at bedside updated on plan of care and all questions answered Disposition Plan: To be determined pending PT evaluations. Son has concerns about patient living alone.  Consultants:   None  Procedures:   None  Antimicrobials:  Anti-infectives    None       Subjective: Feels anxious, becomes tearful very easily, has no current complaints.  Objective: Vitals:   04/09/17 2049 04/09/17 2201 04/10/17 0623 04/10/17 1437  BP: 121/69 (!) 119/58 128/66 (!) 147/71  Pulse: 81 70 68 93  Resp: 18 20 20 20   Temp:  98 F (36.7 C) 98.2 F (36.8 C) 97.9  F (36.6 C)  TempSrc:  Oral Oral Oral  SpO2: 96% 99% 95% 98%  Weight:  67.4 kg (148 lb 11.2 oz)    Height:  5\' 4"  (1.626 m)      Intake/Output Summary (Last 24 hours) at 04/10/17 1634 Last data filed at 04/10/17 1257  Gross per 24 hour  Intake              395 ml  Output              900 ml  Net             -505 ml   Filed Weights   04/09/17 1601 04/09/17 2201  Weight: 72.6 kg (160 lb) 67.4 kg (148 lb 11.2 oz)    Examination:  General exam: Alert, awake, oriented x 3 Respiratory system: Clear to auscultation. Respiratory effort normal. Cardiovascular system:RRR. No murmurs, rubs, gallops. Gastrointestinal system: Abdomen is nondistended, soft and nontender. No organomegaly or masses felt. Normal bowel sounds heard. Central nervous system: Alert and oriented.  Extremities: No C/C/E, +pedal pulses Skin: No rashes, lesions or ulcers Psychiatry: Very tearful    Data Reviewed: I have personally reviewed following labs and imaging studies  CBC:  Recent Labs Lab 04/09/17 1600 04/10/17 0534  WBC 8.5 6.9  NEUTROABS 5.9  --   HGB 13.6 11.8*  HCT 39.3 34.4*  MCV 81.7 82.7  PLT 198 167   Basic Metabolic Panel:  Recent Labs Lab 04/09/17 1600 04/10/17 0534  NA 135 142  K 3.4* 3.8  CL 102 113*  CO2 21* 25  GLUCOSE 328* 119*  BUN 15 10  CREATININE 1.08* 0.93  CALCIUM 9.6 8.4*   GFR: Estimated Creatinine Clearance: 55.4 mL/min (by C-G formula based on SCr of 0.93 mg/dL). Liver Function Tests:  Recent Labs Lab 04/09/17 1600 04/10/17 0534  AST 30 14*  ALT 28 19  ALKPHOS 69 51  BILITOT 0.6 0.5  PROT 7.0 5.4*  ALBUMIN 4.0 3.1*   No results for input(s): LIPASE, AMYLASE in the last 168 hours. No results for input(s): AMMONIA in the last 168 hours. Coagulation Profile: No results for input(s): INR, PROTIME in the last 168 hours. Cardiac Enzymes:  Recent Labs Lab 04/09/17 1600  TROPONINI <0.03   BNP (last 3 results) No results for input(s): PROBNP in  the last 8760 hours. HbA1C: No results for input(s): HGBA1C in the last 72 hours. CBG:  Recent Labs Lab 04/10/17 0802 04/10/17 1141 04/10/17 1609  GLUCAP 107* 207* 143*   Lipid Profile: No results for input(s): CHOL, HDL, LDLCALC, TRIG, CHOLHDL, LDLDIRECT in the last 72 hours. Thyroid Function Tests: No results for input(s): TSH, T4TOTAL, FREET4, T3FREE, THYROIDAB in the last 72 hours. Anemia Panel: No results for input(s): VITAMINB12, FOLATE, FERRITIN, TIBC, IRON, RETICCTPCT in the last 72 hours. Urine analysis:    Component Value Date/Time   COLORURINE STRAW (A) 04/09/2017 1800   APPEARANCEUR CLEAR 04/09/2017 1800   LABSPEC 1.000 (L) 04/09/2017 1800   PHURINE 5.0 04/09/2017 1800   GLUCOSEU >=500 (A) 04/09/2017 1800   HGBUR NEGATIVE 04/09/2017 1800   BILIRUBINUR NEGATIVE 04/09/2017 1800   BILIRUBINUR neg 04/13/2014 1733   KETONESUR NEGATIVE 04/09/2017 1800   PROTEINUR NEGATIVE 04/09/2017 1800   UROBILINOGEN 0.2 04/13/2014 1733   NITRITE NEGATIVE 04/09/2017 1800   LEUKOCYTESUR NEGATIVE 04/09/2017 1800   Sepsis Labs: @LABRCNTIP (procalcitonin:4,lacticidven:4)  )No results found for this or any previous visit (from the past 240 hour(s)).       Radiology Studies: Dg Chest 2 View  Result Date: 04/09/2017 CLINICAL DATA:  Dizziness. EXAM: CHEST  2 VIEW COMPARISON:  Radiograph of January 22, 2017. FINDINGS: The heart size and mediastinal contours are within normal limits. Both lungs are clear. No pneumothorax or pleural effusion is noted. Atherosclerosis of thoracic aorta is noted. The visualized skeletal structures are unremarkable. IMPRESSION: No active cardiopulmonary disease.  Aortic atherosclerosis. Electronically Signed   By: Lupita RaiderJames  Green Jr, M.D.   On: 04/09/2017 16:45   Ct Head Wo Contrast  Result Date: 04/09/2017 CLINICAL DATA:  Dizziness since 04/07/2017 EXAM: CT HEAD WITHOUT CONTRAST TECHNIQUE: Contiguous axial images were obtained from the base of the skull through  the vertex without intravenous contrast. COMPARISON:  01/22/2017 FINDINGS: Brain: No evidence for acute hemorrhage, hydrocephalus, or abnormal extra-axial fluid collection. Old right PCA infarct again noted. 18 mm hypoattenuating focus identified in the left frontal hemispheric white matter, new in the interval. Lacunar infarct identified in the anterior corpus callosum with scattered periventricular deep white matter ischemic changes noted bilaterally. Vascular: No hyperdense vessel or unexpected calcification. Skull: No evidence for fracture. No worrisome lytic or sclerotic lesion. Sinuses/Orbits: The visualized paranasal sinuses and mastoid air cells are clear. Visualized portions of the globes and intraorbital fat are unremarkable. Other: None. IMPRESSION: 1. Interval development of an 18 mm hypoattenuating lesion posterior left frontal white matter. This may be an area of subacute to chronic ischemia. Edema from underlying mass lesion considered less likely. MRI without and with contrast recommended to further evaluate. 2. Otherwise stable exam with old right PCA  territory infarct and chronic small vessel white matter ischemic demyelination. Electronically Signed   By: Kennith Center M.D.   On: 04/09/2017 17:13   Mr Brain Wo Contrast  Result Date: 04/10/2017 CLINICAL DATA:  Dizziness and speech disturbance.  Abnormal head CT. EXAM: MRI HEAD WITHOUT CONTRAST TECHNIQUE: Multiplanar, multiecho pulse sequences of the brain and surrounding structures were obtained without intravenous contrast. COMPARISON:  CT 04/09/2017.  MRI 01/23/2017. FINDINGS: Brain: Multiple (approximately 20) foci of acute infarction in the left hemisphere within the middle cerebral artery territory, the largest being a 1.5 cm region visible on CT in the left frontoparietal white matter. Findings likely represent micro embolic infarctions. No other vascular territory acute finding. Brainstem and cerebellum are normal. Old infarction in the  right PCA territory as seen previously. No mass lesion, hemorrhage, hydrocephalus or extra-axial collection. Vascular: Major vessels at the base of the brain show flow. Skull and upper cervical spine: Negative Sinuses/Orbits: Clear/normal Other: None IMPRESSION: Multiple foci of acute infarction in the left MCA territory consistent with micro embolic infarctions. No significant swelling, mass effect or hemorrhage. Old infarction in the right PCA territory. Electronically Signed   By: Paulina Fusi M.D.   On: 04/10/2017 10:09        Scheduled Meds: . aspirin EC  81 mg Oral Daily  . atorvastatin  80 mg Oral QPM  . cholecalciferol  1,000 Units Oral Daily  . clopidogrel  75 mg Oral Daily  . doxepin  10 mg Oral QHS  . enoxaparin (LOVENOX) injection  40 mg Subcutaneous Q24H  . feeding supplement (ENSURE ENLIVE)  237 mL Oral BID BM  . gabapentin  400 mg Oral BID  . insulin aspart  0-9 Units Subcutaneous TID WC  . isosorbide mononitrate  30 mg Oral QHS  . levothyroxine  25 mcg Oral QAC breakfast  . temazepam  30 mg Oral QHS  . vitamin C  250 mg Oral Daily   Continuous Infusions: . sodium chloride 75 mL/hr at 04/10/17 0435     LOS: 0 days    Time spent: 25 minutes. Greater than 50% of this time was spent in direct contact with the patient coordinating care.     Chaya Jan, MD [facility] Pager [phone]  If 7PM-7AM, please contact night-coverage [email or web address] Password [facility] 04/10/2017, 4:34 PM

## 2017-04-10 NOTE — Progress Notes (Signed)
Initial Nutrition Assessment  DOCUMENTATION CODES:  Not applicable  INTERVENTION:  Continue Ensure Enlive po BID, each supplement provides 350 kcal and 20 grams of protein  NUTRITION DIAGNOSIS:  Inadequate oral intake related to poor appetite/ anxiety(?) as evidenced by  Loss of 5.7% bw in 2.5 months.  GOAL:  Patient will meet greater than or equal to 90% of their needs  MONITOR:  PO intake, Supplement acceptance, Labs, Weight trends, I & O's  REASON FOR ASSESSMENT:  Malnutrition Screening Tool    ASSESSMENT:  66 y/o female PMHx  Anxiety, CAD, HTN, CVA, DM, Depression, IBS. Presents with 3-4 day history of dizziness and blurred vision. Admitted for workup.   RD operating remotely. Pt screened on MST for poor intake due to decrease appetite and unintentional wt loss.   There is no direct mention of patients recent eating/nutrition history, however, Per chart, pt is very anxious. SW is being consulted regarding crisis counseling. Anxiety could be affecting appetite/wt.   Per chart, UBW appears to be 150-160 lbs. Pt [facility] at 158 lbs [facility] This is not a clinically significant wt loss for the period. Wt from 3/23 was outpatient visit and likely had shoes, items in pockets etc.   Per chart, no PO intake at this time. Will add ensure BID to supplement PO intake.   Physical Assessment: Unable to conduct  Labs: BG 107-207, Albumin: 3.1,  Medications: Ensure ENlive, vitamin D, insulin, Vitamin C, ivf  Diet Order:  Diet Carb Modified Fluid consistency: Thin; Room service appropriate? Yes  Skin:  Reviewed, no issues  Last BM:  6/2  Height:  Ht Readings from Last 1 Encounters:  04/09/17 5\' 4"  (1.626 m)   Weight:  Wt Readings from Last 1 Encounters:  04/09/17 148 lb 11.2 oz (67.4 kg)   Wt Readings from Last 10 Encounters:  04/09/17 148 lb 11.2 oz (67.4 kg)  01/29/17 160 lb 3.2 oz (72.7 kg)  10/06/16 154 lb (69.9 kg)  10/02/16 154 lb (69.9  kg)  08/10/16 154 lb (69.9 kg)  11/15/15 160 lb 12.8 oz (72.9 kg)  09/30/15 157 lb 8 oz (71.4 kg)  06/10/15 152 lb (68.9 kg)  06/05/15 151 lb 9.6 oz (68.8 kg)  03/29/15 157 lb (71.2 kg)   Ideal Body Weight:  54.54 kg  BMI:  Body mass index is 25.52 kg/m.  Estimated Nutritional Needs:  Kcal:  1600-1800 (24-27 kcal/kg bw) Protein:  65-75g Pro (1.2-1.4 g/kg bw) Fluid:  1.6-1.8 L fluid ( 641ml/kcal)  EDUCATION NEEDS:  No education needs identified at this time  Christophe LouisNathan Franks RD, LDN, CNSC Clinical Nutrition Pager: [phone] 04/10/2017 2:09 PM

## 2017-04-11 DIAGNOSIS — I6302 Cerebral infarction due to thrombosis of basilar artery: Secondary | ICD-10-CM

## 2017-04-11 LAB — BASIC METABOLIC PANEL
ANION GAP: 5 (ref 5–15)
BUN: 8 mg/dL (ref 6–20)
CALCIUM: 8.5 mg/dL — AB (ref 8.9–10.3)
CO2: 26 mmol/L (ref 22–32)
CREATININE: 0.88 mg/dL (ref 0.44–1.00)
Chloride: 108 mmol/L (ref 101–111)
GFR calc non Af Amer: >60 mL/min (ref >60)
Glucose, Bld: 137 mg/dL — ABNORMAL HIGH (ref 65–99)
Potassium: 4 mmol/L (ref 3.5–5.1)
SODIUM: 139 mmol/L (ref 135–145)

## 2017-04-11 LAB — CBC
HCT: 36.2 % (ref 36.0–46.0)
HEMOGLOBIN: 12.2 g/dL (ref 12.0–15.0)
MCH: 27.9 pg (ref 26.0–34.0)
MCHC: 33.7 g/dL (ref 30.0–36.0)
MCV: 82.8 fL (ref 78.0–100.0)
PLATELETS: 160 10*3/uL (ref 150–400)
RBC: 4.37 MIL/uL (ref 3.87–5.11)
RDW: 13.1 % (ref 11.5–15.5)
WBC: 6.9 10*3/uL (ref 4.0–10.5)

## 2017-04-11 LAB — GLUCOSE, CAPILLARY
GLUCOSE-CAPILLARY: 116 mg/dL — AB (ref 65–99)
GLUCOSE-CAPILLARY: 124 mg/dL — AB (ref 65–99)
Glucose-Capillary: 187 mg/dL — ABNORMAL HIGH (ref 65–99)
Glucose-Capillary: 244 mg/dL — ABNORMAL HIGH (ref 65–99)

## 2017-04-11 MED ORDER — GLUCERNA SHAKE PO LIQD
237.0000 mL | Freq: Three times a day (TID) | ORAL | Status: DC
  Administered 2017-04-11: 237 mL via ORAL

## 2017-04-11 MED ORDER — ACETAMINOPHEN 325 MG PO TABS
650.0000 mg | ORAL_TABLET | Freq: Four times a day (QID) | ORAL | Status: DC | PRN
  Administered 2017-04-11 – 2017-04-12 (×2): 650 mg via ORAL
  Filled 2017-04-11 (×2): qty 2

## 2017-04-11 NOTE — Progress Notes (Signed)
PT Cancellation Note  Patient Details Name: Shannon [name] MRN: [id number] DOB: 09/18/1951   Cancelled Treatment:    Attempted to evaluate patient twice today. On first attempt patient sitting at EOB, very anxious and complaining of stomach pain and full body shaking that "I just can't stop", asking if RN can give her anything to help. DPT spoke to RN regarding patient presentation/situation.   Made second attempt at skilled evaluation later in the morning; patient now in bed after having received medicine from RN but still very anxious and not willing to participate with skilled PT evaluation this morning. General confusion noted.  At this point patient will be priority for evaluation on 6/4.    Nedra HaiKristen Ashaunti Treptow PT, DPT [phone]

## 2017-04-11 NOTE — Progress Notes (Signed)
PROGRESS NOTE    Shannon [name] Shannon [name]  ZOX:[id number] DOB: 11/08/1951 DOA: 04/09/2017 PCP: Nila NephewGreen, Edwin, MD     Brief Narrative:  66 y/o woman admitted from home on 6/1 due to dizziness and right-sided weakness. Found to have multiple acute infarctions of the left MCA territory. Admission requested.   Assessment & Plan:   Active Problems:   Hypertension, essential   Weakness   Hypothyroidism   Visual disturbance   Dizziness   CVA (cerebrovascular accident) (HCC)   Acute Left MCA CVA -On ASA and plavix. -Had complete work up [facility] 3/[facility] when she was admitted for stroke-like symptoms: MRI of the brain no acute findings carotid Doppler negative significant stenosis 2Decho 60-65.% LDL 105 hemoglobin A1c 8.6 Her Lipitor was increased to 80 mg daily. -Discussed case with neurology on 6/2, Dr. Otelia LimesLindzen. He recommends TEE and 30 day event monitor. -Will place cardiology consultation for TEE on Monday when available. NPO after midnight. -PT/OT/ST consultations pending.  Hypothyroidism -Check TSH. -Continue home dose of synthroid.  DM -Well controlled. -Monitor and adjust as needed.  HTN -Fair control. -Allow some permissive HTN given acute CVA. -Do not anticipate further medications changes while in the hospital.  CAD -Stable, no CP. -Continue ASA, plavix, nitro   DVT prophylaxis: lovenox Code Status: DNR Family Communication: patient only Disposition Plan: To be determined pending PT evaluations. Son has concerns about patient living alone.  Consultants:   None  Procedures:   None  Antimicrobials:  Anti-infectives    None       Subjective: Still very anxious. She understands current plan of care and asks appropriate questions regarding possibility of TEE in am.  Objective: Vitals:   04/10/17 1437 04/10/17 2206 04/11/17 0558 04/11/17 0600  BP: (!) 147/71 (!) 148/71 (!) 115/58   Pulse: 93 95 80   Resp: 20 20 18    Temp: 97.9 F (36.6 C)  98.5 F (36.9 C) 98.5 F (36.9 C)   TempSrc: Oral Oral Oral   SpO2: 98% 98% 94%   Weight:    69.6 kg (153 lb 7 oz)  Height:        Intake/Output Summary (Last 24 hours) at 04/11/17 1357 Last data filed at 04/11/17 1111  Gross per 24 hour  Intake              480 ml  Output             1350 ml  Net             -870 ml   Filed Weights   04/09/17 1601 04/09/17 2201 04/11/17 0600  Weight: 72.6 kg (160 lb) 67.4 kg (148 lb 11.2 oz) 69.6 kg (153 lb 7 oz)    Examination:  General exam: Alert, awake, oriented x 3 Respiratory system: Clear to auscultation. Respiratory effort normal. Cardiovascular system:RRR. No murmurs, rubs, gallops. Gastrointestinal system: Abdomen is nondistended, soft and nontender. No organomegaly or masses felt. Normal bowel sounds heard. Central nervous system: Alert and oriented.  Extremities: No C/C/E, +pedal pulses Skin: No rashes, lesions or ulcers Psychiatry: Anxious, tearful     Data Reviewed: I have personally reviewed following labs and imaging studies  CBC:  Recent Labs Lab 04/09/17 1600 04/10/17 0534 04/11/17 0453  WBC 8.5 6.9 6.9  NEUTROABS 5.9  --   --   HGB 13.6 11.8* 12.2  HCT 39.3 34.4* 36.2  MCV 81.7 82.7 82.8  PLT 198 167 160   Basic Metabolic Panel:  Recent Labs Lab 04/09/17 1600 04/10/17 0534 04/11/17 0453  NA 135 142 139  K 3.4* 3.8 4.0  CL 102 113* 108  CO2 21* 25 26  GLUCOSE 328* 119* 137*  BUN 15 10 8   CREATININE 1.08* 0.93 0.88  CALCIUM 9.6 8.4* 8.5*   GFR: Estimated Creatinine Clearance: 59.4 mL/min (by C-G formula based on SCr of 0.88 mg/dL). Liver Function Tests:  Recent Labs Lab 04/09/17 1600 04/10/17 0534  AST 30 14*  ALT 28 19  ALKPHOS 69 51  BILITOT 0.6 0.5  PROT 7.0 5.4*  ALBUMIN 4.0 3.1*   No results for input(s): LIPASE, AMYLASE in the last 168 hours. No results for input(s): AMMONIA in the last 168 hours. Coagulation Profile: No results for input(s): INR, PROTIME in the last 168  hours. Cardiac Enzymes:  Recent Labs Lab 04/09/17 1600  TROPONINI <0.03   BNP (last 3 results) No results for input(s): PROBNP in the last 8760 hours. HbA1C: No results for input(s): HGBA1C in the last 72 hours. CBG:  Recent Labs Lab 04/10/17 1141 04/10/17 1609 04/10/17 2128 04/11/17 0749 04/11/17 1122  GLUCAP 207* 143* 170* 116* 124*   Lipid Profile: No results for input(s): CHOL, HDL, LDLCALC, TRIG, CHOLHDL, LDLDIRECT in the last 72 hours. Thyroid Function Tests: No results for input(s): TSH, T4TOTAL, FREET4, T3FREE, THYROIDAB in the last 72 hours. Anemia Panel: No results for input(s): VITAMINB12, FOLATE, FERRITIN, TIBC, IRON, RETICCTPCT in the last 72 hours. Urine analysis:    Component Value Date/Time   COLORURINE STRAW (A) 04/09/2017 1800   APPEARANCEUR CLEAR 04/09/2017 1800   LABSPEC 1.000 (L) 04/09/2017 1800   PHURINE 5.0 04/09/2017 1800   GLUCOSEU >=500 (A) 04/09/2017 1800   HGBUR NEGATIVE 04/09/2017 1800   BILIRUBINUR NEGATIVE 04/09/2017 1800   BILIRUBINUR neg 04/13/2014 1733   KETONESUR NEGATIVE 04/09/2017 1800   PROTEINUR NEGATIVE 04/09/2017 1800   UROBILINOGEN 0.2 04/13/2014 1733   NITRITE NEGATIVE 04/09/2017 1800   LEUKOCYTESUR NEGATIVE 04/09/2017 1800   Sepsis Labs: @LABRCNTIP (procalcitonin:4,lacticidven:4)  )No results found for this or any previous visit (from the past 240 hour(s)).       Radiology Studies: Dg Chest 2 View  Result Date: 04/09/2017 CLINICAL DATA:  Dizziness. EXAM: CHEST  2 VIEW COMPARISON:  Radiograph of January 22, 2017. FINDINGS: The heart size and mediastinal contours are within normal limits. Both lungs are clear. No pneumothorax or pleural effusion is noted. Atherosclerosis of thoracic aorta is noted. The visualized skeletal structures are unremarkable. IMPRESSION: No active cardiopulmonary disease.  Aortic atherosclerosis. Electronically Signed   By: Lupita Raider, M.D.   On: 04/09/2017 16:45   Ct Head Wo  Contrast  Result Date: 04/09/2017 CLINICAL DATA:  Dizziness since 04/07/2017 EXAM: CT HEAD WITHOUT CONTRAST TECHNIQUE: Contiguous axial images were obtained from the base of the skull through the vertex without intravenous contrast. COMPARISON:  01/22/2017 FINDINGS: Brain: No evidence for acute hemorrhage, hydrocephalus, or abnormal extra-axial fluid collection. Old right PCA infarct again noted. 18 mm hypoattenuating focus identified in the left frontal hemispheric white matter, new in the interval. Lacunar infarct identified in the anterior corpus callosum with scattered periventricular deep white matter ischemic changes noted bilaterally. Vascular: No hyperdense vessel or unexpected calcification. Skull: No evidence for fracture. No worrisome lytic or sclerotic lesion. Sinuses/Orbits: The visualized paranasal sinuses and mastoid air cells are clear. Visualized portions of the globes and intraorbital fat are unremarkable. Other: None. IMPRESSION: 1. Interval development of an 18 mm hypoattenuating lesion posterior left frontal white  matter. This may be an area of subacute to chronic ischemia. Edema from underlying mass lesion considered less likely. MRI without and with contrast recommended to further evaluate. 2. 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 Electronically Signed   By: Kennith Center M.D.   On: 04/09/2017 17:13   Mr Brain Wo Contrast  Result Date: 04/10/2017 CLINICAL DATA:  Dizziness and speech disturbance.  Abnormal head CT. EXAM: MRI HEAD WITHOUT CONTRAST TECHNIQUE: Multiplanar, multiecho pulse sequences of the brain and surrounding structures were obtained without intravenous contrast. COMPARISON:  CT 04/09/2017.  MRI 01/23/2017. FINDINGS: Brain: Multiple (approximately 20) foci of acute infarction in the left hemisphere within the middle cerebral artery territory, the largest being a 1.5 cm region visible on CT in  the left frontoparietal white matter. Findings likely represent micro embolic infarctions. No other vascular territory acute finding. Brainstem and cerebellum are normal. Old infarction in the right PCA territory as seen previously. No mass lesion, hemorrhage, hydrocephalus or extra-axial collection. Vascular: Major vessels at the base of the brain show flow. Skull and upper cervical spine: Negative Sinuses/Orbits: Clear/normal Other: None IMPRESSION: Multiple foci of acute infarction in the left MCA territory consistent with micro embolic infarctions. No significant swelling, mass effect or hemorrhage. Old infarction in the right PCA territory. Electronically Signed   By: Paulina Fusi M.D.   On: 04/10/2017 10:09        Scheduled Meds: . aspirin EC  81 mg Oral Daily  . atorvastatin  80 mg Oral QPM  . cholecalciferol  1,000 Units Oral Daily  . clopidogrel  75 mg Oral Daily  . doxepin  10 mg Oral QHS  . enoxaparin (LOVENOX) injection  40 mg Subcutaneous Q24H  . feeding supplement (GLUCERNA SHAKE)  237 mL Oral TID BM  . gabapentin  400 mg Oral BID  . insulin aspart  0-9 Units Subcutaneous TID WC  . isosorbide mononitrate  30 mg Oral QHS  . levothyroxine  25 mcg Oral QAC breakfast  . temazepam  30 mg Oral QHS  . vitamin C  250 mg Oral Daily   Continuous Infusions: . sodium chloride 75 mL/hr at 04/11/17 0724     LOS: 1 day    Time spent: 25 minutes. Greater than 50% of this time was spent in direct contact with the patient coordinating care.     Chaya Jan, MD [facility] Pager [phone]  If 7PM-7AM, please contact night-coverage [email or web address] Password [email or web address] 04/11/2017, 1:57 PM

## 2017-04-12 ENCOUNTER — Inpatient Hospital Stay (HOSPITAL_COMMUNITY): Payer: Medicare HMO | Admitting: Anesthesiology

## 2017-04-12 ENCOUNTER — Inpatient Hospital Stay (HOSPITAL_COMMUNITY): Payer: Medicare HMO

## 2017-04-12 ENCOUNTER — Telehealth: Payer: Self-pay | Admitting: Physician Assistant

## 2017-04-12 ENCOUNTER — Encounter (HOSPITAL_COMMUNITY)
Admission: EM | Disposition: A | Discharge status: Home Health | Payer: Self-pay | Source: Home / Self Care | Attending: Internal Medicine

## 2017-04-12 ENCOUNTER — Encounter (HOSPITAL_COMMUNITY): Payer: Self-pay | Admitting: *Deleted

## 2017-04-12 DIAGNOSIS — I63412 Cerebral infarction due to embolism of left middle cerebral artery: Secondary | ICD-10-CM

## 2017-04-12 DIAGNOSIS — I10 Essential (primary) hypertension: Secondary | ICD-10-CM

## 2017-04-12 DIAGNOSIS — I517 Cardiomegaly: Secondary | ICD-10-CM

## 2017-04-12 HISTORY — PX: TEE WITHOUT CARDIOVERSION: SHX5443

## 2017-04-12 LAB — GLUCOSE, CAPILLARY
GLUCOSE-CAPILLARY: 147 mg/dL — AB (ref 65–99)
Glucose-Capillary: 127 mg/dL — ABNORMAL HIGH (ref 65–99)
Glucose-Capillary: 121 mg/dL — ABNORMAL HIGH (ref 65–99)
Glucose-Capillary: 130 mg/dL — ABNORMAL HIGH (ref 65–99)
Glucose-Capillary: 207 mg/dL — ABNORMAL HIGH (ref 65–99)

## 2017-04-12 LAB — MAGNESIUM: Magnesium: 1.4 mg/dL — ABNORMAL LOW (ref 1.7–2.4)

## 2017-04-12 LAB — TSH: TSH: 3.425 u[IU]/mL (ref 0.350–4.500)

## 2017-04-12 SURGERY — ECHOCARDIOGRAM, TRANSESOPHAGEAL
Anesthesia: Monitor Anesthesia Care

## 2017-04-12 MED ORDER — PHENYLEPHRINE HCL 10 MG/ML IJ SOLN
INTRAMUSCULAR | Status: AC
  Filled 2017-04-12: qty 1

## 2017-04-12 MED ORDER — PROPOFOL 500 MG/50ML IV EMUL
INTRAVENOUS | Status: DC | PRN
  Administered 2017-04-12: 125 ug/kg/min via INTRAVENOUS

## 2017-04-12 MED ORDER — SODIUM CHLORIDE 0.9 % IV SOLN
INTRAVENOUS | Status: DC
  Administered 2017-04-12: 11:00:00 via INTRAVENOUS

## 2017-04-12 MED ORDER — SODIUM CHLORIDE 0.9 % IJ SOLN
INTRAMUSCULAR | Status: AC
  Filled 2017-04-12: qty 10

## 2017-04-12 MED ORDER — ONDANSETRON HCL 4 MG/2ML IJ SOLN
INTRAMUSCULAR | Status: AC
  Filled 2017-04-12: qty 2

## 2017-04-12 MED ORDER — ROCURONIUM BROMIDE 50 MG/5ML IV SOLN
INTRAVENOUS | Status: AC
  Filled 2017-04-12: qty 1

## 2017-04-12 MED ORDER — EPHEDRINE SULFATE 50 MG/ML IJ SOLN
INTRAMUSCULAR | Status: AC
  Filled 2017-04-12: qty 1

## 2017-04-12 MED ORDER — SODIUM CHLORIDE BACTERIOSTATIC 0.9 % IJ SOLN
INTRAMUSCULAR | Status: DC | PRN
  Administered 2017-04-12: 10 mL via INTRAVENOUS

## 2017-04-12 MED ORDER — DEXAMETHASONE SODIUM PHOSPHATE 4 MG/ML IJ SOLN
INTRAMUSCULAR | Status: AC
  Filled 2017-04-12: qty 1

## 2017-04-12 MED ORDER — PROPOFOL 10 MG/ML IV BOLUS
INTRAVENOUS | Status: AC
  Filled 2017-04-12: qty 20

## 2017-04-12 MED ORDER — PROPOFOL 10 MG/ML IV BOLUS
INTRAVENOUS | Status: DC | PRN
  Administered 2017-04-12: 20 mg via INTRAVENOUS

## 2017-04-12 MED ORDER — SUCCINYLCHOLINE CHLORIDE 20 MG/ML IJ SOLN
INTRAMUSCULAR | Status: AC
  Filled 2017-04-12: qty 1

## 2017-04-12 MED ORDER — LIDOCAINE VISCOUS 2 % MT SOLN
OROMUCOSAL | Status: DC | PRN
  Administered 2017-04-12: 1 via OROMUCOSAL

## 2017-04-12 NOTE — CV Procedure (Signed)
Transesophageal echocardiogram  Indication: History of recurrent stroke, rule out intracardiac source of embolus  Description of procedure: Patient taken to the procedure suite after informed consent was obtained. Timeout performed. She was placed in the left lateral decubitus position with sedation provided by the Anesthesia service using propofol. Bite block in place. Transesophageal echocardiogram probe was inserted into the esophagus and multiple images were obtained. Please refer to the final report. In summary, there was no evidence of left or right atrial appendage thrombus, no obvious PFO or ASD by color flow Doppler or agitated saline injection. Mild aortic atherosclerosis noted. Patient tolerated the procedure well without any palpitations.  Jonelle SidleSamuel G. Henri Baumler, M.D., F.A.C.C.

## 2017-04-12 NOTE — Progress Notes (Signed)
SLP Cancellation Note  Patient Details Name: Shannon [name] MRN: [id number] DOB: 07/31/1951   Cancelled treatment:       Reason Eval/Treat Not Completed: Patient at procedure or test/unavailable; Pt off floor for TEE. SLP will check back around 3:45 PM.  Thank you,  Shannon [name], CCC-SLP [phone]    Shannon [name] 04/12/2017, 1:17 PM

## 2017-04-12 NOTE — Consult Note (Signed)
Cardiology Consultation:   Patient ID: Shannon [name]; [id number]; Jan 15, 1951   Admit date: 04/09/2017 Date of Consult: 04/12/2017  Primary Care Provider: Nila Nephew, MD Primary Cardiologist: Dr. Anne Fu   Patient Profile:   Shannon [name] is a 66 y.o. female with a hx of CAD s/p DES to LAD 2011, arthritis, anxiety, depression, fibromyalgia, right PCA stroke 2015 associated with some left-sided numbness and left visual field deficit, hypertension, diabetes mellitus, IBS, dyslipidemia whom we are asked to see for recurrent stroke at the request of Dr. Ardyth Harps.  History of Present Illness:   Last nuc 2015 was normal. She was last evaluated as inpatient by [facility] Cardiology team in 11/2014 at which time she had ruled in for NSTEMI (troponin 1.06) - cath deferred at that time given recent CVA. More recently she was recently admitted 01/2017 to [facility] with increasing LLE weakness and increasing visual problems. Workup revealed MRI with "No acute finding, Old infarction in the right PCA territory. Old watershed distribution small vessel insults of the left hemisphere." Carotid duplex negative for significant stenosis (1-39% BICA). 2D Echo 01/25/17 showed EF 55-60%, normal RV, no sig valvular abnormalities. LDL 105, hemoglobin A1c 8.6. Her Lipitor was increased to 80 mg daily. Neurology recommended to continue ASA and Plavix. There is mention of a loop recorder being scheduled but she says she never got this due to transportation.  She presented back to [facility] 04/09/2017 with dizziness and continued blurred vision. She also reported being under a lot of stress recently due to her daughter (intermittent crying spells during this admission). She reports that her children recently have been contributing to significant anxiety but does not specify why, just states that she tends to get extremely agitated due to recent situations. She denies any chest pain, SOB, palpitations. Labs showed K  3.4->4.0, Cr 0.88, normal CBC, neg troponin, UA with >500 glucose. CXR NAD, aortic atherosclerosis. CT head showed "Interval development of an 18 mm hypoattenuating lesion posterior left frontal white matter, may be an area of subacute to chronic ischemia, old right PCA infarct" -> MR brain showed multiple foci of acute infarction in the left MCA territory consistent with micro embolic infarctions. She states that if anything needs to be done, she wants Dr. Anne Fu to do it.   Past Medical History:  Diagnosis Date  . Anxiety   . Arthritis   . Coronary artery disease    a. s/p PCI with DES to LAD in 2011  (LHC 03/2010: Mid LAD 70%, normal ejection fraction  >> PCI:  2.75 x 15 mm Promus DES to LAD); b. Nuclear Stress Test 05/29/14: No ischemia, EF 59%, Normal; c. NSTEMI 11/2014 >> patient declined LHC b/c of recent CVA  . Depression   . Diabetes mellitus without complication (HCC)   . Dyslipidemia 11/20/2014  . Fibromyalgia 1995  . H/O ischemic right PCA stroke 10/2014   a. Carotid US 10/24/14: No significant ICA stenosis bilaterally  . Hemiparesis and alteration of sensations as late effects of stroke (HCC) 06/05/2015  . Hx of echocardiogram    a. Echocardiogram 10/25/14: Moderate LVH, EF 50-55%, normal wall motion, grade 2 diastolic dysfunction, mild LAE, atrial septal lipomatous hypertrophy  . Hypertension   . Irritable bowel     Past Surgical History:  Procedure Laterality Date  . CHOLECYSTECTOMY    . CORONARY STENT PLACEMENT    . orif arm fracture Right    forearm     Inpatient Medications: Scheduled Meds: . aspirin  EC  81 mg Oral Daily  . atorvastatin  80 mg Oral QPM  . cholecalciferol  1,000 Units Oral Daily  . clopidogrel  75 mg Oral Daily  . doxepin  10 mg Oral QHS  . enoxaparin (LOVENOX) injection  40 mg Subcutaneous Q24H  . feeding supplement (GLUCERNA SHAKE)  237 mL Oral TID BM  . gabapentin  400 mg Oral BID  . insulin aspart  0-9 Units Subcutaneous TID WC  . isosorbide  mononitrate  30 mg Oral QHS  . levothyroxine  25 mcg Oral QAC breakfast  . temazepam  30 mg Oral QHS  . vitamin C  250 mg Oral Daily   Continuous Infusions: . sodium chloride 75 mL/hr at 04/11/17 2156   PRN Meds: acetaminophen, ALPRAZolam, loperamide, ondansetron **OR** ondansetron (ZOFRAN) IV  Allergies:    Allergies  Allergen Reactions  . Sulfa Antibiotics Other (See Comments)    Childhood allergy, can't remember  . Lisinopril Cough    Social History:   Social History   Social History  . Marital status: Married    Spouse name: N/A  . Number of children: 4  . Years of education: N/A   Occupational History  . retirred    Social History Main Topics  . Smoking status: Never Smoker  . Smokeless tobacco: Never Used  . Alcohol use No  . Drug use: No  . Sexual activity: Not on file   Other Topics Concern  . Not on file   Social History Narrative   Patient is right handed.   Patient drinks about 1 coke daily.   Separated from husband. 03-29-15     Family History:   The patient's family history includes CAD in her brother, father, mother, and sister; Cancer in her father and mother; Diabetes in her brother, father, mother, and sister; Heart attack in her brother, father, mother, and sister; Heart failure in her father and mother; Hypertension in her brother, father, mother, and sister; Stroke in her father and maternal aunt.  ROS:  Please see the history of present illness.  All other ROS reviewed and negative.     Physical Exam/Data:   Vitals:   04/11/17 0600 04/11/17 1532 04/11/17 2200 04/12/17 0640  BP:  (!) 156/77 (!) 166/84 (!) 141/70  Pulse:  (!) 106 (!) 102 78  Resp:  18 18 18   Temp:  99.2 F (37.3 C) 97.4 F (36.3 C) 98.3 F (36.8 C)  TempSrc:  Oral Oral Oral  SpO2:  100% 99% 95%  Weight: 153 lb 7 oz (69.6 kg)     Height:        Intake/Output Summary (Last 24 hours) at 04/12/17 0938 Last data filed at 04/11/17 2237  Gross per 24 hour  Intake               360 ml  Output             2200 ml  Net            -1840 ml   Filed Weights   04/09/17 1601 04/09/17 2201 04/11/17 0600  Weight: 160 lb (72.6 kg) 148 lb 11.2 oz (67.4 kg) 153 lb 7 oz (69.6 kg)   Body mass index is 26.34 kg/m.  General: Well developed, well nourished, in no acute distress. Head: Normocephalic, atraumatic, sclera non-icteric, no xanthomas, nares are without discharges Neck: Negative for carotid bruits. JVD not elevated. Lungs: Clear bilaterally to auscultation without wheezes, rales, or rhonchi. Breathing is unlabored. Heart:  RRR with S1 S2. No murmurs, rubs, or gallops appreciated. Abdomen: Soft, non-tender, non-distended with normoactive bowel sounds. No hepatomegaly. No rebound/guarding. No obvious abdominal masses. Msk:  Strength and tone appear normal for age. Extremities: No clubbing or cyanosis. No edema.  Distal pedal pulses are 2+ and equal bilaterally. Neuro: Alert and oriented X 3. Moves all extremities spontaneously. Psych:  Responds to questions appropriately with a slightly flattened affect.  EKG:  The EKG was personally reviewed and demonstrates NSR 77bpm nonspecific inferior TWI   Relevant CV Studies: See above  Laboratory Data:  Chemistry Recent Labs Lab 04/09/17 1600 04/10/17 0534 04/11/17 0453  NA 135 142 139  K 3.4* 3.8 4.0  CL 102 113* 108  CO2 21* 25 26  GLUCOSE 328* 119* 137*  BUN 15 10 8   CREATININE 1.08* 0.93 0.88  CALCIUM 9.6 8.4* 8.5*  GFRNONAA 52* >60 >60  GFRAA >60 >60 >60  ANIONGAP 12 4* 5     Recent Labs Lab 04/09/17 1600 04/10/17 0534  PROT 7.0 5.4*  ALBUMIN 4.0 3.1*  AST 30 14*  ALT 28 19  ALKPHOS 69 51  BILITOT 0.6 0.5   Hematology Recent Labs Lab 04/09/17 1600 04/10/17 0534 04/11/17 0453  WBC 8.5 6.9 6.9  RBC 4.81 4.16 4.37  HGB 13.6 11.8* 12.2  HCT 39.3 34.4* 36.2  MCV 81.7 82.7 82.8  MCH 28.3 28.4 27.9  MCHC 34.6 34.3 33.7  RDW 13.2 13.2 13.1  PLT 198 167 160   Cardiac  Enzymes Recent Labs Lab 04/09/17 1600  TROPONINI <0.03   No results for input(s): TROPIPOC in the last 168 hours.  BNPNo results for input(s): BNP, PROBNP in the last 168 hours.  DDimer No results for input(s): DDIMER in the last 168 hours.  Radiology/Studies:  Dg Chest 2 View  Result Date: 04/09/2017 CLINICAL DATA:  Dizziness. EXAM: CHEST  2 VIEW COMPARISON:  Radiograph of January 22, 2017. FINDINGS: The heart size and mediastinal contours are within normal limits. Both lungs are clear. No pneumothorax or pleural effusion is noted. Atherosclerosis of thoracic aorta is noted. The visualized skeletal structures are unremarkable. IMPRESSION: No active cardiopulmonary disease.  Aortic atherosclerosis. Electronically Signed   By: Lupita Raider, M.D.   On: 04/09/2017 16:45   Ct Head Wo Contrast  Result Date: 04/09/2017 CLINICAL DATA:  Dizziness since 04/07/2017 EXAM: CT HEAD WITHOUT CONTRAST TECHNIQUE: Contiguous axial images were obtained from the base of the skull through the vertex without intravenous contrast. COMPARISON:  01/22/2017 FINDINGS: Brain: No evidence for acute hemorrhage, hydrocephalus, or abnormal extra-axial fluid collection. Old right PCA infarct again noted. 18 mm hypoattenuating focus identified in the left frontal hemispheric white matter, new in the interval. Lacunar infarct identified in the anterior corpus callosum with scattered periventricular deep white matter ischemic changes noted bilaterally. Vascular: No hyperdense vessel or unexpected calcification. Skull: No evidence for fracture. No worrisome lytic or sclerotic lesion. Sinuses/Orbits: The visualized paranasal sinuses and mastoid air cells are clear. Visualized portions of the globes and intraorbital fat are unremarkable. Other: None. IMPRESSION: 1. Interval development of an 18 mm hypoattenuating lesion posterior left frontal white matter. This may be an area of subacute to chronic ischemia. Edema from underlying mass  lesion considered less likely. MRI without and with contrast recommended to further evaluate. 2. 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 Electronically Signed   By: Kennith Center M.D.   On: 04/09/2017 17:13  Mr Brain Wo Contrast  Result Date: 04/10/2017 CLINICAL DATA:  Dizziness and speech disturbance.  Abnormal head CT. EXAM: MRI HEAD WITHOUT CONTRAST TECHNIQUE: Multiplanar, multiecho pulse sequences of the brain and surrounding structures were obtained without intravenous contrast. COMPARISON:  CT 04/09/2017.  MRI 01/23/2017. FINDINGS: Brain: Multiple (approximately 20) foci of acute infarction in the left hemisphere within the middle cerebral artery territory, the largest being a 1.5 cm region visible on CT in the left frontoparietal white matter. Findings likely represent micro embolic infarctions. No other vascular territory acute finding. Brainstem and cerebellum are normal. Old infarction in the right PCA territory as seen previously. No mass lesion, hemorrhage, hydrocephalus or extra-axial collection. Vascular: Major vessels at the base of the brain show flow. Skull and upper cervical spine: Negative Sinuses/Orbits: Clear/normal Other: None IMPRESSION: Multiple foci of acute infarction in the left MCA territory consistent with micro embolic infarctions. No significant swelling, mass effect or hemorrhage. Old infarction in the right PCA territory. Electronically Signed   By: Paulina FusiMark  Shogry M.D.   On: 04/10/2017 10:09    Assessment and Plan:   1. Recurrent stroke - will discuss further evaluation with Dr. Diona BrownerMcDowell. Does not appear patient has had TEE previously. Loop was considered but patient did not attend due to transportation issues. Will ask MD to review telemetry - two episodes of tachycardia - one appears to be a brief atrial tach, other is an irregular but wide complex tachycardia - both are incredibly short and <10  beats. Recurrent events concerning for embolic phenomena due to multiple prior territories and recurrent event despite DAPT. Check TSH and Mg.  2. CAD - managed medically, prior PCI 2011 and NSTEMI 2016. No recent chest pain or dyspnea per patient. Consider resumption of BB .  3. Anxiety/depression - major impact on patient recently, further per primary team.  4. Hyperlipidemia - continue statin.  5. Essential HTN - appears somewhat labile this admission. Consider resumption of home beta blocker.   Signed, Laurann Montanaayna N Mcginnity, PA-C  04/12/2017 9:38 AM   Attending note:  Patient seen and examined. Reviewed records and discussed the case with Ms. Jetta LoutDunn PA-C. I agree with her above assessment. Patient presents with recurrent strokes suggestive of embolic phenomena. She had been considered for ILR previously, but did not have this placed due to transportation difficulties. She does not have any clear evidence of atrial fibrillation by history and has been on aspirin and Plavix as an outpatient. She does have CAD with prior NSTEMI in 2016 (managed medically) and also previous DES to the LAD in 2011.  On examination she appears comfortable at rest. Systolic blood pressure 140s and in sinus rhythm by telemetry. She did have 2 brief episodes of what looks like possibly atrial tachycardia although very difficult to exclude a brief burst of atrial fibrillation, one with aberrancy. Lungs are clear without wheezing. Cardiac exam reveals RRR without gallop. Extremities exhibit no pitting edema. Lab work shows potassium 4.0, creatinine 0.88, hemoglobin 12.2. I personally reviewed the ECG from 04/09/2017 which shows sinus rhythm with nonspecific T-wave changes.  Patient with history of recurrent strokes and question of embolic phenomena. TEE has been requested to exclude intracardiac source of thrombus. She is nothing by mouth today and we will try to get this scheduled. She will also most likely need to follow-up with  ILR as prior previously recommended. Could consider transitioning to regimen such as Eliquis and aspirin based on likelihood of PAF and the brief findings on telemetry.  Satira Sark, M.D., F.A.C.C.

## 2017-04-12 NOTE — Progress Notes (Signed)
*  PRELIMINARY RESULTS* Echocardiogram Echocardiogram Transesophageal has been performed.  Jeryl Columbialliott, Mikita Lesmeister 04/12/2017, 3:28 PM

## 2017-04-12 NOTE — Progress Notes (Signed)
Pt being transported to endoscopy via endo RN.

## 2017-04-12 NOTE — Transfer of Care (Signed)
Immediate Anesthesia Transfer of Care Note  Patient: Shannon [name]  Procedure(s) Performed: Procedure(s): TRANSESOPHAGEAL ECHOCARDIOGRAM (TEE) WITH PROPOFOL (N/A)  Patient Location: PACU  Anesthesia Type:MAC  Level of Consciousness: sedated and patient cooperative  Airway & Oxygen Therapy: Patient Spontanous Breathing and Patient connected to nasal cannula oxygen  Post-op Assessment: Report given to RN and Post -op Vital signs reviewed and stable  Post vital signs: Reviewed and stable  Last Vitals:  Vitals:   04/12/17 1405 04/12/17 1410  BP:    Pulse:    Resp: 20 (!) 21  Temp:      Last Pain:  Vitals:   04/12/17 1332  TempSrc: Oral  PainSc: 6       Patients Stated Pain Goal: 7 (16/42/90 [id number])  Complications: No apparent anesthesia complications

## 2017-04-12 NOTE — Anesthesia Preprocedure Evaluation (Signed)
Anesthesia Evaluation  Patient identified by MRN, date of birth, ID band Patient awake    Reviewed: Allergy & Precautions, NPO status , Patient's Chart, lab work & pertinent test results, reviewed documented beta blocker date and time   Airway Mallampati: II  TM Distance: >3 FB Neck ROM: Full    Dental  (+) Teeth Intact, Chipped, Missing Multiple missing posterior uppers and lowers. Front are chipped Denies loose teeth:   Pulmonary    breath sounds clear to auscultation       Cardiovascular Exercise Tolerance: Poor hypertension, + CAD, + Past MI and + Peripheral Vascular Disease   Rhythm:Regular     Neuro/Psych Anxiety Depression CVA, No Residual Symptoms    GI/Hepatic   Endo/Other  diabetes, Well Controlled, Type 2Hypothyroidism   Renal/GU      Musculoskeletal  (+) Arthritis , Fibromyalgia -  Abdominal   Peds  Hematology   Anesthesia Other Findings   Reproductive/Obstetrics                             Anesthesia Physical Anesthesia Plan  ASA: III and emergent  Anesthesia Plan: MAC   Post-op Pain Management:    Induction:   Airway Management Planned: Simple Face Mask  Additional Equipment:   Intra-op Plan:   Post-operative Plan:   Informed Consent: I have reviewed the patients History and Physical, chart, labs and discussed the procedure including the risks, benefits and alternatives for the proposed anesthesia with the patient or authorized representative who has indicated his/her understanding and acceptance.   Dental advisory given  Plan Discussed with: Surgeon and Anesthesiologist  Anesthesia Plan Comments:         Anesthesia Quick Evaluation

## 2017-04-12 NOTE — Care Management Note (Signed)
Case Management Note  Patient Details  Name: Shannon [name] MRN: [id number] Date of Birth: 04/06/1951  Subjective/Objective:                  Pt admitted with dizziness, she is from home, lives with her son. She is ind with ADL's. She has PCP, and insurance with drug coverage. Pt has walker, BSC and cane at home pta. PT has recommended HH PT/OT. Pt is agreeable and has used [facility] in the past, would like them again. Pt has missed appointments in the past with cardiology due to transportation issues. No other needs communicated.   Action/Plan: [facility], Alroy BailiffLinda Lothian, [facility], aware of referral and will obtain pt info from chart. CM will provide pt with resources for [facility]. CM will follow to DC.   Expected Discharge Date:                  Expected Discharge Plan:  Home w Home Health Services  In-House Referral:  NA  Discharge planning Services  CM Consult  Post Acute Care Choice:  NA Choice offered to:  Patient  DME Arranged:    DME Agency:     HH Arranged:  RN, PT, OT HH Agency:  [facility]  Status of Service:  In process, will continue to follow  If discussed at Long Length of Stay Meetings, dates discussed:    Additional Comments:  Malcolm MetroChildress, Lila Lufkin Demske, RN 04/12/2017, 10:34 AM

## 2017-04-12 NOTE — Evaluation (Signed)
Occupational Therapy Evaluation Patient Details Name: Shannon [name] MRN: [id number] DOB: 08/29/1951 Today's Date: 04/12/2017    History of Present Illness Shannon [name]  is a 66 y.o. female, With history of right posterior cerebral artery distribution stroke associated with some left-sided numbness and dense left homonymous visual field deficit, hypertension, diabetes mellitus who came to hospital with 3-4 days history of dizziness and blurry vision. Patient has had multiple admissions for stroke workup in the past. Her last admission was to [facility] on 01/22/2017 for complaints of increasing weakness of left lower extremity with increasing visual problems on left, at that time she had full stroke workupMRI of the brain no acute findings carotid Doppler negative significant stenosis 2Decho 60-65.% LDL 105 hemoglobin A1c 8.6 Her Lipitor was increased to 80 mg daily. Patient currently takes aspirin and Plavix at home.   Clinical Impression   Patient in bed upon therapy arrival. Initially stating that she had a rough night and needed to sleep although was agreeable to participate in OT evaluation. Patient presents with bilateral generalized UB weakness more than likely due to hospitalization. Pt reports blurry vision in both eyes and has chronic left side homonymous heminanopia. Recommend patient return to son's house for 24 hour supervision and receive HH OT services. Acute OT will focus on mentioned deficits while admitted to hospital.      Follow Up Recommendations  Home health OT    Equipment Recommendations  None recommended by OT       Precautions / Restrictions Precautions Precautions: Fall Precaution Comments: Due to immobility and recent infarction. Hx of CVA. Restrictions Weight Bearing Restrictions: No      Mobility Bed Mobility Overal bed mobility: Needs Assistance Bed Mobility: Supine to Sit     Supine to sit: Supervision     General bed mobility comments:  extended time for supine to sit, able to scoot to EOB slowly from seated position but required assistance to fully scoot towards the edge of the bed.  Transfers Overall transfer level: Needs assistance Equipment used: Rolling walker (2 wheeled) Transfers: Sit to/from Stand Sit to Stand: Min assist         General transfer comment: See PT note for transfer assistance.    Balance Overall balance assessment: Needs assistance Sitting-balance support: No upper extremity supported Sitting balance-Leahy Scale: Good     Standing balance support: No upper extremity supported Standing balance-Leahy Scale: Fair                             ADL either performed or assessed with clinical judgement   ADL Overall ADL's : Needs assistance/impaired                     Lower Body Dressing: Minimal assistance;Sitting/lateral leans Lower Body Dressing Details (indicate cue type and reason): patient required assistance with right foot sock. Toilet Transfer: Minimal assistance;RW                   Vision Baseline Vision/History:  (Left chronic homonymous hemianopsia) Patient Visual Report: Blurring of vision Vision Assessment?: Yes Alignment/Gaze Preference: Within Defined Limits Visual Fields: Left homonymous hemianopsia            Pertinent Vitals/Pain Pain Assessment: 0-10 Pain Score: 5  Pain Location: neck, shoulders, back, legs Pain Descriptors / Indicators: Aching Pain Intervention(s): Limited activity within patient's tolerance;Monitored during session;Patient requesting pain meds-RN notified     Hand  Dominance Right   Extremity/Trunk Assessment Upper Extremity Assessment Upper Extremity Assessment: Generalized weakness   Lower Extremity Assessment Lower Extremity Assessment: Defer to PT evaluation   Cervical / Trunk Assessment Cervical / Trunk Assessment: Kyphotic   Communication Communication Communication: No difficulties   Cognition  Arousal/Alertness: Awake/alert Behavior During Therapy: Anxious;WFL for tasks assessed/performed Overall Cognitive Status: Within Functional Limits for tasks assessed                     Home Living Family/patient expects to be discharged to:: Private residence Living Arrangements: Children (Son and daughter in Social workerlaw) Available Help at Discharge: Family Type of Home: Mobile home (Double wide) Home Access: Stairs to enter Entrance Stairs-Number of Steps: 5-6 Entrance Stairs-Rails: Left;Right;Can reach both Home Layout: One level     Bathroom Shower/Tub: Producer, television/film/videoWalk-in shower   Bathroom Toilet: Standard     Home Equipment: IT sales professionalCane - quad;Walker - 2 wheels;Shower seat          Prior Functioning/Environment Level of Independence: Independent with assistive device(s)        Comments: patient was living alone and recently moved in with son and daughter in law. She has been staying with them for 1-2 months. Son assists with housekeeping and meal prep. Patient reports she was able to bath and dress without assistance.         OT Problem List: Decreased strength;Pain;Decreased activity tolerance      OT Treatment/Interventions: Self-care/ADL training;Therapeutic exercise;Therapeutic activities;Neuromuscular education;DME and/or AE instruction;Patient/family education;Balance training;Modalities    OT Goals(Current goals can be found in the care plan section) Acute Rehab OT Goals Patient Stated Goal: to go home  OT Goal Formulation: With patient Time For Goal Achievement: 04/26/17 Potential to Achieve Goals: Good  OT Frequency: Min 2X/week           Co-evaluation PT/OT/SLP Co-Evaluation/Treatment: Yes Reason for Co-Treatment: For patient/therapist safety;To address functional/ADL transfers   OT goals addressed during session: ADL's and self-care;Proper use of Adaptive equipment and DME;Strengthening/ROM      AM-PAC PT "6 Clicks" Daily Activity     Outcome Measure Help from  another person eating meals?: None Help from another person taking care of personal grooming?: A Little Help from another person toileting, which includes using toliet, bedpan, or urinal?: A Little Help from another person bathing (including washing, rinsing, drying)?: A Lot Help from another person to put on and taking off regular upper body clothing?: A Little Help from another person to put on and taking off regular lower body clothing?: A Lot 6 Click Score: 17   End of Session Equipment Utilized During Treatment: Gait belt;Rolling walker Nurse Communication: Other (comment) (Bed alarm was off from therapy session)  Activity Tolerance: Patient tolerated treatment well;Patient limited by fatigue Patient left: in bed;with call bell/phone within reach;with nursing/sitter in room  OT Visit Diagnosis: Muscle weakness (generalized) (M62.81)                Time: [id number] OT Time Calculation (min): 33 min Charges:  OT General Charges $OT Visit: 1 Procedure OT Evaluation $OT Eval Low Complexity: 1 Procedure G-Codes: OT G-codes **NOT FOR INPATIENT CLASS** Functional Assessment Tool Used: AM-PAC 6 Clicks Daily Activity Functional Limitation: Self care Self Care Current Status (Z3086(G8987): At least 40 percent but less than 60 percent impaired, limited or restricted Self Care Goal Status (V7846(G8988): At least 20 percent but less than 40 percent impaired, limited or restricted   Limmie PatriciaLaura Essenmacher, OTR/L,CBIS  [phone]   Essenmacher, Vernona RiegerLaura  D 04/12/2017, 11:05 AM

## 2017-04-12 NOTE — Evaluation (Signed)
Physical Therapy Evaluation Patient Details Name: Shannon [name] MRN: [id number] DOB: [birth year] Today's Date: 04/12/2017   History of Present Illness  Shannon [name]  is a 66 y.o. female, With history of right posterior cerebral artery distribution stroke associated with some left-sided numbness and dense left homonymous visual field deficit, hypertension, diabetes mellitus who came to hospital with 3-4 days history of dizziness and blurry vision. Patient has had multiple admissions for stroke workup in the past. Her last admission was to [facility] on 01/22/2017 for complaints of increasing weakness of left lower extremity with increasing visual problems on left, at that time she had full stroke workupMRI of the brain no acute findings carotid Doppler negative significant stenosis 2Decho 60-65.% LDL 105 hemoglobin A1c 8.6 Her Lipitor was increased to 80 mg daily. Patient currently takes aspirin and Plavix at home.  Clinical Impression  Performed skilled PT evaluation today, OT also present for evaluation due to patient hesitancy to participate today due to fatigue and neck/shoulder pain; patient otherwise very pleasant and cooperative today. Able to complete bed mobility with general S, however did require Min(A) to scoot forward to edge of bed likely due to generalized weakness today. Able to perform sit to stand with Min(A) from bed and ambulates fairly well with walker/no significant unsteadiness with this device. Patient reports she would refuse to go to SNF however is open to HHPT; at this point patient does appear appropriate to return to living with her son with skilled HHPT services and walker use- she also requests information regarding home aide. Patient left sitting at edge of bed with RN present and preparing to give medication, all patient needs otherwise met.     Follow Up Recommendations Home health PT;Other (comment) (patient refuses SNF ) Patient also requesting information  regarding in home aide.     Equipment Recommendations  Standard walker;Other (comment) (patient reports she may have a walker already at home- please double check this with son and we can order new one only if needed )    Recommendations for Other Services       Precautions / Restrictions Precautions Precautions: Fall Restrictions Weight Bearing Restrictions: No      Mobility  Bed Mobility Overal bed mobility: Needs Assistance Bed Mobility: Supine to Sit     Supine to sit: Supervision     General bed mobility comments: extended time for supine to sit, able to scoot to EOB slowly from seated position but required Min(A) for more effective movement   Transfers Overall transfer level: Needs assistance Equipment used: Rolling walker (2 wheeled) Transfers: Sit to/from Stand Sit to Stand: Min assist         General transfer comment: cues for hand placement, technique   Ambulation/Gait Ambulation/Gait assistance: Supervision Ambulation Distance (Feet): 18 Feet (in room ) Assistive device: Rolling walker (2 wheeled) Gait Pattern/deviations: Step-through pattern;Trendelenburg;Drifts right/left;Trunk flexed;Narrow base of support        Stairs            Wheelchair Mobility    Modified Rankin (Stroke Patients Only)       Balance Overall balance assessment: Needs assistance Sitting-balance support: No upper extremity supported Sitting balance-Leahy Scale: Good     Standing balance support: No upper extremity supported Standing balance-Leahy Scale: Fair                               Pertinent Vitals/Pain Pain Assessment: 0-10 Pain Score:  8  Pain Location: neck and shoulders  Pain Descriptors / Indicators: Aching Pain Intervention(s): Limited activity within patient's tolerance;Monitored during session    Home Living Family/patient expects to be discharged to:: Private residence Living Arrangements: Children Available Help at Discharge:  Family Type of Home: House Home Access: Stairs to enter     Home Layout: One level Home Equipment: Environmental consultant - 2 wheels;Cane - quad      Prior Function Level of Independence: Independent         Comments: living with son who takes care of housework/cooking, patient otherwise independent with cane      Hand Dominance        Extremity/Trunk Assessment   Upper Extremity Assessment Upper Extremity Assessment: Defer to OT evaluation    Lower Extremity Assessment Lower Extremity Assessment: Generalized weakness    Cervical / Trunk Assessment Cervical / Trunk Assessment: Kyphotic  Communication   Communication: No difficulties  Cognition Arousal/Alertness: Awake/alert Behavior During Therapy: Anxious;WFL for tasks assessed/performed Overall Cognitive Status: Within Functional Limits for tasks assessed                                        General Comments      Exercises     Assessment/Plan    PT Assessment Patient needs continued PT services  PT Problem List Decreased strength;Decreased mobility;Decreased safety awareness;Decreased coordination;Decreased balance;Decreased knowledge of use of DME;Pain       PT Treatment Interventions DME instruction;Therapeutic activities;Gait training;Therapeutic exercise;Patient/family education;Stair training;Balance training;Functional mobility training;Neuromuscular re-education;Manual techniques    PT Goals (Current goals can be found in the Care Plan section)  Acute Rehab PT Goals Patient Stated Goal: to go home  PT Goal Formulation: With patient Time For Goal Achievement: 04/26/17 Potential to Achieve Goals: Good    Frequency Min 5X/week   Barriers to discharge        Co-evaluation               AM-PAC PT "6 Clicks" Daily Activity  Outcome Measure Difficulty turning over in bed (including adjusting bedclothes, sheets and blankets)?: A Little Difficulty moving from lying on back to sitting  on the side of the bed? : A Little Difficulty sitting down on and standing up from a chair with arms (e.g., wheelchair, bedside commode, etc,.)?: A Little Help needed moving to and from a bed to chair (including a wheelchair)?: A Little Help needed walking in hospital room?: A Little Help needed climbing 3-5 steps with a railing? : A Lot 6 Click Score: 17    End of Session Equipment Utilized During Treatment: Gait belt Activity Tolerance: Patient tolerated treatment well Patient left: in bed;with nursing/sitter in room   PT Visit Diagnosis: Unsteadiness on feet (R26.81);Other abnormalities of gait and mobility (R26.89);Muscle weakness (generalized) (M62.81);Difficulty in walking, not elsewhere classified (R26.2)    Time: 1610-9604 PT Time Calculation (min) (ACUTE ONLY): 23 min   Charges:   PT Evaluation $PT Eval Low Complexity: 1 Procedure     PT G Codes:   PT G-Codes **NOT FOR INPATIENT CLASS** Functional Assessment Tool Used: AM-PAC 6 Clicks Basic Mobility;Clinical judgement Functional Limitation: Mobility: Walking and moving around Mobility: Walking and Moving Around Current Status (V4098): At least 40 percent but less than 60 percent impaired, limited or restricted Mobility: Walking and Moving Around Goal Status [phone]): At least 20 percent but less than 40 percent impaired, limited or restricted  Deniece Ree PT, DPT [phone]

## 2017-04-12 NOTE — Progress Notes (Signed)
Pt arrived back on the floor.  

## 2017-04-12 NOTE — Telephone Encounter (Signed)
Hi Shannon [name], This is a patient Dr. Diona BrownerMcDowell and I saw at [facility] in consult for recurrent stroke. TEE was negative for embolism. Dr. Diona BrownerMcDowell recommends she follow-up as outpatient for implantable loop recorder insertion. Can you help arrange? I suspect she will be discharged from [facility] within the next day or two. I spoke with her this afternoon over the phone about this and she is amenable.   She also needs f/u at some point in the next few months to re-establish with Dr. Anne FuSkains. He is her cardiologist but it's been a while since she saw him.  Thanks! Adra Shepler Mooneyham PA-C

## 2017-04-12 NOTE — Evaluation (Signed)
Clinical/Bedside Swallow Evaluation Patient Details  Name: Arnice Vanepps MRN: [id number] Date of Birth: [birth year]  Today's Date: 04/12/2017 Time: SLP Start Time (ACUTE ONLY): 1630 SLP Stop Time (ACUTE ONLY): 1700 SLP Time Calculation (min) (ACUTE ONLY): 30 min  Past Medical History:  Past Medical History:  Diagnosis Date  . Anxiety   . Arthritis   . Coronary artery disease    a. s/p PCI with DES to LAD in 2011  (LHC 03/2010: Mid LAD 70%, normal ejection fraction  >> PCI:  2.75 x 15 mm Promus DES to LAD); b. Nuclear Stress Test 05/29/14: No ischemia, EF 59%, Normal; c. NSTEMI 11/2014 >> patient declined LHC b/c of recent CVA  . Depression   . Diabetes mellitus without complication (HCC)   . Dyslipidemia 11/20/2014  . Fibromyalgia 1995  . H/O ischemic right PCA stroke 10/2014   a. Carotid US 10/24/14: No significant ICA stenosis bilaterally  . Hemiparesis and alteration of sensations as late effects of stroke (HCC) 06/05/2015  . Hx of echocardiogram    a. Echocardiogram 10/25/14: Moderate LVH, EF 50-55%, normal wall motion, grade 2 diastolic dysfunction, mild LAE, atrial septal lipomatous hypertrophy  . Hypertension   . Irritable bowel    Past Surgical History:  Past Surgical History:  Procedure Laterality Date  . CHOLECYSTECTOMY    . CORONARY STENT PLACEMENT    . orif arm fracture Right    forearm   HPI:  66 y/o woman admitted from home on 6/1 due to dizziness and right-sided weakness. Found to have multiple acute infarctions of the left MCA territory. Admission requested.   Assessment / Plan / Recommendation Clinical Impression  Oropharyngeal swallow appears WFL for textures and consistencies presented. Pt shows no overt signs or symptoms of aspiration at bedside and she denies difficulty swallowing. Pt does appear to have cognitive linguistic needs, which will need further assessment. Pt oriented to situation, but exhibits mild impulsivity and some word substitutions. SLP will  follow for SLE tomorrow.  SLP Visit Diagnosis: Dysphagia, oropharyngeal phase (R13.12)    Aspiration Risk  No limitations    Diet Recommendation Regular;Thin liquid   Liquid Administration via: Cup;Straw Medication Administration: Whole meds with liquid Supervision: Patient able to self feed;Intermittent supervision to cue for compensatory strategies Postural Changes: Seated upright at 90 degrees;Remain upright for at least 30 minutes after po intake    Other  Recommendations Oral Care Recommendations: Oral care BID Other Recommendations: Clarify dietary restrictions   Follow up Recommendations Home health SLP (for cognitive linguistic needs)      Frequency and Duration            Prognosis Prognosis for Safe Diet Advancement: Good      Swallow Study   General Date of Onset: 04/09/17 HPI: 66 y/o woman admitted from home on 6/1 due to dizziness and right-sided weakness. Found to have multiple acute infarctions of the left MCA territory. Admission requested. Type of Study: Bedside Swallow Evaluation Previous Swallow Assessment: None on record Diet Prior to this Study: NPO Temperature Spikes Noted: No Respiratory Status: Room air History of Recent Intubation: No Behavior/Cognition: Alert;Cooperative;Pleasant mood Oral Cavity Assessment: Within Functional Limits Oral Care Completed by SLP: Recent completion by staff Oral Cavity - Dentition: Adequate natural dentition;Missing dentition Vision: Functional for self-feeding Self-Feeding Abilities: Able to feed self Patient Positioning: Upright in bed Baseline Vocal Quality: Normal Volitional Cough: Strong Volitional Swallow: Able to elicit    Oral/Motor/Sensory Function Overall Oral Motor/Sensory Function: Within functional limits  Ice Chips Ice chips: Within functional limits   Thin Liquid Thin Liquid: Within functional limits Presentation: Cup;Self Fed;Straw    Nectar Thick Nectar Thick Liquid: Not tested   Honey  Thick Honey Thick Liquid: Not tested   Puree Puree: Within functional limits Presentation: Spoon   Solid   Thank you,  Havery MorosDabney Arles Rumbold, CCC-SLP [phone]    Solid: Within functional limits Presentation: Self Fed        Tasharra Nodine 04/12/2017,5:08 PM

## 2017-04-12 NOTE — Anesthesia Postprocedure Evaluation (Signed)
Anesthesia Post Note  Patient: Shannon [name]  Procedure(s) Performed: Procedure(s) (LRB): TRANSESOPHAGEAL ECHOCARDIOGRAM (TEE) WITH PROPOFOL (N/A)  Patient location during evaluation: PACU Anesthesia Type: MAC Level of consciousness: awake and alert Pain management: satisfactory to patient Vital Signs Assessment: post-procedure vital signs reviewed and stable Respiratory status: spontaneous breathing Cardiovascular status: stable Anesthetic complications: no     Last Vitals:  Vitals:   04/12/17 1410 04/12/17 1451  BP:  (!) 106/92  Pulse:  90  Resp: (!) 21 18  Temp:  36.4 C    Last Pain:  Vitals:   04/12/17 1332  TempSrc: Oral  PainSc: 6                  Greysen Swanton

## 2017-04-12 NOTE — Progress Notes (Signed)
PROGRESS NOTE    Shannon [name]  WUJ:[id number] DOB: 06/07/1951 DOA: 04/09/2017 PCP: Nila NephewGreen, Edwin, MD     Brief Narrative:  66 y/o woman admitted from home on 6/1 due to dizziness and right-sided weakness. Found to have multiple acute infarctions of the left MCA territory. Admission requested.   Assessment & Plan:   Active Problems:   Hypertension, essential   Weakness   Hypothyroidism   Visual disturbance   Dizziness   CVA (cerebrovascular accident) (HCC)   Acute Left MCA CVA -On ASA and plavix. -Had complete [facility] when she was admitted for stroke-like symptoms: MRI of the brain no acute findings carotid Doppler negative significant stenosis 2Decho 60-65.% LDL 105 hemoglobin A1c 8.6 Her Lipitor was increased to 80 mg daily. -Discussed case with neurology on 6/2, Dr. Otelia LimesLindzen. He recommends TEE and 30 day event monitor. -Is for TEE today. Appreciate cardiology's assistance. -PT recs HH therapy.  Hypothyroidism -Continue home dose of synthroid.  DM -Well controlled. -Monitor and adjust as needed.  HTN -Fair control. -Allow some permissive HTN given acute CVA. -Do not anticipate further medications changes while in the hospital.  CAD -Stable, no CP. -Continue ASA, plavix, nitro   DVT prophylaxis: lovenox Code Status: DNR Family Communication: patient only Disposition Plan: Home once work up complete.  Consultants:   Cardiology  Procedures:   TEE scheduled for 6/4.  Antimicrobials:  Anti-infectives    None       Subjective: Remains anxious.  Objective: Vitals:   04/11/17 0600 04/11/17 1532 04/11/17 2200 04/12/17 0640  BP:  (!) 156/77 (!) 166/84 (!) 141/70  Pulse:  (!) 106 (!) 102 78  Resp:  18 18 18   Temp:  99.2 F (37.3 C) 97.4 F (36.3 C) 98.3 F (36.8 C)  TempSrc:  Oral Oral Oral  SpO2:  100% 99% 95%  Weight: 69.6 kg (153 lb 7 oz)     Height:        Intake/Output Summary (Last 24 hours) at 04/12/17  1108 Last data filed at 04/11/17 2237  Gross per 24 hour  Intake              360 ml  Output             2200 ml  Net            -1840 ml   Filed Weights   04/09/17 1601 04/09/17 2201 04/11/17 0600  Weight: 72.6 kg (160 lb) 67.4 kg (148 lb 11.2 oz) 69.6 kg (153 lb 7 oz)    Examination:  General exam: Alert, awake, oriented x 3 Respiratory system: Clear to auscultation. Respiratory effort normal. Cardiovascular system:RRR. No murmurs, rubs, gallops. Gastrointestinal system: Abdomen is nondistended, soft and nontender. No organomegaly or masses felt. Normal bowel sounds heard. Central nervous system: Alert and oriented.  Extremities: No C/C/E, +pedal pulses Skin: No rashes, lesions or ulcers Psychiatry: Anxious      Data Reviewed: I have personally reviewed following labs and imaging studies  CBC:  Recent Labs Lab 04/09/17 1600 04/10/17 0534 04/11/17 0453  WBC 8.5 6.9 6.9  NEUTROABS 5.9  --   --   HGB 13.6 11.8* 12.2  HCT 39.3 34.4* 36.2  MCV 81.7 82.7 82.8  PLT 198 167 160   Basic Metabolic Panel:  Recent Labs Lab 04/09/17 1600 04/10/17 0534 04/11/17 0453  NA 135 142 139  K 3.4* 3.8 4.0  CL 102 113* 108  CO2 21* 25  26  GLUCOSE 328* 119* 137*  BUN 15 10 8   CREATININE 1.08* 0.93 0.88  CALCIUM 9.6 8.4* 8.5*   GFR: Estimated Creatinine Clearance: 59.4 mL/min (by C-G formula based on SCr of 0.88 mg/dL). Liver Function Tests:  Recent Labs Lab 04/09/17 1600 04/10/17 0534  AST 30 14*  ALT 28 19  ALKPHOS 69 51  BILITOT 0.6 0.5  PROT 7.0 5.4*  ALBUMIN 4.0 3.1*   No results for input(s): LIPASE, AMYLASE in the last 168 hours. No results for input(s): AMMONIA in the last 168 hours. Coagulation Profile: No results for input(s): INR, PROTIME in the last 168 hours. Cardiac Enzymes:  Recent Labs Lab 04/09/17 1600  TROPONINI <0.03   BNP (last 3 results) No results for input(s): PROBNP in the last 8760 hours. HbA1C: No results for input(s): HGBA1C  in the last 72 hours. CBG:  Recent Labs Lab 04/11/17 0749 04/11/17 1122 04/11/17 1653 04/11/17 2109 04/12/17 0736  GLUCAP 116* 124* 187* 244* 147*   Lipid Profile: No results for input(s): CHOL, HDL, LDLCALC, TRIG, CHOLHDL, LDLDIRECT in the last 72 hours. Thyroid Function Tests: No results for input(s): TSH, T4TOTAL, FREET4, T3FREE, THYROIDAB in the last 72 hours. Anemia Panel: No results for input(s): VITAMINB12, FOLATE, FERRITIN, TIBC, IRON, RETICCTPCT in the last 72 hours. Urine analysis:    Component Value Date/Time   COLORURINE STRAW (A) 04/09/2017 1800   APPEARANCEUR CLEAR 04/09/2017 1800   LABSPEC 1.000 (L) 04/09/2017 1800   PHURINE 5.0 04/09/2017 1800   GLUCOSEU >=500 (A) 04/09/2017 1800   HGBUR NEGATIVE 04/09/2017 1800   BILIRUBINUR NEGATIVE 04/09/2017 1800   BILIRUBINUR neg 04/13/2014 1733   KETONESUR NEGATIVE 04/09/2017 1800   PROTEINUR NEGATIVE 04/09/2017 1800   UROBILINOGEN 0.2 04/13/2014 1733   NITRITE NEGATIVE 04/09/2017 1800   LEUKOCYTESUR NEGATIVE 04/09/2017 1800   Sepsis Labs: @LABRCNTIP (procalcitonin:4,lacticidven:4)  )No results found for this or any previous visit (from the past 240 hour(s)).       Radiology Studies: No results found.      Scheduled Meds: . aspirin EC  81 mg Oral Daily  . atorvastatin  80 mg Oral QPM  . cholecalciferol  1,000 Units Oral Daily  . clopidogrel  75 mg Oral Daily  . doxepin  10 mg Oral QHS  . enoxaparin (LOVENOX) injection  40 mg Subcutaneous Q24H  . feeding supplement (GLUCERNA SHAKE)  237 mL Oral TID BM  . gabapentin  400 mg Oral BID  . insulin aspart  0-9 Units Subcutaneous TID WC  . isosorbide mononitrate  30 mg Oral QHS  . levothyroxine  25 mcg Oral QAC breakfast  . temazepam  30 mg Oral QHS  . vitamin C  250 mg Oral Daily   Continuous Infusions: . sodium chloride 75 mL/hr at 04/12/17 1107  . sodium chloride 20 mL/hr at 04/12/17 1100     LOS: 2 days    Time spent: 25 minutes. Greater than  50% of this time was spent in direct contact with the patient coordinating care.     Chaya Jan, MD [facility] Pager [phone]  If 7PM-7AM, please contact night-coverage [email or web address] Password [email or web address] 04/12/2017, 11:08 AM

## 2017-04-12 NOTE — Progress Notes (Signed)
This note also relates to the following rows which could not be included: Pulse Rate - Cannot attach notes to unvalidated device data ECG Heart Rate - Cannot attach notes to unvalidated device data Resp - Cannot attach notes to unvalidated device data SpO2 - Cannot attach notes to unvalidated device data   

## 2017-04-12 NOTE — Anesthesia Procedure Notes (Signed)
Procedure Name: MAC Date/Time: 04/12/2017 2:10 PM Performed by: Vista Deck Pre-anesthesia Checklist: Patient identified, Emergency Drugs available, Suction available, Timeout performed and Patient being monitored Patient Re-evaluated:Patient Re-evaluated prior to inductionOxygen Delivery Method: Non-rebreather mask

## 2017-04-13 LAB — GLUCOSE, CAPILLARY
GLUCOSE-CAPILLARY: 120 mg/dL — AB (ref 65–99)
Glucose-Capillary: 160 mg/dL — ABNORMAL HIGH (ref 65–99)

## 2017-04-13 NOTE — Evaluation (Signed)
Speech Language Pathology Evaluation Patient Details Name: Shannon [name] MRN: [id number] DOB: 05/25/1951 Today's Date: 04/13/2017 Time: [id number] SLP Time Calculation (min) (ACUTE ONLY): 22 min  Problem List:  Patient Active Problem List   Diagnosis Date Noted  . CVA (cerebrovascular accident) (HCC) 04/10/2017  . Dizziness 04/09/2017  . Hemiparesis and alteration of sensations as late effects of stroke (HCC) 06/05/2015  . Numbness 12/27/2014  . Visual disturbance 11/26/2014  . Dyslipidemia 11/20/2014  . Hypothyroidism 11/20/2014  . Pain   . Cerebral thrombosis with cerebral infarction (HCC) 11/19/2014  . Weakness   . NSTEMI (non-ST elevated myocardial infarction) (HCC) 11/17/2014  . Acute CVA -Dec 2015 10/24/2014  . DM type 2 (diabetes mellitus, type 2) (HCC) 10/24/2014  . Accelerated hypertension 10/24/2014  . Atypical chest pain 05/16/2014  . CAD S/P LAD PCI July 2011 07/01/2013  . Hypertension, essential 07/01/2013  . Fibromyalgia 07/01/2013  . IBS (irritable bowel syndrome) 07/01/2013   Past Medical History:  Past Medical History:  Diagnosis Date  . Anxiety   . Arthritis   . Coronary artery disease    a. s/p PCI with DES to LAD in 2011  (LHC 03/2010: Mid LAD 70%, normal ejection fraction  >> PCI:  2.75 x 15 mm Promus DES to LAD); b. Nuclear Stress Test 05/29/14: No ischemia, EF 59%, Normal; c. NSTEMI 11/2014 >> patient declined LHC b/c of recent CVA  . Depression   . Diabetes mellitus without complication (HCC)   . Dyslipidemia 11/20/2014  . Fibromyalgia 1995  . H/O ischemic right PCA stroke 10/2014   a. Carotid US 10/24/14: No significant ICA stenosis bilaterally  . Hemiparesis and alteration of sensations as late effects of stroke (HCC) 06/05/2015  . Hx of echocardiogram    a. Echocardiogram 10/25/14: Moderate LVH, EF 50-55%, normal wall motion, grade 2 diastolic dysfunction, mild LAE, atrial septal lipomatous hypertrophy  . Hypertension   . Irritable bowel    Past  Surgical History:  Past Surgical History:  Procedure Laterality Date  . CHOLECYSTECTOMY    . CORONARY STENT PLACEMENT    . orif arm fracture Right    forearm   HPI:  66 y/o woman admitted from home on 6/1 due to dizziness and right-sided weakness. Found to have multiple acute infarctions of the left MCA territory. Admission requested.   Assessment / Plan / Recommendation Clinical Impression  Pt agreeable to cognitive linguistic evaluation. She reports that overall, her ability to think clearly and communicate has improved since admission. Performance likely negatively impacted by decreased attention (c/o pain in multiple places and then diarrhea and RN now aware). Pt exhibits mild cognitive linguistic deficits characterized by decreased attention, working memory for recall of orientation, delayed responses to verbal questions (suspect related less to comprehension and more to word retrieval), and difficulty with thought organization. Recommend f/u SLP services to address deficits either in home health or outpatient setting. No further acute needs identified at this time.     SLP Assessment  SLP Recommendation/Assessment: All further Speech Lanaguage Pathology  needs can be addressed in the next venue of care SLP Visit Diagnosis: Cognitive communication deficit (R41.841)    Follow Up Recommendations  Home health SLP;Outpatient SLP    Frequency and Duration           SLP Evaluation Cognition  Overall Cognitive Status: Impaired/Different from baseline Arousal/Alertness: Awake/alert Orientation Level: Oriented to person;Oriented to place;Oriented to situation;Disoriented to time Attention: Selective Selective Attention: Impaired Selective Attention Impairment: Verbal complex Memory: Appears  intact Awareness: Appears intact Problem Solving: Appears intact Safety/Judgment: Appears intact       Comprehension  Auditory Comprehension Overall Auditory Comprehension: Appears within  functional limits for tasks assessed Yes/No Questions: Within Functional Limits Commands: Within Functional Limits Conversation: Simple Interfering Components: Attention;Pain Visual Recognition/Discrimination Discrimination: Not tested Reading Comprehension Reading Status: Not tested    Expression Expression Primary Mode of Expression: Verbal Verbal Expression Overall Verbal Expression: Impaired Initiation: No impairment Automatic Speech: Name;Day of week;Social Response;Month of year Level of Generative/Spontaneous Verbalization: Conversation Repetition: No impairment Naming: Impairment Responsive: 76-100% accurate Confrontation: Within functional limits Convergent: Not tested Divergent: 75-100% accurate Pragmatics: No impairment Interfering Components: Attention Effective Techniques: Open ended questions Non-Verbal Means of Communication: Not applicable Written Expression Dominant Hand: Right Written Expression: Not tested   Oral / Motor  Oral Motor/Sensory Function Overall Oral Motor/Sensory Function: Within functional limits Motor Speech Overall Motor Speech: Appears within functional limits for tasks assessed Respiration: Within functional limits Phonation: Normal Resonance: Within functional limits Articulation: Within functional limitis Intelligibility: Intelligible Motor Planning: Witnin functional limits Motor Speech Errors: Not applicable   Thank you,  Havery Moros, CCC-SLP [phone]                     Shannon [name] 04/13/2017, 10:27 AM

## 2017-04-13 NOTE — Care Management Important Message (Signed)
Important Message  Patient Details  Name: Shannon [name] MRN: [id number] Date of Birth: 01/04/1951   Medicare Important Message Given:  Yes    Malcolm MetroChildress, Anahli Arvanitis Demske, RN 04/13/2017, 11:55 AM

## 2017-04-13 NOTE — Care Management Note (Signed)
Case Management Note  Patient Details  Name: Shannon [name] MRN: [id number] Date of Birth: 05/06/1951   Expected Discharge Date:  04/13/17               Expected Discharge Plan:  Home w Home Health Services  In-House Referral:  NA  Discharge planning Services  CM Consult  Post Acute Care Choice:  NA Choice offered to:  Patient  HH Arranged:  RN, PT, OT [facility] Agency:  [facility]  Status of Service:  Completed, signed off  Additional Comments: Pt discharging home today with HH. Pt aware HH has 48hrs to make first visit. [facility] rep, Bonita QuinLinda, aware of DC today and will obtain orders from chart. Pt provided [facility] resources including transportation and COA (per pt request). CM has made multiple attempts both on 6/5 and 6/4 to contact pt son with no luck. CM did make contact with Huntley DecSara and DC plans discussed, Huntley DecSara communicated no questions or concerns about DC plan.   Malcolm Metrohildress, Dagan Heinz Demske, RN 04/13/2017, 11:49 AM

## 2017-04-13 NOTE — Addendum Note (Signed)
Addendum  created 04/13/17 1203 by Despina HiddenIdacavage, Masen Salvas J, CRNA   Sign clinical note

## 2017-04-13 NOTE — Anesthesia Postprocedure Evaluation (Signed)
Anesthesia Post Note  Patient: Shannon [name]  Procedure(s) Performed: Procedure(s) (LRB): TRANSESOPHAGEAL ECHOCARDIOGRAM (TEE) WITH PROPOFOL (N/A)  Patient location during evaluation: Nursing Unit Anesthesia Type: MAC Level of consciousness: awake and alert and patient cooperative Pain management: pain level controlled Vital Signs Assessment: post-procedure vital signs reviewed and stable Respiratory status: spontaneous breathing, nonlabored ventilation and respiratory function stable Cardiovascular status: blood pressure returned to baseline Postop Assessment: adequate PO intake and no signs of nausea or vomiting Anesthetic complications: no     Last Vitals:  Vitals:   04/12/17 2143 04/13/17 0639  BP: (!) 160/82 (!) 156/69  Pulse: 92 (!) 102  Resp: 18 18  Temp: 36.9 C 37.1 C    Last Pain:  Vitals:   04/13/17 0830  TempSrc:   PainSc: 2                  Kawena Lyday J

## 2017-04-13 NOTE — Discharge Summary (Signed)
Physician Discharge Summary  Shannon [name] FAO:[id number] DOB: 03/16/1951 DOA: 04/09/2017  PCP: Nila NephewGreen, Edwin, MD  Admit date: 04/09/2017 Discharge date: 04/13/2017  Time spent: 45 minutes  Recommendations for Outpatient Follow-up:  -Will be discharged home today. -Advised to follow up with PCP in 2 weeks. -Cardiology is arranging OP follow up for implantation of a loop recorder.   Discharge Diagnoses:  Active Problems:   Hypertension, essential   Weakness   Hypothyroidism   Visual disturbance   Dizziness   CVA (cerebrovascular accident) [facility](HCC)   Discharge Condition: Stable and improved  Filed Weights   04/09/17 2201 04/11/17 0600 04/12/17 1332  Weight: 67.4 kg (148 lb 11.2 oz) 69.6 kg (153 lb 7 oz) 69.4 kg (153 lb)    History of present illness:  As per Dr. Sharl MaLama on 6/1: Shannon [name]  is a 66 y.o. female, With history of right posterior cerebral artery distribution stroke associated with some left-sided numbness and dense left homonymous visual field deficit, hypertension, diabetes mellitus who came to hospital with 3-4 days history of dizziness and blurry vision. Patient has had multiple admissions for stroke workup in the past. Her last admission was to [facility] on 01/22/2017 for complaints of increasing weakness of left lower extremity with increasing visual problems on left, at that time she had full stroke workupMRI of the brain no acute findings carotid Doppler negative significant stenosis 2Decho 60-65.% LDL 105 hemoglobin A1c 8.6 Her Lipitor was increased to 80 mg daily. Patient currently takes aspirin and Plavix at home.  At this time patient is unable to provide significant history as appears very stressed, unable to complete one sentence and starts crying. She says that she has been undergoing a lot of stress lately due to her daughter. Of note that patient was seen in March 2018 by [facility] and had similar complaints, at that time also patient was  stressed due to her daughter.  She denies chest pain or shortness of breath. No nausea vomiting or diarrhea. Not able to provide good history except for dizziness and blurred vision in left eye which is chronic.  In the ED today, CT head showed interval development of 8 mm hypoattenuating lesion posterior left frontal white matter, may be subacute to chronic ischemia.   Hospital Course:   Acute Left MCA CVA -On ASA and plavix. -Had complete work up in 3/18 at [facility] when she was admitted for stroke-like symptoms: MRI of the brain no acute findings carotid Doppler negative significant stenosis 2Decho 60-65.% LDL 105 hemoglobin A1c 8.6 Her Lipitor was increased to 80 mg daily. -Discussed case with neurology on 6/2, Dr. Otelia LimesLindzen. He recommends TEE and loop recorder. -TEE completed: no source of cardioembolic disease appreciated. See below for full report. -PT recs HH therapy.  Hypothyroidism -Continue home dose of synthroid.  DM -Well controlled. -Monitor and adjust as needed.  HTN -Fair control. -Allow some permissive HTN given acute CVA. -Do not anticipate further medications changes while in the hospital.  CAD -Stable, no CP. -Continue ASA, plavix, nitro  Procedures:  TEE: Description of procedure: Patient taken to the procedure suite after informed consent was obtained. Timeout performed. She was placed in the left lateral decubitus position with sedation provided by the Anesthesia service using propofol. Bite block in place. Transesophageal echocardiogram probe was inserted into the esophagus and multiple images were obtained. Please refer to the final report. In summary, there was no evidence of left or right atrial appendage thrombus,  no obvious PFO or ASD by color flow Doppler or agitated saline injection. Mild aortic atherosclerosis noted. Patient tolerated the procedure well without any palpitations.  Consultations:  Cardiology  Discharge  Instructions  Discharge Instructions    Diet - low sodium heart healthy    Complete by:  As directed    Increase activity slowly    Complete by:  As directed      Allergies as of 04/13/2017      Reactions   Sulfa Antibiotics Other (See Comments)   Childhood allergy, can't remember   Lisinopril Cough      Medication List    TAKE these medications   ALPRAZolam 0.5 MG tablet Commonly known as:  XANAX Take 0.5 mg by mouth at bedtime.   amLODipine 10 MG tablet Commonly known as:  NORVASC Take 10 mg by mouth daily.   aspirin EC 81 MG tablet Take 1 tablet (81 mg total) by mouth daily.   atorvastatin 80 MG tablet Commonly known as:  LIPITOR Take 80 mg by mouth every evening.   clopidogrel 75 MG tablet Commonly known as:  PLAVIX TAKE 1 TABLET (75 MG TOTAL) BY MOUTH DAILY WITH BREAKFAST.   doxepin 10 MG capsule Commonly known as:  SINEQUAN Take 10 mg by mouth at bedtime.   freestyle lancets Check blood sugar four times per day. Before each meal and at bedtime   gabapentin 400 MG capsule Commonly known as:  NEURONTIN Take 400 mg by mouth 2 (two) times daily.   GARLIC PO Take 1 capsule by mouth daily.   glimepiride 4 MG tablet Commonly known as:  AMARYL Take 4 mg by mouth 2 (two) times daily.   hydrOXYzine 25 MG tablet Commonly known as:  ATARAX/VISTARIL Take 25 mg by mouth 3 (three) times daily as needed for anxiety.   isosorbide mononitrate 30 MG 24 hr tablet Commonly known as:  IMDUR TAKE 1 TABLET (30 MG TOTAL) BY MOUTH EVERY MORNING. What changed:  how much to take  how to take this  when to take this  additional instructions   JANUVIA 100 MG tablet Generic drug:  sitaGLIPtin Take 100 mg by mouth daily.   levothyroxine 25 MCG tablet Commonly known as:  SYNTHROID, LEVOTHROID TAKE 1 TABLET (25 MCG TOTAL) BY MOUTH DAILY BEFORE BREAKFAST.   losartan 100 MG tablet Commonly known as:  COZAAR Take 100 mg by mouth daily.   metoprolol tartrate 25 MG  tablet Commonly known as:  LOPRESSOR TAKE 1 TABLET (25 MG TOTAL) BY MOUTH 2 (TWO) TIMES DAILY.   multivitamin capsule Take 1 capsule by mouth daily. Senior vitamin   nitroGLYCERIN 0.4 MG SL tablet Commonly known as:  NITROSTAT Place 1 tablet (0.4 mg total) under the tongue every 5 (five) minutes as needed.   temazepam 15 MG capsule Commonly known as:  RESTORIL Take 30 mg by mouth at bedtime.   vitamin C 100 MG tablet Take 100 mg by mouth daily.   Vitamin D-3 1000 units Caps Take 1 capsule by mouth daily.   vitamin E 400 UNIT capsule Generic drug:  vitamin E Take 400 Units by mouth every morning.      Allergies  Allergen Reactions  . Sulfa Antibiotics Other (See Comments)    Childhood allergy, can't remember  . Lisinopril Cough   Follow-up Information    Jake Bathe, MD Follow up.   Specialty:  Cardiology Why:  Dr. Anne Fu' office will call you to schedule outpatient loop recorder implantation to monitor  heart rhythm due to your recent stroke. Contact information: [address] [place] [address] [phone]            The results of significant diagnostics from this hospitalization (including imaging, microbiology, ancillary and laboratory) are listed below for reference.    Significant Diagnostic Studies: Dg Chest 2 View  Result Date: 04/09/2017 CLINICAL DATA:  Dizziness. EXAM: CHEST  2 VIEW COMPARISON:  Radiograph of January 22, 2017. FINDINGS: The heart size and mediastinal contours are within normal limits. Both lungs are clear. No pneumothorax or pleural effusion is noted. Atherosclerosis of thoracic aorta is noted. The visualized skeletal structures are unremarkable. IMPRESSION: No active cardiopulmonary disease.  Aortic atherosclerosis. Electronically Signed   By: Lupita Raider, M.D.   On: 04/09/2017 16:45   Ct Head Wo Contrast  Result Date: 04/09/2017 CLINICAL DATA:  Dizziness since 04/07/2017 EXAM: CT HEAD WITHOUT CONTRAST  TECHNIQUE: Contiguous axial images were obtained from the base of the skull through the vertex without intravenous contrast. COMPARISON:  01/22/2017 FINDINGS: Brain: No evidence for acute hemorrhage, hydrocephalus, or abnormal extra-axial fluid collection. Old right PCA infarct again noted. 18 mm hypoattenuating focus identified in the left frontal hemispheric white matter, new in the interval. Lacunar infarct identified in the anterior corpus callosum with scattered periventricular deep white matter ischemic changes noted bilaterally. Vascular: No hyperdense vessel or unexpected calcification. Skull: No evidence for fracture. No worrisome lytic or sclerotic lesion. Sinuses/Orbits: The visualized paranasal sinuses and mastoid air cells are clear. Visualized portions of the globes and intraorbital fat are unremarkable. Other: None. IMPRESSION: 1. Interval development of an 18 mm hypoattenuating lesion posterior left frontal white matter. This may be an area of subacute to chronic ischemia. Edema from underlying mass lesion considered less likely. MRI without and with contrast recommended to further evaluate. 2. 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 Electronically Signed   By: Kennith Center M.D.   On: 04/09/2017 17:13   Mr Brain Wo Contrast  Result Date: 04/10/2017 CLINICAL DATA:  Dizziness and speech disturbance.  Abnormal head CT. EXAM: MRI HEAD WITHOUT CONTRAST TECHNIQUE: Multiplanar, multiecho pulse sequences of the brain and surrounding structures were obtained without intravenous contrast. COMPARISON:  CT 04/09/2017.  MRI 01/23/2017. FINDINGS: Brain: Multiple (approximately 20) foci of acute infarction in the left hemisphere within the middle cerebral artery territory, the largest being a 1.5 cm region visible on CT in the left frontoparietal white matter. Findings likely represent micro embolic infarctions. No other vascular  territory acute finding. Brainstem and cerebellum are normal. Old infarction in the right PCA territory as seen previously. No mass lesion, hemorrhage, hydrocephalus or extra-axial collection. Vascular: Major vessels at the base of the brain show flow. Skull and upper cervical spine: Negative Sinuses/Orbits: Clear/normal Other: None IMPRESSION: Multiple foci of acute infarction in the left MCA territory consistent with micro embolic infarctions. No significant swelling, mass effect or hemorrhage. Old infarction in the right PCA territory. Electronically Signed   By: Paulina Fusi M.D.   On: 04/10/2017 10:09    Microbiology: No results found for this or any previous visit (from the past 240 hour(s)).   Labs: Basic Metabolic Panel:  Recent Labs Lab 04/09/17 1600 04/10/17 0534 04/11/17 0453 04/12/17 1058  NA 135 142 139  --   K 3.4* 3.8 4.0  --   CL 102 113* 108  --   CO2 21* 25 26  --  GLUCOSE 328* 119* 137*  --   BUN 15 10 8   --   CREATININE 1.08* 0.93 0.88  --   CALCIUM 9.6 8.4* 8.5*  --   MG  --   --   --  1.4*   Liver Function Tests:  Recent Labs Lab 04/09/17 1600 04/10/17 0534  AST 30 14*  ALT 28 19  ALKPHOS 69 51  BILITOT 0.6 0.5  PROT 7.0 5.4*  ALBUMIN 4.0 3.1*   No results for input(s): LIPASE, AMYLASE in the last 168 hours. No results for input(s): AMMONIA in the last 168 hours. CBC:  Recent Labs Lab 04/09/17 1600 04/10/17 0534 04/11/17 0453  WBC 8.5 6.9 6.9  NEUTROABS 5.9  --   --   HGB 13.6 11.8* 12.2  HCT 39.3 34.4* 36.2  MCV 81.7 82.7 82.8  PLT 198 167 160   Cardiac Enzymes:  Recent Labs Lab 04/09/17 1600  TROPONINI <0.03   BNP: BNP (last 3 results) No results for input(s): BNP in the last 8760 hours.  ProBNP (last 3 results) No results for input(s): PROBNP in the last 8760 hours.  CBG:  Recent Labs Lab 04/12/17 1330 04/12/17 1651 04/12/17 2142 04/13/17 0720 04/13/17 1119  GLUCAP 121* 130* 207* 120* 160*        Signed:  HERNANDEZ ACOSTA,ESTELA  [facility] Pager: [phone] 04/13/2017, 11:43 AM

## 2017-04-13 NOTE — Progress Notes (Signed)
IV removed, WNL. D/C instructions given to pt. Verbalized understanding. Pt awaiting ride to transport home.  

## 2017-04-14 ENCOUNTER — Encounter (HOSPITAL_COMMUNITY): Payer: Self-pay | Admitting: Cardiology

## 2017-04-16 ENCOUNTER — Other Ambulatory Visit: Payer: Self-pay | Admitting: Cardiology

## 2017-04-16 MED ORDER — ISOSORBIDE MONONITRATE ER 30 MG PO TB24: 30.0000 mg | ORAL_TABLET | Freq: Every day | ORAL | 0 refills | Status: DC

## 2017-04-16 NOTE — Telephone Encounter (Signed)
Spoke with Glynda JaegerMelissa Tatum on Wednesday 04/14/17, she has been attempting to contact patient. Dayna Soberanes PA-C

## 2017-04-16 NOTE — Telephone Encounter (Signed)
°*  STAT* If patient is at the pharmacy, call can be transferred to refill team.   1. Which medications need to be refilled? (please list name of each medication and dose if known) Isosorbide  2. Which pharmacy/location (including street and city if local pharmacy) is medication to be sent to?[place] [phone]  3. Do they need a 30 day or 90 day supply? 90 and refills

## 2017-04-20 ENCOUNTER — Encounter: Payer: Self-pay | Admitting: *Deleted

## 2017-04-21 ENCOUNTER — Institutional Professional Consult (permissible substitution): Payer: Medicare Other | Admitting: Internal Medicine

## 2017-04-23 ENCOUNTER — Encounter: Payer: Self-pay | Admitting: Internal Medicine

## 2017-04-24 ENCOUNTER — Encounter (HOSPITAL_COMMUNITY): Payer: Self-pay | Admitting: Emergency Medicine

## 2017-04-24 ENCOUNTER — Emergency Department (HOSPITAL_COMMUNITY): Payer: Medicare HMO

## 2017-04-24 ENCOUNTER — Emergency Department (HOSPITAL_COMMUNITY)
Admission: EM | Admit: 2017-04-24 | Discharge: 2017-04-24 | Disposition: A | Discharge status: Home / Self Care | Payer: Medicare HMO | Attending: Emergency Medicine | Admitting: Emergency Medicine

## 2017-04-24 DIAGNOSIS — Z7984 Long term (current) use of oral hypoglycemic drugs: Secondary | ICD-10-CM | POA: Diagnosis not present

## 2017-04-24 DIAGNOSIS — R200 Anesthesia of skin: Secondary | ICD-10-CM | POA: Insufficient documentation

## 2017-04-24 DIAGNOSIS — I10 Essential (primary) hypertension: Secondary | ICD-10-CM | POA: Diagnosis not present

## 2017-04-24 DIAGNOSIS — I2510 Atherosclerotic heart disease of native coronary artery without angina pectoris: Secondary | ICD-10-CM | POA: Diagnosis not present

## 2017-04-24 DIAGNOSIS — Z79899 Other long term (current) drug therapy: Secondary | ICD-10-CM | POA: Insufficient documentation

## 2017-04-24 DIAGNOSIS — E039 Hypothyroidism, unspecified: Secondary | ICD-10-CM | POA: Insufficient documentation

## 2017-04-24 DIAGNOSIS — Z7982 Long term (current) use of aspirin: Secondary | ICD-10-CM | POA: Insufficient documentation

## 2017-04-24 DIAGNOSIS — E119 Type 2 diabetes mellitus without complications: Secondary | ICD-10-CM | POA: Diagnosis not present

## 2017-04-24 LAB — URINALYSIS, ROUTINE W REFLEX MICROSCOPIC
Bacteria, UA: NONE SEEN
Bilirubin Urine: NEGATIVE
Glucose, UA: NEGATIVE mg/dL
Hgb urine dipstick: NEGATIVE
Ketones, ur: NEGATIVE mg/dL
Nitrite: NEGATIVE
Protein, ur: NEGATIVE mg/dL
RBC / HPF: NONE SEEN RBC/hpf (ref 0–5)
Specific Gravity, Urine: 1.001 — ABNORMAL LOW (ref 1.005–1.030)
Squamous Epithelial / LPF: NONE SEEN
pH: 6 (ref 5.0–8.0)

## 2017-04-24 LAB — CBC WITH DIFFERENTIAL/PLATELET
Basophils Absolute: 0 10*3/uL (ref 0.0–0.1)
Basophils Relative: 0 %
Eosinophils Absolute: 0.2 10*3/uL (ref 0.0–0.7)
Eosinophils Relative: 3 %
HCT: 39.2 % (ref 36.0–46.0)
Hemoglobin: 13.1 g/dL (ref 12.0–15.0)
Lymphocytes Relative: 24 %
Lymphs Abs: 1.8 10*3/uL (ref 0.7–4.0)
MCH: 27.9 pg (ref 26.0–34.0)
MCHC: 33.4 g/dL (ref 30.0–36.0)
MCV: 83.4 fL (ref 78.0–100.0)
Monocytes Absolute: 0.7 10*3/uL (ref 0.1–1.0)
Monocytes Relative: 9 %
Neutro Abs: 4.9 10*3/uL (ref 1.7–7.7)
Neutrophils Relative %: 64 %
Platelets: 242 10*3/uL (ref 150–400)
RBC: 4.7 MIL/uL (ref 3.87–5.11)
RDW: 13.9 % (ref 11.5–15.5)
WBC: 7.7 10*3/uL (ref 4.0–10.5)

## 2017-04-24 LAB — BASIC METABOLIC PANEL
Anion gap: 10 (ref 5–15)
BUN: 19 mg/dL (ref 6–20)
CO2: 25 mmol/L (ref 22–32)
Calcium: 10 mg/dL (ref 8.9–10.3)
Chloride: 106 mmol/L (ref 101–111)
Creatinine, Ser: 0.99 mg/dL (ref 0.44–1.00)
GFR calc Af Amer: >60 mL/min (ref >60)
GFR calc non Af Amer: 58 mL/min — ABNORMAL LOW (ref >60)
Glucose, Bld: 168 mg/dL — ABNORMAL HIGH (ref 65–99)
Potassium: 4.2 mmol/L (ref 3.5–5.1)
Sodium: 141 mmol/L (ref 135–145)

## 2017-04-24 NOTE — ED Triage Notes (Signed)
Pt states she has been having numbness in left side of her face, left arm, and left leg since around noon today.  PT c/o of some stuttering and feeling weak.    Dr. Juleen ChinaKohut in room to evaluate pt during triage.

## 2017-04-24 NOTE — ED Notes (Signed)
Patient transported to CT 

## 2017-04-24 NOTE — ED Notes (Signed)
Patient walked to restroom to obtain urine specimen at this time.

## 2017-04-24 NOTE — ED Provider Notes (Signed)
[facility]-EMERGENCY DEPT Provider Note   CSN: [id number] Arrival date & time: 04/24/17  2027     History   Chief Complaint Chief Complaint  Patient presents with  . Numbness    HPI Shannon [name] is a 66 y.o. female.  HPI  65yF with numbness. Onset around 1200 today. Intermittent. L face, UE and LE. No acute weakness. Some stuttering speech initially but none since. No headache or other acute pain. Recent TIA/CVAs with admissions/ED visits.   Past Medical History:  Diagnosis Date  . Anxiety   . Arthritis   . Coronary artery disease    a. s/p PCI with DES to LAD in 2011  (LHC 03/2010: Mid LAD 70%, normal ejection fraction  >> PCI:  2.75 x 15 mm Promus DES to LAD); b. Nuclear Stress Test 05/29/14: No ischemia, EF 59%, Normal; c. NSTEMI 11/2014 >> patient declined LHC b/c of recent CVA  . Depression   . Diabetes mellitus without complication (HCC)   . Dyslipidemia 11/20/2014  . Fibromyalgia 1995  . H/O ischemic right PCA stroke 10/2014   a. Carotid US 10/24/14: No significant ICA stenosis bilaterally  . Hemiparesis and alteration of sensations as late effects of stroke (HCC) 06/05/2015  . Hx of echocardiogram    a. Echocardiogram 10/25/14: Moderate LVH, EF 50-55%, normal wall motion, grade 2 diastolic dysfunction, mild LAE, atrial septal lipomatous hypertrophy  . Hypertension   . Irritable bowel     Patient Active Problem List   Diagnosis Date Noted  . CVA (cerebrovascular accident) (HCC) 04/10/2017  . Dizziness 04/09/2017  . Hemiparesis and alteration of sensations as late effects of stroke (HCC) 06/05/2015  . Numbness 12/27/2014  . Visual disturbance 11/26/2014  . Dyslipidemia 11/20/2014  . Hypothyroidism 11/20/2014  . Pain   . Cerebral thrombosis with cerebral infarction (HCC) 11/19/2014  . Weakness   . NSTEMI (non-ST elevated myocardial infarction) (HCC) 11/17/2014  . Acute CVA -Dec 2015 10/24/2014  . DM type 2 (diabetes mellitus, type 2) (HCC) 10/24/2014  .  Accelerated hypertension 10/24/2014  . Atypical chest pain 05/16/2014  . CAD S/P LAD PCI July 2011 07/01/2013  . Hypertension, essential 07/01/2013  . Fibromyalgia 07/01/2013  . IBS (irritable bowel syndrome) 07/01/2013    Past Surgical History:  Procedure Laterality Date  . CHOLECYSTECTOMY    . CORONARY STENT PLACEMENT    . orif arm fracture Right    forearm  . TEE WITHOUT CARDIOVERSION N/A 04/12/2017   Procedure: TRANSESOPHAGEAL ECHOCARDIOGRAM (TEE) WITH PROPOFOL;  Surgeon: Jonelle Sidle, MD;  Location: [facility] ENDO SUITE;  Service: Cardiovascular;  Laterality: N/A;    OB History    Gravida Para Term Preterm AB Living   12 4 4   8      SAB TAB Ectopic Multiple Live Births                   Home Medications    Prior to Admission medications   Medication Sig Start Date End Date Taking? Authorizing Provider  levothyroxine (SYNTHROID, LEVOTHROID) 25 MCG tablet TAKE 1 TABLET (25 MCG TOTAL) BY MOUTH DAILY BEFORE BREAKFAST. 07/30/15  Yes Jake Bathe, MD  ALPRAZolam Prudy Feeler) 0.5 MG tablet Take 0.5 mg by mouth at bedtime.  03/07/15   [provider]  amLODipine (NORVASC) 10 MG tablet Take 10 mg by mouth daily. 09/11/16   [provider]  Ascorbic Acid (VITAMIN C) 100 MG tablet Take 100 mg by mouth daily.    [provider]  aspirin EC 81 MG tablet Take 1 tablet (81 mg total) by mouth daily. 11/27/14   Tereso Newcomer T, PA-C  atorvastatin (LIPITOR) 80 MG tablet Take 80 mg by mouth every evening.     [provider]  Cholecalciferol (VITAMIN D-3) 1000 UNITS CAPS Take 1 capsule by mouth daily.    [provider]  clopidogrel (PLAVIX) 75 MG tablet TAKE 1 TABLET (75 MG TOTAL) BY MOUTH DAILY WITH BREAKFAST. 02/19/17   Jake Bathe, MD  doxepin (SINEQUAN) 10 MG capsule Take 10 mg by mouth at bedtime.    [provider]  gabapentin (NEURONTIN) 400 MG capsule Take 400 mg by mouth 2 (two) times daily. 07/29/16   [provider]  GARLIC  PO Take 1 capsule by mouth daily.    [provider]  glimepiride (AMARYL) 4 MG tablet Take 4 mg by mouth 2 (two) times daily. 09/03/16   [provider]  hydrOXYzine (ATARAX/VISTARIL) 25 MG tablet Take 25 mg by mouth 3 (three) times daily as needed for anxiety.     [provider]  isosorbide mononitrate (IMDUR) 30 MG 24 hr tablet Take 1 tablet (30 mg total) by mouth daily. DUE APPT IN AUGUST, MAKE APPT FOR FURTHER REFILLS, CALL [phone] TO SCHEDULE 04/16/17   Jake Bathe, MD  JANUVIA 100 MG tablet Take 100 mg by mouth daily. 09/11/16   [provider]  Lancets (FREESTYLE) lancets Check blood sugar four times per day. Before each meal and at bedtime 07/19/14   Holland Commons A, NP  losartan (COZAAR) 100 MG tablet Take 100 mg by mouth daily. 07/29/16   [provider]  metoprolol tartrate (LOPRESSOR) 25 MG tablet TAKE 1 TABLET (25 MG TOTAL) BY MOUTH 2 (TWO) TIMES DAILY. 02/20/16   Jake Bathe, MD  Multiple Vitamin (MULTIVITAMIN) capsule Take 1 capsule by mouth daily. Senior vitamin    [provider]  nitroGLYCERIN (NITROSTAT) 0.4 MG SL tablet Place 1 tablet (0.4 mg total) under the tongue every 5 (five) minutes as needed. 11/27/14   Tereso Newcomer T, PA-C  temazepam (RESTORIL) 15 MG capsule Take 30 mg by mouth at bedtime.     [provider]  vitamin E (VITAMIN E) 400 UNIT capsule Take 400 Units by mouth every morning.    [provider]    Family History Family History  Problem Relation Age of Onset  . Cancer Mother   . Diabetes Mother   . Hypertension Mother   . Heart failure Mother   . CAD Mother   . Heart attack Mother   . Cancer Father   . Diabetes Father   . Hypertension Father   . Heart failure Father   . CAD Father   . Heart attack Father   . Stroke Father   . CAD Brother   . Diabetes Brother   . Hypertension Brother   . CAD Sister   . Diabetes Sister   . Hypertension Sister   . Heart attack Sister     . Heart attack Brother   . Stroke Maternal Aunt     Social History Social History  Substance Use Topics  . Smoking status: Never Smoker  . Smokeless tobacco: Never Used  . Alcohol use No     Allergies   Insulins; Sulfa antibiotics; and Lisinopril   Review of Systems Review of Systems  All systems reviewed and negative, other than as noted in HPI.  Physical Exam Updated Vital Signs BP (!) 153/82  Pulse 85   Temp 98.5 F (36.9 C) (Oral)   Resp 18   Ht 5\' 4"  (1.626 m)   Wt 70.3 kg (155 lb)   SpO2 99%   BMI 26.61 kg/m   Physical Exam  Constitutional: She is oriented to person, place, and time. She appears well-developed and well-nourished. No distress.  HENT:  Head: Normocephalic and atraumatic.  Eyes: Conjunctivae are normal. Right eye exhibits no discharge. Left eye exhibits no discharge.  Neck: Neck supple.  Cardiovascular: Normal rate, regular rhythm and normal heart sounds.  Exam reveals no gallop and no friction rub.   No murmur heard. Pulmonary/Chest: Effort normal and breath sounds normal. No respiratory distress.  Abdominal: Soft. She exhibits no distension. There is no tenderness.  Musculoskeletal: She exhibits no edema or tenderness.  Neurological: She is alert and oriented to person, place, and time. She exhibits normal muscle tone. Coordination normal.  Skin: Skin is warm and dry.  Psychiatric: She has a normal mood and affect. Her behavior is normal. Thought content normal.  Nursing note and vitals reviewed.    ED Treatments / Results  Labs (all labs ordered are listed, but only abnormal results are displayed) Labs Reviewed  BASIC METABOLIC PANEL - Abnormal; Notable for the following:       Result Value   Glucose, Bld 168 (*)    GFR calc non Af Amer 58 (*)    All other components within normal limits  URINALYSIS, ROUTINE W REFLEX MICROSCOPIC - Abnormal; Notable for the following:    Color, Urine COLORLESS (*)    Specific Gravity, Urine  1.001 (*)    Leukocytes, UA SMALL (*)    All other components within normal limits  CBC WITH DIFFERENTIAL/PLATELET    EKG  EKG Interpretation  Date/Time:  Saturday April 24 2017 20:33:17 EDT Ventricular Rate:  76 PR Interval:    QRS Duration: 99 QT Interval:  421 QTC Calculation: 474 R Axis:   70 Text Interpretation:  Sinus rhythm No significant change since 04/09/2017 Confirmed by Geoffery LyonseLo, Douglas ([id number]) on 04/26/2017 5:32:14 PM       Radiology Ct Head Wo Contrast  Result Date: 04/24/2017 CLINICAL DATA:  Numbness in left side of face, left arm, and left leg. EXAM: CT HEAD WITHOUT CONTRAST TECHNIQUE: Contiguous axial images were obtained from the base of the skull through the vertex without intravenous contrast. COMPARISON:  April 09, 2017 FINDINGS: Brain: No subdural, epidural, or subarachnoid hemorrhage. Cerebellum, brainstem, and basal cisterns are normal. An infarct in the right occipital lobe is stable. White matter changes in the left corona radiata and less conspicuous in the interval. A lacunar infarct adjacent to the frontal horn of the right lateral ventricle on image 12 is stable. No acute white matter changes are seen. No acute cortical ischemia or infarct. No mass effect or midline shift. Vascular: Calcified atherosclerosis is seen in the intracranial carotid arteries. Skull: Normal. Negative for fracture or focal lesion. Sinuses/Orbits: No acute finding. Other: None. IMPRESSION: 1. Chronic changes as above with no acute intracranial abnormality. No cause for the patient's acute symptoms seen. Electronically Signed   By: Gerome Samavid  Williams III M.D   On: 04/24/2017 21:23    Procedures Procedures (including critical care time)  Medications Ordered in ED Medications - No data to display   Initial Impression / Assessment and Plan / ED Course  I have reviewed the triage vital signs and the nursing notes.  Pertinent labs & imaging results that were available during  my care of the  patient were reviewed by me and considered in my medical decision making (see chart for details).     65yF with numerous complaints, but primarily L sided numbness. Recent admits this year for stroke, most recently this month. MRI with multiple infarcts. Had ECHO in March and TEE in June without source of emboli. Carotid Dopplers in March. On ASA/plavix/statin. CT head today w/o acute findings. No new findings on exam today. Scheduled for loop recorder with cards. I'm not sure what additional utility there is in admitting her again at this time with such a short interval since last testing. Outpt neurology and cards FU.    Final Clinical Impressions(s) / ED Diagnoses   Final diagnoses:  Numbness    New Prescriptions New Prescriptions   No medications on file     Raeford Razor, MD 05/07/17 [phone]

## 2017-05-03 ENCOUNTER — Ambulatory Visit (INDEPENDENT_AMBULATORY_CARE_PROVIDER_SITE_OTHER): Payer: Medicare HMO | Admitting: Nurse Practitioner

## 2017-05-03 ENCOUNTER — Encounter: Payer: Self-pay | Admitting: Nurse Practitioner

## 2017-05-03 VITALS — BP 134/74 | HR 70 | Ht 64.0 in | Wt 150.0 lb

## 2017-05-03 DIAGNOSIS — I10 Essential (primary) hypertension: Secondary | ICD-10-CM

## 2017-05-03 DIAGNOSIS — E785 Hyperlipidemia, unspecified: Secondary | ICD-10-CM

## 2017-05-03 DIAGNOSIS — I6330 Cerebral infarction due to thrombosis of unspecified cerebral artery: Secondary | ICD-10-CM | POA: Diagnosis not present

## 2017-05-03 DIAGNOSIS — H539 Unspecified visual disturbance: Secondary | ICD-10-CM | POA: Diagnosis not present

## 2017-05-03 MED ORDER — ATORVASTATIN CALCIUM 80 MG PO TABS: 80.0000 mg | ORAL_TABLET | Freq: Every day | ORAL | 0 refills | Status: DC

## 2017-05-03 NOTE — Patient Instructions (Addendum)
Stressed the importance of management of risk factors to prevent further stroke Continue Plavix and Aspirin for secondary stroke prevention Maintain strict control of hypertension with blood pressure goal below 130/90, today's [phone] continue antihypertensive medications Control of diabetes with hemoglobin A1c below 6.5 followed by primary care most  continue diabetic medications Cholesterol with LDL cholesterol less than 70, followed by primary care,  Continue statin drugs refilled until seen by PCP Patient advised to follow-up with her primary care for stroke risk management/labs Continue rehab  use cane , eat healthy diet with whole grains,  fresh fruits and vegetables Please make appt for loop recorder placement Dr. Anne FuSkains Discussed risk for recurrent stroke/ TIA and answered additional questions Follow up 2  Months next with Dr. Roda ShuttersXu stroke physician Referred for [facility] stroke prevention study

## 2017-05-03 NOTE — Progress Notes (Signed)
[facility]  PATIENT: Shannon [name] DOB: 04/04/1951   REASON FOR VISIT:  follow-up for multiple TIA/stroke HISTORY FROM: Patient alone at visit     HISTORY OF PRESENT ILLNESS:UPDATE 06/25/2018CM Shannon [name], 66 year old female returns for follow-up with a history of multiple strokes in the past. She had another admission on 04/09/2017 to [facility] until 6/5 18  for 3-4 day episode of dizziness and blurry vision . MRI of the brain multiple foci of acute infarction in the left MCA territory consistent with micro-embolic infarctions no significant swelling mass effect or hemorrhage. Old infarction in the right PCA territory.TEE completed no source of cardioembolic disease seen. Case discussed with Dr. Otelia LimesLindzen neuro for recommendations.  Cardiology Dr. Anne FuSkains  is to arrange implantation of loop recorder however patient has not called to set up an appointment. Patient has had multiple admissions for stroke workup in the past year. She remains on aspirin and Plavix for secondary stroke prevention. Her Lipitor is at 80 mg daily. She has not seen her primary care since hospital discharge. Her blood sugars continue to be elevated. She is currently receiving rehabilitation, physical therapy and occupational therapy. She ambulates with a quad cane denies any falls. Blood pressure the office today 134/74 She returns for reevaluation   01/29/17 CM Ms Shea [name], 66 year old female returns for follow-up with history of multiple admissions to the emergency room for visual problems sensory changes but most recent admission to [facility] on 01/22/2017 was for lower extremity weakness associated with left visual difficulty.  She had a ER visit to  [facility]2016 at that time she was evaluated for worsening troubles seeing in the left eye and tingling sensations on the left side. CT at that time without any acute finding MRI revealed mild recurrent acute watershed territory  infarcts on the left. Patient had all ready had a full stroke workup in August including CTA of head and neck TEE carotid Dopplers etc. hypercoagulable panel was ordered and all returned within normal results. . Admission to [facility] on 01/22/2017 for complaints of increasing weakness left lower extremity associated with increasing visual problems on the left. Patient has prior history of strokes hypertension history of coronary disease and uncontrolled diabetes and hyperlipidemia MRI of the brain no acute findings carotid Doppler negative significant stenosis 2Decho 60-65.% LDL 105 hemoglobin A1c 8.6 Her Lipitor was increased to 80 mg daily She remains on aspirin and Plavix for secondary stroke prevention. She has minimal bruising or bleeding Blood pressure in the office today 105/68. She claims she had recent changes to her diabetic regimen but her blood sugars consistently stay around 200. She has never had any diabetic teaching according to the patient. She has little  understanding of her chronic diseases. She returns for reevaluation. She has not had further stroke or TIA symptoms her most recent admission one week ago. She is scheduled for loop recorder placement.   HISTORY 06/05/15 KWMs. Shea [name] is a 66 year old right-handed white female with a history of a right posterior cerebral artery distribution stroke associated with some left-sided numbness and a dense left homonymous visual field deficit. The patient has been under quite a bit of stress recently. Her husband died one month ago, and one of her grandchildren has a serious medical illness, and may die within the next several weeks. The patient has been under stress from her daughters. Within the last 3-4 days, she has been noting episodes of near-syncope, visual dimming, feeling generally  poor. She will feel weak all over, no focal weakness is noted. The patient has not actually fainted. She denies palpitations of the heart. She denies that  the episodes of visual dimming are related to any of her medications. She will lie down, and the patient still feels poorly for several hours. She has occasional headaches, she denies any slurred speech or loss of speech. She is followed by psychiatry through Dr. Donell Beers. On several occasions, she has noted that her blood pressure has been either too low or too high with the above events. The systolic pressures may go down into the 70 range. She comes into the office today for an evaluation.  REVIEW OF SYSTEMS: Full 14 system review of systems performed and notable only for those listed, all others are neg:  Constitutional: Fatigue Cardiovascular: neg Ear/Nose/Throat: neg  Skin: neg Eyes: Blurred vision Respiratory: neg Gastroitestinal: neg  Hematology/Lymphatic: neg  Endocrine: neg Musculoskeletal: Joint pain, walking difficulty Allergy/Immunology: neg Neurological: neg Psychiatric: Depression and anxiety  Sleep : neg   ALLERGIES: Allergies  Allergen Reactions  . Insulins Other (See Comments)    Causes severe diarrhea  . Sulfa Antibiotics Other (See Comments)    Childhood allergy, can't remember  . Lisinopril Cough    HOME MEDICATIONS: Outpatient Medications Prior to Visit  Medication Sig Dispense Refill  . amLODipine (NORVASC) 10 MG tablet Take 10 mg by mouth daily.    . Ascorbic Acid (VITAMIN C) 100 MG tablet Take 100 mg by mouth daily.    Marland Kitchen aspirin EC 81 MG tablet Take 1 tablet (81 mg total) by mouth daily.    . Cholecalciferol (VITAMIN D-3) 1000 UNITS CAPS Take 1 capsule by mouth daily.    . clopidogrel (PLAVIX) 75 MG tablet TAKE 1 TABLET (75 MG TOTAL) BY MOUTH DAILY WITH BREAKFAST. 90 tablet 1  . gabapentin (NEURONTIN) 400 MG capsule Take 400 mg by mouth 2 (two) times daily.    Marland Kitchen GARLIC PO Take 1 capsule by mouth daily.    Marland Kitchen glimepiride (AMARYL) 4 MG tablet Take 4 mg by mouth 2 (two) times daily.    . hydrOXYzine (ATARAX/VISTARIL) 25 MG tablet Take 25 mg by mouth 3  (three) times daily as needed for anxiety.     Marland Kitchen ibuprofen (ADVIL,MOTRIN) 200 MG tablet Take 200-400 mg by mouth every 6 (six) hours as needed.    . isosorbide mononitrate (IMDUR) 30 MG 24 hr tablet Take 1 tablet (30 mg total) by mouth daily. DUE APPT IN AUGUST, MAKE APPT FOR FURTHER REFILLS, CALL [phone] TO SCHEDULE 90 tablet 0  . JANUVIA 100 MG tablet Take 100 mg by mouth daily.    . Lancets (FREESTYLE) lancets Check blood sugar four times per day. Before each meal and at bedtime 100 each 12  . levothyroxine (SYNTHROID, LEVOTHROID) 25 MCG tablet TAKE 1 TABLET (25 MCG TOTAL) BY MOUTH DAILY BEFORE BREAKFAST. 30 tablet 1  . losartan (COZAAR) 100 MG tablet Take 100 mg by mouth daily.    . metoprolol tartrate (LOPRESSOR) 25 MG tablet TAKE 1 TABLET (25 MG TOTAL) BY MOUTH 2 (TWO) TIMES DAILY. 180 tablet 1  . Multiple Vitamin (MULTIVITAMIN) capsule Take 1 capsule by mouth daily. Senior vitamin    . nitroGLYCERIN (NITROSTAT) 0.4 MG SL tablet Place 1 tablet (0.4 mg total) under the tongue every 5 (five) minutes as needed. 25 tablet 3  . temazepam (RESTORIL) 15 MG capsule Take 30 mg by mouth at bedtime.     . vitamin E (  VITAMIN E) 400 UNIT capsule Take 400 Units by mouth every morning.    Marland Kitchen ALPRAZolam (XANAX) 0.5 MG tablet Take 0.5 mg by mouth at bedtime.     Marland Kitchen atorvastatin (LIPITOR) 40 MG tablet Take 40 mg by mouth daily.     No facility-administered medications prior to visit.     PAST MEDICAL HISTORY: Past Medical History:  Diagnosis Date  . Anxiety   . Arthritis   . Coronary artery disease    a. s/p PCI with DES to LAD in 2011  (LHC 03/2010: Mid LAD 70%, normal ejection fraction  >> PCI:  2.75 x 15 mm Promus DES to LAD); b. Nuclear Stress Test 05/29/14: No ischemia, EF 59%, Normal; c. NSTEMI 11/2014 >> patient declined LHC b/c of recent CVA  . Depression   . Diabetes mellitus without complication (HCC)   . Dyslipidemia 11/20/2014  . Fibromyalgia 1995  . H/O ischemic right PCA stroke 10/2014     a. Carotid US 10/24/14: No significant ICA stenosis bilaterally  . Hemiparesis and alteration of sensations as late effects of stroke (HCC) 06/05/2015  . Hx of echocardiogram    a. Echocardiogram 10/25/14: Moderate LVH, EF 50-55%, normal wall motion, grade 2 diastolic dysfunction, mild LAE, atrial septal lipomatous hypertrophy  . Hypertension   . Irritable bowel     PAST SURGICAL HISTORY: Past Surgical History:  Procedure Laterality Date  . CHOLECYSTECTOMY    . CORONARY STENT PLACEMENT    . orif arm fracture Right    forearm  . TEE WITHOUT CARDIOVERSION N/A 04/12/2017   Procedure: TRANSESOPHAGEAL ECHOCARDIOGRAM (TEE) WITH PROPOFOL;  Surgeon: Jonelle Sidle, MD;  Location: [facility];  Service: Cardiovascular;  Laterality: N/A;    FAMILY HISTORY: Family History  Problem Relation Age of Onset  . Cancer Mother   . Diabetes Mother   . Hypertension Mother   . Heart failure Mother   . CAD Mother   . Heart attack Mother   . Cancer Father   . Diabetes Father   . Hypertension Father   . Heart failure Father   . CAD Father   . Heart attack Father   . Stroke Father   . CAD Brother   . Diabetes Brother   . Hypertension Brother   . CAD Sister   . Diabetes Sister   . Hypertension Sister   . Heart attack Sister   . Heart attack Brother   . Stroke Maternal Aunt     SOCIAL HISTORY: Social History   Social History  . Marital status: Married    Spouse name: N/A  . Number of children: 4  . Years of education: N/A   Occupational History  . retired    Social History Main Topics  . Smoking status: Never Smoker  . Smokeless tobacco: Never Used  . Alcohol use No  . Drug use: No  . Sexual activity: Not on file   Other Topics Concern  . Not on file   Social History Narrative   Patient is right handed.   Patient drinks about 1 coke daily.   Separated from husband. 03-29-15    Living alone but close to family.       PHYSICAL EXAM  Vitals:   05/03/17 1308  BP:  134/74  Pulse: 70  Weight: 150 lb (68 kg)  Height: 5\' 4"  (1.626 m)   Body mass index is 25.75 kg/m.  Generalized: Well developed, in no acute distress  Head: normocephalic and atraumatic,. Oropharynx benign  Neck: Supple, no carotid bruits  Cardiac: Regular rate rhythm, no murmur  Musculoskeletal: No deformity   Neurological examination   Mentation: Alert oriented to time, place, history taking. Attention span and concentration appropriate. Recent and remote memory intact.  Follows all commands speech and language fluent.   Cranial nerve II-XII: .Pupils were equal round reactive to light extraocular movements were full, visual field are notable for a dense left homonymous visual field deficit. Facial sensation and strength were normal. hearing was intact to finger rubbing bilaterally. Uvula tongue midline. head turning and shoulder shrug were normal and symmetric.Tongue protrusion into cheek strength was normal. Motor: normal bulk and tone, full strength in the BUE, BLE, fine finger movements normal, no pronator drift.  Sensory: normal and symmetric to light touch, decreased pinprick to both feet and  Vibration normal  Coordination: finger-nose-finger, heel-to-shin bilaterally, no dysmetria, no tremor Reflexes: 1+ upper lower and symmetric , plantar responses were flexor bilaterally. Gait and Station: Rising up from seated position without assistance, wide based  stance,  moderate stride, ambulates with quad cane, no difficulty with turns .Tandem gait was not tested. Romberg negative  DIAGNOSTIC DATA (LABS, IMAGING, TESTING) - I reviewed patient records, labs, notes, testing and imaging myself where available.  Lab Results  Component Value Date   WBC 7.7 04/24/2017   HGB 13.1 04/24/2017   HCT 39.2 04/24/2017   MCV 83.4 04/24/2017   PLT 242 04/24/2017      Component Value Date/Time   NA 141 04/24/2017 2040   K 4.2 04/24/2017 2040   CL 106 04/24/2017 2040   CO2 25 04/24/2017  2040   GLUCOSE 168 (H) 04/24/2017 2040   BUN 19 04/24/2017 2040   CREATININE 0.99 04/24/2017 2040   CREATININE 0.74 03/14/2014 1554   CALCIUM 10.0 04/24/2017 2040   PROT 5.4 (L) 04/10/2017 0534   ALBUMIN 3.1 (L) 04/10/2017 0534   AST 14 (L) 04/10/2017 0534   ALT 19 04/10/2017 0534   ALKPHOS 51 04/10/2017 0534   BILITOT 0.5 04/10/2017 0534   GFRNONAA 58 (L) 04/24/2017 2040   GFRNONAA 86 03/14/2014 1554   GFRAA >60 04/24/2017 2040   GFRAA >89 03/14/2014 1554         ASSESSMENT AND PLAN  66 y.o. year old female  has a past medical history of  Admissions for strokelike symptoms in August 2017,  09/25/2016  and 01/22/2017 , Chronic left, homonymous hemianopsia,  uncontrolled diabetes, essential hypertension, hyperlipidemia,  here for stroke follow up. She was readmitted to [facility] on 06/01/2018MRI of the brain multiple foci of acute infarction in the left MCA territory consistent with micro-embolic infarctions no significant swelling mass effect or hemorrhage.  01/24/17  carotid Doppler negative significant stenosis 2Decho 60-65.% LDL 105 hemoglobin A1c 8.6 Her Lipitor was increased to 80 mg daily the patient is not taking this because she has not seen her primary care for refills   PLAN: Stressed the importance of management of risk factors to prevent further stroke Continue Plavix and Aspirin for secondary stroke prevention Maintain strict control of hypertension with blood pressure goal below 130/90, today's [phone] continue antihypertensive medications Control of diabetes with hemoglobin A1c below 6.5 followed by primary care most  continue diabetic medications Cholesterol with LDL cholesterol less than 70, followed by primary care,  Continue statin drugs I will refill until  Seen by PCP  Patient advised to follow-up with her primary care for stroke risk management/labs Continue rehab,  use cane , eat healthy diet  with whole grains,  fresh fruits and vegetables Please make  appt for loop recorder placement Dr. Anne Fu Discussed risk for recurrent stroke/ TIA and answered additional questions Follow up 2  Months next with Dr. Roda Shutters stroke physician, she has not been seen by either Dr. Roda Shutters or Dr. Pearlean Brownie with her multiple admissions Referred for [facility] stroke prevention study This was a  visit requiring 30 minutes and medical decision making of high complexity with extensive review of history, hospital chart, 4 separate admissions for stroke counseling and answering questions. Patient was advised to keep her follow-up appointment for loop recorder placement. She would probably benefit from diabetic teaching as well Nilda Riggs, [facility], [facility], APRN  [facility] [address] [place], [place] [address] [phone]

## 2017-05-05 NOTE — Progress Notes (Signed)
I reviewed above note and agree with the assessment and plan.  Marvel PlanJindong Darlina Mccaughey, MD PhD Stroke Neurology 05/05/2017 7:12 AM

## 2017-05-19 ENCOUNTER — Other Ambulatory Visit: Payer: Self-pay | Admitting: Cardiology

## 2017-05-19 ENCOUNTER — Telehealth: Payer: Self-pay | Admitting: Cardiology

## 2017-05-19 NOTE — Telephone Encounter (Signed)
I tried to reach the patient to find out what dose of this medication she is taking. Her home number did not go through and her cell phone was answered by a man who stated that I had the wrong number. I verified that I had called the correct numbers.

## 2017-05-19 NOTE — Telephone Encounter (Signed)
New Message:    Please call,pt wants to see if Dr Anne FuSkains will call in some of her diabetic medicine for her. Her doctor is not in and she needs her medicine. She is scared she will have another stroke if she does not take this medicine.

## 2017-05-19 NOTE — Telephone Encounter (Signed)
Tried to call patient back. Phone number provided did not work.   Called patient's PCP, Dr. Thomasene LotGreen's office. Spoke with Dr. Thomasene LotGreen's nurse Lew Dawesitter. She explained that patient needs to call them for refills. Their office has tried to contact her several time, but patient's number is not working. If patient calls back we will let her know she needs to call Dr. Thomasene LotGreen's office.

## 2017-05-19 NOTE — Telephone Encounter (Signed)
°*  STAT* If patient is at the pharmacy, call can be transferred to refill team.   1. Which medications need to be refilled? (please list name of each medication and dose if known)Losartan  2. Which pharmacy/location (including street and city if local pharmacy) is medication to be sent to?[facility] [address] [place]  3. Do they need a 30 day or 90 day supply? 30 and refills

## 2017-05-20 NOTE — Telephone Encounter (Signed)
I have tried again to reach the patient at the call back number provided. Phone number is not working.

## 2017-05-25 ENCOUNTER — Ambulatory Visit (INDEPENDENT_AMBULATORY_CARE_PROVIDER_SITE_OTHER): Payer: Medicare HMO | Admitting: Internal Medicine

## 2017-05-25 ENCOUNTER — Encounter: Payer: Self-pay | Admitting: Internal Medicine

## 2017-05-25 VITALS — BP 130/72 | HR 86 | Ht 64.0 in | Wt 146.0 lb

## 2017-05-25 DIAGNOSIS — I639 Cerebral infarction, unspecified: Secondary | ICD-10-CM | POA: Diagnosis not present

## 2017-05-25 NOTE — Progress Notes (Signed)
    ELECTROPHYSIOLOGY CONSULT NOTE  Patient ID: Shannon [name], MRN: [id number], DOB/AGE: 02/23/1951 66 y.o. Admit date: (Not on file) Date of Consult: 05/25/2017  Primary Physician: Green, Edwin, MD Primary Cardiologist: SMc   Manha Bonita Bartolotta is being seen today for the evaluation of ILR  at the request of  Dr S McDowell.and Skains   HPI Shannon [name] is a 66 y.o. female referred for consideration of implantable loop recorder  She has a history of ischemic heart disease with prior stenting of her LAD in 2011 a stroke 2015 and a recurrent stroke 2018.  She has a history of multiple strokes. These occurred despite ongoing therapy with aspirin and Plavix.  Echocardiogram 3/18 demonstrated normal LV function on presentation with rather nondescript symptoms 6/18 she underwent MRI scanning showing "multiple foci of acute infarction in the left MCA territory "  Her telemetry at that time was  personally reviewed and demonstrated nonsustained ventricular tachycardia and nonsustained atrial tachycardia-possibly MAT as there is moderate irregularity but  Mostly detectable P waves  She denies chest pain. She does have chronic shortness of breath. She has some edema. She takes amlodipine.  She has had dizziness since the last stroke   Past Medical History:  Diagnosis Date  . Anxiety   . Arthritis   . Coronary artery disease    a. s/p PCI with DES to LAD in 2011  (LHC 03/2010: Mid LAD 70%, normal ejection fraction  >> PCI:  2.75 x 15 mm Promus DES to LAD); b. Nuclear Stress Test 05/29/14: No ischemia, EF 59%, Normal; c. NSTEMI 11/2014 >> patient declined LHC b/c of recent CVA  . Depression   . Diabetes mellitus without complication (HCC)   . Dyslipidemia 11/20/2014  . Fibromyalgia 1995  . H/O ischemic right PCA stroke 10/2014   a. Carotid US 10/24/14: No significant ICA stenosis bilaterally  . Hemiparesis and alteration of sensations as late effects of stroke (HCC) 06/05/2015  . Hx  of echocardiogram    a. Echocardiogram 10/25/14: Moderate LVH, EF 50-55%, normal wall motion, grade 2 diastolic dysfunction, mild LAE, atrial septal lipomatous hypertrophy  . Hypertension   . Irritable bowel       Surgical History:  Past Surgical History:  Procedure Laterality Date  . CHOLECYSTECTOMY    . CORONARY STENT PLACEMENT    . orif arm fracture Right    forearm  . TEE WITHOUT CARDIOVERSION N/A 04/12/2017   Procedure: TRANSESOPHAGEAL ECHOCARDIOGRAM (TEE) WITH PROPOFOL;  Surgeon: McDowell, Samuel G, MD;  Location: [facility];  Service: Cardiovascular;  Laterality: N/A;     Home Meds: Prior to Admission medications   Medication Sig Start Date End Date Taking? Authorizing Provider  ALPRAZolam (XANAX) 0.5 MG tablet Take 0.5 mg by mouth at bedtime.  03/07/15   [provider]  amLODipine (NORVASC) 10 MG tablet Take 10 mg by mouth daily. 09/11/16   [provider]  Ascorbic Acid (VITAMIN C) 100 MG tablet Take 100 mg by mouth daily.    [provider]  aspirin EC 81 MG tablet Take 1 tablet (81 mg total) by mouth daily. 11/27/14   Weaver, Scott T, PA-C  atorvastatin (LIPITOR) 80 MG tablet Take 1 tablet (80 mg total) by mouth daily. 05/03/17   Martin, Nancy Carolyn, NP  Cholecalciferol (VITAMIN D-3) 1000 UNITS CAPS Take 1 capsule by mouth daily.    [provider]  clopidogrel (PLAVIX) 75 MG tablet TAKE 1 TABLET (75 MG TOTAL) BY   MOUTH DAILY WITH BREAKFAST. 02/19/17   Skains, Mark C, MD  gabapentin (NEURONTIN) 400 MG capsule Take 400 mg by mouth 2 (two) times daily. 07/29/16   [provider]  GARLIC PO Take 1 capsule by mouth daily.    [provider]  glimepiride (AMARYL) 4 MG tablet Take 4 mg by mouth 2 (two) times daily. 09/03/16   [provider]  hydrOXYzine (ATARAX/VISTARIL) 25 MG tablet Take 25 mg by mouth 3 (three) times daily as needed for anxiety.     [provider]  ibuprofen (ADVIL,MOTRIN) 200 MG tablet Take  200-400 mg by mouth every 6 (six) hours as needed.    [provider]  isosorbide mononitrate (IMDUR) 30 MG 24 hr tablet Take 1 tablet (30 mg total) by mouth daily. DUE APPT IN AUGUST, MAKE APPT FOR FURTHER REFILLS, CALL [phone] TO SCHEDULE 04/16/17   Skains, Mark C, MD  JANUVIA 100 MG tablet Take 100 mg by mouth daily. 09/11/16   [provider]  Lancets (FREESTYLE) lancets Check blood sugar four times per day. Before each meal and at bedtime 07/19/14   Keck, Valerie A, NP  levothyroxine (SYNTHROID, LEVOTHROID) 25 MCG tablet TAKE 1 TABLET (25 MCG TOTAL) BY MOUTH DAILY BEFORE BREAKFAST. 07/30/15   Skains, Mark C, MD  losartan (COZAAR) 100 MG tablet Take 100 mg by mouth daily. 07/29/16   [provider]  metoprolol tartrate (LOPRESSOR) 25 MG tablet TAKE 1 TABLET (25 MG TOTAL) BY MOUTH 2 (TWO) TIMES DAILY. 02/20/16   Skains, Mark C, MD  Multiple Vitamin (MULTIVITAMIN) capsule Take 1 capsule by mouth daily. Senior vitamin    [provider]  nitroGLYCERIN (NITROSTAT) 0.4 MG SL tablet Place 1 tablet (0.4 mg total) under the tongue every 5 (five) minutes as needed. 11/27/14   Weaver, Scott T, PA-C  temazepam (RESTORIL) 15 MG capsule Take 30 mg by mouth at bedtime.     [provider]  vitamin E (VITAMIN E) 400 UNIT capsule Take 400 Units by mouth every morning.    [provider]    Allergies:  Allergies  Allergen Reactions  . Insulins Other (See Comments)    Causes severe diarrhea  . Sulfa Antibiotics Other (See Comments)    Childhood allergy, can't remember  . Lisinopril Cough    Social History   Social History  . Marital status: Married    Spouse name: N/A  . Number of children: 4  . Years of education: N/A   Occupational History  . retired    Social History Main Topics  . Smoking status: Never Smoker  . Smokeless tobacco: Never Used  . Alcohol use No  . Drug use: No  . Sexual activity: Not on file   Other Topics Concern  .  Not on file   Social History Narrative   Patient is right handed.   Patient drinks about 1 coke daily.   Separated from husband. 03-29-15    Living alone but close to family.       Family History  Problem Relation Age of Onset  . Cancer Mother   . Diabetes Mother   . Hypertension Mother   . Heart failure Mother   . CAD Mother   . Heart attack Mother   . Cancer Father   . Diabetes Father   . Hypertension Father   . Heart failure Father   . CAD Father   . Heart attack Father   . Stroke Father   . CAD   Brother   . Diabetes Brother   . Hypertension Brother   . CAD Sister   . Diabetes Sister   . Hypertension Sister   . Heart attack Sister   . Heart attack Brother   . Stroke Maternal Aunt      ROS:  Please see the history of present illness.     All other systems reviewed and negative.    Physical Exam:  Blood pressure 130/72, pulse 86, height 5' 4" (1.626 m), weight 146 lb (66.2 kg), SpO2 98 %. General: Well developed, well nourishedand somewhat disheveled female in no acute distress. Head: Normocephalic, atraumatic, sclera non-icteric, no xanthomas, nares are without discharge. EENT: normal  Lymph Nodes:  none Neck: Negative for carotid bruits. JVD not elevated. Back:without scoliosis kyphosis*  Lungs: Clear bilaterally to auscultation without wheezes, rales, or rhonchi. Breathing is unlabored. Heart: RRR with S1 S2. No   murmur . No rubs, or gallops appreciated. Abdomen: Soft, non-tender, non-distended with normoactive bowel sounds. No hepatomegaly. No rebound/guarding. No obvious abdominal masses. Msk:  Strength and tone appear normal for age. Extremities: No clubbing or cyanosis. tr edema.  Distal pedal pulses are 2+ and equal bilaterally. Skin: Warm and Dry Neuro: Alert and oriented X 3. CN III-XII intact Grossly normal sensory and motor function . Psych:  Responds to questions appropriately with a flat affect.      Labs: Cardiac Enzymes No results for  input(s): CKTOTAL, CKMB, TROPONINI in the last 72 hours. CBC Lab Results  Component Value Date   WBC 7.7 04/24/2017   HGB 13.1 04/24/2017   HCT 39.2 04/24/2017   MCV 83.4 04/24/2017   PLT 242 04/24/2017   PROTIME: No results for input(s): LABPROT, INR in the last 72 hours. Chemistry No results for input(s): NA, K, CL, CO2, BUN, CREATININE, CALCIUM, PROT, BILITOT, ALKPHOS, ALT, AST, GLUCOSE in the last 168 hours.  Invalid input(s): LABALBU Lipids Lab Results  Component Value Date   CHOL 146 12/28/2014   HDL 41.90 12/28/2014   LDLCALC 77 12/28/2014   TRIG 137.0 12/28/2014   BNP No results found for: PROBNP Thyroid Function Tests: No results for input(s): TSH, T4TOTAL, T3FREE, THYROIDAB in the last 72 hours.  Invalid input(s): FREET3 Miscellaneous No results found for: DDIMER  Radiology/Studies:  No results found.  EKG: sinus @ 86 13/08/38 occ PVC   Assessment and Plan:  Cryptogenic Stroke multiple  CAD--prior stenting  Atrial tach  Dyspnea   Patient has had multiple strokes. Telemetry has been revealing for atrial fibrillation is reasonable to pursue loop recorder insertion guide therapy  Her lipids last checked 3/18 LDL 105  Should be recheck on 80 atorva as was recently increased from 40     Jatavian Calica  

## 2017-05-25 NOTE — Patient Instructions (Addendum)
Medication Instructions: - Your physician recommends that you continue on your current medications as directed. Please refer to the Current Medication list given to you today.  Labwork: - none ordered  Procedures/Testing: - Your physician has recommended that you have an implantable loop recorder (LINQ).  You are scheduled for Wednesday 06/23/17 with Dr. Graciela HusbandsKlein [facility] at 11:30 am on the day of your procedure (this [facility] the [facility]).   - Once you enter proceed straight down the hall way to Admitting to register, this will be on your left - You may have a light breakfast prior to coming to the hospital - Bring a current list of your medications and insurance cards with you to the hospital - Please wash your chest and neck area with antibacterial soap (any brand) the night before and morning of your procedure  Follow-Up: - Your physician recommends that you schedule a follow-up appointment in: 10-14 days (from 06/23/17) with the [facility] for a wound check  - Your physician recommends that you schedule a follow-up appointment in: 3 months with Dr. Anne FuSkains   Any Additional Special Instructions Will Be Listed Below (If Applicable).     If you need a refill on your cardiac medications before your next appointment, please call your pharmacy.

## 2017-06-08 ENCOUNTER — Emergency Department (HOSPITAL_COMMUNITY): Payer: Medicare HMO

## 2017-06-08 ENCOUNTER — Encounter (HOSPITAL_COMMUNITY): Payer: Self-pay | Admitting: *Deleted

## 2017-06-08 ENCOUNTER — Inpatient Hospital Stay (HOSPITAL_COMMUNITY)
Admission: EM | Admit: 2017-06-08 | Discharge: 2017-06-12 | DRG: 065 | Disposition: A | Discharge status: Home / Self Care | Payer: Medicare HMO | Attending: Internal Medicine | Admitting: Internal Medicine

## 2017-06-08 DIAGNOSIS — R9401 Abnormal electroencephalogram [EEG]: Secondary | ICD-10-CM | POA: Diagnosis present

## 2017-06-08 DIAGNOSIS — I63412 Cerebral infarction due to embolism of left middle cerebral artery: Principal | ICD-10-CM | POA: Diagnosis present

## 2017-06-08 DIAGNOSIS — H538 Other visual disturbances: Secondary | ICD-10-CM | POA: Diagnosis present

## 2017-06-08 DIAGNOSIS — R531 Weakness: Secondary | ICD-10-CM

## 2017-06-08 DIAGNOSIS — I10 Essential (primary) hypertension: Secondary | ICD-10-CM | POA: Diagnosis present

## 2017-06-08 DIAGNOSIS — E785 Hyperlipidemia, unspecified: Secondary | ICD-10-CM | POA: Diagnosis present

## 2017-06-08 DIAGNOSIS — R079 Chest pain, unspecified: Secondary | ICD-10-CM

## 2017-06-08 DIAGNOSIS — I214 Non-ST elevation (NSTEMI) myocardial infarction: Secondary | ICD-10-CM

## 2017-06-08 DIAGNOSIS — Z794 Long term (current) use of insulin: Secondary | ICD-10-CM

## 2017-06-08 DIAGNOSIS — E119 Type 2 diabetes mellitus without complications: Secondary | ICD-10-CM

## 2017-06-08 DIAGNOSIS — R0602 Shortness of breath: Secondary | ICD-10-CM | POA: Diagnosis present

## 2017-06-08 DIAGNOSIS — Z66 Do not resuscitate: Secondary | ICD-10-CM | POA: Diagnosis present

## 2017-06-08 DIAGNOSIS — G8194 Hemiplegia, unspecified affecting left nondominant side: Secondary | ICD-10-CM | POA: Diagnosis present

## 2017-06-08 DIAGNOSIS — M797 Fibromyalgia: Secondary | ICD-10-CM | POA: Diagnosis present

## 2017-06-08 DIAGNOSIS — Z6824 Body mass index (BMI) 24.0-24.9, adult: Secondary | ICD-10-CM

## 2017-06-08 DIAGNOSIS — H539 Unspecified visual disturbance: Secondary | ICD-10-CM

## 2017-06-08 DIAGNOSIS — Z7982 Long term (current) use of aspirin: Secondary | ICD-10-CM

## 2017-06-08 DIAGNOSIS — Z888 Allergy status to other drugs, medicaments and biological substances status: Secondary | ICD-10-CM

## 2017-06-08 DIAGNOSIS — F329 Major depressive disorder, single episode, unspecified: Secondary | ICD-10-CM | POA: Diagnosis present

## 2017-06-08 DIAGNOSIS — M1990 Unspecified osteoarthritis, unspecified site: Secondary | ICD-10-CM | POA: Diagnosis present

## 2017-06-08 DIAGNOSIS — Z79899 Other long term (current) drug therapy: Secondary | ICD-10-CM

## 2017-06-08 DIAGNOSIS — I672 Cerebral atherosclerosis: Secondary | ICD-10-CM | POA: Diagnosis present

## 2017-06-08 DIAGNOSIS — I69398 Other sequelae of cerebral infarction: Secondary | ICD-10-CM

## 2017-06-08 DIAGNOSIS — Z8249 Family history of ischemic heart disease and other diseases of the circulatory system: Secondary | ICD-10-CM

## 2017-06-08 DIAGNOSIS — Z6825 Body mass index (BMI) 25.0-25.9, adult: Secondary | ICD-10-CM

## 2017-06-08 DIAGNOSIS — I69354 Hemiplegia and hemiparesis following cerebral infarction affecting left non-dominant side: Secondary | ICD-10-CM

## 2017-06-08 DIAGNOSIS — Z882 Allergy status to sulfonamides status: Secondary | ICD-10-CM

## 2017-06-08 DIAGNOSIS — E538 Deficiency of other specified B group vitamins: Secondary | ICD-10-CM | POA: Diagnosis present

## 2017-06-08 DIAGNOSIS — R42 Dizziness and giddiness: Secondary | ICD-10-CM

## 2017-06-08 DIAGNOSIS — E440 Moderate protein-calorie malnutrition: Secondary | ICD-10-CM | POA: Insufficient documentation

## 2017-06-08 DIAGNOSIS — K580 Irritable bowel syndrome with diarrhea: Secondary | ICD-10-CM | POA: Diagnosis present

## 2017-06-08 DIAGNOSIS — Z955 Presence of coronary angioplasty implant and graft: Secondary | ICD-10-CM

## 2017-06-08 DIAGNOSIS — I639 Cerebral infarction, unspecified: Secondary | ICD-10-CM | POA: Diagnosis present

## 2017-06-08 DIAGNOSIS — I2510 Atherosclerotic heart disease of native coronary artery without angina pectoris: Secondary | ICD-10-CM | POA: Diagnosis present

## 2017-06-08 DIAGNOSIS — F419 Anxiety disorder, unspecified: Secondary | ICD-10-CM | POA: Diagnosis present

## 2017-06-08 DIAGNOSIS — E039 Hypothyroidism, unspecified: Secondary | ICD-10-CM | POA: Diagnosis present

## 2017-06-08 LAB — BASIC METABOLIC PANEL
ANION GAP: 9 (ref 5–15)
BUN: 16 mg/dL (ref 6–20)
CALCIUM: 9.4 mg/dL (ref 8.9–10.3)
CO2: 25 mmol/L (ref 22–32)
Chloride: 106 mmol/L (ref 101–111)
Creatinine, Ser: 1 mg/dL (ref 0.44–1.00)
GFR calc Af Amer: >60 mL/min (ref >60)
GFR calc non Af Amer: 57 mL/min — ABNORMAL LOW (ref >60)
GLUCOSE: 126 mg/dL — AB (ref 65–99)
Potassium: 3.8 mmol/L (ref 3.5–5.1)
Sodium: 140 mmol/L (ref 135–145)

## 2017-06-08 LAB — CBC
HEMATOCRIT: 41 % (ref 36.0–46.0)
HEMOGLOBIN: 13.9 g/dL (ref 12.0–15.0)
MCH: 27.8 pg (ref 26.0–34.0)
MCHC: 33.9 g/dL (ref 30.0–36.0)
MCV: 82 fL (ref 78.0–100.0)
Platelets: 197 10*3/uL (ref 150–400)
RBC: 5 MIL/uL (ref 3.87–5.11)
RDW: 12.9 % (ref 11.5–15.5)
WBC: 9.4 10*3/uL (ref 4.0–10.5)

## 2017-06-08 LAB — I-STAT TROPONIN, ED: TROPONIN I, POC: 0.01 ng/mL (ref 0.00–0.08)

## 2017-06-08 NOTE — ED Triage Notes (Signed)
Pt brought in by [facility] for c/o dizziness and chest tightness that started yesterday; pt has a hx of heart issue and strokes

## 2017-06-08 NOTE — ED Provider Notes (Signed)
[facility]PT Provider Note   CSN: [id number] Arrival date & time: 06/08/17  2243  Time seen 23:10 PM   History   Chief Complaint Chief Complaint  Patient presents with  . Near Syncope    HPI Cardell Peachsther Bonita Wey is a 66 y.o. female.  HPI patient states "I feel bad". She states yesterday she started feeling worse. She states she never feels well. She states she has felt dizzy and weak and had loss of vision. She also reports some weakness in her right upper extremity over the past 2 weeks and states sometimes when she tries to hold her cane just drops out of her hand. Patient is right handed. She states she has started having visual problems when she started having strokes. She states she has chronic loss of peripheral vision and it's worse in her left eye than her right. She states her vision fluctuates and yesterday her vision is worse, she states her vision is dimmer, she states it's worse today. She states she feels lightheaded and when she walks she feels like she's swaying and having trouble keeping her balance. She complains of feeling diffusely weak without being weak and a specific arm or leg other than having the difficulty gripping and her right hand. She states her whole body feels heavy today. She also describes chest heaviness and some shortness of breath. She states that also started yesterday and got worse tonight. She denies headache, nausea, vomiting, or diaphoresis. Patient has been evaluated several times and has multi-infarct strokes. She had a TEE recently on June 4 without PFO or intracardiac emboli found. She is scheduled to have a loop recorder placed on August 15.  PCP Nila NephewGreen, Edwin, MD Cardiology Dr Anne FuSkains  Past Medical History:  Diagnosis Date  . Anxiety   . Arthritis   . Coronary artery disease    a. s/p PCI with DES to LAD in 2011  (LHC 03/2010: Mid LAD 70%, normal ejection fraction  >> PCI:  2.75 x 15 mm Promus DES to LAD); b. Nuclear Stress Test 05/29/14:  No ischemia, EF 59%, Normal; c. NSTEMI 11/2014 >> patient declined LHC b/c of recent CVA  . Depression   . Diabetes mellitus without complication (HCC)   . Dyslipidemia 11/20/2014  . Fibromyalgia 1995  . H/O ischemic right PCA stroke 10/2014   a. Carotid US 10/24/14: No significant ICA stenosis bilaterally  . Hemiparesis and alteration of sensations as late effects of stroke (HCC) 06/05/2015  . Hx of echocardiogram    a. Echocardiogram 10/25/14: Moderate LVH, EF 50-55%, normal wall motion, grade 2 diastolic dysfunction, mild LAE, atrial septal lipomatous hypertrophy  . Hypertension   . Irritable bowel     Patient Active Problem List   Diagnosis Date Noted  . CVA (cerebrovascular accident) (HCC) 04/10/2017  . Dizziness 04/09/2017  . Hemiparesis and alteration of sensations as late effects of stroke (HCC) 06/05/2015  . Numbness 12/27/2014  . Visual disturbance 11/26/2014  . Dyslipidemia 11/20/2014  . Hypothyroidism 11/20/2014  . Pain   . Cerebral thrombosis with cerebral infarction (HCC) 11/19/2014  . Weakness   . NSTEMI (non-ST elevated myocardial infarction) (HCC) 11/17/2014  . Acute CVA -Dec 2015 10/24/2014  . DM type 2 (diabetes mellitus, type 2) (HCC) 10/24/2014  . Accelerated hypertension 10/24/2014  . Atypical chest pain 05/16/2014  . CAD S/P LAD PCI July 2011 07/01/2013  . Hypertension, essential 07/01/2013  . Fibromyalgia 07/01/2013  . IBS (irritable bowel syndrome) 07/01/2013    Past Surgical History:  Procedure Laterality Date  . CHOLECYSTECTOMY    . CORONARY STENT PLACEMENT    . orif arm fracture Right    forearm  . TEE WITHOUT CARDIOVERSION N/A 04/12/2017   Procedure: TRANSESOPHAGEAL ECHOCARDIOGRAM (TEE) WITH PROPOFOL;  Surgeon: Jonelle Sidle, MD;  Location: [facility];  Service: Cardiovascular;  Laterality: N/A;    OB History    Gravida Para Term Preterm AB Living   12 4 4   8      SAB TAB Ectopic Multiple Live Births                   Home  Medications    Prior to Admission medications   Medication Sig Start Date End Date Taking? Authorizing Provider  amLODipine (NORVASC) 10 MG tablet Take 10 mg by mouth daily. 09/11/16   [provider]  Ascorbic Acid (VITAMIN C) 100 MG tablet Take 100 mg by mouth daily.    [provider]  aspirin EC 81 MG tablet Take 1 tablet (81 mg total) by mouth daily. 11/27/14   Tereso Newcomer T, PA-C  atorvastatin (LIPITOR) 80 MG tablet Take 1 tablet (80 mg total) by mouth daily. 05/03/17   Nilda Riggs, NP  Cholecalciferol (VITAMIN D-3) 1000 UNITS CAPS Take 1 capsule by mouth daily.    [provider]  clopidogrel (PLAVIX) 75 MG tablet TAKE 1 TABLET (75 MG TOTAL) BY MOUTH DAILY WITH BREAKFAST. 02/19/17   Jake Bathe, MD  gabapentin (NEURONTIN) 400 MG capsule Take 400 mg by mouth 2 (two) times daily. 07/29/16   [provider]  GARLIC PO Take 1 capsule by mouth daily.    [provider]  glimepiride (AMARYL) 4 MG tablet Take 4 mg by mouth 2 (two) times daily. 09/03/16   [provider]  hydrOXYzine (ATARAX/VISTARIL) 25 MG tablet Take 25 mg by mouth 3 (three) times daily as needed for anxiety.     [provider]  ibuprofen (ADVIL,MOTRIN) 200 MG tablet Take 200-400 mg by mouth every 6 (six) hours as needed.    [provider]  isosorbide mononitrate (IMDUR) 30 MG 24 hr tablet Take 1 tablet (30 mg total) by mouth daily. DUE APPT IN AUGUST, MAKE APPT FOR FURTHER REFILLS, CALL [phone] TO SCHEDULE 04/16/17   Jake Bathe, MD  JANUVIA 100 MG tablet Take 100 mg by mouth daily. 09/11/16   [provider]  Lancets (FREESTYLE) lancets Check blood sugar four times per day. Before each meal and at bedtime 07/19/14   Holland Commons A, NP  levothyroxine (SYNTHROID, LEVOTHROID) 25 MCG tablet TAKE 1 TABLET (25 MCG TOTAL) BY MOUTH DAILY BEFORE BREAKFAST. 07/30/15   Jake Bathe, MD  losartan (COZAAR) 100 MG tablet Take 100 mg by mouth  daily. 07/29/16   [provider]  metoprolol tartrate (LOPRESSOR) 25 MG tablet TAKE 1 TABLET (25 MG TOTAL) BY MOUTH 2 (TWO) TIMES DAILY. 02/20/16   Jake Bathe, MD  Multiple Vitamin (MULTIVITAMIN) capsule Take 1 capsule by mouth daily. Senior vitamin    [provider]  nitroGLYCERIN (NITROSTAT) 0.4 MG SL tablet Place 1 tablet (0.4 mg total) under the tongue every 5 (five) minutes as needed. 11/27/14   Tereso Newcomer T, PA-C  temazepam (RESTORIL) 15 MG capsule Take 30 mg by mouth at bedtime.     [provider]  vitamin E (VITAMIN E) 400 UNIT capsule Take 400 Units by mouth every morning.    [provider]  Family History Family History  Problem Relation Age of Onset  . Cancer Mother   . Diabetes Mother   . Hypertension Mother   . Heart failure Mother   . CAD Mother   . Heart attack Mother   . Cancer Father   . Diabetes Father   . Hypertension Father   . Heart failure Father   . CAD Father   . Heart attack Father   . Stroke Father   . CAD Brother   . Diabetes Brother   . Hypertension Brother   . CAD Sister   . Diabetes Sister   . Hypertension Sister   . Heart attack Sister   . Heart attack Brother   . Stroke Maternal Aunt     Social History Social History  Substance Use Topics  . Smoking status: Never Smoker  . Smokeless tobacco: Never Used  . Alcohol use No  lives alone (with her dog) GD lives nearby Patient uses a cane   Allergies   Insulins; Sulfa antibiotics; and Lisinopril   Review of Systems Review of Systems  All other systems reviewed and are negative.    Physical Exam Updated Vital Signs BP (!) 131/59 (BP Location: Left Arm)   Pulse (!) 57   Temp 97.6 F (36.4 C) (Oral)   Resp 17   Ht 5\' 4"  (1.626 m)   Wt 66.2 kg (146 lb)   SpO2 98%   BMI 25.06 kg/m   Vital signs normal    Physical Exam  Constitutional: She is oriented to person, place, and time. She appears well-developed and well-nourished.   Non-toxic appearance. She does not appear ill. No distress.  HENT:  Head: Normocephalic and atraumatic.  Right Ear: External ear normal.  Left Ear: External ear normal.  Nose: Nose normal. No mucosal edema or rhinorrhea.  Mouth/Throat: Oropharynx is clear and moist and mucous membranes are normal. No dental abscesses or uvula swelling.  Eyes: Pupils are equal, round, and reactive to light. Conjunctivae and EOM are normal.  Neck: Normal range of motion and full passive range of motion without pain. Neck supple.  Cardiovascular: Normal rate, regular rhythm and normal heart sounds.  Exam reveals no gallop and no friction rub.   No murmur heard. Pulmonary/Chest: Effort normal and breath sounds normal. No respiratory distress. She has no wheezes. She has no rhonchi. She has no rales. She exhibits no tenderness and no crepitus.  Abdominal: Soft. Normal appearance and bowel sounds are normal. She exhibits no distension. There is no tenderness. There is no rebound and no guarding.  Musculoskeletal: Normal range of motion. She exhibits no edema or tenderness.  Moves all extremities well.   Neurological: She is alert and oriented to person, place, and time. She has normal strength. No cranial nerve deficit.  No pronator drift, her grips are equal. Patient can lift both lower extremities against gravity but she states it's more difficult on the left.  Skin: Skin is warm, dry and intact. No rash noted. No erythema. No pallor.  Psychiatric: Her speech is normal and behavior is normal. Her mood appears not anxious.  Flat affect  Nursing note and vitals reviewed.    ED Treatments / Results  Labs (all labs ordered are listed, but only abnormal results are displayed) Results for orders placed or performed during the hospital encounter of 06/08/17  Basic metabolic panel  Result Value Ref Range   Sodium 140 135 - 145 mmol/L   Potassium 3.8 3.5 - 5.1 mmol/L  Chloride 106 101 - 111 mmol/L   CO2 25 22 -  32 mmol/L   Glucose, Bld 126 (H) 65 - 99 mg/dL   BUN 16 6 - 20 mg/dL   Creatinine, Ser 1.911.00 0.44 - 1.00 mg/dL   Calcium 9.4 8.9 - 47.810.3 mg/dL   GFR calc non Af Amer 57 (L) >60 mL/min   GFR calc Af Amer >60 >60 mL/min   Anion gap 9 5 - 15  CBC  Result Value Ref Range   WBC 9.4 4.0 - 10.5 K/uL   RBC 5.00 3.87 - 5.11 MIL/uL   Hemoglobin 13.9 12.0 - 15.0 g/dL   HCT 29.541.0 62.136.0 - 30.846.0 %   MCV 82.0 78.0 - 100.0 fL   MCH 27.8 26.0 - 34.0 pg   MCHC 33.9 30.0 - 36.0 g/dL   RDW 65.712.9 84.611.5 - 96.215.5 %   Platelets 197 150 - 400 K/uL  I-stat troponin, ED  Result Value Ref Range   Troponin i, poc 0.01 0.00 - 0.08 ng/mL   Comment 3          I-stat troponin, ED  Result Value Ref Range   Troponin i, poc 0.00 0.00 - 0.08 ng/mL   Comment 3           Laboratory interpretation all normal     EKG  EKG Interpretation  Date/Time:  Tuesday June 08 2017 22:49:51 EDT Ventricular Rate:  59 PR Interval:    QRS Duration: 101 QT Interval:  436 QTC Calculation: 432 R Axis:   59 Text Interpretation:  Sinus rhythm Ventricular premature complex Borderline T wave abnormalities No significant change since last tracing 24 Apr 2017 Confirmed by Devoria AlbeKnapp, Zethan Alfieri ([id number]) on 06/08/2017 11:00:20 PM       Radiology Dg Chest 2 View  Result Date: 06/09/2017 CLINICAL DATA:  Dizziness and chest tightness for the past 2 days. Shortness of breath. EXAM: CHEST  2 VIEW COMPARISON:  04/09/2017. FINDINGS: Normal sized heart. Clear lungs with normal vascularity. Unremarkable bones. IMPRESSION: No acute abnormality. Electronically Signed   By: Beckie SaltsSteven  Reid M.D.   On: 06/09/2017 00:03   Ct Head Wo Contrast  Result Date: 06/09/2017 CLINICAL DATA:  Dizziness and blurred vision since 06/07/2017. History of stroke. EXAM: CT HEAD WITHOUT CONTRAST TECHNIQUE: Contiguous axial images were obtained from the base of the skull through the vertex without intravenous contrast. COMPARISON:  04/24/2017 FINDINGS: Brain: Patchy low-attenuation areas  throughout the deep white matter consistent with small vessel ischemia. Old infarcts demonstrated in the right occipital lobe, left posterior parietal lobe, and left cerebellum. No changes since previous study. No mass effect or midline shift. No abnormal extra-axial fluid collections. Gray-white matter junctions are distinct. Basal cisterns are not effaced. No ventricular dilatation. No evidence of acute intracranial hemorrhage. Vascular: Vascular calcifications are present. Skull: No depressed skull fractures. Sinuses/Orbits: No acute finding. Other: None. IMPRESSION: No acute intracranial abnormalities. Chronic small vessel ischemic changes. Old infarcts in the right occipital, left posterior parietal, and left cerebellar regions. Electronically Signed   By: Burman NievesWilliam  Stevens M.D.   On: 06/09/2017 01:25    Procedures Procedures (including critical care time)  Medications Ordered in ED Medications - No data to display   Initial Impression / Assessment and Plan / ED Course  I have reviewed the triage vital signs and the nursing notes.  Pertinent labs & imaging results that were available during my care of the patient were reviewed by me and considered in my medical decision making (see  chart for details).     12:22 AM Patient discussed with Dr. Sharl Ma, hospitalist he recommends getting CT of the head tonight and if it does not show acute changes have her evaluated by telephone neurology. He also states he could admit her for her chest pain if she continues to complain of chest pain.   01:50 AM pt states her chest pain is gone. We discussed getting a delta troponin to evaluate her chest pain. After reviewing her CT scan of her head tele-neurology consult was ordered.  Tele-neurology consult was done at 4 AM with Dr. Noel Gerold. He did not do a video exam but based on my exam which did not show any new acute focal weakness he felt because of her young age she should be admitted and get another MRI. He  states if she has new infarcts consistent with embolic events that he would recommend starting her on Eliquis. He states otherwise she is on optimal medical management.  04:14 AM Dr Sharl Ma, hospitalist, will admit.  Final Clinical Impressions(s) / ED Diagnoses   Final diagnoses:  Weakness  Chest pain, unspecified type   Plan admission   Devoria Albe, MD, Concha Pyo, MD 06/09/17 [phone]

## 2017-06-09 ENCOUNTER — Ambulatory Visit: Payer: Medicare HMO | Admitting: Nurse Practitioner

## 2017-06-09 ENCOUNTER — Inpatient Hospital Stay (HOSPITAL_COMMUNITY): Payer: Medicare HMO

## 2017-06-09 ENCOUNTER — Emergency Department (HOSPITAL_COMMUNITY): Payer: Medicare HMO

## 2017-06-09 ENCOUNTER — Observation Stay (HOSPITAL_COMMUNITY): Payer: Medicare HMO

## 2017-06-09 ENCOUNTER — Encounter (HOSPITAL_COMMUNITY): Payer: Self-pay

## 2017-06-09 DIAGNOSIS — E538 Deficiency of other specified B group vitamins: Secondary | ICD-10-CM | POA: Diagnosis present

## 2017-06-09 DIAGNOSIS — R0602 Shortness of breath: Secondary | ICD-10-CM | POA: Diagnosis present

## 2017-06-09 DIAGNOSIS — R42 Dizziness and giddiness: Secondary | ICD-10-CM | POA: Diagnosis not present

## 2017-06-09 DIAGNOSIS — E119 Type 2 diabetes mellitus without complications: Secondary | ICD-10-CM | POA: Diagnosis present

## 2017-06-09 DIAGNOSIS — I639 Cerebral infarction, unspecified: Secondary | ICD-10-CM | POA: Diagnosis not present

## 2017-06-09 DIAGNOSIS — E440 Moderate protein-calorie malnutrition: Secondary | ICD-10-CM | POA: Insufficient documentation

## 2017-06-09 DIAGNOSIS — R079 Chest pain, unspecified: Secondary | ICD-10-CM | POA: Diagnosis not present

## 2017-06-09 DIAGNOSIS — I63412 Cerebral infarction due to embolism of left middle cerebral artery: Secondary | ICD-10-CM | POA: Diagnosis present

## 2017-06-09 DIAGNOSIS — H539 Unspecified visual disturbance: Secondary | ICD-10-CM

## 2017-06-09 DIAGNOSIS — R531 Weakness: Secondary | ICD-10-CM | POA: Diagnosis present

## 2017-06-09 DIAGNOSIS — Z794 Long term (current) use of insulin: Secondary | ICD-10-CM | POA: Diagnosis not present

## 2017-06-09 DIAGNOSIS — F329 Major depressive disorder, single episode, unspecified: Secondary | ICD-10-CM | POA: Diagnosis present

## 2017-06-09 DIAGNOSIS — Z8249 Family history of ischemic heart disease and other diseases of the circulatory system: Secondary | ICD-10-CM | POA: Diagnosis not present

## 2017-06-09 DIAGNOSIS — K580 Irritable bowel syndrome with diarrhea: Secondary | ICD-10-CM | POA: Diagnosis present

## 2017-06-09 DIAGNOSIS — F419 Anxiety disorder, unspecified: Secondary | ICD-10-CM | POA: Diagnosis present

## 2017-06-09 DIAGNOSIS — I2510 Atherosclerotic heart disease of native coronary artery without angina pectoris: Secondary | ICD-10-CM | POA: Diagnosis present

## 2017-06-09 DIAGNOSIS — H538 Other visual disturbances: Secondary | ICD-10-CM | POA: Diagnosis present

## 2017-06-09 DIAGNOSIS — I10 Essential (primary) hypertension: Secondary | ICD-10-CM | POA: Diagnosis present

## 2017-06-09 DIAGNOSIS — Z7982 Long term (current) use of aspirin: Secondary | ICD-10-CM | POA: Diagnosis not present

## 2017-06-09 DIAGNOSIS — E785 Hyperlipidemia, unspecified: Secondary | ICD-10-CM | POA: Diagnosis present

## 2017-06-09 DIAGNOSIS — E039 Hypothyroidism, unspecified: Secondary | ICD-10-CM | POA: Diagnosis present

## 2017-06-09 DIAGNOSIS — I672 Cerebral atherosclerosis: Secondary | ICD-10-CM | POA: Diagnosis present

## 2017-06-09 DIAGNOSIS — I69354 Hemiplegia and hemiparesis following cerebral infarction affecting left non-dominant side: Secondary | ICD-10-CM | POA: Diagnosis not present

## 2017-06-09 DIAGNOSIS — Z66 Do not resuscitate: Secondary | ICD-10-CM | POA: Diagnosis present

## 2017-06-09 DIAGNOSIS — E032 Hypothyroidism due to medicaments and other exogenous substances: Secondary | ICD-10-CM | POA: Diagnosis not present

## 2017-06-09 DIAGNOSIS — G8194 Hemiplegia, unspecified affecting left nondominant side: Secondary | ICD-10-CM | POA: Diagnosis present

## 2017-06-09 DIAGNOSIS — M1990 Unspecified osteoarthritis, unspecified site: Secondary | ICD-10-CM | POA: Diagnosis present

## 2017-06-09 DIAGNOSIS — R9401 Abnormal electroencephalogram [EEG]: Secondary | ICD-10-CM | POA: Diagnosis present

## 2017-06-09 DIAGNOSIS — M797 Fibromyalgia: Secondary | ICD-10-CM | POA: Diagnosis present

## 2017-06-09 LAB — CBC
HCT: 41.9 % (ref 36.0–46.0)
Hemoglobin: 14.1 g/dL (ref 12.0–15.0)
MCH: 27.6 pg (ref 26.0–34.0)
MCHC: 33.7 g/dL (ref 30.0–36.0)
MCV: 82 fL (ref 78.0–100.0)
PLATELETS: 204 10*3/uL (ref 150–400)
RBC: 5.11 MIL/uL (ref 3.87–5.11)
RDW: 12.9 % (ref 11.5–15.5)
WBC: 9.6 10*3/uL (ref 4.0–10.5)

## 2017-06-09 LAB — COMPREHENSIVE METABOLIC PANEL
ALT: 20 U/L (ref 14–54)
AST: 21 U/L (ref 15–41)
Albumin: 3.7 g/dL (ref 3.5–5.0)
Alkaline Phosphatase: 59 U/L (ref 38–126)
Anion gap: 11 (ref 5–15)
BUN: 13 mg/dL (ref 6–20)
CHLORIDE: 104 mmol/L (ref 101–111)
CO2: 24 mmol/L (ref 22–32)
Calcium: 9 mg/dL (ref 8.9–10.3)
Creatinine, Ser: 0.88 mg/dL (ref 0.44–1.00)
GFR calc Af Amer: >60 mL/min (ref >60)
Glucose, Bld: 129 mg/dL — ABNORMAL HIGH (ref 65–99)
POTASSIUM: 3.2 mmol/L — AB (ref 3.5–5.1)
SODIUM: 139 mmol/L (ref 135–145)
Total Bilirubin: 0.7 mg/dL (ref 0.3–1.2)
Total Protein: 6.5 g/dL (ref 6.5–8.1)

## 2017-06-09 LAB — GLUCOSE, CAPILLARY
GLUCOSE-CAPILLARY: 131 mg/dL — AB (ref 65–99)
GLUCOSE-CAPILLARY: 119 mg/dL — AB (ref 65–99)
Glucose-Capillary: 308 mg/dL — ABNORMAL HIGH (ref 65–99)

## 2017-06-09 LAB — I-STAT TROPONIN, ED: TROPONIN I, POC: 0 ng/mL (ref 0.00–0.08)

## 2017-06-09 LAB — C-REACTIVE PROTEIN: CRP: 1.1 mg/dL — ABNORMAL HIGH (ref <1.0)

## 2017-06-09 LAB — TROPONIN I: Troponin I: <0.03 ng/mL (ref <0.03)

## 2017-06-09 LAB — VITAMIN B12: Vitamin B-12: 145 pg/mL — ABNORMAL LOW (ref 180–914)

## 2017-06-09 LAB — SEDIMENTATION RATE: SED RATE: 3 mm/h (ref 0–22)

## 2017-06-09 LAB — TSH: TSH: 2.303 u[IU]/mL (ref 0.350–4.500)

## 2017-06-09 MED ORDER — ONDANSETRON HCL 4 MG/2ML IJ SOLN: 4.0000 mg | Freq: Four times a day (QID) | INTRAMUSCULAR | Status: DC | PRN

## 2017-06-09 MED ORDER — CLOPIDOGREL BISULFATE 75 MG PO TABS
75.0000 mg | ORAL_TABLET | Freq: Every day | ORAL | Status: DC
  Administered 2017-06-09 – 2017-06-12 (×4): 75 mg via ORAL
  Filled 2017-06-09 (×4): qty 1

## 2017-06-09 MED ORDER — SODIUM CHLORIDE 0.9 % IV SOLN
INTRAVENOUS | Status: DC
  Administered 2017-06-09 – 2017-06-12 (×3): via INTRAVENOUS

## 2017-06-09 MED ORDER — ATORVASTATIN CALCIUM 40 MG PO TABS
80.0000 mg | ORAL_TABLET | Freq: Every day | ORAL | Status: DC
  Administered 2017-06-09 – 2017-06-12 (×4): 80 mg via ORAL
  Filled 2017-06-09 (×4): qty 2

## 2017-06-09 MED ORDER — ISOSORBIDE MONONITRATE ER 60 MG PO TB24
30.0000 mg | ORAL_TABLET | Freq: Every day | ORAL | Status: DC
  Administered 2017-06-09 – 2017-06-12 (×4): 30 mg via ORAL
  Filled 2017-06-09 (×4): qty 1

## 2017-06-09 MED ORDER — GABAPENTIN 400 MG PO CAPS
400.0000 mg | ORAL_CAPSULE | Freq: Two times a day (BID) | ORAL | Status: DC
  Administered 2017-06-09 – 2017-06-12 (×7): 400 mg via ORAL
  Filled 2017-06-09 (×7): qty 1

## 2017-06-09 MED ORDER — TEMAZEPAM 15 MG PO CAPS
30.0000 mg | ORAL_CAPSULE | Freq: Every day | ORAL | Status: DC
  Administered 2017-06-09 – 2017-06-11 (×3): 30 mg via ORAL
  Filled 2017-06-09 (×3): qty 2

## 2017-06-09 MED ORDER — ACETAMINOPHEN 325 MG PO TABS
650.0000 mg | ORAL_TABLET | Freq: Four times a day (QID) | ORAL | Status: DC | PRN
  Administered 2017-06-10 – 2017-06-11 (×3): 650 mg via ORAL
  Filled 2017-06-09 (×3): qty 2

## 2017-06-09 MED ORDER — HYDROXYZINE HCL 25 MG PO TABS
25.0000 mg | ORAL_TABLET | Freq: Three times a day (TID) | ORAL | Status: DC | PRN
  Administered 2017-06-09 – 2017-06-10 (×3): 25 mg via ORAL
  Filled 2017-06-09 (×3): qty 1

## 2017-06-09 MED ORDER — ONDANSETRON HCL 4 MG PO TABS: 4.0000 mg | ORAL_TABLET | Freq: Four times a day (QID) | ORAL | Status: DC | PRN

## 2017-06-09 MED ORDER — INSULIN ASPART 100 UNIT/ML ~~LOC~~ SOLN
0.0000 [IU] | Freq: Three times a day (TID) | SUBCUTANEOUS | Status: DC
  Administered 2017-06-09: 7 [IU] via SUBCUTANEOUS
  Administered 2017-06-10 (×3): 2 [IU] via SUBCUTANEOUS

## 2017-06-09 MED ORDER — ENOXAPARIN SODIUM 40 MG/0.4ML ~~LOC~~ SOLN
40.0000 mg | SUBCUTANEOUS | Status: DC
  Administered 2017-06-09 – 2017-06-12 (×4): 40 mg via SUBCUTANEOUS
  Filled 2017-06-09 (×4): qty 0.4

## 2017-06-09 MED ORDER — ACETAMINOPHEN 650 MG RE SUPP: 650.0000 mg | Freq: Four times a day (QID) | RECTAL | Status: DC | PRN

## 2017-06-09 MED ORDER — GLUCERNA PO LIQD: 237.0000 mL | Freq: Two times a day (BID) | ORAL | Status: DC

## 2017-06-09 MED ORDER — GLUCERNA SHAKE PO LIQD
237.0000 mL | Freq: Two times a day (BID) | ORAL | Status: DC
  Administered 2017-06-09 – 2017-06-12 (×7): 237 mL via ORAL

## 2017-06-09 MED ORDER — ASPIRIN EC 81 MG PO TBEC
81.0000 mg | DELAYED_RELEASE_TABLET | Freq: Every day | ORAL | Status: DC
  Administered 2017-06-09 – 2017-06-12 (×4): 81 mg via ORAL
  Filled 2017-06-09 (×4): qty 1

## 2017-06-09 MED ORDER — LEVOTHYROXINE SODIUM 25 MCG PO TABS
25.0000 ug | ORAL_TABLET | Freq: Every day | ORAL | Status: DC
  Administered 2017-06-09 – 2017-06-12 (×4): 25 ug via ORAL
  Filled 2017-06-09 (×4): qty 1

## 2017-06-09 NOTE — Progress Notes (Signed)
FOLLOW UP FROM EARLIER ADMISSION TODAY:  66 yo with hx of " 8 strokes", DM, HTN, hypothyroidism, on DUAT with compliance, admitted with dizziness, blurry Vx, and chest pain.  Her MRI showed acute CVA in the pre central gyrus with resolving area of subacute ischemia in the left centrum semiovale, along with prior old CVAs.  She also had atypical chest pain with negative troponins.  Her oral hypoglycemic agents were held, and she has agreed to take sensitve insulin coverage.  She said it gave her diarrhea.   She had TEE and recent loop recorder inserted by cardiology July 2018.   Will obtain carotid US and neuro consultation.   Houston SirenPeter Waldron Gerry MD FACP. Hospitalist.

## 2017-06-09 NOTE — Progress Notes (Signed)
Initial Nutrition Assessment  DOCUMENTATION CODES:  Non-severe (moderate) malnutrition in context of chronic illness  INTERVENTION:  Food requests/preferences taken to promote PO intake  Continue Glucerna BID  NUTRITION DIAGNOSIS:  Malnutrition (Moderate) related to poor appetite/intake of unknown etiology (recurrent cvas?) as evidenced by loss of >10% bw in last 5 months  GOAL:  Patient will meet greater than or equal to 90% of their needs  MONITOR:  PO intake, Supplement acceptance, Labs, Weight trends  REASON FOR ASSESSMENT:  Malnutrition Screening Tool    ASSESSMENT:  66 y/o female PMHx multiple CVAs, DM, Hypothyroidism, HTN, HLD, anxiety,  Depression, IBS. Presented with chest pain, dizziness and blurred vision.  MRI on 8/1 reveals acute infarct.  Patient had reported unintentional weight loss on MST. Patient says that her UBW has been 165 lbs for a very long time, but she "has lost 20-25 lbs over the last month or two".   Her chart does show weight loss, however, appears. She appeared to have been stable at 155-160 lbs up until March of this year when she had a stroke. Was 160.1 lbs on 3/23. Has lost 19 lbs since that time. This is a loss of >10% bw and is clinically significant for the time period.   She said over the last month her intake has declined. She has a decreased appetite, but no other aggravating symptoms. RD asked how many meals she eats a day, but she says she did not know. Will occassionally be nauseated. Periodic constipation/diarrhea, but has IBS. She does not think she has been any more stressed or anxious. She does say that she has stopped drinking regular sodas this past month, but does not believe she had been drinking that many regular sodas in the first place to account for the loss. Unknown reason for loss of appetite.   At baseline, she takes a myriad of vitamins including a mvi, multiple omega 3s, b complex, Vit C, Vit D, Vit E. She did NOT follow a  diabetic diet. She says her BGs at home have been "better", but doesn't give numbers  At this time, she still has a decreased appetite. RD took food preferences to promote increased intake.   NFPE: mild degree of fat wasting apparent in orbitals and triceps.  Labs: K: 3.2, Bgs were in 130s, A1C pending Meds: Glucerna, Insulin, IVF   Recent Labs Lab 06/08/17 2303 06/09/17 0606  NA 140 139  K 3.8 3.2*  CL 106 104  CO2 25 24  BUN 16 13  CREATININE 1.00 0.88  CALCIUM 9.4 9.0  GLUCOSE 126* 129*   Diet Order:  Diet Carb Modified Fluid consistency: Thin; Room service appropriate? Yes  Skin:  Reviewed, no issues  Last BM:  7/30  Height:  Ht Readings from Last 1 Encounters:  06/08/17 '5\' 4"'  (1.626 m)   Weight:  Wt Readings from Last 1 Encounters:  06/09/17 141 lb 5 oz (64.1 kg)   Wt Readings from Last 10 Encounters:  06/09/17 141 lb 5 oz (64.1 kg)  05/25/17 146 lb (66.2 kg)  05/03/17 150 lb (68 kg)  04/24/17 155 lb (70.3 kg)  04/12/17 153 lb (69.4 kg)  01/29/17 160 lb 3.2 oz (72.7 kg)  10/06/16 154 lb (69.9 kg)  10/02/16 154 lb (69.9 kg)  08/10/16 154 lb (69.9 kg)  11/15/15 160 lb 12.8 oz (72.9 kg)   Ideal Body Weight:  54.54 kg  BMI:  Body mass index is 24.26 kg/m.  Estimated Nutritional Needs:  Kcal:  1600-1800 (25-28 kcal/kg bw) Protein:  64-77g Pro (1-1.2 g/kg bw) Fluid:  1.6-1.8 L fluid ( 1 ml/kcal)  EDUCATION NEEDS:  No education needs identified at this time  Burtis Junes RD, LDN, [place] [facility] Pager: [phone] 06/09/2017 11:49 AM

## 2017-06-09 NOTE — Evaluation (Signed)
Clinical/Bedside Swallow Evaluation Patient Details  Name: Shannon [name] MRN: [id number] Date of Birth: 03/20/1951  Today's Date: 06/09/2017 Time: SLP Start Time (ACUTE ONLY): 1001 SLP Stop Time (ACUTE ONLY): 1028 SLP Time Calculation (min) (ACUTE ONLY): 27 min  Past Medical History:  Past Medical History:  Diagnosis Date  . Anxiety   . Arthritis   . Coronary artery disease    a. s/p PCI with DES to LAD in 2011  (LHC 03/2010: Mid LAD 70%, normal ejection fraction  >> PCI:  2.75 x 15 mm Promus DES to LAD); b. Nuclear Stress Test 05/29/14: No ischemia, EF 59%, Normal; c. NSTEMI 11/2014 >> patient declined LHC b/c of recent CVA  . Depression   . Diabetes mellitus without complication (HCC)   . Dyslipidemia 11/20/2014  . Fibromyalgia 1995  . H/O ischemic right PCA stroke 10/2014   a. Carotid US 10/24/14: No significant ICA stenosis bilaterally  . Hemiparesis and alteration of sensations as late effects of stroke (HCC) 06/05/2015  . Hx of echocardiogram    a. Echocardiogram 10/25/14: Moderate LVH, EF 50-55%, normal wall motion, grade 2 diastolic dysfunction, mild LAE, atrial septal lipomatous hypertrophy  . Hypertension   . Irritable bowel    Past Surgical History:  Past Surgical History:  Procedure Laterality Date  . CHOLECYSTECTOMY    . CORONARY STENT PLACEMENT    . orif arm fracture Right    forearm  . TEE WITHOUT CARDIOVERSION N/A 04/12/2017   Procedure: TRANSESOPHAGEAL ECHOCARDIOGRAM (TEE) WITH PROPOFOL;  Surgeon: Jonelle SidleMcDowell, Samuel G, MD;  Location: [facility];  Service: Cardiovascular;  Laterality: N/A;   HPI:  66 y.o. female, with history of right posterior cerebral artery distribution stroke with left-sided numbness and dense left homonymous visual field deficit, recent MRI on 04-10-17 showed multiple foci of acute infarction in left MCA territory consistent with microembolic infarctions, old infarction right PCA territory, TEE showed no cardioembolic source, recent loop recorder  insertion by cardiology on 05/25/2017, diabetes mellitus, hypothyroidism, hypertension came to hospital with dizziness, blurred vision and chest pain; MRI 06/09/17:  There is a new subcentimeter area of acute LEFT hemisphere infarction, precentral gyrus, nonhemorrhagic; CXR on 06/09/17 indicated no acute abnormality  Assessment / Plan / Recommendation Clinical Impression   Pt without overt s/s of aspiration noted during BSE, but she did indicate globus sensation intermittently during intake of solids and larger volumes of liquids; hx of GERD (not medicated) and erosive esophagitis x1 in pmhx per pt; consumed thin via cup/straw-->solids without oropharyngeal difficulty noted; recommend Regular/thin (carb modified) diet resume and SLE recommended d/t recent changes per pt in processing, speech and possible cognition with new infarct identified on MRI on 06/09/17. ST will f/u for SLE and diet tolerance d/t symptoms indicated by pt during BSE. SLP Visit Diagnosis: Dysphagia, unspecified (R13.10)    Aspiration Risk  Mild aspiration risk    Diet Recommendation   Carb modified regular/thin liquids  Medication Administration: Whole meds with liquid    Other  Recommendations Oral Care Recommendations: Oral care BID   Follow up Recommendations Home health SLP;Other (comment) (If unable to complete SLE in acute setting d/t OBS status)      Frequency and Duration min 2x/week  1 week       Prognosis Prognosis for Safe Diet Advancement: Good      Swallow Study   General Date of Onset: 06/08/17 HPI: 66 y.o. female, with history of right posterior cerebral artery distribution stroke with left-sided numbness and dense left  homonymous visual field deficit, recent MRI on 04-10-17 showed multiple foci of acute infarction in left MCA territory consistent with microembolic infarctions, old infarction right PCA territory, TEE showed no cardioembolic source, recent loop recorder insertion by cardiology on 05/25/2017,  diabetes mellitus, hypothyroidism, hypertension came to hospital with dizziness, blurred vision and chest pain. Type of Study: Bedside Swallow Evaluation Previous Swallow Assessment: NSSS passed Diet Prior to this Study: NPO Temperature Spikes Noted: No Respiratory Status: Room air History of Recent Intubation: No Behavior/Cognition: Alert;Cooperative Oral Cavity Assessment: Within Functional Limits Oral Care Completed by SLP: No Oral Cavity - Dentition: Missing dentition Vision: Functional for self-feeding Self-Feeding Abilities: Able to feed self;Needs assist Patient Positioning: Upright in bed Baseline Vocal Quality: Normal Volitional Cough: Strong Volitional Swallow: Able to elicit    Oral/Motor/Sensory Function Overall Oral Motor/Sensory Function: Mild impairment Facial ROM: Within Functional Limits Facial Symmetry: Abnormal symmetry right;Other (Comment) (very slight) Facial Strength: Within Functional Limits Facial Sensation: Within Functional Limits Lingual ROM: Reduced right Lingual Symmetry: Abnormal symmetry right;Other (Comment) (slight) Lingual Strength: Within Functional Limits Lingual Sensation: Within Functional Limits   Ice Chips Ice chips: Within functional limits Presentation: Spoon   Thin Liquid Thin Liquid: Impaired Presentation: Cup;Spoon;Straw Pharyngeal  Phase Impairments: Other (comments) (c/o intermittent globus sensation; primarily with larger amt)    Nectar Thick Nectar Thick Liquid: Not tested   Honey Thick Honey Thick Liquid: Not tested   Puree Puree: Within functional limits   Solid      Solid: Impaired Pharyngeal Phase Impairments: Other (comments) (c/o intermittent globus sensation)    Functional Assessment Tool Used: NOMS; clinical judgment Functional Limitations: Other Speech Language Pathology Other Speech-Language Pathology Functional Limitation Current Status [phone]([id number]): At least 20 percent but less than 40 percent impaired, limited or  restricted Other Speech-Language Pathology Functional Limitation Goal Status [phone]([id number]): At least 20 percent but less than 40 percent impaired, limited or restricted   Tressie StalkerPat Khary Schaben, M.S., CCC-SLP 06/09/2017,10:54 AM

## 2017-06-09 NOTE — Consult Note (Addendum)
Kaanapali A. Merlene Laughter, MD     [email or web address]          Shannon [name] is an 66 y.o. female.   ASSESSMENT/PLAN: 1. The semiology of the patient's symptoms are rather ill-defined and nonspecific. However, imaging shows evidence of another embolic stroke in the setting of previous embolic strokes. This raises the concern for cardioembolic phenomena. She has a loop recorder that is going to be placed in 2 weeks. In the meantime, I think we'll continue with the dual antiplatelet agents. Additional labs will also be obtained for the following: RPR, C-reactive protein, ANA, homocysteine level and vitamin B12 level. An EEG will also be obtained. Brain CTA to evaluate for intracranial occlusive disease will also be obtained.  The patient is a 66 year old white female who presents with rather ill-defined symptoms of feeling dizzy and lightheaded and a sensation as if she's going to pass out or fall to the ground. She apparently has been having this for the last day or 2 but he got really worse today and she decided see medical attention. She reports feeling really fatigued and tired the last few days. She also reports sensation of  being tremulous on the inside and also the outside. She denies any loss of consciousness. She denies focal numbness, weakness, chest pain or shortness of breath. She tells me she has had a few strokes in the past. Her first event in 2015 left ear with impaired vision on the left. She reports that with each successive events she has had loss of different function such as difficulty writing, memory loss and dysarthria. She has a history of stents but denies any chest pain.   GENERAL: The pleasant average white female who is in no acute distress.  HEENT: Poor dentition  ABDOMEN: soft  EXTREMITIES: No edema   BACK: Normal  SKIN: Normal by inspection.    MENTAL STATUS: Alert and oriented - including her age and the month. Speech, language and  cognition are generally intact. Judgment and insight normal.   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  MOTOR: There is no drift of the upper or lower extremities. She has 4+ weakness involving right deltoid and hip flexion bilaterally. Other muscle groups are all normal in strength.  COORDINATION: Left finger to nose is normal, right finger to nose is normal, No rest tremor; no intention tremor; no postural tremor; no bradykinesia.  REFLEXES: Deep tendon reflexes are symmetrical and normal. Babinski reflexes chronically extensor bilaterally.  SENSATION: Normal to light touch, temperature, and pinprick. She does not extinguish to double simultaneous stimulation.   NIHSS 2  The MRI is reviewed in person. There is a tiny cortical infarcts seen on DWI involving the left parietal lobe. There is a questionable area of more extensive involving the white matter left frontal lobe also on DWI with a tiny area of reduced signal seen on ADC. There is a area of tiny microhemorrhages involving the tibia aspect of the right temporal lobe. There is encephalomalacia and surrounding white matter signal involving the right occipital area extended to the medial left temporal lobe.   Blood pressure 119/78, pulse 83, temperature 97.8 F (36.6 C), temperature source Oral, resp.  rate 16, height 5' 4" (1.626 m), weight 141 lb 5 oz (64.1 kg), SpO2 100 %.  Past Medical History:  Diagnosis Date  . Anxiety   . Arthritis   . Coronary artery disease    a. s/p PCI with DES to LAD in 2011  (LHC 03/2010: Mid LAD 70%, normal ejection fraction  >> PCI:  2.75 x 15 mm Promus DES to LAD); b.  Nuclear Stress Test 05/29/14: No ischemia, EF 59%, Normal; c. NSTEMI 11/2014 >> patient declined LHC b/c of recent CVA  . Depression   . Diabetes mellitus without complication (HCC)   . Dyslipidemia 11/20/2014  . Fibromyalgia 1995  . H/O ischemic right PCA stroke 10/2014   a. Carotid US 10/24/14: No significant ICA stenosis bilaterally  . Hemiparesis and alteration of sensations as late effects of stroke (HCC) 06/05/2015  . Hx of echocardiogram    a. Echocardiogram 10/25/14: Moderate LVH, EF 50-55%, normal wall motion, grade 2 diastolic dysfunction, mild LAE, atrial septal lipomatous hypertrophy  . Hypertension   . Irritable bowel     Past Surgical History:  Procedure Laterality Date  . CHOLECYSTECTOMY    . CORONARY STENT PLACEMENT    . orif arm fracture Right    forearm  . TEE WITHOUT CARDIOVERSION N/A 04/12/2017   Procedure: TRANSESOPHAGEAL ECHOCARDIOGRAM (TEE) WITH PROPOFOL;  Surgeon: McDowell, Samuel G, MD;  Location: [facility];  Service: Cardiovascular;  Laterality: N/A;    Family History  Problem Relation Age of Onset  . Cancer Mother   . Diabetes Mother   . Hypertension Mother   . Heart failure Mother   . CAD Mother   . Heart attack Mother   . Cancer Father   . Diabetes Father   . Hypertension Father   . Heart failure Father   . CAD Father   . Heart attack Father   . Stroke Father   . CAD Brother   . Diabetes Brother   . Hypertension Brother   . CAD Sister   . Diabetes Sister   . Hypertension Sister   . Heart attack Sister   . Heart attack Brother   . Stroke Maternal Aunt     Social History:  reports that she has never smoked. She has never used smokeless tobacco. She reports that she does not drink alcohol or use drugs.  Allergies:  Allergies  Allergen Reactions  . Insulins Other (See Comments)    Causes severe diarrhea  . Sulfa Antibiotics Other (See Comments)    Childhood allergy, can't remember  . Lisinopril Cough    Medications: Prior to  Admission medications   Medication Sig Start Date End Date Taking? Authorizing Provider  amLODipine (NORVASC) 10 MG tablet Take 10 mg by mouth daily. 09/11/16  Yes [provider]  Ascorbic Acid (VITAMIN C) 100 MG tablet Take 100 mg by mouth daily.   Yes [provider]  aspirin EC 81 MG tablet Take 1 tablet (81 mg total) by mouth daily. 11/27/14  Yes Weaver, Scott T, PA-C  atorvastatin (LIPITOR) 80 MG tablet Take 1 tablet (80 mg total) by mouth daily. 05/03/17  Yes Martin, Nancy Carolyn, NP  b complex vitamins tablet Take 1 tablet by mouth daily.   Yes [provider]  Cholecalciferol (VITAMIN D-3) 1000 UNITS CAPS Take 1 capsule by mouth daily.   Yes [provider]  Chromium 200 MCG CAPS Take 1 capsule by mouth daily.   Yes [provider]  clopidogrel (PLAVIX) 75 MG   tablet TAKE 1 TABLET (75 MG TOTAL) BY MOUTH DAILY WITH BREAKFAST. 02/19/17  Yes Skains, Mark C, MD  co-enzyme Q-10 30 MG capsule Take 30 mg by mouth daily.   Yes [provider]  FENUGREEK PO Take 1 capsule by mouth daily.   Yes [provider]  gabapentin (NEURONTIN) 400 MG capsule Take 400 mg by mouth 2 (two) times daily. 07/29/16  Yes [provider]  GARLIC PO Take 1 capsule by mouth daily.   Yes [provider]  glimepiride (AMARYL) 4 MG tablet Take 4 mg by mouth 2 (two) times daily. 09/03/16  Yes [provider]  hydrOXYzine (ATARAX/VISTARIL) 25 MG tablet Take 25 mg by mouth 3 (three) times daily as needed for anxiety.    Yes [provider]  ibuprofen (ADVIL,MOTRIN) 200 MG tablet Take 200-400 mg by mouth every 6 (six) hours as needed.   Yes [provider]  isosorbide mononitrate (IMDUR) 30 MG 24 hr tablet Take 1 tablet (30 mg total) by mouth daily. DUE APPT IN AUGUST, MAKE APPT FOR FURTHER REFILLS, CALL [phone] TO SCHEDULE 04/16/17  Yes Skains, Mark C, MD  JANUVIA 100 MG tablet Take 100 mg by mouth daily. 09/11/16  Yes  [provider]  Lancets (FREESTYLE) lancets Check blood sugar four times per day. Before each meal and at bedtime 07/19/14  Yes Keck, Valerie A, NP  levothyroxine (SYNTHROID, LEVOTHROID) 25 MCG tablet TAKE 1 TABLET (25 MCG TOTAL) BY MOUTH DAILY BEFORE BREAKFAST. 07/30/15  Yes Skains, Mark C, MD  losartan (COZAAR) 100 MG tablet Take 100 mg by mouth daily. 07/29/16  Yes [provider]  metoprolol tartrate (LOPRESSOR) 25 MG tablet TAKE 1 TABLET (25 MG TOTAL) BY MOUTH 2 (TWO) TIMES DAILY. 02/20/16  Yes Skains, Mark C, MD  Multiple Vitamin (MULTIVITAMIN) capsule Take 1 capsule by mouth daily. Senior vitamin   Yes [provider]  nitroGLYCERIN (NITROSTAT) 0.4 MG SL tablet Place 1 tablet (0.4 mg total) under the tongue every 5 (five) minutes as needed. 11/27/14  Yes Weaver, Scott T, PA-C  omega-3 acid ethyl esters (LOVAZA) 1 g capsule Take 1 g by mouth daily.   Yes [provider]  temazepam (RESTORIL) 30 MG capsule Take 1 capsule by mouth daily. 05/27/17  Yes [provider]  vitamin E (VITAMIN E) 400 UNIT capsule Take 400 Units by mouth every morning.   Yes [provider]    Scheduled Meds: . aspirin EC  81 mg Oral Daily  . atorvastatin  80 mg Oral Daily  . clopidogrel  75 mg Oral Daily  . enoxaparin (LOVENOX) injection  40 mg Subcutaneous Q24H  . feeding supplement (GLUCERNA SHAKE)  237 mL Oral BID BM  . gabapentin  400 mg Oral BID  . insulin aspart  0-9 Units Subcutaneous TID WC  . isosorbide mononitrate  30 mg Oral Daily  . levothyroxine  25 mcg Oral QAC breakfast  . temazepam  30 mg Oral QHS   Continuous Infusions: . sodium chloride 75 mL/hr at 06/09/17 1400   PRN Meds:.acetaminophen **OR** acetaminophen, hydrOXYzine, ondansetron **OR** ondansetron (ZOFRAN) IV     Results for orders placed or performed during the hospital encounter of 06/08/17 (from the past 48 hour(s))  Basic metabolic panel     Status: Abnormal   Collection Time:  06/08/17 11:03 PM  Result Value Ref Range   Sodium 140 135 - 145 mmol/L   Potassium 3.8 3.5 - 5.1 mmol/L   Chloride 106 101 -   111 mmol/L   CO2 25 22 - 32 mmol/L   Glucose, Bld 126 (H) 65 - 99 mg/dL   BUN 16 6 - 20 mg/dL   Creatinine, Ser 1.00 0.44 - 1.00 mg/dL   Calcium 9.4 8.9 - 10.3 mg/dL   GFR calc non Af Amer 57 (L) >60 mL/min   GFR calc Af Amer >60 >60 mL/min    Comment: (NOTE) The eGFR has been calculated using the CKD EPI equation. This calculation has not been validated in all clinical situations. eGFR's persistently <60 mL/min signify possible Chronic Kidney Disease.    Anion gap 9 5 - 15  CBC     Status: None   Collection Time: 06/08/17 11:03 PM  Result Value Ref Range   WBC 9.4 4.0 - 10.5 K/uL   RBC 5.00 3.87 - 5.11 MIL/uL   Hemoglobin 13.9 12.0 - 15.0 g/dL   HCT 41.0 36.0 - 46.0 %   MCV 82.0 78.0 - 100.0 fL   MCH 27.8 26.0 - 34.0 pg   MCHC 33.9 30.0 - 36.0 g/dL   RDW 12.9 11.5 - 15.5 %   Platelets 197 150 - 400 K/uL  I-stat troponin, ED     Status: None   Collection Time: 06/08/17 11:23 PM  Result Value Ref Range   Troponin i, poc 0.01 0.00 - 0.08 ng/mL   Comment 3            Comment: Due to the release kinetics of cTnI, a negative result within the first hours of the onset of symptoms does not rule out myocardial infarction with certainty. If myocardial infarction is still suspected, repeat the test at appropriate intervals.   I-stat troponin, ED     Status: None   Collection Time: 06/09/17  2:00 AM  Result Value Ref Range   Troponin i, poc 0.00 0.00 - 0.08 ng/mL   Comment 3            Comment: Due to the release kinetics of cTnI, a negative result within the first hours of the onset of symptoms does not rule out myocardial infarction with certainty. If myocardial infarction is still suspected, repeat the test at appropriate intervals.   CBC     Status: None   Collection Time: 06/09/17  6:06 AM  Result Value Ref Range   WBC 9.6 4.0 - 10.5 K/uL    RBC 5.11 3.87 - 5.11 MIL/uL   Hemoglobin 14.1 12.0 - 15.0 g/dL   HCT 41.9 36.0 - 46.0 %   MCV 82.0 78.0 - 100.0 fL   MCH 27.6 26.0 - 34.0 pg   MCHC 33.7 30.0 - 36.0 g/dL   RDW 12.9 11.5 - 15.5 %   Platelets 204 150 - 400 K/uL  Comprehensive metabolic panel     Status: Abnormal   Collection Time: 06/09/17  6:06 AM  Result Value Ref Range   Sodium 139 135 - 145 mmol/L   Potassium 3.2 (L) 3.5 - 5.1 mmol/L   Chloride 104 101 - 111 mmol/L   CO2 24 22 - 32 mmol/L   Glucose, Bld 129 (H) 65 - 99 mg/dL   BUN 13 6 - 20 mg/dL   Creatinine, Ser 0.88 0.44 - 1.00 mg/dL   Calcium 9.0 8.9 - 10.3 mg/dL   Total Protein 6.5 6.5 - 8.1 g/dL   Albumin 3.7 3.5 - 5.0 g/dL   AST 21 15 - 41 U/L   ALT 20 14 - 54 U/L   Alkaline Phosphatase 59   38 - 126 U/L   Total Bilirubin 0.7 0.3 - 1.2 mg/dL   GFR calc non Af Amer >60 >60 mL/min   GFR calc Af Amer >60 >60 mL/min    Comment: (NOTE) The eGFR has been calculated using the CKD EPI equation. This calculation has not been validated in all clinical situations. eGFR's persistently <60 mL/min signify possible Chronic Kidney Disease.    Anion gap 11 5 - 15  Troponin I (q 6hr x 3)     Status: None   Collection Time: 06/09/17  6:06 AM  Result Value Ref Range   Troponin I <0.03 <0.03 ng/mL  Glucose, capillary     Status: Abnormal   Collection Time: 06/09/17  6:36 AM  Result Value Ref Range   Glucose-Capillary 131 (H) 65 - 99 mg/dL   Comment 1 Notify RN    Comment 2 Document in Chart   Glucose, capillary     Status: Abnormal   Collection Time: 06/09/17 11:11 AM  Result Value Ref Range   Glucose-Capillary 308 (H) 65 - 99 mg/dL   Comment 1 Notify RN    Comment 2 Document in Chart   Troponin I (q 6hr x 3)     Status: None   Collection Time: 06/09/17 12:13 PM  Result Value Ref Range   Troponin I <0.03 <0.03 ng/mL  Glucose, capillary     Status: Abnormal   Collection Time: 06/09/17  4:16 PM  Result Value Ref Range   Glucose-Capillary 119 (H) 65 - 99 mg/dL    Comment 1 Notify RN    Comment 2 Document in Chart     Studies/Results:    BRAIN MRI 04-2017 Brain: Multiple (approximately 20) foci of acute infarction in the left hemisphere within the middle cerebral artery territory, the largest being a 1.5 cm region visible on CT in the left frontoparietal white matter. Findings likely represent micro embolic infarctions. No other vascular territory acute finding. Brainstem and cerebellum are normal. Old infarction in the right PCA territory as seen previously. No mass lesion, hemorrhage, hydrocephalus or extra-axial collection.   Vascular: Major vessels at the base of the brain show flow.   Skull and upper cervical spine: Negative   Sinuses/Orbits: Clear/normal   Other: None   IMPRESSION: Multiple foci of acute infarction in the left MCA territory consistent with micro embolic infarctions. No significant swelling, mass effect or hemorrhage.   Old infarction in the right PCA territory.   CAROTID DOPPLERS IMPRESSION: 1. Minimal amount of right-sided atherosclerotic plaque results in elevated peak systolic velocities within the right internal carotid artery compatible with the lower end of the 50-69% luminal narrowing range. Further evaluation with CTA could performed as clinically indicated. 2. Large amount of left-sided atherosclerotic plaque, morphologically results in at least 50% luminal narrowing though does not definitely result in elevated peak systolic velocities within the left internal carotid artery to suggest a hemodynamically significant narrowing. Again, this discrepancy could be further evaluated with CTA as indicated.   TEE - Left ventricle: Systolic function was normal. The estimated   ejection fraction was in the range of 55% to 60%. Wall motion was   normal; there were no regional wall motion abnormalities. - Descending aorta: Mild, scattered atherosclerosis noted. - Mitral valve: There was trivial  regurgitation. - Left atrium: The atrium was dilated. No evidence of thrombus in   the atrial cavity or appendage. Emptying velocity was normal. - Right atrium: No evidence of thrombus in the atrial cavity or     appendage. - Atrial septum: No defect or patent foramen ovale was identified.   No evidence of interatrial shunt by color flow Doppler imaging or   agitated saline injection. - Tricuspid valve: There was trivial regurgitation.   Impressions:   - LVEF 55-60% without wall motion abnormalities. No evidence of   left or right atrial appendage thrombus. No ASD or PFO based on   color flow Doppler and agitated saline injection. Mild scattered   aortic atherosclerosis noted.     BRAIN MRI/MRA 2015 Acute infarction in the right PCA territory affecting the posterior medial temporal lobe, occipital lobe, hippocampus and to a minimal extent the right thalamus. No mass effect or hemorrhage. MR angiography shows occlusion or markedly diminished flow in the right posterior cerebral artery. Additionally, there is narrowing and irregularity in the proximal anterior and middle cerebral arteries, placing the patient at risk of anterior circulation infarction in the future.  Despite this extensive vascular disease, this acute right posterior cerebral artery territory infarction appears to be the patient's only previous cerebrovascular event.            Derico Mitton A. Merlene Laughter, M.D.  Diplomate, [employer] adviser of Psychiatry and Neurology ( Neurology). 06/09/2017, 5:37 PM

## 2017-06-09 NOTE — H&P (Signed)
TRH H&P    Patient Demographics:    Shannon [name], is a 66 y.o. female  MRN: [id number]  DOB - 04/26/1951  Admit Date - 06/08/2017  Referring MD/NP/PA: Dr Lynelle DoctorKnapp  Outpatient Primary MD for the patient is Nila NephewGreen, Edwin, MD  Patient coming from:  Home  Chief Complaint  Patient presents with  . Near Syncope      HPI:    Shannon [name]  is a 66 y.o. female, with history of right posterior cerebral artery distribution stroke with left-sided numbness and dense left homonymous visual field deficit, recent MRI on 04-10-17 showed multiple foci of acute infarction in left MCA territory consistent with microembolic infarctions, old infarction right PCA territory, TEE showed no cardioembolic source, recent loop recorder insertion by cardiology on 05/25/2017, diabetes mellitus, hypothyroidism, hypertension came to hospital with dizziness, blurred vision and chest pain. Patient says that her vision has been not good and she also had weakness of right upper extremity over the past 2 weeks that she cannot hold things in her right hand. Complains of dizziness while walking and have trouble keeping her balance.  Patient had very brief episode of chest pain which is now resolved. She admits to mild shortness of breath. She denies nausea vomiting or diarrhea. No fever or dysuria.    Review of systems:      All other systems reviewed and are negative.   With Past History of the following :    Past Medical History:  Diagnosis Date  . Anxiety   . Arthritis   . Coronary artery disease    a. s/p PCI with DES to LAD in 2011  (LHC 03/2010: Mid LAD 70%, normal ejection fraction  >> PCI:  2.75 x 15 mm Promus DES to LAD); b. Nuclear Stress Test 05/29/14: No ischemia, EF 59%, Normal; c. NSTEMI 11/2014 >> patient declined LHC b/c of recent CVA  . Depression   . Diabetes mellitus without complication (HCC)   . Dyslipidemia 11/20/2014  .  Fibromyalgia 1995  . H/O ischemic right PCA stroke 10/2014   a. Carotid US 10/24/14: No significant ICA stenosis bilaterally  . Hemiparesis and alteration of sensations as late effects of stroke (HCC) 06/05/2015  . Hx of echocardiogram    a. Echocardiogram 10/25/14: Moderate LVH, EF 50-55%, normal wall motion, grade 2 diastolic dysfunction, mild LAE, atrial septal lipomatous hypertrophy  . Hypertension   . Irritable bowel       Past Surgical History:  Procedure Laterality Date  . CHOLECYSTECTOMY    . CORONARY STENT PLACEMENT    . orif arm fracture Right    forearm  . TEE WITHOUT CARDIOVERSION N/A 04/12/2017   Procedure: TRANSESOPHAGEAL ECHOCARDIOGRAM (TEE) WITH PROPOFOL;  Surgeon: Jonelle SidleMcDowell, Samuel G, MD;  Location: [facility];  Service: Cardiovascular;  Laterality: N/A;      Social History:      Social History  Substance Use Topics  . Smoking status: Never Smoker  . Smokeless tobacco: Never Used  . Alcohol use No       Family History :  Family History  Problem Relation Age of Onset  . Cancer Mother   . Diabetes Mother   . Hypertension Mother   . Heart failure Mother   . CAD Mother   . Heart attack Mother   . Cancer Father   . Diabetes Father   . Hypertension Father   . Heart failure Father   . CAD Father   . Heart attack Father   . Stroke Father   . CAD Brother   . Diabetes Brother   . Hypertension Brother   . CAD Sister   . Diabetes Sister   . Hypertension Sister   . Heart attack Sister   . Heart attack Brother   . Stroke Maternal Aunt       Home Medications:   Prior to Admission medications   Medication Sig Start Date End Date Taking? Authorizing Provider  amLODipine (NORVASC) 10 MG tablet Take 10 mg by mouth daily. 09/11/16   [provider]  Ascorbic Acid (VITAMIN C) 100 MG tablet Take 100 mg by mouth daily.    [provider]  aspirin EC 81 MG tablet Take 1 tablet (81 mg total) by mouth daily. 11/27/14   Tereso NewcomerWeaver, Scott T,  PA-C  atorvastatin (LIPITOR) 80 MG tablet Take 1 tablet (80 mg total) by mouth daily. 05/03/17   Nilda RiggsMartin, Nancy Carolyn, NP  Cholecalciferol (VITAMIN D-3) 1000 UNITS CAPS Take 1 capsule by mouth daily.    [provider]  clopidogrel (PLAVIX) 75 MG tablet TAKE 1 TABLET (75 MG TOTAL) BY MOUTH DAILY WITH BREAKFAST. 02/19/17   Jake BatheSkains, Mark C, MD  gabapentin (NEURONTIN) 400 MG capsule Take 400 mg by mouth 2 (two) times daily. 07/29/16   [provider]  GARLIC PO Take 1 capsule by mouth daily.    [provider]  glimepiride (AMARYL) 4 MG tablet Take 4 mg by mouth 2 (two) times daily. 09/03/16   [provider]  hydrOXYzine (ATARAX/VISTARIL) 25 MG tablet Take 25 mg by mouth 3 (three) times daily as needed for anxiety.     [provider]  ibuprofen (ADVIL,MOTRIN) 200 MG tablet Take 200-400 mg by mouth every 6 (six) hours as needed.    [provider]  isosorbide mononitrate (IMDUR) 30 MG 24 hr tablet Take 1 tablet (30 mg total) by mouth daily. DUE APPT IN AUGUST, MAKE APPT FOR FURTHER REFILLS, CALL [phone] TO SCHEDULE 04/16/17   Jake BatheSkains, Mark C, MD  JANUVIA 100 MG tablet Take 100 mg by mouth daily. 09/11/16   [provider]  Lancets (FREESTYLE) lancets Check blood sugar four times per day. Before each meal and at bedtime 07/19/14   Holland CommonsKeck, Valerie A, NP  levothyroxine (SYNTHROID, LEVOTHROID) 25 MCG tablet TAKE 1 TABLET (25 MCG TOTAL) BY MOUTH DAILY BEFORE BREAKFAST. 07/30/15   Jake BatheSkains, Mark C, MD  losartan (COZAAR) 100 MG tablet Take 100 mg by mouth daily. 07/29/16   [provider]  metoprolol tartrate (LOPRESSOR) 25 MG tablet TAKE 1 TABLET (25 MG TOTAL) BY MOUTH 2 (TWO) TIMES DAILY. 02/20/16   Jake BatheSkains, Mark C, MD  Multiple Vitamin (MULTIVITAMIN) capsule Take 1 capsule by mouth daily. Senior vitamin    [provider]  nitroGLYCERIN (NITROSTAT) 0.4 MG SL tablet Place 1 tablet (0.4 mg total) under the tongue every 5 (five) minutes as  needed. 11/27/14   Tereso NewcomerWeaver, Scott T, PA-C  temazepam (RESTORIL) 15 MG capsule Take 30 mg by mouth at bedtime.     [provider]  vitamin  E (VITAMIN E) 400 UNIT capsule Take 400 Units by mouth every morning.    [provider]     Allergies:     Allergies  Allergen Reactions  . Insulins Other (See Comments)    Causes severe diarrhea  . Sulfa Antibiotics Other (See Comments)    Childhood allergy, can't remember  . Lisinopril Cough     Physical Exam:   Vitals  Blood pressure (!) 146/76, pulse 60, temperature (!) 97.5 F (36.4 C), resp. rate 11, height 5\' 4"  (1.626 m), weight 66.2 kg (146 lb), SpO2 96 %.  1.  General: Appears in no acute distress  2. Psychiatric:  Intact judgement and  insight, awake alert, oriented x 3.  3. Neurologic: No focal neurological deficits, all cranial nerves intact.Strength 5/5 all 4 extremities, sensation intact all 4 extremities, plantars down going.  4. Eyes :  anicteric sclerae, moist conjunctivae with no lid lag. PERRLA.  5. ENMT:  Oropharynx clear with moist mucous membranes and good dentition  6. Neck:  supple, no cervical lymphadenopathy appriciated, No thyromegaly  7. Respiratory : Normal respiratory effort, good air movement bilaterally,clear to  auscultation bilaterally  8. Cardiovascular : RRR, no gallops, rubs or murmurs, no leg edema  9. Gastrointestinal:  Positive bowel sounds, abdomen soft, non-tender to palpation,no hepatosplenomegaly, no rigidity or guarding       10. Skin:  No cyanosis, normal texture and turgor, no rash, lesions or ulcers  11.Musculoskeletal:  Good muscle tone,  joints appear normal , no effusions,  normal range of motion    Data Review:    CBC  Recent Labs Lab 06/08/17 2303  WBC 9.4  HGB 13.9  HCT 41.0  PLT 197  MCV 82.0  MCH 27.8  MCHC 33.9  RDW 12.9    ------------------------------------------------------------------------------------------------------------------  Chemistries   Recent Labs Lab 06/08/17 2303  NA 140  K 3.8  CL 106  CO2 25  GLUCOSE 126*  BUN 16  CREATININE 1.00  CALCIUM 9.4   ------------------------------------------------------------------------------------------------------------------  ------------------------------------------------------------------------------------------------------------------ GFR: Estimated Creatinine Clearance: 50.4 mL/min (by C-G formula based on SCr of 1 mg/dL). Liver Function Tests: No results for input(s): AST, ALT, ALKPHOS, BILITOT, PROT, ALBUMIN in the last 168 hours. No results for input(s): LIPASE, AMYLASE in the last 168 hours. No results for input(s): AMMONIA in the last 168 hours. Coagulation Profile: No results for input(s): INR, PROTIME in the last 168 hours. Cardiac Enzymes: No results for input(s): CKTOTAL, CKMB, CKMBINDEX, TROPONINI in the last 168 hours. BNP (last 3 results) No results for input(s): PROBNP in the last 8760 hours. HbA1C: No results for input(s): HGBA1C in the last 72 hours. CBG: No results for input(s): GLUCAP in the last 168 hours. Lipid Profile: No results for input(s): CHOL, HDL, LDLCALC, TRIG, CHOLHDL, LDLDIRECT in the last 72 hours. Thyroid Function Tests: No results for input(s): TSH, T4TOTAL, FREET4, T3FREE, THYROIDAB in the last 72 hours. Anemia Panel: No results for input(s): VITAMINB12, FOLATE, FERRITIN, TIBC, IRON, RETICCTPCT in the last 72 hours.  --------------------------------------------------------------------------------------------------------------- Urine analysis:    Component Value Date/Time   COLORURINE COLORLESS (A) 04/24/2017 2039   APPEARANCEUR CLEAR 04/24/2017 2039   LABSPEC 1.001 (L) 04/24/2017 2039   PHURINE 6.0 04/24/2017 2039   GLUCOSEU NEGATIVE 04/24/2017 2039   HGBUR NEGATIVE 04/24/2017 2039    BILIRUBINUR NEGATIVE 04/24/2017 2039   BILIRUBINUR neg 04/13/2014 1733   KETONESUR NEGATIVE 04/24/2017 2039   PROTEINUR NEGATIVE 04/24/2017 2039   UROBILINOGEN 0.2 04/13/2014 1733   NITRITE NEGATIVE  04/24/2017 2039   LEUKOCYTESUR SMALL (A) 04/24/2017 2039      Imaging Results:    Dg Chest 2 View  Result Date: 06/09/2017 CLINICAL DATA:  Dizziness and chest tightness for the past 2 days. Shortness of breath. EXAM: CHEST  2 VIEW COMPARISON:  04/09/2017. FINDINGS: Normal sized heart. Clear lungs with normal vascularity. Unremarkable bones. IMPRESSION: No acute abnormality. Electronically Signed   By: Beckie Salts M.D.   On: 06/09/2017 00:03   Ct Head Wo Contrast  Result Date: 06/09/2017 CLINICAL DATA:  Dizziness and blurred vision since 06/07/2017. History of stroke. EXAM: CT HEAD WITHOUT CONTRAST TECHNIQUE: Contiguous axial images were obtained from the base of the skull through the vertex without intravenous contrast. COMPARISON:  04/24/2017 FINDINGS: Brain: Patchy low-attenuation areas throughout the deep white matter consistent with small vessel ischemia. Old infarcts demonstrated in the right occipital lobe, left posterior parietal lobe, and left cerebellum. No changes since previous study. No mass effect or midline shift. No abnormal extra-axial fluid collections. Gray-white matter junctions are distinct. Basal cisterns are not effaced. No ventricular dilatation. No evidence of acute intracranial hemorrhage. Vascular: Vascular calcifications are present. Skull: No depressed skull fractures. Sinuses/Orbits: No acute finding. Other: None. IMPRESSION: No acute intracranial abnormalities. Chronic small vessel ischemic changes. Old infarcts in the right occipital, left posterior parietal, and left cerebellar regions. Electronically Signed   By: Burman Nieves M.D.   On: 06/09/2017 01:25    My personal review of EKG: Rhythm NSR   Assessment & Plan:    Active Problems:   DM type 2 (diabetes  mellitus, type 2) (HCC)   Weakness   Hypothyroidism   Visual disturbance   Dizziness   1. Dizziness/weakness/blurred vision- patient again presenting with dizziness/weakness, blurred vision. CT head was negative for acute abnormality, showed old infarct in right occipital, left posterior parietal and left cerebellar lesions. ED physician spoke to tele neurologist, who recommended MRI brain. Will order MRI brain in a.m. Patient recently had TEE which was negative for cardiac embolic source. 2. Chest pain- brief episode, resolved at this time. We will cycle troponin every 6 hours 3. 3. Diabetes mellitus-hold glimepiride, start sliding scale insulin with NovoLog. 4. History of CVA- continue aspirin, Plavix. 5. Hypothyroidism-continue Synthroid 6. Hypertension- will hold antidepressant medications at this time for permissive hypertension 7. CAD status post PCI with 2 DES to LAD in 2011- continue aspirin, Plavix, Imdur.   DVT Prophylaxis-   Lovenox   AM Labs Ordered, also please review Full Orders  Family Communication: Admission, patients condition and plan of care including tests being ordered have been discussed with the patient  who indicate understanding and agree with the plan and Code Status.  Code Status:  DO NOT RESUSCITATE  Admission status: Observation  Time spent in minutes : 60 minutes   LAMA,GAGAN S M.D on 06/09/2017 at 4:53 AM  Between 7am to 7pm - Pager - [phone]. After 7pm go to [email or web address] - password [facility]  [facility] - Office  [phone]

## 2017-06-10 ENCOUNTER — Encounter (HOSPITAL_COMMUNITY): Payer: Self-pay | Admitting: Radiology

## 2017-06-10 ENCOUNTER — Inpatient Hospital Stay (HOSPITAL_COMMUNITY)
Admit: 2017-06-10 | Discharge: 2017-06-10 | Disposition: A | Discharge status: Home / Self Care | Payer: Medicare HMO | Attending: Neurology | Admitting: Neurology

## 2017-06-10 ENCOUNTER — Inpatient Hospital Stay (HOSPITAL_COMMUNITY): Payer: Medicare HMO

## 2017-06-10 DIAGNOSIS — R42 Dizziness and giddiness: Secondary | ICD-10-CM

## 2017-06-10 DIAGNOSIS — E032 Hypothyroidism due to medicaments and other exogenous substances: Secondary | ICD-10-CM

## 2017-06-10 DIAGNOSIS — I639 Cerebral infarction, unspecified: Secondary | ICD-10-CM

## 2017-06-10 LAB — GLUCOSE, CAPILLARY
GLUCOSE-CAPILLARY: 151 mg/dL — AB (ref 65–99)
GLUCOSE-CAPILLARY: 164 mg/dL — AB (ref 65–99)
Glucose-Capillary: 170 mg/dL — ABNORMAL HIGH (ref 65–99)
Glucose-Capillary: 171 mg/dL — ABNORMAL HIGH (ref 65–99)

## 2017-06-10 LAB — HEMOGLOBIN A1C
HEMOGLOBIN A1C: 6.8 % — AB (ref 4.8–5.6)
MEAN PLASMA GLUCOSE: 148 mg/dL

## 2017-06-10 LAB — RPR: RPR Ser Ql: NONREACTIVE

## 2017-06-10 LAB — ANTINUCLEAR ANTIBODIES, IFA: ANA Ab, IFA: NEGATIVE

## 2017-06-10 MED ORDER — LORAZEPAM 0.5 MG PO TABS
0.5000 mg | ORAL_TABLET | ORAL | Status: DC | PRN
  Administered 2017-06-10 – 2017-06-12 (×3): 0.5 mg via ORAL
  Filled 2017-06-10 (×3): qty 1

## 2017-06-10 MED ORDER — IOPAMIDOL (ISOVUE-370) INJECTION 76%
75.0000 mL | Freq: Once | INTRAVENOUS | Status: AC | PRN
  Administered 2017-06-10: 75 mL via INTRAVENOUS

## 2017-06-10 MED ORDER — LEVETIRACETAM 250 MG PO TABS
250.0000 mg | ORAL_TABLET | Freq: Two times a day (BID) | ORAL | Status: DC
  Administered 2017-06-10 – 2017-06-12 (×4): 250 mg via ORAL
  Filled 2017-06-10 (×4): qty 1

## 2017-06-10 MED ORDER — CYANOCOBALAMIN 1000 MCG/ML IJ SOLN: 1000.0000 ug | Freq: Once | INTRAMUSCULAR | Status: DC

## 2017-06-10 MED ORDER — CYANOCOBALAMIN 1000 MCG/ML IJ SOLN
1000.0000 ug | Freq: Every day | INTRAMUSCULAR | Status: DC
  Administered 2017-06-11 – 2017-06-12 (×2): 1000 ug via INTRAMUSCULAR
  Filled 2017-06-10 (×2): qty 1

## 2017-06-10 NOTE — Care Management Note (Signed)
Case Management Note  Patient Details  Name: Shannon [name] MRN: [id number] Date of Birth: 05/22/1951  Subjective/Objective:                  Admitted with new CVA. Pt is from home she lives alone but in the same trailer part as her son and he is with her often. Pt is ind with ADL's. She has a walker but uses a cane in the home, states she leave home very seldom. [facility], [facility] are aware of [facility]. Pt plans to resume services when she returns home.   Action/Plan: Plan for return home with resumption of HH services. CM will provide [facility] updates on DC.   Expected Discharge Date:       06/11/2017           Expected Discharge Plan:  Home w Home Health Services  In-House Referral:  NA  Discharge planning Services  CM Consult  Post Acute Care Choice:  Home Health, Resumption of Svcs/PTA Provider Choice offered to:  Patient  HH Arr[facility]  [facility], [facility]  Status of Service:  In process, will continue to follow  Malcolm MetroChildress, Shakeisha Horine Demske, RN 06/10/2017, 2:39 PM

## 2017-06-10 NOTE — Procedures (Signed)
[facility] Shannon Spilker A. Gerilyn Pilgrimoonquah, MD     [email or web address]           HISTORY: The patient is [age] and presents with recurrent spells are suspicious for complex partial seizures.  MEDICATIONS: Scheduled Meds: . aspirin EC  81 mg Oral Daily  . atorvastatin  80 mg Oral Daily  . clopidogrel  75 mg Oral Daily  . cyanocobalamin  1,000 mcg Intramuscular Daily  . enoxaparin (LOVENOX) injection  40 mg Subcutaneous Q24H  . feeding supplement (GLUCERNA SHAKE)  237 mL Oral BID BM  . gabapentin  400 mg Oral BID  . insulin aspart  0-9 Units Subcutaneous TID WC  . isosorbide mononitrate  30 mg Oral Daily  . levothyroxine  25 mcg Oral QAC breakfast  . temazepam  30 mg Oral QHS   Continuous Infusions: . sodium chloride 75 mL/hr at 06/09/17 1400   PRN Meds:.acetaminophen **OR** acetaminophen, hydrOXYzine, LORazepam, ondansetron **OR** ondansetron (ZOFRAN) IV  Prior to Admission medications   Medication Sig Start Date End Date Taking? Authorizing Provider  amLODipine (NORVASC) 10 MG tablet Take 10 mg by mouth daily. 09/11/16  Yes [provider]  Ascorbic Acid (VITAMIN C) 100 MG tablet Take 100 mg by mouth daily.   Yes [provider]  aspirin EC 81 MG tablet Take 1 tablet (81 mg total) by mouth daily. 11/27/14  Yes Weaver, Scott T, PA-C  atorvastatin (LIPITOR) 80 MG tablet Take 1 tablet (80 mg total) by mouth daily. 05/03/17  Yes Nilda RiggsMartin, Nancy Carolyn, NP  b complex vitamins tablet Take 1 tablet by mouth daily.   Yes [provider]  Cholecalciferol (VITAMIN D-3) 1000 UNITS CAPS Take 1 capsule by mouth daily.   Yes [provider]  Chromium 200 MCG CAPS Take 1 capsule by mouth daily.   Yes [provider]  clopidogrel (PLAVIX) 75 MG tablet TAKE 1 TABLET (75 MG TOTAL) BY MOUTH DAILY WITH BREAKFAST. 02/19/17  Yes Jake BatheSkains, Mark C, MD  co-enzyme Q-10 30 MG capsule Take 30 mg by mouth daily.   Yes [provider]  FENUGREEK PO Take 1 capsule  by mouth daily.   Yes [provider]  gabapentin (NEURONTIN) 400 MG capsule Take 400 mg by mouth 2 (two) times daily. 07/29/16  Yes [provider]  GARLIC PO Take 1 capsule by mouth daily.   Yes [provider]  glimepiride (AMARYL) 4 MG tablet Take 4 mg by mouth 2 (two) times daily. 09/03/16  Yes [provider]  hydrOXYzine (ATARAX/VISTARIL) 25 MG tablet Take 25 mg by mouth 3 (three) times daily as needed for anxiety.    Yes [provider]  ibuprofen (ADVIL,MOTRIN) 200 MG tablet Take 200-400 mg by mouth every 6 (six) hours as needed.   Yes [provider]  isosorbide mononitrate (IMDUR) 30 MG 24 hr tablet Take 1 tablet (30 mg total) by mouth daily. DUE APPT IN AUGUST, MAKE APPT FOR FURTHER REFILLS, CALL [phone] TO SCHEDULE 04/16/17  Yes Skains, Veverly FellsMark C, MD  JANUVIA 100 MG tablet Take 100 mg by mouth daily. 09/11/16  Yes [provider]  Lancets (FREESTYLE) lancets Check blood sugar four times per day. Before each meal and at bedtime 07/19/14  Yes Holland CommonsKeck, Valerie A, NP  levothyroxine (SYNTHROID, LEVOTHROID) 25 MCG tablet TAKE 1 TABLET (25 MCG TOTAL) BY MOUTH DAILY BEFORE BREAKFAST. 07/30/15  Yes Jake BatheSkains, Mark C, MD  losartan (COZAAR) 100 MG tablet Take 100 mg by mouth daily. 07/29/16  Yes [provider]  metoprolol tartrate (LOPRESSOR) 25 MG tablet TAKE 1 TABLET (25 MG TOTAL) BY MOUTH 2 (TWO) TIMES DAILY. 02/20/16  Yes Jake BatheSkains, Mark C, MD  Multiple Vitamin (MULTIVITAMIN) capsule Take 1 capsule by mouth daily. Senior vitamin   Yes [provider]  nitroGLYCERIN (NITROSTAT) 0.4 MG SL tablet Place 1 tablet (0.4 mg total) under the tongue every 5 (five) minutes as needed. 11/27/14  Yes Weaver, Scott T, PA-C  omega-3 acid ethyl esters (LOVAZA) 1 g capsule Take 1 g by mouth daily.   Yes [provider]  temazepam (RESTORIL) 30 MG capsule Take 1 capsule by mouth daily. 05/27/17  Yes [provider]  vitamin E  (VITAMIN E) 400 UNIT capsule Take 400 Units by mouth every morning.   Yes [provider]      ANALYSIS: A 16 channel recording using standard 10 20 measurements is conducted for 21 minutes. There is a well-formed posterior dominant rhythm of 10 Hz which attenuates with eye opening. There is beta activity observing the frontal areas. Awake and sleep activities are observed. K complexes and sleep spindles are noted. Photic simulation and hypoventilation are not conducted. There is no focal or lateral slowing. There a few episodes of sharp wave activity that phase reverses at T3.   IMPRESSION: This recording of the awake and sleep states is abnormal showing a few left temporal epileptiform discharges.      [name] A. Gerilyn [name], M.D.  Diplomate, Biomedical engineerAmerican Board of Psychiatry and Neurology ( Neurology).

## 2017-06-10 NOTE — Consult Note (Signed)
Mayo A. Merlene Laughter, MD     [email or web address]          Shannon [name] is an 66 y.o. female.   ASSESSMENT/PLAN: 1. The semiology of the patient's symptoms are rather ill-defined and nonspecific. However, imaging shows evidence of another embolic stroke in the setting of previous embolic strokes. Given the severe multivessel intracranial occlusive disease seen on the CTA, I think this is the most likely etiology. Cardioembolic is still a possibility but less likely. I would continue with dual antiplatelet agents and the use of statin medication. Loop recorder however still should be placed to evaluate for underlying atrial fibrillation.   2. 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  3. 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  4. Vitamin B12 deficiency. This should be replaced we'll give her an additional dose during this hospitalization.    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  HEENT: Poor dentition  ABDOMEN: soft  EXTREMITIES: No edema   BACK: Normal  SKIN: Normal by inspection.    MENTAL STATUS: Alert and oriented - including her age and the month. Speech, language and cognition are generally intact. Judgment and insight normal.   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  MOTOR: There is no drift of the upper or lower extremities. She has 4+ weakness involving right deltoid and hip flexion bilaterally. Other muscle groups  are all normal in strength.  COORDINATION: Left finger to nose is normal, right finger to nose is normal, No rest tremor; no intention tremor; no postural tremor; no bradykinesia.  REFLEXES: Deep tendon reflexes are symmetrical and normal. Babinski reflexes chronically extensor bilaterally.  SENSATION: Normal to light touch, temperature, and pinprick. She does not extinguish to double simultaneous stimulation.     The MRI is reviewed in person. There is a tiny cortical infarcts seen on DWI involving the left parietal lobe. There is a questionable area of more extensive involving the white matter left frontal lobe also on DWI with a tiny area of reduced signal seen on ADC. There is a area of tiny microhemorrhages involving the tibia aspect of the right temporal lobe. There is encephalomalacia and surrounding white matter signal involving the right occipital area extended to the medial left temporal lobe.   Blood pressure (!) 141/91, pulse 91, temperature 99.4 F (37.4 C), temperature source Oral, resp. rate 18, height '5\' 4"'  (1.626 m), weight 141 lb 5 oz (64.1 kg), SpO2 96 %.  Past Medical History:  Diagnosis Date  . Anxiety   . Arthritis   . Coronary artery disease    a. s/p PCI with DES to LAD in 2011  ([place] 03/2010: Mid LAD 70%, normal ejection fraction  >> PCI:  2.75 x 15 mm Promus DES to LAD); b. Nuclear Stress Test 05/29/14: No ischemia, EF 59%, Normal; c. NSTEMI 11/2014 >> patient declined LHC b/c of recent CVA  . Depression   . Diabetes mellitus without complication ([place])   . Dyslipidemia 11/20/2014  . Fibromyalgia 1995  . H/O ischemic right PCA stroke  10/2014   a. Carotid US 10/24/14: No significant ICA stenosis bilaterally  . Hemiparesis and alteration of sensations as late effects of stroke ([place]) 06/05/2015  . Hx of echocardiogram    a. Echocardiogram 10/25/14: Moderate LVH, EF 50-55%, normal wall motion, grade 2 diastolic dysfunction, mild LAE, atrial septal lipomatous hypertrophy    . Hypertension   . Irritable bowel     Past Surgical History:  Procedure Laterality Date  . CHOLECYSTECTOMY    . CORONARY STENT PLACEMENT    . orif arm fracture Right    forearm  . TEE WITHOUT CARDIOVERSION N/A 04/12/2017   Procedure: TRANSESOPHAGEAL ECHOCARDIOGRAM (TEE) WITH PROPOFOL;  Surgeon: Satira Sark, MD;  Location: [facility];  Service: Cardiovascular;  Laterality: N/A;    Family History  Problem Relation Age of Onset  . Cancer Mother   . Diabetes Mother   . Hypertension Mother   . Heart failure Mother   . CAD Mother   . Heart attack Mother   . Cancer Father   . Diabetes Father   . Hypertension Father   . Heart failure Father   . CAD Father   . Heart attack Father   . Stroke Father   . CAD Brother   . Diabetes Brother   . Hypertension Brother   . CAD Sister   . Diabetes Sister   . Hypertension Sister   . Heart attack Sister   . Heart attack Brother   . Stroke Maternal Aunt     Social History:  reports that she has never smoked. She has never used smokeless tobacco. She reports that she does not drink alcohol or use drugs.  Allergies:  Allergies  Allergen Reactions  . Insulins Other (See Comments)    Causes severe diarrhea  . Sulfa Antibiotics Other (See Comments)    Childhood allergy, can't remember  . Lisinopril Cough    Medications: Prior to Admission medications   Medication Sig Start Date End Date Taking? Authorizing Provider  amLODipine (NORVASC) 10 MG tablet Take 10 mg by mouth daily. 09/11/16  Yes [provider]  Ascorbic Acid (VITAMIN C) 100 MG tablet Take 100 mg by mouth daily.   Yes [provider]  aspirin EC 81 MG tablet Take 1 tablet (81 mg total) by mouth daily. 11/27/14  Yes Weaver, Scott T, PA-C  atorvastatin (LIPITOR) 80 MG tablet Take 1 tablet (80 mg total) by mouth daily. 05/03/17  Yes Dennie Bible, NP  b complex vitamins tablet Take 1 tablet by mouth daily.   Yes [provider]   Cholecalciferol (VITAMIN D-3) 1000 UNITS CAPS Take 1 capsule by mouth daily.   Yes [provider]  Chromium 200 MCG CAPS Take 1 capsule by mouth daily.   Yes [provider]  clopidogrel (PLAVIX) 75 MG tablet TAKE 1 TABLET (75 MG TOTAL) BY MOUTH DAILY WITH BREAKFAST. 02/19/17  Yes Jerline Pain, MD  co-enzyme Q-10 30 MG capsule Take 30 mg by mouth daily.   Yes [provider]  FENUGREEK PO Take 1 capsule by mouth daily.   Yes [provider]  gabapentin (NEURONTIN) 400 MG capsule Take 400 mg by mouth 2 (two) times daily. 07/29/16  Yes [provider]  GARLIC PO Take 1 capsule by mouth daily.   Yes [provider]  glimepiride (AMARYL) 4 MG tablet Take 4 mg by mouth 2 (two) times daily. 09/03/16  Yes [provider]  hydrOXYzine (ATARAX/VISTARIL) 25 MG tablet Take 25  mg by mouth 3 (three) times daily as needed for anxiety.    Yes [provider]  ibuprofen (ADVIL,MOTRIN) 200 MG tablet Take 200-400 mg by mouth every 6 (six) hours as needed.   Yes [provider]  isosorbide mononitrate (IMDUR) 30 MG 24 hr tablet Take 1 tablet (30 mg total) by mouth daily. DUE APPT IN AUGUST, MAKE APPT FOR FURTHER REFILLS, CALL [phone] TO SCHEDULE 04/16/17  Yes Skains, Thana Farr, MD  JANUVIA 100 MG tablet Take 100 mg by mouth daily. 09/11/16  Yes [provider]  Lancets (FREESTYLE) lancets Check blood sugar four times per day. Before each meal and at bedtime 07/19/14  Yes Chari Manning A, NP  levothyroxine (SYNTHROID, LEVOTHROID) 25 MCG tablet TAKE 1 TABLET (25 MCG TOTAL) BY MOUTH DAILY BEFORE BREAKFAST. 07/30/15  Yes Jerline Pain, MD  losartan (COZAAR) 100 MG tablet Take 100 mg by mouth daily. 07/29/16  Yes [provider]  metoprolol tartrate (LOPRESSOR) 25 MG tablet TAKE 1 TABLET (25 MG TOTAL) BY MOUTH 2 (TWO) TIMES DAILY. 02/20/16  Yes Jerline Pain, MD  Multiple Vitamin (MULTIVITAMIN) capsule Take 1 capsule by mouth  daily. Senior vitamin   Yes [provider]  nitroGLYCERIN (NITROSTAT) 0.4 MG SL tablet Place 1 tablet (0.4 mg total) under the tongue every 5 (five) minutes as needed. 11/27/14  Yes Weaver, Scott T, PA-C  omega-3 acid ethyl esters (LOVAZA) 1 g capsule Take 1 g by mouth daily.   Yes [provider]  temazepam (RESTORIL) 30 MG capsule Take 1 capsule by mouth daily. 05/27/17  Yes [provider]  vitamin E (VITAMIN E) 400 UNIT capsule Take 400 Units by mouth every morning.   Yes [provider]    Scheduled Meds: . aspirin EC  81 mg Oral Daily  . atorvastatin  80 mg Oral Daily  . clopidogrel  75 mg Oral Daily  . cyanocobalamin  1,000 mcg Intramuscular Once  . enoxaparin (LOVENOX) injection  40 mg Subcutaneous Q24H  . feeding supplement (GLUCERNA SHAKE)  237 mL Oral BID BM  . gabapentin  400 mg Oral BID  . insulin aspart  0-9 Units Subcutaneous TID WC  . isosorbide mononitrate  30 mg Oral Daily  . levothyroxine  25 mcg Oral QAC breakfast  . temazepam  30 mg Oral QHS   Continuous Infusions: . sodium chloride 75 mL/hr at 06/09/17 1400   PRN Meds:.acetaminophen **OR** acetaminophen, hydrOXYzine, LORazepam, ondansetron **OR** ondansetron (ZOFRAN) IV     Results for orders placed or performed during the hospital encounter of 06/08/17 (from the past 48 hour(s))  Basic metabolic panel     Status: Abnormal   Collection Time: 06/08/17 11:03 PM  Result Value Ref Range   Sodium 140 135 - 145 mmol/L   Potassium 3.8 3.5 - 5.1 mmol/L   Chloride 106 101 - 111 mmol/L   CO2 25 22 - 32 mmol/L   Glucose, Bld 126 (H) 65 - 99 mg/dL   BUN 16 6 - 20 mg/dL   Creatinine, Ser 1.00 0.44 - 1.00 mg/dL   Calcium 9.4 8.9 - 10.3 mg/dL   GFR calc non Af Amer 57 (L) >60 mL/min   GFR calc Af Amer >60 >60 mL/min    Comment: (NOTE) The eGFR has been calculated using the CKD EPI equation. This calculation has not been validated in all clinical situations. eGFR's persistently <60  mL/min signify possible Chronic Kidney Disease.    Anion gap 9 5 - 15  CBC     Status: None   Collection Time: 06/08/17 11:03 PM  Result Value Ref Range   WBC 9.4 4.0 - 10.5 K/uL   RBC 5.00 3.87 - 5.11 MIL/uL   Hemoglobin 13.9 12.0 - 15.0 g/dL   HCT 41.0 36.0 - 46.0 %   MCV 82.0 78.0 - 100.0 fL   MCH 27.8 26.0 - 34.0 pg   MCHC 33.9 30.0 - 36.0 g/dL   RDW 12.9 11.5 - 15.5 %   Platelets 197 150 - 400 K/uL  I-stat troponin, ED     Status: None   Collection Time: 06/08/17 11:23 PM  Result Value Ref Range   Troponin i, poc 0.01 0.00 - 0.08 ng/mL   Comment 3            Comment: Due to the release kinetics of cTnI, a negative result within the first hours of the onset of symptoms does not rule out myocardial infarction with certainty. If myocardial infarction is still suspected, repeat the test at appropriate intervals.   I-stat troponin, ED     Status: None   Collection Time: 06/09/17  2:00 AM  Result Value Ref Range   Troponin i, poc 0.00 0.00 - 0.08 ng/mL   Comment 3            Comment: Due to the release kinetics of cTnI, a negative result within the first hours of the onset of symptoms does not rule out myocardial infarction with certainty. If myocardial infarction is still suspected, repeat the test at appropriate intervals.   CBC     Status: None   Collection Time: 06/09/17  6:06 AM  Result Value Ref Range   WBC 9.6 4.0 - 10.5 K/uL   RBC 5.11 3.87 - 5.11 MIL/uL   Hemoglobin 14.1 12.0 - 15.0 g/dL   HCT 41.9 36.0 - 46.0 %   MCV 82.0 78.0 - 100.0 fL   MCH 27.6 26.0 - 34.0 pg   MCHC 33.7 30.0 - 36.0 g/dL   RDW 12.9 11.5 - 15.5 %   Platelets 204 150 - 400 K/uL  Comprehensive metabolic panel     Status: Abnormal   Collection Time: 06/09/17  6:06 AM  Result Value Ref Range   Sodium 139 135 - 145 mmol/L   Potassium 3.2 (L) 3.5 - 5.1 mmol/L   Chloride 104 101 - 111 mmol/L   CO2 24 22 - 32 mmol/L   Glucose, Bld 129 (H) 65 - 99 mg/dL   BUN 13 6 - 20 mg/dL    Creatinine, Ser 0.88 0.44 - 1.00 mg/dL   Calcium 9.0 8.9 - 10.3 mg/dL   Total Protein 6.5 6.5 - 8.1 g/dL   Albumin 3.7 3.5 - 5.0 g/dL   AST 21 15 - 41 U/L   ALT 20 14 - 54 U/L   Alkaline Phosphatase 59 38 - 126 U/L   Total Bilirubin 0.7 0.3 - 1.2 mg/dL   GFR calc non Af Amer >60 >60 mL/min   GFR calc Af Amer >60 >60 mL/min    Comment: (NOTE) The eGFR has been calculated using the CKD EPI equation. This calculation has not been validated in all clinical situations. eGFR's persistently <60 mL/min signify possible Chronic Kidney Disease.    Anion gap 11 5 - 15  Troponin I (q 6hr x 3)     Status: None   Collection Time: 06/09/17  6:06 AM  Result Value Ref Range   Troponin I <0.03 <0.03 ng/mL  Hemoglobin A1c     Status: Abnormal   Collection Time: 06/09/17  6:09 AM  Result Value Ref Range   Hgb A1c MFr Bld 6.8 (H) 4.8 - 5.6 %    Comment: (NOTE)         Pre-diabetes: 5.7 - 6.4         Diabetes: >6.4         Glycemic control for adults with diabetes: <7.0    Mean Plasma Glucose 148 mg/dL    Comment: (NOTE) Performed At: [facility] [address] [place], [place] [id number] Lindon Romp MD [id number]   Glucose, capillary     Status: Abnormal   Collection Time: 06/09/17  6:36 AM  Result Value Ref Range   Glucose-Capillary 131 (H) 65 - 99 mg/dL   Comment 1 Notify RN    Comment 2 Document in Chart   Glucose, capillary     Status: Abnormal   Collection Time: 06/09/17 11:11 AM  Result Value Ref Range   Glucose-Capillary 308 (H) 65 - 99 mg/dL   Comment 1 Notify RN    Comment 2 Document in Chart   Troponin I (q 6hr x 3)     Status: None   Collection Time: 06/09/17 12:13 PM  Result Value Ref Range   Troponin I <0.03 <0.03 ng/mL  Glucose, capillary     Status: Abnormal   Collection Time: 06/09/17  4:16 PM  Result Value Ref Range   Glucose-Capillary 119 (H) 65 - 99 mg/dL   Comment 1 Notify RN    Comment 2 Document in Chart   Troponin I (q 6hr x 3)     Status:  None   Collection Time: 06/09/17  5:10 PM  Result Value Ref Range   Troponin I <0.03 <0.03 ng/mL  Vitamin B12     Status: Abnormal   Collection Time: 06/09/17  7:00 PM  Result Value Ref Range   Vitamin B-12 145 (L) 180 - 914 pg/mL    Comment: (NOTE) This assay is not validated for testing neonatal or myeloproliferative syndrome specimens for Vitamin B12 levels. Performed at [facility], [place] [address] [place], [place] [address]   RPR     Status: None   Collection Time: 06/09/17  7:00 PM  Result Value Ref Range   RPR Ser Ql Non Reactive Non Reactive    Comment: (NOTE) Performed At: [facility] [address] [place], [place] [id number] Lindon Romp MD [id number]   TSH     Status: None   Collection Time: 06/09/17  7:00 PM  Result Value Ref Range   TSH 2.303 0.350 - 4.500 uIU/mL    Comment: Performed by a 3rd Generation assay with a functional sensitivity of <=0.01 uIU/mL.  ANA, IFA (with reflex)     Status: None   Collection Time: 06/09/17  7:00 PM  Result Value Ref Range   ANA Ab, IFA Negative     Comment: (NOTE)                                     Negative   <1:80                                     Borderline  1:80  Positive   >1:80 Performed At: [facility] [place], [place] [id number] Lindon Romp MD [id number]   C-reactive protein     Status: Abnormal   Collection Time: 06/09/17  7:00 PM  Result Value Ref Range   CRP 1.1 (H) <1.0 mg/dL    Comment: Performed at [facility], [place] [address], [place],  [address]  Sedimentation rate     Status: None   Collection Time: 06/09/17  7:00 PM  Result Value Ref Range   Sed Rate 3 0 - 22 mm/hr  Glucose, capillary     Status: Abnormal   Collection Time: 06/10/17 12:10 AM  Result Value Ref Range   Glucose-Capillary 171 (H) 65 - 99 mg/dL   Comment 1 Notify RN    Comment 2 Document in Chart   Glucose, capillary     Status:  Abnormal   Collection Time: 06/10/17  6:13 AM  Result Value Ref Range   Glucose-Capillary 170 (H) 65 - 99 mg/dL   Comment 1 Notify RN    Comment 2 Document in Chart   Glucose, capillary     Status: Abnormal   Collection Time: 06/10/17 11:44 AM  Result Value Ref Range   Glucose-Capillary 151 (H) 65 - 99 mg/dL   Comment 1 Notify RN    Comment 2 Document in Chart   Glucose, capillary     Status: Abnormal   Collection Time: 06/10/17  4:56 PM  Result Value Ref Range   Glucose-Capillary 164 (H) 65 - 99 mg/dL   Comment 1 Notify RN    Comment 2 Document in Chart     Studies/Results:    BRAIN MRI 04-2017 Brain: Multiple (approximately 20) foci of acute infarction in the left hemisphere within the middle cerebral artery territory, the largest being a 1.5 cm region visible on CT in the left frontoparietal white matter. Findings likely represent micro embolic infarctions. No other vascular territory acute finding. Brainstem and cerebellum are normal. Old infarction in the right PCA territory as seen previously. No mass lesion, hemorrhage, hydrocephalus or extra-axial collection.   Vascular: Major vessels at the base of the brain show flow.   Skull and upper cervical spine: Negative   Sinuses/Orbits: Clear/normal   Other: None   IMPRESSION: Multiple foci of acute infarction in the left MCA territory consistent with micro embolic infarctions. No significant swelling, mass effect or hemorrhage.   Old infarction in the right PCA territory.   CAROTID DOPPLERS IMPRESSION: 1. Minimal amount of right-sided atherosclerotic plaque results in elevated peak systolic velocities within the right internal carotid artery compatible with the lower end of the 50-69% luminal narrowing range. Further evaluation with CTA could performed as clinically indicated. 2. Large amount of left-sided atherosclerotic plaque, morphologically results in at least 50% luminal narrowing though does not  definitely result in elevated peak systolic velocities within the left internal carotid artery to suggest a hemodynamically significant narrowing. Again, this discrepancy could be further evaluated with CTA as indicated.   TEE - Left ventricle: Systolic function was normal. The estimated   ejection fraction was in the range of 55% to 60%. Wall motion was   normal; there were no regional wall motion abnormalities. - Descending aorta: Mild, scattered atherosclerosis noted. - Mitral valve: There was trivial regurgitation. - Left atrium: The atrium was dilated. No evidence of thrombus in   the atrial cavity or appendage. Emptying velocity was normal. - Right atrium: No evidence of thrombus in the atrial  cavity or   appendage. - Atrial septum: No defect or patent foramen ovale was identified.   No evidence of interatrial shunt by color flow Doppler imaging or   agitated saline injection. - Tricuspid valve: There was trivial regurgitation.   Impressions:   - LVEF 55-60% without wall motion abnormalities. No evidence of   left or right atrial appendage thrombus. No ASD or PFO based on   color flow Doppler and agitated saline injection. Mild scattered   aortic atherosclerosis noted.     BRAIN MRI/MRA 2015 Acute infarction in the right PCA territory affecting the posterior medial temporal lobe, occipital lobe, hippocampus and to a minimal extent the right thalamus. No mass effect or hemorrhage. MR angiography shows occlusion or markedly diminished flow in the right posterior cerebral artery. Additionally, there is narrowing and irregularity in the proximal anterior and middle cerebral arteries, placing the patient at risk of anterior circulation infarction in the future.  Despite this extensive vascular disease, this acute right posterior cerebral artery territory infarction appears to be the patient's only previous cerebrovascular event.       HEAD CTA FINDINGS: CT  HEAD  Brain: Chronic encephalomalacia in the right PCA territory, and to a lesser extent the left parietal lobe. No acute intracranial hemorrhage identified. No midline shift, mass effect, or evidence of intracranial mass lesion. Stable gray-white matter differentiation since the CT on 06/09/2017. Stable ventricle size and configuration.  Calvarium and skull base: No acute osseous abnormality identified.  Paranasal sinuses: Visualized paranasal sinuses and mastoids are stable and well pneumatized.  Orbits: No acute orbit or scalp soft tissue findings.  CTA HEAD  Posterior circulation: Stable distal vertebral arteries. PICA origins remain patent. Patent vertebrobasilar junction. No basilar stenosis. SCA and left PCA origins are patent. Chronic right PCA occlusion. Posterior communicating arteries are diminutive or absent. There is mild to moderate left P1 segment stenosis, stable. Left PCA branches are within normal limits.  Anterior circulation: Extensive calcified atherosclerosis of both ICA siphons which remain patent. Mild right ICA siphon stenosis at the anterior genu does not appear hemodynamically significant.  But there is moderate to severe left ICA siphon stenosis at both the cavernous segment (series 14, image 90) and supraclinoid segment (series 10, image 33).  Both carotid termini remain patent, although there is severe stenosis at both ACA origins, and progressed severe left MCA and proximal M1 segment stenosis or short segment thrombosis. There is a 4-5 mm segment of non enhancement of the proximal left MCA (series 11, image 10). Despite this the left MCA bifurcation is patent, although diminutive compared to the right side. Mild generalized left MCA branch irregularity is noted with no left MCA branch occlusion identified.  The right MCA origin is mildly irregular. The right M1 segment is patent without stenosis. The right MCA bifurcation is patent.  No right MCA branch occlusion or significant irregularity.  Severe bilateral A1 stenosis, with chronically poor flow or occlusion of the right ACA A2 segment distal to the right frontopolar branch (series 15, image 18). This right ACA abnormality has progressed since 2017. Distal left ACA branches are mildly irregular. The anterior communicating artery remains patent.  Venous sinuses: Patent.  Anatomic variants: None.  Delayed phase: No abnormal enhancement identified.  Review of the MIP images confirms the above findings  IMPRESSION: 1. Severe chronic intracranial atherosclerosis with progressed anterior circulation disease since 2017. 2. 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 Patent but decreased  flow at the Left MCA bifurcation. No left MCA branch occlusion. 3. Progressed poor flow/thrombosis of the Right ACA A2 segment since 2017. Continued severe bilateral A1 stenosis. 4. Underlying moderate to severe multifocal Left ICA siphon stenosis. 5. Chronic right PCA occlusion, and stable moderate stenosis of the left PCA P1 segment. 6. Stable CT appearance of the brain since 06/09/2017. No new intracranial abnormality identified.         Shannon [name], M.D.  Diplomate, Tax adviser of Psychiatry and Neurology ( Neurology). 06/10/2017, 6:18 PM

## 2017-06-10 NOTE — Progress Notes (Signed)
PROGRESS NOTE    Shannon [name]  ONG:[id number] DOB: 09/09/1951 DOA: 06/08/2017 PCP: Nila NephewGreen, Edwin, MD    Brief Narrative: 66 yo with hx of " 8 strokes", DM, HTN, hypothyroidism, on DUAT with compliance, admitted with dizziness, blurry Vx, and chest pain.  Her MRI showed acute CVA in the pre central gyrus with resolving area of subacute ischemia in the left centrum semiovale, along with prior old CVAs.  She also had atypical chest pain with negative troponins.  Her oral hypoglycemic agents were held, and she has agreed to take sensitve insulin coverage.  She said it gave her diarrhea, but this time, it did not. She had TEE and recent loop recorder inserted by cardiology July 2018.   US carotid showed non critical but extensive plagues.  Neurology saw her, and ordered MRA and EEG. No new neurological events.   Assessment & Plan:   Active Problems:   Acute CVA -Dec 2015   DM type 2 (diabetes mellitus, type 2) (HCC)   Weakness   Hypothyroidism   Visual disturbance   Dizziness   Malnutrition of moderate degree   1. Acute multifoci and recurrent CVA's, on DUAT.  I will ask Dr Gerilyn Pilgrimoonquah if she would benefit being on an anticoagulant and ASA.   2. DM:  WIll continue with SSI, she will be discharged on her own oral anti hyperglycemics. 3. Hypothyroidism:  Continue with supplement.   DVT prophylaxis:  Lovenox.  Code Status: DNR.  Family Communication: None.  Disposition Plan: Home.   Consultants:   Neurology.   Procedures:   None.   Antimicrobials: Anti-infectives    None       Subjective:  No complaints.  Objective: Vitals:   06/09/17 1306 06/09/17 2026 06/09/17 2053 06/10/17 0614  BP: 119/78  137/65 132/66  Pulse: 83  84 81  Resp: 16  20 20   Temp:   98.4 F (36.9 C) 98.1 F (36.7 C)  TempSrc:   Oral Oral  SpO2: 100% 96% 97% 98%  Weight:      Height:        Intake/Output Summary (Last 24 hours) at 06/10/17 1243 Last data filed at 06/10/17 0900  Gross per 24  hour  Intake              705 ml  Output                0 ml  Net              705 ml   Filed Weights   06/08/17 2245 06/09/17 0632  Weight: 66.2 kg (146 lb) 64.1 kg (141 lb 5 oz)    Examination:  General exam: Appears calm and comfortable  Respiratory system: Clear to auscultation. Respiratory effort normal. Cardiovascular system: S1 & S2 heard, RRR. No JVD, murmurs, rubs, gallops or clicks. No pedal edema. Gastrointestinal system: Abdomen is nondistended, soft and nontender. No organomegaly or masses felt. Normal bowel sounds heard. Central nervous system: Alert and oriented. No focal neurological deficits. Extremities: Symmetric 5 x 5 power. Skin: No rashes, lesions or ulcers Psychiatry: Judgement and insight appear normal. Mood & affect appropriate.   Data Reviewed: I have personally reviewed following labs and imaging studies  CBC:  Recent Labs Lab 06/08/17 2303 06/09/17 0606  WBC 9.4 9.6  HGB 13.9 14.1  HCT 41.0 41.9  MCV 82.0 82.0  PLT 197 204   Basic Metabolic Panel:  Recent Labs Lab 06/08/17 2303 06/09/17 0606  NA 140  139  K 3.8 3.2*  CL 106 104  CO2 25 24  GLUCOSE 126* 129*  BUN 16 13  CREATININE 1.00 0.88  CALCIUM 9.4 9.0   GFR: Estimated Creatinine Clearance: 52.8 mL/min (by C-G formula based on SCr of 0.88 mg/dL). Liver Function Tests:  Recent Labs Lab 06/09/17 0606  AST 21  ALT 20  ALKPHOS 59  BILITOT 0.7  PROT 6.5  ALBUMIN 3.7   Cardiac Enzymes:  Recent Labs Lab 06/09/17 0606 06/09/17 1213 06/09/17 1710  TROPONINI <0.03 <0.03 <0.03   HbA1C:  Recent Labs  06/09/17 0609  HGBA1C 6.8*   CBG:  Recent Labs Lab 06/09/17 1111 06/09/17 1616 06/10/17 0010 06/10/17 0613 06/10/17 1144  GLUCAP 308* 119* 171* 170* 151*   Thyroid Function Tests:  Recent Labs  06/09/17 1900  TSH 2.303   Anemia Panel:  Recent Labs  06/09/17 1900  VITAMINB12 145*   Sepsis Labs:  Radiology Studies: Ct Angio Head W Or Wo  Contrast  Addendum Date: 06/10/2017   ADDENDUM REPORT: 06/10/2017 08:25 ADDENDUM: Study discussed by telephone with Dr. Beryle BeamsKOFI DOONQUAH on 06/10/2017 at 0820 hours. Electronically Signed   By: Odessa FlemingH  Hall M.D.   On: 06/10/2017 08:25   Result Date: 06/10/2017 CLINICAL DATA:  66 year old female with dizziness. Left MCA territory ischemia, including a small acute infarct at the left motor strip on brain MRI yesterday. Chronic right PCA infarct and right PCA occlusion. EXAM: CT ANGIOGRAPHY HEAD TECHNIQUE: Multidetector CT imaging of the head was performed using the standard protocol during bolus administration of intravenous contrast. Multiplanar CT image reconstructions and MIPs were obtained to evaluate the vascular anatomy. CONTRAST:  75 mL Isovue 370 COMPARISON:  Brain MRI 06/09/2017, 04/10/2017. Head CT without contrast 06/09/2017. CTA head and neck 06/16/2016. FINDINGS: CT HEAD Brain: Chronic encephalomalacia in the right PCA territory, and to a lesser extent the left parietal lobe. No acute intracranial hemorrhage identified. No midline shift, mass effect, or evidence of intracranial mass lesion. Stable gray-white matter differentiation since the CT on 06/09/2017. Stable ventricle size and configuration. Calvarium and skull base: No acute osseous abnormality identified. Paranasal sinuses: Visualized paranasal sinuses and mastoids are stable and well pneumatized. Orbits: No acute orbit or scalp soft tissue findings. CTA HEAD Posterior circulation: Stable distal vertebral arteries. PICA origins remain patent. Patent vertebrobasilar junction. No basilar stenosis. SCA and left PCA origins are patent. Chronic right PCA occlusion. Posterior communicating arteries are diminutive or absent. There is mild to moderate left P1 segment stenosis, stable. Left PCA branches are within normal limits. Anterior circulation: Extensive calcified atherosclerosis of both ICA siphons which remain patent. Mild right ICA siphon stenosis at the  anterior genu does not appear hemodynamically significant. But there is moderate to severe left ICA siphon stenosis at both the cavernous segment (series 14, image 90) and supraclinoid segment (series 10, image 33). Both carotid termini remain patent, although there is severe stenosis at both ACA origins, and progressed severe left MCA and proximal M1 segment stenosis or short segment thrombosis. There is a 4-5 mm segment of non enhancement of the proximal left MCA (series 11, image 10). Despite this the left MCA bifurcation is patent, although diminutive compared to the right side. Mild generalized left MCA branch irregularity is noted with no left MCA branch occlusion identified. The right MCA origin is mildly irregular. The right M1 segment is patent without stenosis. The right MCA bifurcation is patent. No right MCA branch occlusion or significant irregularity. Severe bilateral A1 stenosis, with  chronically poor flow or occlusion of the right ACA A2 segment distal to the right frontopolar branch (series 15, image 18). This right ACA abnormality has progressed since 2017. Distal left ACA branches are mildly irregular. The anterior communicating artery remains patent. Venous sinuses: Patent. Anatomic variants: None. Delayed phase: No abnormal enhancement identified. Review of the MIP images confirms the above findings IMPRESSION: 1. Severe chronic intracranial atherosclerosis with progressed anterior circulation disease since 2017. 2. 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 Patent but decreased flow at the Left MCA bifurcation. No left MCA branch occlusion. 3. Progressed poor flow/thrombosis of the Right ACA A2 segment since 2017. Continued severe bilateral A1 stenosis. 4. Underlying moderate to severe multifocal Left ICA siphon stenosis. 5. Chronic right PCA occlusion, and stable moderate stenosis of the left PCA P1 segment. 6. Stable CT appearance of the brain since  06/09/2017. No new intracranial abnormality identified. Electronically Signed: By: Odessa Fleming M.D. On: 06/10/2017 08:19   Dg Chest 2 View  Result Date: 06/09/2017 CLINICAL DATA:  Dizziness and chest tightness for the past 2 days. Shortness of breath. EXAM: CHEST  2 VIEW COMPARISON:  04/09/2017. FINDINGS: Normal sized heart. Clear lungs with normal vascularity. Unremarkable bones. IMPRESSION: No acute abnormality. Electronically Signed   By: Beckie Salts M.D.   On: 06/09/2017 00:03   Ct Head Wo Contrast  Result Date: 06/09/2017 CLINICAL DATA:  Dizziness and blurred vision since 06/07/2017. History of stroke. EXAM: CT HEAD WITHOUT CONTRAST TECHNIQUE: Contiguous axial images were obtained from the base of the skull through the vertex without intravenous contrast. COMPARISON:  04/24/2017 FINDINGS: Brain: Patchy low-attenuation areas throughout the deep white matter consistent with small vessel ischemia. Old infarcts demonstrated in the right occipital lobe, left posterior parietal lobe, and left cerebellum. No changes since previous study. No mass effect or midline shift. No abnormal extra-axial fluid collections. Gray-white matter junctions are distinct. Basal cisterns are not effaced. No ventricular dilatation. No evidence of acute intracranial hemorrhage. Vascular: Vascular calcifications are present. Skull: No depressed skull fractures. Sinuses/Orbits: No acute finding. Other: None. IMPRESSION: No acute intracranial abnormalities. Chronic small vessel ischemic changes. Old infarcts in the right occipital, left posterior parietal, and left cerebellar regions. Electronically Signed   By: Burman Nieves M.D.   On: 06/09/2017 01:25   Mr Brain Wo Contrast  Result Date: 06/09/2017 CLINICAL DATA:  Dizziness and blurred vision since 06/07/2017. History of stroke. EXAM: MRI HEAD WITHOUT CONTRAST TECHNIQUE: Multiplanar, multiecho pulse sequences of the brain and surrounding structures were obtained without intravenous  contrast. COMPARISON:  MRI brain 04/10/2017.  CT head earlier today. FINDINGS: Brain: Changes of subacute infarction, watershed distribution, LEFT centrum semiovale were acute on the previous most recent MR on June 2nd 2018. There is a new area of restricted diffusion, 5 mm in diameter, representing acute infarction in the LEFT posterior frontal cortex, precentral gyrus, image 97 series 2, consistent with acute infarction. This is nonhemorrhagic. Cerebral atrophy. Chronic RIGHT PCA infarct with hemosiderin. Chronic microvascular ischemic change of a mild-to-moderate nature. Vascular: Flow voids are maintained. Skull and upper cervical spine: Normal marrow signal. Sinuses/Orbits: No layering fluid. Significant nasal septal deviation LEFT-to-RIGHT. No orbital findings. Other: None. IMPRESSION: There is a new subcentimeter area of acute LEFT hemisphere infarction, precentral gyrus, nonhemorrhagic. Resolving areas of subacute ischemia in the LEFT centrum semiovale. Chronic RIGHT PCA insult. Electronically Signed   By: Elsie Stain M.D.   On: 06/09/2017 08:51   US  Carotid Bilateral  Result Date: 06/09/2017 CLINICAL DATA:  Vertigo. Blurred vision. Left-sided numbness and weakness. History of hypertension, hyperlipidemia, diabetes and CAD (post coronary stent placement). EXAM: BILATERAL CAROTID DUPLEX ULTRASOUND TECHNIQUE: Wallace Cullens scale imaging, color Doppler and duplex ultrasound were performed of bilateral carotid and vertebral arteries in the neck. COMPARISON:  None. FINDINGS: Criteria: Quantification of carotid stenosis is based on velocity parameters that correlate the residual internal carotid diameter with NASCET-based stenosis levels, using the diameter of the distal internal carotid lumen as the denominator for stenosis measurement. The following velocity measurements were obtained: RIGHT ICA:  127/44 cm/sec CCA:  92/21 cm/sec SYSTOLIC ICA/CCA RATIO:  1.4 DIASTOLIC ICA/CCA RATIO:  2.1 ECA:  127 cm/sec LEFT ICA:   30/9 cm/sec CCA:  79/5 cm/sec SYSTOLIC ICA/CCA RATIO:  0.4 DIASTOLIC ICA/CCA RATIO:  1.7 ECA:  86 cm/sec RIGHT CAROTID ARTERY: There is a minimal amount of intimal thickening within the distal aspect the right common carotid artery (image 13), extending to involve the right carotid bulb (image 18). There is a minimal amount of eccentric mixed echogenic plaque involving the origin and proximal aspect of the right internal carotid artery (image 26), which results in borderline elevated peak systolic velocities within the proximal aspect the right internal carotid artery (greatest acquired peak systolic velocity with the proximal ICA measures 127 cm/sec - image 28). RIGHT VERTEBRAL ARTERY:  Antegrade Flow LEFT CAROTID ARTERY: There is a minimal amount of eccentric hypoechoic plaque within the mid and distal aspects of the left common carotid artery (images 43 and 47). There is a large amount of eccentric mixed echogenic plaque within the left carotid bulb (images 50 and 52), morphologically resulting in at least 50% luminal narrowing though not definitely resulting elevated peak systolic velocities within the left internal carotid artery. LEFT VERTEBRAL ARTERY:  Antegrade flow IMPRESSION: 1. Minimal amount of right-sided atherosclerotic plaque results in elevated peak systolic velocities within the right internal carotid artery compatible with the lower end of the 50-69% luminal narrowing range. Further evaluation with CTA could performed as clinically indicated. 2. Large amount of left-sided atherosclerotic plaque, morphologically results in at least 50% luminal narrowing though does not definitely result in elevated peak systolic velocities within the left internal carotid artery to suggest a hemodynamically significant narrowing. Again, this discrepancy could be further evaluated with CTA as indicated. Electronically Signed   By: Simonne Come M.D.   On: 06/09/2017 15:17    Scheduled Meds: . aspirin EC  81 mg Oral  Daily  . atorvastatin  80 mg Oral Daily  . clopidogrel  75 mg Oral Daily  . enoxaparin (LOVENOX) injection  40 mg Subcutaneous Q24H  . feeding supplement (GLUCERNA SHAKE)  237 mL Oral BID BM  . gabapentin  400 mg Oral BID  . insulin aspart  0-9 Units Subcutaneous TID WC  . isosorbide mononitrate  30 mg Oral Daily  . levothyroxine  25 mcg Oral QAC breakfast  . temazepam  30 mg Oral QHS   Continuous Infusions: . sodium chloride 75 mL/hr at 06/09/17 1400     LOS: 1 day   Kendell Gammon, MD FACP Hospitalist.   If 7PM-7AM, please contact night-coverage [email or web address] Password [facility] 06/10/2017, 12:43 PM

## 2017-06-10 NOTE — Progress Notes (Signed)
EEG completed; results pending.    

## 2017-06-11 DIAGNOSIS — R079 Chest pain, unspecified: Secondary | ICD-10-CM

## 2017-06-11 LAB — GLUCOSE, CAPILLARY
GLUCOSE-CAPILLARY: 143 mg/dL — AB (ref 65–99)
GLUCOSE-CAPILLARY: 110 mg/dL — AB (ref 65–99)
GLUCOSE-CAPILLARY: 145 mg/dL — AB (ref 65–99)
GLUCOSE-CAPILLARY: 155 mg/dL — AB (ref 65–99)
Glucose-Capillary: 112 mg/dL — ABNORMAL HIGH (ref 65–99)

## 2017-06-11 LAB — HOMOCYSTEINE: HOMOCYSTEINE-NORM: 12.6 umol/L (ref 0.0–15.0)

## 2017-06-11 MED ORDER — STROKE: EARLY STAGES OF RECOVERY BOOK
Freq: Once | Status: AC
  Administered 2017-06-11: 1
  Filled 2017-06-11: qty 1

## 2017-06-11 MED ORDER — INSULIN ASPART 100 UNIT/ML ~~LOC~~ SOLN
0.0000 [IU] | Freq: Four times a day (QID) | SUBCUTANEOUS | Status: DC
  Administered 2017-06-11 (×2): 1 [IU] via SUBCUTANEOUS
  Administered 2017-06-12 (×2): 2 [IU] via SUBCUTANEOUS

## 2017-06-11 MED ORDER — LORATADINE 10 MG PO TABS
10.0000 mg | ORAL_TABLET | Freq: Every day | ORAL | Status: DC | PRN
  Administered 2017-06-11 – 2017-06-12 (×2): 10 mg via ORAL
  Filled 2017-06-11 (×3): qty 1

## 2017-06-11 NOTE — Care Management Important Message (Signed)
Important Message  Patient Details  Name: Shannon [name] MRN: [id number] Date of Birth: 03/29/1951   Medicare Important Message Given:  Yes    Malcolm MetroChildress, Sully Dyment Demske, RN 06/11/2017, 2:13 PM

## 2017-06-11 NOTE — Progress Notes (Signed)
SLP Cancellation Note  Patient Details Name: Shannon [name] MRN: [id number] DOB: 09/25/1951   Cancelled treatment:         SLP re-screened for cognitive/speech deficits. Pt reports she has experienced difficulty finding her words/her words being jumbled in the past but for some reason it is "better" today. She presented with no noted/overt speech language deficits this date; she reports it "comes and goes". ST is following for dysphagia and will continue to screen Pt for speech/language needs and will evaluate as indicated. SLP will provide dysphagia therapy later this date as schedule permits.   Adriel Kessen H. Romie LeveeYarbrough MA, CCC-SLP Speech Language Pathologist    Shannon [name] 06/11/2017, 3:15 PM

## 2017-06-11 NOTE — Care Management Note (Signed)
Case Management Note  Patient Details  Name: Shannon [name] MRN: [id number] Date of Birth: 03/14/1951   Expected Discharge Date:       06/12/2017           Expected Discharge Plan:  Home w Home Health Services  In-House Referral:  NA  Discharge planning Services  CM Consult  Post Acute Care Choice:  Home Health, Resumption of Svcs/PTA Provider Choice offered to:  Patient  [facility]  [facility]  Status of Service:  Completed, signed off   Additional Comments: [facility] aware of DC planned for tomorrow. [facility]. SLP will be added as it was recommended. Pt has no DME needs at DC.   Malcolm Metrohildress, Joycie Aerts Demske, RN 06/11/2017, 2:17 PM

## 2017-06-11 NOTE — Progress Notes (Signed)
PROGRESS NOTE    Shannon [name]  ZOX:[id number] DOB: 14-Sep-1951 DOA: 06/08/2017 PCP: Nila Nephew, MD    Brief Narrative:  67 yo with hx of " 8 strokes", DM, HTN, hypothyroidism, on DUAT with compliance, admitted with dizziness, blurry Vx, and chest pain. Her MRI showed acute CVA in the pre central gyrus with resolving area of subacute ischemia in the left centrum semiovale, along with prior old CVAs. She also had atypical chest pain with negative troponins.  Her oral hypoglycemic agents were held, and she has agreed to take sensitve insulin coverage. She said it gave her diarrhea, but this time, it did not. She had TEE and recent loop recorder inserted by cardiology July 2018. US carotid showed non critical but extensive plagues.  Neurology saw her, and ordered MRA and EEG. No new neurological events. EEG showed seizure activities and neurology started her on low dose Keppra.     Assessment & Plan:   Active Problems:   Acute CVA -Dec 2015   DM type 2 (diabetes mellitus, type 2) (HCC)   Weakness   Hypothyroidism   Visual disturbance   Dizziness   Malnutrition of moderate degree  1. Acute multifoci and recurrent CVA's, on DUAT.  Neurology did not recommend full anticoagulation.  We will continue with DUAT, and statin, with alpha omega.  Will have her follow up with Dr Gerilyn Pilgrim upon discharge.  Plan for discharge her home tomorrow. 2. DM:  WIll continue with SSI, she will be discharged on her own oral anti hyperglycemics. 3. Hypothyroidism:  Continue with supplement.   DVT prophylaxis: Lovenox.  Code Status: DNR.  Family Communication: None.  Disposition Plan: Home.   Consultants:   Neurology.   Procedures:   None.  Antimicrobials: Anti-infectives    None       Subjective: " my daughter has epilepsy"   Objective: Vitals:   06/10/17 1808 06/10/17 2102 06/11/17 0515 06/11/17 0800  BP:  138/74 (!) 149/76 (!) 147/83  Pulse:  89 84 81  Resp:  18 18 17   Temp:   99.1 F (37.3 C) (!) 97.5 F (36.4 C) 98.4 F (36.9 C)  TempSrc:  Oral Oral Oral  SpO2: 96% 98% 97% 98%  Weight:      Height:        Intake/Output Summary (Last 24 hours) at 06/11/17 1203 Last data filed at 06/11/17 0854  Gross per 24 hour  Intake              600 ml  Output                0 ml  Net              600 ml   Filed Weights   06/08/17 2245 06/09/17 0632  Weight: 66.2 kg (146 lb) 64.1 kg (141 lb 5 oz)    Examination:  General exam: Appears calm and comfortable  Respiratory system: Clear to auscultation. Respiratory effort normal. Cardiovascular system: S1 & S2 heard, RRR. No JVD, murmurs, rubs, gallops or clicks. No pedal edema. Gastrointestinal system: Abdomen is nondistended, soft and nontender. No organomegaly or masses felt. Normal bowel sounds heard. Central nervous system: Alert and oriented. No focal neurological deficits. Extremities: Symmetric 5 x 5 power. Skin: No rashes, lesions or ulcers Psychiatry: Judgement and insight appear normal. Mood & affect appropriate.   Data Reviewed: I have personally reviewed following labs and imaging studies  CBC:  Recent Labs Lab 06/08/17 2303 06/09/17 0606  WBC  9.4 9.6  HGB 13.9 14.1  HCT 41.0 41.9  MCV 82.0 82.0  PLT 197 204   Basic Metabolic Panel:  Recent Labs Lab 06/08/17 2303 06/09/17 0606  NA 140 139  K 3.8 3.2*  CL 106 104  CO2 25 24  GLUCOSE 126* 129*  BUN 16 13  CREATININE 1.00 0.88  CALCIUM 9.4 9.0   GFR: Estimated Creatinine Clearance: 52.8 mL/min (by C-G formula based on SCr of 0.88 mg/dL). Liver Function Tests:  Recent Labs Lab 06/09/17 0606  AST 21  ALT 20  ALKPHOS 59  BILITOT 0.7  PROT 6.5  ALBUMIN 3.7   Cardiac Enzymes:  Recent Labs Lab 06/09/17 0606 06/09/17 1213 06/09/17 1710  TROPONINI <0.03 <0.03 <0.03   HbA1C:  Recent Labs  06/09/17 0609  HGBA1C 6.8*   CBG:  Recent Labs Lab 06/10/17 1656 06/11/17 0124 06/11/17 0647 06/11/17 0801 06/11/17 1119   GLUCAP 164* 143* 110* 112* 145*   Thyroid Function Tests:  Recent Labs  06/09/17 1900  TSH 2.303   Anemia Panel:  Recent Labs  06/09/17 1900  VITAMINB12 145*   Sepsis Labs:   Radiology Studies: Ct Angio Head W Or Wo Contrast  Addendum Date: 06/10/2017   ADDENDUM REPORT: 06/10/2017 08:25 ADDENDUM: Study discussed by telephone with Dr. Beryle Beams on 06/10/2017 at 0820 hours. Electronically Signed   By: Odessa Fleming M.D.   On: 06/10/2017 08:25   Result Date: 06/10/2017 CLINICAL DATA:  66 year old female with dizziness. Left MCA territory ischemia, including a small acute infarct at the left motor strip on brain MRI yesterday. Chronic right PCA infarct and right PCA occlusion. EXAM: CT ANGIOGRAPHY HEAD TECHNIQUE: Multidetector CT imaging of the head was performed using the standard protocol during bolus administration of intravenous contrast. Multiplanar CT image reconstructions and MIPs were obtained to evaluate the vascular anatomy. CONTRAST:  75 mL Isovue 370 COMPARISON:  Brain MRI 06/09/2017, 04/10/2017. Head CT without contrast 06/09/2017. CTA head and neck 06/16/2016. FINDINGS: CT HEAD Brain: Chronic encephalomalacia in the right PCA territory, and to a lesser extent the left parietal lobe. No acute intracranial hemorrhage identified. No midline shift, mass effect, or evidence of intracranial mass lesion. Stable gray-white matter differentiation since the CT on 06/09/2017. Stable ventricle size and configuration. Calvarium and skull base: No acute osseous abnormality identified. Paranasal sinuses: Visualized paranasal sinuses and mastoids are stable and well pneumatized. Orbits: No acute orbit or scalp soft tissue findings. CTA HEAD Posterior circulation: Stable distal vertebral arteries. PICA origins remain patent. Patent vertebrobasilar junction. No basilar stenosis. SCA and left PCA origins are patent. Chronic right PCA occlusion. Posterior communicating arteries are diminutive or absent.  There is mild to moderate left P1 segment stenosis, stable. Left PCA branches are within normal limits. Anterior circulation: Extensive calcified atherosclerosis of both ICA siphons which remain patent. Mild right ICA siphon stenosis at the anterior genu does not appear hemodynamically significant. But there is moderate to severe left ICA siphon stenosis at both the cavernous segment (series 14, image 90) and supraclinoid segment (series 10, image 33). Both carotid termini remain patent, although there is severe stenosis at both ACA origins, and progressed severe left MCA and proximal M1 segment stenosis or short segment thrombosis. There is a 4-5 mm segment of non enhancement of the proximal left MCA (series 11, image 10). Despite this the left MCA bifurcation is patent, although diminutive compared to the right side. Mild generalized left MCA branch irregularity is noted with no left MCA branch occlusion  identified. The right MCA origin is mildly irregular. The right M1 segment is patent without stenosis. The right MCA bifurcation is patent. No right MCA branch occlusion or significant irregularity. Severe bilateral A1 stenosis, with chronically poor flow or occlusion of the right ACA A2 segment distal to the right frontopolar branch (series 15, image 18). This right ACA abnormality has progressed since 2017. Distal left ACA branches are mildly irregular. The anterior communicating artery remains patent. Venous sinuses: Patent. Anatomic variants: None. Delayed phase: No abnormal enhancement identified. Review of the MIP images confirms the above findings IMPRESSION: 1. Severe chronic intracranial atherosclerosis with progressed anterior circulation disease since 2017. 2. 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 Patent but decreased flow at the Left MCA bifurcation. No left MCA branch occlusion. 3. Progressed poor flow/thrombosis of the Right ACA A2 segment since 2017.  Continued severe bilateral A1 stenosis. 4. Underlying moderate to severe multifocal Left ICA siphon stenosis. 5. Chronic right PCA occlusion, and stable moderate stenosis of the left PCA P1 segment. 6. Stable CT appearance of the brain since 06/09/2017. No new intracranial abnormality identified. Electronically Signed: By: Odessa FlemingH  Hall M.D. On: 06/10/2017 08:19   [place] Carotid Bilateral  Result Date: 06/09/2017 CLINICAL DATA:  Vertigo. Blurred vision. Left-sided numbness and weakness. History of hypertension, hyperlipidemia, diabetes and CAD (post coronary stent placement). EXAM: BILATERAL CAROTID DUPLEX ULTRASOUND TECHNIQUE: Wallace CullensGray scale imaging, color Doppler and duplex ultrasound were performed of bilateral carotid and vertebral arteries in the neck. COMPARISON:  None. FINDINGS: Criteria: Quantification of carotid stenosis is based on velocity parameters that correlate the residual internal carotid diameter with NASCET-based stenosis levels, using the diameter of the distal internal carotid lumen as the denominator for stenosis measurement. The following velocity measurements were obtained: RIGHT ICA:  127/44 cm/sec CCA:  92/21 cm/sec SYSTOLIC ICA/CCA RATIO:  1.4 DIASTOLIC ICA/CCA RATIO:  2.1 ECA:  127 cm/sec LEFT ICA:  30/9 cm/sec CCA:  79/5 cm/sec SYSTOLIC ICA/CCA RATIO:  0.4 DIASTOLIC ICA/CCA RATIO:  1.7 ECA:  86 cm/sec RIGHT CAROTID ARTERY: There is a minimal amount of intimal thickening within the distal aspect the right common carotid artery (image 13), extending to involve the right carotid bulb (image 18). There is a minimal amount of eccentric mixed echogenic plaque involving the origin and proximal aspect of the right internal carotid artery (image 26), which results in borderline elevated peak systolic velocities within the proximal aspect the right internal carotid artery (greatest acquired peak systolic velocity with the proximal ICA measures 127 cm/sec - image 28). RIGHT VERTEBRAL ARTERY:  Antegrade Flow  LEFT CAROTID ARTERY: There is a minimal amount of eccentric hypoechoic plaque within the mid and distal aspects of the left common carotid artery (images 43 and 47). There is a large amount of eccentric mixed echogenic plaque within the left carotid bulb (images 50 and 52), morphologically resulting in at least 50% luminal narrowing though not definitely resulting elevated peak systolic velocities within the left internal carotid artery. LEFT VERTEBRAL ARTERY:  Antegrade flow IMPRESSION: 1. Minimal amount of right-sided atherosclerotic plaque results in elevated peak systolic velocities within the right internal carotid artery compatible with the lower end of the 50-69% luminal narrowing range. Further evaluation with CTA could performed as clinically indicated. 2. Large amount of left-sided atherosclerotic plaque, morphologically results in at least 50% luminal narrowing though does not definitely result in elevated peak systolic velocities within the left internal carotid artery to suggest a hemodynamically significant narrowing.  Again, this discrepancy could be further evaluated with CTA as indicated. Electronically Signed   By: Simonne ComeJohn  Watts M.D.   On: 06/09/2017 15:17    Scheduled Meds: . aspirin EC  81 mg Oral Daily  . atorvastatin  80 mg Oral Daily  . clopidogrel  75 mg Oral Daily  . cyanocobalamin  1,000 mcg Intramuscular Daily  . enoxaparin (LOVENOX) injection  40 mg Subcutaneous Q24H  . feeding supplement (GLUCERNA SHAKE)  237 mL Oral BID BM  . gabapentin  400 mg Oral BID  . insulin aspart  0-9 Units Subcutaneous Q6H  . isosorbide mononitrate  30 mg Oral Daily  . levETIRAcetam  250 mg Oral BID  . levothyroxine  25 mcg Oral QAC breakfast  . temazepam  30 mg Oral QHS   Continuous Infusions: . sodium chloride 75 mL/hr at 06/09/17 1400     LOS: 2 days   Lantz Hermann, MD [facility] West HospitalFACP Hospitalist.   If 7PM-7AM, please contact night-coverage [email or web address] Password [email or web address] 06/11/2017, 12:03 PM

## 2017-06-12 LAB — GLUCOSE, CAPILLARY
GLUCOSE-CAPILLARY: 111 mg/dL — AB (ref 65–99)
Glucose-Capillary: 180 mg/dL — ABNORMAL HIGH (ref 65–99)
Glucose-Capillary: 193 mg/dL — ABNORMAL HIGH (ref 65–99)

## 2017-06-12 MED ORDER — METOPROLOL TARTRATE 25 MG PO TABS: 25.0000 mg | ORAL_TABLET | Freq: Two times a day (BID) | ORAL | 1 refills | Status: DC

## 2017-06-12 MED ORDER — NITROGLYCERIN 0.4 MG SL SUBL: 0.4000 mg | SUBLINGUAL_TABLET | SUBLINGUAL | 3 refills | Status: AC | PRN

## 2017-06-12 MED ORDER — LEVETIRACETAM 250 MG PO TABS
250.0000 mg | ORAL_TABLET | Freq: Two times a day (BID) | ORAL | 3 refills | Status: DC
Start: 2017-06-12 — End: 2017-12-27

## 2017-06-12 MED ORDER — ISOSORBIDE MONONITRATE ER 30 MG PO TB24: 30.0000 mg | ORAL_TABLET | Freq: Every day | ORAL | 0 refills | Status: AC

## 2017-06-12 NOTE — Progress Notes (Signed)
Discharge instructions and prescriptions given, verbalized understanding, out in stable condition via w/c with staff. 

## 2017-06-12 NOTE — Discharge Summary (Signed)
Physician Discharge Summary  Shannon [name] TMH:[id number] DOB: 03/24/1951 DOA: 06/08/2017  PCP: Nila NephewGreen, Edwin, MD  Admit date: 06/08/2017 Discharge date: 06/12/2017  Admitted From: Home.  Disposition:  Home.   Recommendations for Outpatient Follow-up:  1. Follow up with PCP in 1-2 weeks 2. Follow up with Dr Gerilyn Pilgrimoonquah as scheduled.   Home Health: None.  Equipment/Devices: has walker. Discharge Condition: No new neuro deficit.  CODE STATUS: FULL CODE.  Diet recommendation: Carb modified cardiac diet.   Brief/Interim Summary:  Patient was admitted with right hand numbness and weakness by Dr Sharl MaLama on June 09, 2017.  As per his H and P:  "  Shannon [name]  is a 66 y.o. female, with history of right posterior cerebral artery distribution stroke with left-sided numbness and dense left homonymous visual field deficit, recent MRI on 04-10-17 showed multiple foci of acute infarction in left MCA territory consistent with microembolic infarctions, old infarction right PCA territory, TEE showed no cardioembolic source, recent loop recorder insertion by cardiology on 05/25/2017, diabetes mellitus, hypothyroidism, hypertension came to hospital with dizziness, blurred vision and chest pain. Patient says that her vision has been not good and she also had weakness of right upper extremity over the past 2 weeks that she cannot hold things in her right hand. Complains of dizziness while walking and have trouble keeping her balance.  Patient had very brief episode of chest pain which is now resolved. She admits to mild shortness of breath. She denies nausea vomiting or diarrhea. No fever or dysuria.  HOSPITAL COURSE:   66 yo with hx of " 8 strokes", DM, HTN, hypothyroidism, on DUAT with compliance, admitted with dizziness, blurry Vx, and chest pain. Her MRI showed acute CVA in the pre central gyrus with resolving area of subacute ischemia in the left centrum semiovale, along with prior old CVAs. She also had  atypical chest pain with negative troponins.  Her oral hypoglycemic agents were held, and she has agreed to take sensitve insulin coverage. She said it gave her diarrhea, but this time, it did not. She had TEE and recent loop recorder inserted by cardiology July 2018. US carotid showed non critical but extensive plagues. She was seen in consultation with Neurology, and Dr Jerre Simonooquah did not feel that she should be on full anticoagulation, and recommended to continue with DUAT.   EEG showed seizure activities and neurology started her on low dose Keppra.  She would like to see him in follow up.  She will be resuming her oral antiglycemics. Seizure precautions were discussed.  For her hypothyroidism, she is to continue with her thyroid supplement.  Thank you for allowing me to participate in her care.  Good Day.   Discharge Diagnoses:  Active Problems:   Acute CVA -Dec 2015   DM type 2 (diabetes mellitus, type 2) (HCC)   Weakness   Hypothyroidism   Visual disturbance   Dizziness   Malnutrition of moderate degree   Discharge Instructions  Discharge Instructions    Diet - low sodium heart healthy    Complete by:  As directed    Discharge instructions    Complete by:  As directed    Take your medications as directed.  See Dr Jerre Simonooquah in his office for follow up.   Increase activity slowly    Complete by:  As directed      Allergies as of 06/12/2017      Reactions   Insulins Other (See Comments)   Causes severe diarrhea  Sulfa Antibiotics Other (See Comments)   Childhood allergy, can't remember   Lisinopril Cough      Medication List    STOP taking these medications   FENUGREEK PO   freestyle lancets   hydrOXYzine 25 MG tablet Commonly known as:  ATARAX/VISTARIL   ibuprofen 200 MG tablet Commonly known as:  ADVIL,MOTRIN   temazepam 30 MG capsule Commonly known as:  RESTORIL   vitamin E 400 UNIT capsule Generic drug:  vitamin E     TAKE these medications   amLODipine  10 MG tablet Commonly known as:  NORVASC Take 10 mg by mouth daily.   aspirin EC 81 MG tablet Take 1 tablet (81 mg total) by mouth daily.   atorvastatin 80 MG tablet Commonly known as:  LIPITOR Take 1 tablet (80 mg total) by mouth daily.   b complex vitamins tablet Take 1 tablet by mouth daily.   Chromium 200 MCG Caps Take 1 capsule by mouth daily.   clopidogrel 75 MG tablet Commonly known as:  PLAVIX TAKE 1 TABLET (75 MG TOTAL) BY MOUTH DAILY WITH BREAKFAST.   co-enzyme Q-10 30 MG capsule Take 30 mg by mouth daily.   gabapentin 400 MG capsule Commonly known as:  NEURONTIN Take 400 mg by mouth 2 (two) times daily.   GARLIC PO Take 1 capsule by mouth daily.   glimepiride 4 MG tablet Commonly known as:  AMARYL Take 4 mg by mouth 2 (two) times daily.   isosorbide mononitrate 30 MG 24 hr tablet Commonly known as:  IMDUR Take 1 tablet (30 mg total) by mouth daily. DUE APPT IN AUGUST, MAKE APPT FOR FURTHER REFILLS, CALL [phone] TO SCHEDULE   JANUVIA 100 MG tablet Generic drug:  sitaGLIPtin Take 100 mg by mouth daily.   levETIRAcetam 250 MG tablet Commonly known as:  KEPPRA Take 1 tablet (250 mg total) by mouth 2 (two) times daily.   levothyroxine 25 MCG tablet Commonly known as:  SYNTHROID, LEVOTHROID TAKE 1 TABLET (25 MCG TOTAL) BY MOUTH DAILY BEFORE BREAKFAST.   losartan 100 MG tablet Commonly known as:  COZAAR Take 100 mg by mouth daily.   metoprolol tartrate 25 MG tablet Commonly known as:  LOPRESSOR TAKE 1 TABLET (25 MG TOTAL) BY MOUTH 2 (TWO) TIMES DAILY.   multivitamin capsule Take 1 capsule by mouth daily. Senior vitamin   nitroGLYCERIN 0.4 MG SL tablet Commonly known as:  NITROSTAT Place 1 tablet (0.4 mg total) under the tongue every 5 (five) minutes as needed.   omega-3 acid ethyl esters 1 g capsule Commonly known as:  LOVAZA Take 1 g by mouth daily.   vitamin C 100 MG tablet Take 100 mg by mouth daily.   Vitamin D-3 1000 units  Caps Take 1 capsule by mouth daily.      Follow-up Information    [facility].   Contact information: [address] [place] [address] [phone]          Allergies  Allergen Reactions  . Insulins Other (See Comments)    Causes severe diarrhea  . Sulfa Antibiotics Other (See Comments)    Childhood allergy, can't remember  . Lisinopril Cough    Consultations:  Neurology.   Procedures/Studies: Ct Angio Head W Or Wo Contrast  Addendum Date: 06/10/2017   ADDENDUM REPORT: 06/10/2017 08:25 ADDENDUM: Study discussed by telephone with Dr. Beryle Beams on 06/10/2017 at 0820 hours. Electronically Signed   By: Odessa Fleming M.D.   On:  06/10/2017 08:25   Result Date: 06/10/2017 CLINICAL DATA:  66 year old female with dizziness. Left MCA territory ischemia, including a small acute infarct at the left motor strip on brain MRI yesterday. Chronic right PCA infarct and right PCA occlusion. EXAM: CT ANGIOGRAPHY HEAD TECHNIQUE: Multidetector CT imaging of the head was performed using the standard protocol during bolus administration of intravenous contrast. Multiplanar CT image reconstructions and MIPs were obtained to evaluate the vascular anatomy. CONTRAST:  75 mL Isovue 370 COMPARISON:  Brain MRI 06/09/2017, 04/10/2017. Head CT without contrast 06/09/2017. CTA head and neck 06/16/2016. FINDINGS: CT HEAD Brain: Chronic encephalomalacia in the right PCA territory, and to a lesser extent the left parietal lobe. No acute intracranial hemorrhage identified. No midline shift, mass effect, or evidence of intracranial mass lesion. Stable gray-white matter differentiation since the CT on 06/09/2017. Stable ventricle size and configuration. Calvarium and skull base: No acute osseous abnormality identified. Paranasal sinuses: Visualized paranasal sinuses and mastoids are stable and well pneumatized. Orbits: No acute orbit or scalp soft tissue findings. CTA HEAD Posterior  circulation: Stable distal vertebral arteries. PICA origins remain patent. Patent vertebrobasilar junction. No basilar stenosis. SCA and left PCA origins are patent. Chronic right PCA occlusion. Posterior communicating arteries are diminutive or absent. There is mild to moderate left P1 segment stenosis, stable. Left PCA branches are within normal limits. Anterior circulation: Extensive calcified atherosclerosis of both ICA siphons which remain patent. Mild right ICA siphon stenosis at the anterior genu does not appear hemodynamically significant. But there is moderate to severe left ICA siphon stenosis at both the cavernous segment (series 14, image 90) and supraclinoid segment (series 10, image 33). Both carotid termini remain patent, although there is severe stenosis at both ACA origins, and progressed severe left MCA and proximal M1 segment stenosis or short segment thrombosis. There is a 4-5 mm segment of non enhancement of the proximal left MCA (series 11, image 10). Despite this the left MCA bifurcation is patent, although diminutive compared to the right side. Mild generalized left MCA branch irregularity is noted with no left MCA branch occlusion identified. The right MCA origin is mildly irregular. The right M1 segment is patent without stenosis. The right MCA bifurcation is patent. No right MCA branch occlusion or significant irregularity. Severe bilateral A1 stenosis, with chronically poor flow or occlusion of the right ACA A2 segment distal to the right frontopolar branch (series 15, image 18). This right ACA abnormality has progressed since 2017. Distal left ACA branches are mildly irregular. The anterior communicating artery remains patent. Venous sinuses: Patent. Anatomic variants: None. Delayed phase: No abnormal enhancement identified. Review of the MIP images confirms the above findings IMPRESSION: 1. Severe chronic intracranial atherosclerosis with progressed anterior circulation disease since  2017. 2. 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 Patent but decreased flow at the Left MCA bifurcation. No left MCA branch occlusion. 3. Progressed poor flow/thrombosis of the Right ACA A2 segment since 2017. Continued severe bilateral A1 stenosis. 4. Underlying moderate to severe multifocal Left ICA siphon stenosis. 5. Chronic right PCA occlusion, and stable moderate stenosis of the left PCA P1 segment. 6. Stable CT appearance of the brain since 06/09/2017. No new intracranial abnormality identified. Electronically Signed: By: Odessa Fleming M.D. On: 06/10/2017 08:19   Dg Chest 2 View  Result Date: 06/09/2017 CLINICAL DATA:  Dizziness and chest tightness for the past 2 days. Shortness of breath. EXAM: CHEST  2 VIEW COMPARISON:  04/09/2017. FINDINGS:  Normal sized heart. Clear lungs with normal vascularity. Unremarkable bones. IMPRESSION: No acute abnormality. Electronically Signed   By: Beckie Salts M.D.   On: 06/09/2017 00:03   Ct Head Wo Contrast  Result Date: 06/09/2017 CLINICAL DATA:  Dizziness and blurred vision since 06/07/2017. History of stroke. EXAM: CT HEAD WITHOUT CONTRAST TECHNIQUE: Contiguous axial images were obtained from the base of the skull through the vertex without intravenous contrast. COMPARISON:  04/24/2017 FINDINGS: Brain: Patchy low-attenuation areas throughout the deep white matter consistent with small vessel ischemia. Old infarcts demonstrated in the right occipital lobe, left posterior parietal lobe, and left cerebellum. No changes since previous study. No mass effect or midline shift. No abnormal extra-axial fluid collections. Gray-white matter junctions are distinct. Basal cisterns are not effaced. No ventricular dilatation. No evidence of acute intracranial hemorrhage. Vascular: Vascular calcifications are present. Skull: No depressed skull fractures. Sinuses/Orbits: No acute finding. Other: None. IMPRESSION: No acute intracranial  abnormalities. Chronic small vessel ischemic changes. Old infarcts in the right occipital, left posterior parietal, and left cerebellar regions. Electronically Signed   By: Burman Nieves M.D.   On: 06/09/2017 01:25   Mr Brain Wo Contrast  Result Date: 06/09/2017 CLINICAL DATA:  Dizziness and blurred vision since 06/07/2017. History of stroke. EXAM: MRI HEAD WITHOUT CONTRAST TECHNIQUE: Multiplanar, multiecho pulse sequences of the brain and surrounding structures were obtained without intravenous contrast. COMPARISON:  MRI brain 04/10/2017.  CT head earlier today. FINDINGS: Brain: Changes of subacute infarction, watershed distribution, LEFT centrum semiovale were acute on the previous most recent MR on June 2nd 2018. There is a new area of restricted diffusion, 5 mm in diameter, representing acute infarction in the LEFT posterior frontal cortex, precentral gyrus, image 97 series 2, consistent with acute infarction. This is nonhemorrhagic. Cerebral atrophy. Chronic RIGHT PCA infarct with hemosiderin. Chronic microvascular ischemic change of a mild-to-moderate nature. Vascular: Flow voids are maintained. Skull and upper cervical spine: Normal marrow signal. Sinuses/Orbits: No layering fluid. Significant nasal septal deviation LEFT-to-RIGHT. No orbital findings. Other: None. IMPRESSION: There is a new subcentimeter area of acute LEFT hemisphere infarction, precentral gyrus, nonhemorrhagic. Resolving areas of subacute ischemia in the LEFT centrum semiovale. Chronic RIGHT PCA insult. Electronically Signed   By: Elsie Stain M.D.   On: 06/09/2017 08:51   US Carotid Bilateral  Result Date: 06/09/2017 CLINICAL DATA:  Vertigo. Blurred vision. Left-sided numbness and weakness. History of hypertension, hyperlipidemia, diabetes and CAD (post coronary stent placement). EXAM: BILATERAL CAROTID DUPLEX ULTRASOUND TECHNIQUE: Wallace Cullens scale imaging, color Doppler and duplex ultrasound were performed of bilateral carotid and  vertebral arteries in the neck. COMPARISON:  None. FINDINGS: Criteria: Quantification of carotid stenosis is based on velocity parameters that correlate the residual internal carotid diameter with NASCET-based stenosis levels, using the diameter of the distal internal carotid lumen as the denominator for stenosis measurement. The following velocity measurements were obtained: RIGHT ICA:  127/44 cm/sec CCA:  92/21 cm/sec SYSTOLIC ICA/CCA RATIO:  1.4 DIASTOLIC ICA/CCA RATIO:  2.1 ECA:  127 cm/sec LEFT ICA:  30/9 cm/sec CCA:  79/5 cm/sec SYSTOLIC ICA/CCA RATIO:  0.4 DIASTOLIC ICA/CCA RATIO:  1.7 ECA:  86 cm/sec RIGHT CAROTID ARTERY: There is a minimal amount of intimal thickening within the distal aspect the right common carotid artery (image 13), extending to involve the right carotid bulb (image 18). There is a minimal amount of eccentric mixed echogenic plaque involving the origin and proximal aspect of the right internal carotid artery (image 26), which results in borderline elevated peak  systolic velocities within the proximal aspect the right internal carotid artery (greatest acquired peak systolic velocity with the proximal ICA measures 127 cm/sec - image 28). RIGHT VERTEBRAL ARTERY:  Antegrade Flow LEFT CAROTID ARTERY: There is a minimal amount of eccentric hypoechoic plaque within the mid and distal aspects of the left common carotid artery (images 43 and 47). There is a large amount of eccentric mixed echogenic plaque within the left carotid bulb (images 50 and 52), morphologically resulting in at least 50% luminal narrowing though not definitely resulting elevated peak systolic velocities within the left internal carotid artery. LEFT VERTEBRAL ARTERY:  Antegrade flow IMPRESSION: 1. Minimal amount of right-sided atherosclerotic plaque results in elevated peak systolic velocities within the right internal carotid artery compatible with the lower end of the 50-69% luminal narrowing range. Further evaluation  with CTA could performed as clinically indicated. 2. Large amount of left-sided atherosclerotic plaque, morphologically results in at least 50% luminal narrowing though does not definitely result in elevated peak systolic velocities within the left internal carotid artery to suggest a hemodynamically significant narrowing. Again, this discrepancy could be further evaluated with CTA as indicated. Electronically Signed   By: Simonne ComeJohn  Watts M.D.   On: 06/09/2017 15:17       Subjective:  "I am ready to go home today."   Discharge Exam: Vitals:   06/12/17 0800 06/12/17 1200  BP: (!) 156/75 (!) 148/68  Pulse: 72 68  Resp: 18 18  Temp: 98.5 F (36.9 C) 98.3 F (36.8 C)   Vitals:   06/12/17 0000 06/12/17 0340 06/12/17 0800 06/12/17 1200  BP: 133/65 120/68 (!) 156/75 (!) 148/68  Pulse: 77 72 72 68  Resp: 18 18 18 18   Temp: 97.9 F (36.6 C) 97.6 F (36.4 C) 98.5 F (36.9 C) 98.3 F (36.8 C)  TempSrc: Oral Oral Oral Oral  SpO2: 97% 98% 98% 98%  Weight:      Height:        General: Pt is alert, awake, not in acute distress Cardiovascular: RRR, S1/S2 +, no rubs, no gallops Respiratory: CTA bilaterally, no wheezing, no rhonchi Abdominal: Soft, NT, ND, bowel sounds + Extremities: no edema, no cyanosis    The results of significant diagnostics from this hospitalization (including imaging, microbiology, ancillary and laboratory) are listed below for reference.     Microbiology: No results found for this or any previous visit (from the past 240 hour(s)).   Labs: BNP (last 3 results) No results for input(s): BNP in the last 8760 hours. Basic Metabolic Panel:  Recent Labs Lab 06/08/17 2303 06/09/17 0606  NA 140 139  K 3.8 3.2*  CL 106 104  CO2 25 24  GLUCOSE 126* 129*  BUN 16 13  CREATININE 1.00 0.88  CALCIUM 9.4 9.0   Liver Function Tests:  Recent Labs Lab 06/09/17 0606  AST 21  ALT 20  ALKPHOS 59  BILITOT 0.7  PROT 6.5  ALBUMIN 3.7   CBC:  Recent Labs Lab  06/08/17 2303 06/09/17 0606  WBC 9.4 9.6  HGB 13.9 14.1  HCT 41.0 41.9  MCV 82.0 82.0  PLT 197 204   Cardiac Enzymes:  Recent Labs Lab 06/09/17 0606 06/09/17 1213 06/09/17 1710  TROPONINI <0.03 <0.03 <0.03   BNP: Invalid input(s): POCBNP CBG:  Recent Labs Lab 06/11/17 1119 06/11/17 1827 06/12/17 0014 06/12/17 0601 06/12/17 1129  GLUCAP 145* 155* 193* 111* 180*   Thyroid function studies  Recent Labs  06/09/17 1900  TSH 2.303   Anemia  work up  Recent Labs  06/09/17 1900  VITAMINB12 145*   Urinalysis    Component Value Date/Time   COLORURINE COLORLESS (A) 04/24/2017 2039   APPEARANCEUR CLEAR 04/24/2017 2039   LABSPEC 1.001 (L) 04/24/2017 2039   PHURINE 6.0 04/24/2017 2039   GLUCOSEU NEGATIVE 04/24/2017 2039   HGBUR NEGATIVE 04/24/2017 2039   BILIRUBINUR NEGATIVE 04/24/2017 2039   BILIRUBINUR neg 04/13/2014 1733   KETONESUR NEGATIVE 04/24/2017 2039   PROTEINUR NEGATIVE 04/24/2017 2039   UROBILINOGEN 0.2 04/13/2014 1733   NITRITE NEGATIVE 04/24/2017 2039   LEUKOCYTESUR SMALL (A) 04/24/2017 2039    Time coordinating discharge: Over 30 minutes SIGNED:  Houston Siren, MD FACP [facility] 06/12/2017, 1:36 PM   If 7PM-7AM, please contact night-coverage [email or web address] Password [email or web address]

## 2017-06-23 ENCOUNTER — Encounter (HOSPITAL_COMMUNITY): Payer: Self-pay | Admitting: Internal Medicine

## 2017-06-23 ENCOUNTER — Ambulatory Visit (HOSPITAL_COMMUNITY)
Admission: RE | Admit: 2017-06-23 | Discharge: 2017-06-23 | Disposition: A | Discharge status: Home / Self Care | Payer: Medicare HMO | Source: Ambulatory Visit | Attending: Internal Medicine | Admitting: Internal Medicine

## 2017-06-23 ENCOUNTER — Encounter (HOSPITAL_COMMUNITY)
Admission: RE | Disposition: A | Discharge status: Home / Self Care | Payer: Self-pay | Source: Ambulatory Visit | Attending: Internal Medicine

## 2017-06-23 DIAGNOSIS — E119 Type 2 diabetes mellitus without complications: Secondary | ICD-10-CM | POA: Diagnosis not present

## 2017-06-23 DIAGNOSIS — E785 Hyperlipidemia, unspecified: Secondary | ICD-10-CM | POA: Diagnosis not present

## 2017-06-23 DIAGNOSIS — Z955 Presence of coronary angioplasty implant and graft: Secondary | ICD-10-CM | POA: Insufficient documentation

## 2017-06-23 DIAGNOSIS — Z7902 Long term (current) use of antithrombotics/antiplatelets: Secondary | ICD-10-CM | POA: Insufficient documentation

## 2017-06-23 DIAGNOSIS — I10 Essential (primary) hypertension: Secondary | ICD-10-CM | POA: Diagnosis not present

## 2017-06-23 DIAGNOSIS — Z7982 Long term (current) use of aspirin: Secondary | ICD-10-CM | POA: Diagnosis not present

## 2017-06-23 DIAGNOSIS — Z823 Family history of stroke: Secondary | ICD-10-CM | POA: Diagnosis not present

## 2017-06-23 DIAGNOSIS — I4891 Unspecified atrial fibrillation: Secondary | ICD-10-CM | POA: Insufficient documentation

## 2017-06-23 DIAGNOSIS — Z7984 Long term (current) use of oral hypoglycemic drugs: Secondary | ICD-10-CM | POA: Insufficient documentation

## 2017-06-23 DIAGNOSIS — I2510 Atherosclerotic heart disease of native coronary artery without angina pectoris: Secondary | ICD-10-CM | POA: Diagnosis not present

## 2017-06-23 DIAGNOSIS — I471 Supraventricular tachycardia: Secondary | ICD-10-CM | POA: Insufficient documentation

## 2017-06-23 DIAGNOSIS — I69359 Hemiplegia and hemiparesis following cerebral infarction affecting unspecified side: Secondary | ICD-10-CM | POA: Diagnosis not present

## 2017-06-23 DIAGNOSIS — R0602 Shortness of breath: Secondary | ICD-10-CM | POA: Insufficient documentation

## 2017-06-23 DIAGNOSIS — Z79899 Other long term (current) drug therapy: Secondary | ICD-10-CM | POA: Diagnosis not present

## 2017-06-23 DIAGNOSIS — Z8249 Family history of ischemic heart disease and other diseases of the circulatory system: Secondary | ICD-10-CM | POA: Diagnosis not present

## 2017-06-23 DIAGNOSIS — I639 Cerebral infarction, unspecified: Secondary | ICD-10-CM | POA: Diagnosis not present

## 2017-06-23 DIAGNOSIS — I6330 Cerebral infarction due to thrombosis of unspecified cerebral artery: Secondary | ICD-10-CM | POA: Diagnosis present

## 2017-06-23 HISTORY — PX: LOOP RECORDER INSERTION: EP1214

## 2017-06-23 LAB — GLUCOSE, CAPILLARY: GLUCOSE-CAPILLARY: 155 mg/dL — AB (ref 65–99)

## 2017-06-23 SURGERY — LOOP RECORDER INSERTION

## 2017-06-23 MED ORDER — LIDOCAINE-EPINEPHRINE 1 %-1:100000 IJ SOLN
INTRAMUSCULAR | Status: DC | PRN
  Administered 2017-06-23: 30 mL

## 2017-06-23 MED ORDER — LIDOCAINE-EPINEPHRINE 1 %-1:100000 IJ SOLN
INTRAMUSCULAR | Status: AC
  Filled 2017-06-23: qty 1

## 2017-06-23 SURGICAL SUPPLY — 2 items
LOOP REVEAL LINQSYS (Prosthesis & Implant Heart) ×3 IMPLANT
PACK LOOP INSERTION (CUSTOM PROCEDURE TRAY) ×3 IMPLANT

## 2017-06-23 NOTE — H&P (View-Only) (Signed)
ELECTROPHYSIOLOGY CONSULT NOTE  Patient ID: Shannon [name], MRN: [id number], DOB/AGE: 68/02/1951 68 y.o. Admit date: (Not on file) Date of Consult: 05/25/2017  Primary Physician: Shannon [name], Edwin, MD Primary Cardiologist: Shannon [name]   Shannon [name] is being seen today for the evaluation of ILR  at the request of  Dr Ival BibleS McDowell.and Skains   HPI Shannon [name] Shannon [name] is a 66 y.o. female referred for consideration of implantable loop recorder  She has a history of ischemic heart disease with prior stenting of her LAD in 2011 a stroke 2015 and a recurrent stroke 2018.  She has a history of multiple strokes. These occurred despite ongoing therapy with aspirin and Plavix.  Echocardiogram 3/18 demonstrated normal LV function on presentation with rather nondescript symptoms 6/18 she underwent MRI scanning showing "multiple foci of acute infarction in the left MCA territory "  Her telemetry at that time was  personally reviewed and demonstrated nonsustained ventricular tachycardia and nonsustained atrial tachycardia-possibly MAT as there is moderate irregularity but  Mostly detectable P waves  She denies chest pain. She does have chronic shortness of breath. She has some edema. She takes amlodipine.  She has had dizziness since the last stroke   Past Medical History:  Diagnosis Date  . Anxiety   . Arthritis   . Coronary artery disease    a. s/p PCI with DES to LAD in 2011  (LHC 03/2010: Mid LAD 70%, normal ejection fraction  >> PCI:  2.75 x 15 mm Promus DES to LAD); b. Nuclear Stress Test 05/29/14: No ischemia, EF 59%, Normal; c. NSTEMI 11/2014 >> patient declined LHC b/c of recent CVA  . Depression   . Diabetes mellitus without complication (HCC)   . Dyslipidemia 11/20/2014  . Fibromyalgia 1995  . H/O ischemic right PCA stroke 10/2014   a. Carotid US 10/24/14: No significant ICA stenosis bilaterally  . Hemiparesis and alteration of sensations as late effects of stroke (HCC) 06/05/2015  . Hx  of echocardiogram    a. Echocardiogram 10/25/14: Moderate LVH, EF 50-55%, normal wall motion, grade 2 diastolic dysfunction, mild LAE, atrial septal lipomatous hypertrophy  . Hypertension   . Irritable bowel       Surgical History:  Past Surgical History:  Procedure Laterality Date  . CHOLECYSTECTOMY    . CORONARY STENT PLACEMENT    . orif arm fracture Right    forearm  . TEE WITHOUT CARDIOVERSION N/A 04/12/2017   Procedure: TRANSESOPHAGEAL ECHOCARDIOGRAM (TEE) WITH PROPOFOL;  Surgeon: Jonelle SidleMcDowell, Samuel G, MD;  Location: [facility];  Service: Cardiovascular;  Laterality: N/A;     Home Meds: Prior to Admission medications   Medication Sig Start Date End Date Taking? Authorizing Provider  ALPRAZolam Prudy Feeler(XANAX) 0.5 MG tablet Take 0.5 mg by mouth at bedtime.  03/07/15   [provider]  amLODipine (NORVASC) 10 MG tablet Take 10 mg by mouth daily. 09/11/16   [provider]  Ascorbic Acid (VITAMIN C) 100 MG tablet Take 100 mg by mouth daily.    [provider]  aspirin EC 81 MG tablet Take 1 tablet (81 mg total) by mouth daily. 11/27/14   Tereso NewcomerWeaver, Scott T, PA-C  atorvastatin (LIPITOR) 80 MG tablet Take 1 tablet (80 mg total) by mouth daily. 05/03/17   Nilda RiggsMartin, Nancy Carolyn, NP  Cholecalciferol (VITAMIN D-3) 1000 UNITS CAPS Take 1 capsule by mouth daily.    [provider]  clopidogrel (PLAVIX) 75 MG tablet TAKE 1 TABLET (75 MG TOTAL) BY  MOUTH DAILY WITH BREAKFAST. 02/19/17   Jake Bathe, MD  gabapentin (NEURONTIN) 400 MG capsule Take 400 mg by mouth 2 (two) times daily. 07/29/16   [provider]  GARLIC PO Take 1 capsule by mouth daily.    [provider]  glimepiride (AMARYL) 4 MG tablet Take 4 mg by mouth 2 (two) times daily. 09/03/16   [provider]  hydrOXYzine (ATARAX/VISTARIL) 25 MG tablet Take 25 mg by mouth 3 (three) times daily as needed for anxiety.     [provider]  ibuprofen (ADVIL,MOTRIN) 200 MG tablet Take  200-400 mg by mouth every 6 (six) hours as needed.    [provider]  isosorbide mononitrate (IMDUR) 30 MG 24 hr tablet Take 1 tablet (30 mg total) by mouth daily. DUE APPT IN AUGUST, MAKE APPT FOR FURTHER REFILLS, CALL [phone] TO SCHEDULE 04/16/17   Jake Bathe, MD  JANUVIA 100 MG tablet Take 100 mg by mouth daily. 09/11/16   [provider]  Lancets (FREESTYLE) lancets Check blood sugar four times per day. Before each meal and at bedtime 07/19/14   Holland Commons A, NP  levothyroxine (SYNTHROID, LEVOTHROID) 25 MCG tablet TAKE 1 TABLET (25 MCG TOTAL) BY MOUTH DAILY BEFORE BREAKFAST. 07/30/15   Jake Bathe, MD  losartan (COZAAR) 100 MG tablet Take 100 mg by mouth daily. 07/29/16   [provider]  metoprolol tartrate (LOPRESSOR) 25 MG tablet TAKE 1 TABLET (25 MG TOTAL) BY MOUTH 2 (TWO) TIMES DAILY. 02/20/16   Jake Bathe, MD  Multiple Vitamin (MULTIVITAMIN) capsule Take 1 capsule by mouth daily. Senior vitamin    [provider]  nitroGLYCERIN (NITROSTAT) 0.4 MG SL tablet Place 1 tablet (0.4 mg total) under the tongue every 5 (five) minutes as needed. 11/27/14   Tereso Newcomer T, PA-C  temazepam (RESTORIL) 15 MG capsule Take 30 mg by mouth at bedtime.     [provider]  vitamin E (VITAMIN E) 400 UNIT capsule Take 400 Units by mouth every morning.    [provider]    Allergies:  Allergies  Allergen Reactions  . Insulins Other (See Comments)    Causes severe diarrhea  . Sulfa Antibiotics Other (See Comments)    Childhood allergy, can't remember  . Lisinopril Cough    Social History   Social History  . Marital status: Married    Spouse name: N/A  . Number of children: 4  . Years of education: N/A   Occupational History  . retired    Social History Main Topics  . Smoking status: Never Smoker  . Smokeless tobacco: Never Used  . Alcohol use No  . Drug use: No  . Sexual activity: Not on file   Other Topics Concern  .  Not on file   Social History Narrative   Patient is right handed.   Patient drinks about 1 coke daily.   Separated from husband. 03-29-15    Living alone but close to family.       Family History  Problem Relation Age of Onset  . Cancer Mother   . Diabetes Mother   . Hypertension Mother   . Heart failure Mother   . CAD Mother   . Heart attack Mother   . Cancer Father   . Diabetes Father   . Hypertension Father   . Heart failure Father   . CAD Father   . Heart attack Father   . Stroke Father   . CAD  Brother   . Diabetes Brother   . Hypertension Brother   . CAD Sister   . Diabetes Sister   . Hypertension Sister   . Heart attack Sister   . Heart attack Brother   . Stroke Maternal Aunt      ROS:  Please see the history of present illness.     All other systems reviewed and negative.    Physical Exam:  Blood pressure 130/72, pulse 86, height 5\' 4"  (1.626 m), weight 146 lb (66.2 kg), SpO2 98 %. General: Well developed, well nourishedand somewhat disheveled female in no acute distress. Head: Normocephalic, atraumatic, sclera non-icteric, no xanthomas, nares are without discharge. EENT: normal  Lymph Nodes:  none Neck: Negative for carotid bruits. JVD not elevated. Back:without scoliosis kyphosis*  Lungs: Clear bilaterally to auscultation without wheezes, rales, or rhonchi. Breathing is unlabored. Heart: RRR with S1 S2. No   murmur . No rubs, or gallops appreciated. Abdomen: Soft, non-tender, non-distended with normoactive bowel sounds. No hepatomegaly. No rebound/guarding. No obvious abdominal masses. Msk:  Strength and tone appear normal for age. Extremities: No clubbing or cyanosis. tr edema.  Distal pedal pulses are 2+ and equal bilaterally. Skin: Warm and Dry Neuro: Alert and oriented X 3. CN III-XII intact Grossly normal sensory and motor function . Psych:  Responds to questions appropriately with a flat affect.      Labs: Cardiac Enzymes No results for  input(s): CKTOTAL, CKMB, TROPONINI in the last 72 hours. CBC Lab Results  Component Value Date   WBC 7.7 04/24/2017   HGB 13.1 04/24/2017   HCT 39.2 04/24/2017   MCV 83.4 04/24/2017   PLT 242 04/24/2017   PROTIME: No results for input(s): LABPROT, INR in the last 72 hours. Chemistry No results for input(s): NA, K, CL, CO2, BUN, CREATININE, CALCIUM, PROT, BILITOT, ALKPHOS, ALT, AST, GLUCOSE in the last 168 hours.  Invalid input(s): LABALBU Lipids Lab Results  Component Value Date   CHOL 146 12/28/2014   HDL 41.90 12/28/2014   LDLCALC 77 12/28/2014   TRIG 137.0 12/28/2014   BNP No results found for: PROBNP Thyroid Function Tests: No results for input(s): TSH, T4TOTAL, T3FREE, THYROIDAB in the last 72 hours.  Invalid input(s): FREET3 Miscellaneous No results found for: DDIMER  Radiology/Studies:  No results found.  EKG: sinus @ 86 13/08/38 occ PVC   Assessment and Plan:  Cryptogenic Stroke multiple  CAD--prior stenting  Atrial tach  Dyspnea   Patient has had multiple strokes. Telemetry has been revealing for atrial fibrillation is reasonable to pursue loop recorder insertion guide therapy  Her lipids last checked 3/18 LDL 105  Should be recheck on 80 atorva as was recently increased from 40     Sherryl MangesSteven Karen Huhta

## 2017-06-23 NOTE — Interval H&P Note (Signed)
History and Physical Interval Note:  06/23/2017 12:56 PM  Greggory StallionEsther B Brooke  has presented today for surgery, with the diagnosis of stroke  The various methods of treatment have been discussed with the patient and family. After consideration of risks, benefits and other options for treatment, the patient has consented to  Procedure(s): Loop Recorder Insertion (N/A) as a surgical intervention .  The patient's history has been reviewed, patient examined, no change in status, stable for surgery.  I have reviewed the patient's chart and labs.  Questions were answered to the patient's satisfaction.     Sherryl MangesSteven Kinlee Garrison

## 2017-07-07 ENCOUNTER — Other Ambulatory Visit: Payer: Self-pay | Admitting: Neurology

## 2017-07-07 DIAGNOSIS — R51 Headache: Principal | ICD-10-CM

## 2017-07-07 DIAGNOSIS — M542 Cervicalgia: Secondary | ICD-10-CM

## 2017-07-07 DIAGNOSIS — R519 Headache, unspecified: Secondary | ICD-10-CM

## 2017-07-08 ENCOUNTER — Ambulatory Visit (INDEPENDENT_AMBULATORY_CARE_PROVIDER_SITE_OTHER): Payer: Medicare HMO | Admitting: *Deleted

## 2017-07-08 DIAGNOSIS — I639 Cerebral infarction, unspecified: Secondary | ICD-10-CM

## 2017-07-08 LAB — CUP PACEART INCLINIC DEVICE CHECK
Date Time Interrogation Session: 20180830120029
Implantable Pulse Generator Implant Date: 20180815

## 2017-07-08 NOTE — Progress Notes (Signed)
Loop wound check in clinic. Dressing previously removed. Incision edges approximated, wound well healed. Battery status: good. R-waves 0.6233mV. No episodes. Patient educated about wound care and Carelink monitoring. Monthly summary reports and ROV with Dr. Graciela HusbandsKlein in 12 months.

## 2017-07-13 ENCOUNTER — Ambulatory Visit: Payer: Medicare HMO | Admitting: Neurology

## 2017-07-13 ENCOUNTER — Telehealth: Payer: Self-pay

## 2017-07-13 NOTE — Telephone Encounter (Signed)
PT WAS NO SHOW FOR APPT TODAY.

## 2017-07-14 ENCOUNTER — Encounter: Payer: Self-pay | Admitting: Neurology

## 2017-07-23 ENCOUNTER — Ambulatory Visit (INDEPENDENT_AMBULATORY_CARE_PROVIDER_SITE_OTHER): Payer: Medicare HMO | Admitting: *Deleted

## 2017-07-23 DIAGNOSIS — I639 Cerebral infarction, unspecified: Secondary | ICD-10-CM | POA: Diagnosis not present

## 2017-07-26 NOTE — Progress Notes (Signed)
Carelink Summary Report / Loop Recorder 

## 2017-07-27 LAB — CUP PACEART REMOTE DEVICE CHECK
Date Time Interrogation Session: 20180914164058
MDC IDC PG IMPLANT DT: 20180815

## 2017-08-12 ENCOUNTER — Other Ambulatory Visit: Payer: Self-pay | Admitting: Cardiology

## 2017-08-17 ENCOUNTER — Telehealth: Payer: Self-pay | Admitting: *Deleted

## 2017-08-17 NOTE — Telephone Encounter (Signed)
LMOVM requesting call back.  Patient had 41sec duration tachy episode on 08/16/17 at 1340.  Will determine if patient symptomatic with episode.  Dr. Ladona Ridgel reviewed ECG and recommended review by Dr. Graciela Husbands upon his return to the office.  Plan to continue current medications if patient asymptomatic with episode.

## 2017-08-23 ENCOUNTER — Ambulatory Visit (INDEPENDENT_AMBULATORY_CARE_PROVIDER_SITE_OTHER): Payer: Medicare HMO | Admitting: *Deleted

## 2017-08-23 DIAGNOSIS — I639 Cerebral infarction, unspecified: Secondary | ICD-10-CM | POA: Diagnosis not present

## 2017-08-23 NOTE — Progress Notes (Signed)
Carelink Summary Report / Loop Recorder 

## 2017-08-25 NOTE — Telephone Encounter (Signed)
Reviewed with Dr. Graciela HusbandsKlein, tachy episode on 08/16/17 is questionable for AFib, recommends to continue to monitor LINQ for longer episodes.

## 2017-08-27 LAB — CUP PACEART REMOTE DEVICE CHECK
Date Time Interrogation Session: 20181014164146
MDC IDC PG IMPLANT DT: 20180815

## 2017-08-31 ENCOUNTER — Other Ambulatory Visit: Payer: Self-pay | Admitting: Internal Medicine

## 2017-09-01 ENCOUNTER — Ambulatory Visit: Payer: Medicare HMO | Admitting: Cardiology

## 2017-09-02 ENCOUNTER — Encounter: Payer: Self-pay | Admitting: Cardiology

## 2017-09-21 ENCOUNTER — Ambulatory Visit (INDEPENDENT_AMBULATORY_CARE_PROVIDER_SITE_OTHER): Payer: Medicare HMO | Admitting: *Deleted

## 2017-09-21 DIAGNOSIS — I639 Cerebral infarction, unspecified: Secondary | ICD-10-CM

## 2017-09-21 NOTE — Progress Notes (Signed)
Carelink Summary Report / Loop Recorder 

## 2017-10-08 LAB — CUP PACEART REMOTE DEVICE CHECK
Date Time Interrogation Session: 20181113172425
MDC IDC PG IMPLANT DT: 20180815

## 2017-10-14 ENCOUNTER — Emergency Department (HOSPITAL_COMMUNITY)
Admission: EM | Admit: 2017-10-14 | Discharge: 2017-10-14 | Disposition: A | Discharge status: Home / Self Care | Payer: Medicare HMO | Attending: Emergency Medicine | Admitting: Emergency Medicine

## 2017-10-14 ENCOUNTER — Other Ambulatory Visit: Payer: Self-pay

## 2017-10-14 ENCOUNTER — Encounter (HOSPITAL_COMMUNITY): Payer: Self-pay | Admitting: Emergency Medicine

## 2017-10-14 ENCOUNTER — Emergency Department (HOSPITAL_COMMUNITY): Payer: Medicare HMO

## 2017-10-14 DIAGNOSIS — Z7984 Long term (current) use of oral hypoglycemic drugs: Secondary | ICD-10-CM | POA: Diagnosis not present

## 2017-10-14 DIAGNOSIS — I10 Essential (primary) hypertension: Secondary | ICD-10-CM | POA: Insufficient documentation

## 2017-10-14 DIAGNOSIS — E119 Type 2 diabetes mellitus without complications: Secondary | ICD-10-CM | POA: Diagnosis not present

## 2017-10-14 DIAGNOSIS — Z7902 Long term (current) use of antithrombotics/antiplatelets: Secondary | ICD-10-CM | POA: Insufficient documentation

## 2017-10-14 DIAGNOSIS — Z8673 Personal history of transient ischemic attack (TIA), and cerebral infarction without residual deficits: Secondary | ICD-10-CM | POA: Insufficient documentation

## 2017-10-14 DIAGNOSIS — E039 Hypothyroidism, unspecified: Secondary | ICD-10-CM | POA: Diagnosis not present

## 2017-10-14 DIAGNOSIS — R350 Frequency of micturition: Secondary | ICD-10-CM | POA: Diagnosis not present

## 2017-10-14 DIAGNOSIS — Z7982 Long term (current) use of aspirin: Secondary | ICD-10-CM | POA: Insufficient documentation

## 2017-10-14 DIAGNOSIS — R531 Weakness: Secondary | ICD-10-CM | POA: Diagnosis not present

## 2017-10-14 DIAGNOSIS — Z79899 Other long term (current) drug therapy: Secondary | ICD-10-CM | POA: Diagnosis not present

## 2017-10-14 DIAGNOSIS — I252 Old myocardial infarction: Secondary | ICD-10-CM | POA: Diagnosis not present

## 2017-10-14 DIAGNOSIS — I2510 Atherosclerotic heart disease of native coronary artery without angina pectoris: Secondary | ICD-10-CM | POA: Diagnosis not present

## 2017-10-14 LAB — URINALYSIS, ROUTINE W REFLEX MICROSCOPIC
Bilirubin Urine: NEGATIVE
GLUCOSE, UA: NEGATIVE mg/dL
Hgb urine dipstick: NEGATIVE
Ketones, ur: NEGATIVE mg/dL
LEUKOCYTES UA: NEGATIVE
NITRITE: NEGATIVE
PH: 6 (ref 5.0–8.0)
Protein, ur: NEGATIVE mg/dL
Specific Gravity, Urine: 1.002 — ABNORMAL LOW (ref 1.005–1.030)

## 2017-10-14 LAB — COMPREHENSIVE METABOLIC PANEL
ALBUMIN: 3.8 g/dL (ref 3.5–5.0)
ALT: 15 U/L (ref 14–54)
AST: 18 U/L (ref 15–41)
Alkaline Phosphatase: 64 U/L (ref 38–126)
Anion gap: 8 (ref 5–15)
BUN: 14 mg/dL (ref 6–20)
CHLORIDE: 105 mmol/L (ref 101–111)
CO2: 25 mmol/L (ref 22–32)
Calcium: 9.3 mg/dL (ref 8.9–10.3)
Creatinine, Ser: 0.93 mg/dL (ref 0.44–1.00)
GFR calc Af Amer: >60 mL/min (ref >60)
GFR calc non Af Amer: >60 mL/min (ref >60)
GLUCOSE: 132 mg/dL — AB (ref 65–99)
POTASSIUM: 4.3 mmol/L (ref 3.5–5.1)
SODIUM: 138 mmol/L (ref 135–145)
Total Bilirubin: 0.8 mg/dL (ref 0.3–1.2)
Total Protein: 6.2 g/dL — ABNORMAL LOW (ref 6.5–8.1)

## 2017-10-14 LAB — CBC WITH DIFFERENTIAL/PLATELET
BASOS ABS: 0 10*3/uL (ref 0.0–0.1)
BASOS PCT: 0 %
EOS ABS: 0.3 10*3/uL (ref 0.0–0.7)
Eosinophils Relative: 3 %
HEMATOCRIT: 42.2 % (ref 36.0–46.0)
Hemoglobin: 13.6 g/dL (ref 12.0–15.0)
Lymphocytes Relative: 24 %
Lymphs Abs: 2.2 10*3/uL (ref 0.7–4.0)
MCH: 27.4 pg (ref 26.0–34.0)
MCHC: 32.2 g/dL (ref 30.0–36.0)
MCV: 84.9 fL (ref 78.0–100.0)
MONO ABS: 0.8 10*3/uL (ref 0.1–1.0)
Monocytes Relative: 9 %
NEUTROS ABS: 5.7 10*3/uL (ref 1.7–7.7)
Neutrophils Relative %: 64 %
PLATELETS: 183 10*3/uL (ref 150–400)
RBC: 4.97 MIL/uL (ref 3.87–5.11)
RDW: 14.1 % (ref 11.5–15.5)
WBC: 9 10*3/uL (ref 4.0–10.5)

## 2017-10-14 LAB — CBG MONITORING, ED: Glucose-Capillary: 130 mg/dL — ABNORMAL HIGH (ref 65–99)

## 2017-10-14 MED ORDER — SODIUM CHLORIDE 0.9 % IV BOLUS (SEPSIS)
1000.0000 mL | Freq: Once | INTRAVENOUS | Status: AC
  Administered 2017-10-14: 1000 mL via INTRAVENOUS

## 2017-10-14 NOTE — ED Triage Notes (Signed)
Pt c/o generalized weakness today with body aches.

## 2017-10-14 NOTE — ED Provider Notes (Signed)
[facility] EMERGENCY DEPARTMENT Provider Note   CSN: [id number] Arrival date & time: 10/14/17  1802     History   Chief Complaint Chief Complaint  Patient presents with  . Weakness    HPI Shannon [name] is a 66 y.o. female.  HPI 66 year old female who presents with generalized weakness.  States that she has felt extremely fatigued starting today.  She has a history of diabetes, hypertension, coronary artery disease.  States that she was diagnosed with UTI 1 week ago, but cannot afford any of her prescriptions are did not take any of her antibiotic.  States that her urine does appear very dark in color and she has been pain frequently.  No significant abdominal pain, vomiting, diarrhea, cough or dyspnea.   Past Medical History:  Diagnosis Date  . Anxiety   . Arthritis   . Coronary artery disease    a. s/p PCI with DES to LAD in 2011  (LHC 03/2010: Mid LAD 70%, normal ejection fraction  >> PCI:  2.75 x 15 mm Promus DES to LAD); b. Nuclear Stress Test 05/29/14: No ischemia, EF 59%, Normal; c. NSTEMI 11/2014 >> patient declined LHC b/c of recent CVA  . Depression   . Diabetes mellitus without complication (HCC)   . Dyslipidemia 11/20/2014  . Fibromyalgia 1995  . H/O ischemic right PCA stroke 10/2014   a. Carotid US 10/24/14: No significant ICA stenosis bilaterally  . Hemiparesis and alteration of sensations as late effects of stroke (HCC) 06/05/2015  . Hx of echocardiogram    a. Echocardiogram 10/25/14: Moderate LVH, EF 50-55%, normal wall motion, grade 2 diastolic dysfunction, mild LAE, atrial septal lipomatous hypertrophy  . Hypertension   . Irritable bowel     Patient Active Problem List   Diagnosis Date Noted  . Malnutrition of moderate degree 06/09/2017  . CVA (cerebrovascular accident) (HCC) 04/10/2017  . Dizziness 04/09/2017  . Hemiparesis and alteration of sensations as late effects of stroke (HCC) 06/05/2015  . Numbness 12/27/2014  . Visual disturbance 11/26/2014  .  Dyslipidemia 11/20/2014  . Hypothyroidism 11/20/2014  . Pain   . Cerebral thrombosis with cerebral infarction (HCC) 11/19/2014  . Weakness   . NSTEMI (non-ST elevated myocardial infarction) (HCC) 11/17/2014  . Acute CVA -Dec 2015 10/24/2014  . DM type 2 (diabetes mellitus, type 2) (HCC) 10/24/2014  . Accelerated hypertension 10/24/2014  . Atypical chest pain 05/16/2014  . CAD S/P LAD PCI July 2011 07/01/2013  . Hypertension, essential 07/01/2013  . Fibromyalgia 07/01/2013  . IBS (irritable bowel syndrome) 07/01/2013    Past Surgical History:  Procedure Laterality Date  . CHOLECYSTECTOMY    . CORONARY STENT PLACEMENT    . LOOP RECORDER INSERTION N/A 06/23/2017   Procedure: Loop Recorder Insertion;  Surgeon: Duke Salvia, MD;  Location: [facility] INVASIVE CV LAB;  Service: Cardiovascular;  Laterality: N/A;  . orif arm fracture Right    forearm  . TEE WITHOUT CARDIOVERSION N/A 04/12/2017   Procedure: TRANSESOPHAGEAL ECHOCARDIOGRAM (TEE) WITH PROPOFOL;  Surgeon: Jonelle Sidle, MD;  Location: [facility];  Service: Cardiovascular;  Laterality: N/A;    OB History    Gravida Para Term Preterm AB Living   12 4 4   8      SAB TAB Ectopic Multiple Live Births                   Home Medications    Prior to Admission medications   Medication Sig Start Date End Date Taking?  Authorizing Provider  amLODipine (NORVASC) 10 MG tablet Take 10 mg by mouth daily. 09/11/16  Yes [provider]  aspirin EC 81 MG tablet Take 1 tablet (81 mg total) by mouth daily. 11/27/14  Yes Weaver, Scott T, PA-C  b complex vitamins tablet Take 1 tablet by mouth daily.   Yes [provider]  Cholecalciferol (VITAMIN D-3) 1000 UNITS CAPS Take 1,000 Units by mouth daily.    Yes [provider]  Chromium 200 MCG CAPS Take 200 mcg by mouth daily.    Yes [provider]  clopidogrel (PLAVIX) 75 MG tablet TAKE ONE TABLET ONCE DAILY WITH BREAKFAST 08/13/17  Yes Jake BatheSkains, Mark C, MD    co-enzyme Q-10 30 MG capsule Take 30 mg by mouth daily.   Yes [provider]  gabapentin (NEURONTIN) 400 MG capsule Take 400 mg by mouth 2 (two) times daily. 07/29/16  Yes [provider]  glimepiride (AMARYL) 4 MG tablet Take 4 mg by mouth 2 (two) times daily. 09/03/16  Yes [provider]  hydrOXYzine (ATARAX/VISTARIL) 25 MG tablet Take 25 mg by mouth 2 (two) times daily as needed for anxiety.  05/27/17  Yes [provider]  isosorbide mononitrate (IMDUR) 30 MG 24 hr tablet Take 1 tablet (30 mg total) by mouth daily. DUE APPT IN AUGUST, MAKE APPT FOR FURTHER REFILLS, CALL [phone] TO SCHEDULE Patient taking differently: Take 30 mg by mouth daily.  06/12/17  Yes Houston SirenLe, Peter, MD  JANUVIA 100 MG tablet Take 100 mg by mouth daily. 09/11/16  Yes [provider]  levothyroxine (SYNTHROID, LEVOTHROID) 25 MCG tablet TAKE 1 TABLET (25 MCG TOTAL) BY MOUTH DAILY BEFORE BREAKFAST. 07/30/15  Yes Jake BatheSkains, Mark C, MD  losartan (COZAAR) 100 MG tablet Take 100 mg by mouth daily. 07/29/16  Yes [provider]  metoprolol tartrate (LOPRESSOR) 25 MG tablet Take 1 tablet (25 mg total) by mouth 2 (two) times daily. 06/12/17  Yes Houston SirenLe, Peter, MD  Multiple Vitamin (MULTIVITAMIN) capsule Take 1 capsule by mouth daily. Senior vitamin   Yes [provider]  nitroGLYCERIN (NITROSTAT) 0.4 MG SL tablet Place 1 tablet (0.4 mg total) under the tongue every 5 (five) minutes as needed. Patient taking differently: Place 0.4 mg under the tongue every 5 (five) minutes as needed for chest pain.  06/12/17  Yes Houston SirenLe, Peter, MD  temazepam (RESTORIL) 15 MG capsule Take 30 mg by mouth at bedtime.    Yes [provider]  levETIRAcetam (KEPPRA) 250 MG tablet Take 1 tablet (250 mg total) by mouth 2 (two) times daily. Patient not taking: Reported on 06/23/2017 06/12/17   Houston SirenLe, Peter, MD    Family History Family History  Problem Relation Age of Onset  . Cancer Mother   . Diabetes  Mother   . Hypertension Mother   . Heart failure Mother   . CAD Mother   . Heart attack Mother   . Cancer Father   . Diabetes Father   . Hypertension Father   . Heart failure Father   . CAD Father   . Heart attack Father   . Stroke Father   . CAD Brother   . Diabetes Brother   . Hypertension Brother   . CAD Sister   . Diabetes Sister   . Hypertension Sister   . Heart attack Sister   . Heart attack Brother   . Stroke Maternal Aunt     Social History Social History   Tobacco Use  . Smoking status: Never  Smoker  . Smokeless tobacco: Never Used  Substance Use Topics  . Alcohol use: No    Alcohol/week: 0.0 oz  . Drug use: No     Allergies   Insulins; Sulfa antibiotics; and Lisinopril   Review of Systems Review of Systems  Constitutional: Positive for fatigue. Negative for fever.  Respiratory: Negative for cough and shortness of breath.   Gastrointestinal: Negative for abdominal pain, nausea and vomiting.  Genitourinary: Positive for frequency.  All other systems reviewed and are negative.    Physical Exam Updated Vital Signs BP 135/65 (BP Location: Left Arm)   Pulse (!) 57   Temp (!) 97.5 F (36.4 C) (Oral)   Resp 17   Ht 5\' 4"  (1.626 m)   Wt 69.9 kg (154 lb)   SpO2 98%   BMI 26.43 kg/m   Physical Exam Physical Exam  Nursing note and vitals reviewed. Constitutional: Well developed, well nourished, non-toxic, and in no acute distress Head: Normocephalic and atraumatic.  Mouth/Throat: Oropharynx is clear and moist.  Neck: Normal range of motion. Neck supple.  Cardiovascular: Normal rate and regular rhythm.   Pulmonary/Chest: Effort normal and breath sounds normal.  Abdominal: Soft. There is no tenderness. There is no rebound and no guarding.  Musculoskeletal: Normal range of motion.  Neurological: Alert, no facial droop, fluent speech, moves all extremities symmetrically Skin: Skin is warm and dry.  Psychiatric: Cooperative   ED Treatments /  Results  Labs (all labs ordered are listed, but only abnormal results are displayed) Labs Reviewed  COMPREHENSIVE METABOLIC PANEL - Abnormal; Notable for the following components:      Result Value   Glucose, Bld 132 (*)    Total Protein 6.2 (*)    All other components within normal limits  URINALYSIS, ROUTINE W REFLEX MICROSCOPIC - Abnormal; Notable for the following components:   Color, Urine STRAW (*)    Specific Gravity, Urine 1.002 (*)    All other components within normal limits  CBG MONITORING, ED - Abnormal; Notable for the following components:   Glucose-Capillary 130 (*)    All other components within normal limits  URINE CULTURE  CBC WITH DIFFERENTIAL/PLATELET    EKG  EKG Interpretation  Date/Time:  Thursday October 14 2017 18:08:13 EST Ventricular Rate:  53 PR Interval:    QRS Duration: 98 QT Interval:  489 QTC Calculation: 460 R Axis:   61 Text Interpretation:  Sinus rhythm No acute changes Confirmed by Crista CurbLiu, Dala Breault [phone]) on 10/14/2017 6:12:48 PM       Radiology Dg Chest 2 View  Result Date: 10/14/2017 CLINICAL DATA:  Generalized weakness with body aches. EXAM: CHEST  2 VIEW COMPARISON:  06/08/2017 FINDINGS: AP and lateral views are provided. The cardiopericardial silhouette is mildly enlarged. Mediastinal contours are stable without aneurysmal dilatation of the aorta. Loop recording device projects over the heart. Lungs are free of pneumonic consolidations. No effusion nor overt pulmonary edema. 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 Mild degenerative change noted along the dorsal spine. IMPRESSION: No active cardiopulmonary disease.  Mild cardiomegaly. Electronically Signed   By: Tollie Ethavid  Kwon M.D.   On: 10/14/2017 19:31    Procedures Procedures (including critical care time)  Medications Ordered in ED Medications  sodium chloride 0.9 % bolus 1,000 mL (0 mLs Intravenous Stopped 10/14/17 1950)     Initial Impression / Assessment and Plan /  ED Course  I have reviewed the triage vital signs and the nursing notes.  Pertinent labs & imaging results  that were available during my care of the patient were reviewed by me and considered in my medical decision making (see chart for details).     66 year old female who presents with generalized weakness for 1 day.  She is nontoxic in no acute distress.  Afebrile and hemodynamically stable.  No focal neurological deficits.  Remainder of exam is unremarkable she does not have evidence of a UTI today.  Chest x-ray visualized that shows no acute cardiopulmonary processes.  Blood work shows no major electrolyte or metabolic derangements.  Normal renal and liver function testing.  She is given some IV fluids and feels improved.  At this time I do not feel she requires hospitalization.  Will discharge with supportive care measures and have her closely follow-up with PCP for reevaluation. Strict return and follow-up instructions reviewed. She expressed understanding of all discharge instructions and felt comfortable with the plan of care.   Final Clinical Impressions(s) / ED Diagnoses   Final diagnoses:  Generalized weakness    ED Discharge Orders    None       Lavera Guise, MD 10/14/17 2109

## 2017-10-14 NOTE — ED Notes (Signed)
EDP at bedside  

## 2017-10-14 NOTE — Discharge Instructions (Signed)
Your blood work, including kidney function, liver function, electrolytes, and blood counts are very reassuring. You do not appear to have a UTI or pneumonia.    Please get rest and drink plenty of fluids, follow-up closely with your primary care doctor for recheck Return for worsening symptoms, including fever, inability to walk, confusion or any other symptoms concerning ot you.

## 2017-10-16 LAB — URINE CULTURE: CULTURE: NO GROWTH

## 2017-10-21 ENCOUNTER — Ambulatory Visit (INDEPENDENT_AMBULATORY_CARE_PROVIDER_SITE_OTHER): Payer: Medicare HMO | Admitting: *Deleted

## 2017-10-21 DIAGNOSIS — I639 Cerebral infarction, unspecified: Secondary | ICD-10-CM | POA: Diagnosis not present

## 2017-10-21 NOTE — Progress Notes (Signed)
Carelink Summary Report / Loop Recorder 

## 2017-11-03 LAB — CUP PACEART REMOTE DEVICE CHECK
MDC IDC PG IMPLANT DT: 20180815
MDC IDC SESS DTM: 20181213191037

## 2017-11-22 ENCOUNTER — Ambulatory Visit (INDEPENDENT_AMBULATORY_CARE_PROVIDER_SITE_OTHER): Payer: Medicare HMO | Admitting: *Deleted

## 2017-11-22 DIAGNOSIS — I639 Cerebral infarction, unspecified: Secondary | ICD-10-CM

## 2017-11-23 NOTE — Progress Notes (Signed)
Carelink Summary Report / Loop Recorder 

## 2017-12-01 LAB — CUP PACEART REMOTE DEVICE CHECK
Implantable Pulse Generator Implant Date: 20180815
MDC IDC SESS DTM: 20190112193942

## 2017-12-20 ENCOUNTER — Ambulatory Visit (INDEPENDENT_AMBULATORY_CARE_PROVIDER_SITE_OTHER): Payer: Medicare HMO | Admitting: *Deleted

## 2017-12-20 DIAGNOSIS — I639 Cerebral infarction, unspecified: Secondary | ICD-10-CM | POA: Diagnosis not present

## 2017-12-21 NOTE — Progress Notes (Signed)
Carelink Summary Report / Loop Recorder 

## 2017-12-27 ENCOUNTER — Emergency Department (HOSPITAL_COMMUNITY): Payer: Medicare HMO

## 2017-12-27 ENCOUNTER — Other Ambulatory Visit: Payer: Self-pay

## 2017-12-27 ENCOUNTER — Inpatient Hospital Stay (HOSPITAL_COMMUNITY)
Admission: EM | Admit: 2017-12-27 | Discharge: 2017-12-30 | DRG: 065 | Disposition: A | Discharge status: Skilled Nursing Facility | Payer: Medicare HMO | Attending: Internal Medicine | Admitting: Internal Medicine

## 2017-12-27 ENCOUNTER — Encounter (HOSPITAL_COMMUNITY): Payer: Self-pay | Admitting: Emergency Medicine

## 2017-12-27 DIAGNOSIS — R29706 NIHSS score 6: Secondary | ICD-10-CM | POA: Diagnosis present

## 2017-12-27 DIAGNOSIS — Z66 Do not resuscitate: Secondary | ICD-10-CM | POA: Diagnosis present

## 2017-12-27 DIAGNOSIS — Z7982 Long term (current) use of aspirin: Secondary | ICD-10-CM

## 2017-12-27 DIAGNOSIS — R29705 NIHSS score 5: Secondary | ICD-10-CM | POA: Diagnosis not present

## 2017-12-27 DIAGNOSIS — Z888 Allergy status to other drugs, medicaments and biological substances status: Secondary | ICD-10-CM

## 2017-12-27 DIAGNOSIS — Z823 Family history of stroke: Secondary | ICD-10-CM

## 2017-12-27 DIAGNOSIS — I081 Rheumatic disorders of both mitral and tricuspid valves: Secondary | ICD-10-CM | POA: Diagnosis present

## 2017-12-27 DIAGNOSIS — Z882 Allergy status to sulfonamides status: Secondary | ICD-10-CM | POA: Diagnosis not present

## 2017-12-27 DIAGNOSIS — R29810 Facial weakness: Secondary | ICD-10-CM | POA: Diagnosis present

## 2017-12-27 DIAGNOSIS — R471 Dysarthria and anarthria: Secondary | ICD-10-CM | POA: Diagnosis present

## 2017-12-27 DIAGNOSIS — H53462 Homonymous bilateral field defects, left side: Secondary | ICD-10-CM | POA: Diagnosis present

## 2017-12-27 DIAGNOSIS — I639 Cerebral infarction, unspecified: Secondary | ICD-10-CM | POA: Diagnosis not present

## 2017-12-27 DIAGNOSIS — I10 Essential (primary) hypertension: Secondary | ICD-10-CM | POA: Diagnosis not present

## 2017-12-27 DIAGNOSIS — Z9861 Coronary angioplasty status: Secondary | ICD-10-CM

## 2017-12-27 DIAGNOSIS — M797 Fibromyalgia: Secondary | ICD-10-CM | POA: Diagnosis present

## 2017-12-27 DIAGNOSIS — Z833 Family history of diabetes mellitus: Secondary | ICD-10-CM | POA: Diagnosis not present

## 2017-12-27 DIAGNOSIS — Z7984 Long term (current) use of oral hypoglycemic drugs: Secondary | ICD-10-CM

## 2017-12-27 DIAGNOSIS — Z79899 Other long term (current) drug therapy: Secondary | ICD-10-CM

## 2017-12-27 DIAGNOSIS — G40909 Epilepsy, unspecified, not intractable, without status epilepticus: Secondary | ICD-10-CM | POA: Diagnosis present

## 2017-12-27 DIAGNOSIS — Z7902 Long term (current) use of antithrombotics/antiplatelets: Secondary | ICD-10-CM

## 2017-12-27 DIAGNOSIS — E785 Hyperlipidemia, unspecified: Secondary | ICD-10-CM | POA: Diagnosis present

## 2017-12-27 DIAGNOSIS — N179 Acute kidney failure, unspecified: Secondary | ICD-10-CM | POA: Diagnosis present

## 2017-12-27 DIAGNOSIS — E118 Type 2 diabetes mellitus with unspecified complications: Secondary | ICD-10-CM | POA: Diagnosis not present

## 2017-12-27 DIAGNOSIS — K580 Irritable bowel syndrome with diarrhea: Secondary | ICD-10-CM | POA: Diagnosis present

## 2017-12-27 DIAGNOSIS — R29704 NIHSS score 4: Secondary | ICD-10-CM | POA: Diagnosis not present

## 2017-12-27 DIAGNOSIS — I2510 Atherosclerotic heart disease of native coronary artery without angina pectoris: Secondary | ICD-10-CM | POA: Diagnosis not present

## 2017-12-27 DIAGNOSIS — Z955 Presence of coronary angioplasty implant and graft: Secondary | ICD-10-CM | POA: Diagnosis not present

## 2017-12-27 DIAGNOSIS — E039 Hypothyroidism, unspecified: Secondary | ICD-10-CM | POA: Diagnosis present

## 2017-12-27 DIAGNOSIS — I69344 Monoplegia of lower limb following cerebral infarction affecting left non-dominant side: Secondary | ICD-10-CM | POA: Diagnosis not present

## 2017-12-27 DIAGNOSIS — E119 Type 2 diabetes mellitus without complications: Secondary | ICD-10-CM | POA: Diagnosis present

## 2017-12-27 DIAGNOSIS — Z8249 Family history of ischemic heart disease and other diseases of the circulatory system: Secondary | ICD-10-CM | POA: Diagnosis not present

## 2017-12-27 DIAGNOSIS — M25572 Pain in left ankle and joints of left foot: Secondary | ICD-10-CM

## 2017-12-27 DIAGNOSIS — Z79890 Hormone replacement therapy (postmenopausal): Secondary | ICD-10-CM

## 2017-12-27 DIAGNOSIS — Z8673 Personal history of transient ischemic attack (TIA), and cerebral infarction without residual deficits: Secondary | ICD-10-CM

## 2017-12-27 DIAGNOSIS — I361 Nonrheumatic tricuspid (valve) insufficiency: Secondary | ICD-10-CM | POA: Diagnosis not present

## 2017-12-27 DIAGNOSIS — I63412 Cerebral infarction due to embolism of left middle cerebral artery: Principal | ICD-10-CM | POA: Diagnosis present

## 2017-12-27 DIAGNOSIS — K589 Irritable bowel syndrome without diarrhea: Secondary | ICD-10-CM | POA: Diagnosis present

## 2017-12-27 LAB — I-STAT CHEM 8, ED
BUN: 21 mg/dL — AB (ref 6–20)
CHLORIDE: 102 mmol/L (ref 101–111)
Calcium, Ion: 1.02 mmol/L — ABNORMAL LOW (ref 1.15–1.40)
Creatinine, Ser: 2.7 mg/dL — ABNORMAL HIGH (ref 0.44–1.00)
Glucose, Bld: 257 mg/dL — ABNORMAL HIGH (ref 65–99)
HEMATOCRIT: 45 % (ref 36.0–46.0)
Hemoglobin: 15.3 g/dL — ABNORMAL HIGH (ref 12.0–15.0)
Potassium: 4.1 mmol/L (ref 3.5–5.1)
SODIUM: 136 mmol/L (ref 135–145)
TCO2: 22 mmol/L (ref 22–32)

## 2017-12-27 LAB — COMPREHENSIVE METABOLIC PANEL
ALBUMIN: 3.7 g/dL (ref 3.5–5.0)
ALT: 16 U/L (ref 14–54)
AST: 21 U/L (ref 15–41)
Alkaline Phosphatase: 86 U/L (ref 38–126)
Anion gap: 13 (ref 5–15)
BUN: 21 mg/dL — AB (ref 6–20)
CHLORIDE: 98 mmol/L — AB (ref 101–111)
CO2: 24 mmol/L (ref 22–32)
Calcium: 9.3 mg/dL (ref 8.9–10.3)
Creatinine, Ser: 2.76 mg/dL — ABNORMAL HIGH (ref 0.44–1.00)
GFR calc Af Amer: 19 mL/min — ABNORMAL LOW (ref >60)
GFR calc non Af Amer: 17 mL/min — ABNORMAL LOW (ref >60)
GLUCOSE: 246 mg/dL — AB (ref 65–99)
Potassium: 3.5 mmol/L (ref 3.5–5.1)
SODIUM: 135 mmol/L (ref 135–145)
Total Bilirubin: 0.7 mg/dL (ref 0.3–1.2)
Total Protein: 7 g/dL (ref 6.5–8.1)

## 2017-12-27 LAB — CBG MONITORING, ED
Glucose-Capillary: 236 mg/dL — ABNORMAL HIGH (ref 65–99)
Glucose-Capillary: 101 mg/dL — ABNORMAL HIGH (ref 65–99)

## 2017-12-27 LAB — CBC
HCT: 39 % (ref 36.0–46.0)
HEMOGLOBIN: 12.8 g/dL (ref 12.0–15.0)
MCH: 27.1 pg (ref 26.0–34.0)
MCHC: 32.8 g/dL (ref 30.0–36.0)
MCV: 82.5 fL (ref 78.0–100.0)
Platelets: 224 10*3/uL (ref 150–400)
RBC: 4.73 MIL/uL (ref 3.87–5.11)
RDW: 12.8 % (ref 11.5–15.5)
WBC: 7.6 10*3/uL (ref 4.0–10.5)

## 2017-12-27 LAB — DIFFERENTIAL
BASOS ABS: 0 10*3/uL (ref 0.0–0.1)
BASOS PCT: 0 %
Eosinophils Absolute: 0.2 10*3/uL (ref 0.0–0.7)
Eosinophils Relative: 2 %
LYMPHS ABS: 1.1 10*3/uL (ref 0.7–4.0)
Lymphocytes Relative: 14 %
Monocytes Absolute: 0.7 10*3/uL (ref 0.1–1.0)
Monocytes Relative: 9 %
NEUTROS PCT: 75 %
Neutro Abs: 5.6 10*3/uL (ref 1.7–7.7)

## 2017-12-27 LAB — I-STAT TROPONIN, ED: Troponin i, poc: 0 ng/mL (ref 0.00–0.08)

## 2017-12-27 LAB — APTT: APTT: 29 s (ref 24–36)

## 2017-12-27 LAB — PROTIME-INR
INR: 1.1
PROTHROMBIN TIME: 14.1 s (ref 11.4–15.2)

## 2017-12-27 LAB — GLUCOSE, CAPILLARY: Glucose-Capillary: 130 mg/dL — ABNORMAL HIGH (ref 65–99)

## 2017-12-27 MED ORDER — ADULT MULTIVITAMIN W/MINERALS CH
1.0000 | ORAL_TABLET | Freq: Every day | ORAL | Status: DC
  Administered 2017-12-27 – 2017-12-30 (×4): 1 via ORAL
  Filled 2017-12-27 (×4): qty 1

## 2017-12-27 MED ORDER — ESCITALOPRAM OXALATE 10 MG PO TABS
10.0000 mg | ORAL_TABLET | Freq: Every day | ORAL | Status: DC
  Administered 2017-12-28 – 2017-12-30 (×3): 10 mg via ORAL
  Filled 2017-12-27 (×5): qty 1

## 2017-12-27 MED ORDER — SODIUM CHLORIDE 0.9 % IV BOLUS (SEPSIS)
1000.0000 mL | Freq: Once | INTRAVENOUS | Status: AC
  Administered 2017-12-27: 1000 mL via INTRAVENOUS

## 2017-12-27 MED ORDER — LEVOTHYROXINE SODIUM 25 MCG PO TABS
25.0000 ug | ORAL_TABLET | Freq: Every day | ORAL | Status: DC
  Administered 2017-12-28 – 2017-12-30 (×3): 25 ug via ORAL
  Filled 2017-12-27 (×3): qty 1

## 2017-12-27 MED ORDER — ISOSORBIDE MONONITRATE ER 60 MG PO TB24
30.0000 mg | ORAL_TABLET | Freq: Every day | ORAL | Status: DC
  Administered 2017-12-28 – 2017-12-30 (×3): 30 mg via ORAL
  Filled 2017-12-27 (×5): qty 1

## 2017-12-27 MED ORDER — INSULIN ASPART 100 UNIT/ML ~~LOC~~ SOLN
0.0000 [IU] | Freq: Three times a day (TID) | SUBCUTANEOUS | Status: DC
  Administered 2017-12-30: 2 [IU] via SUBCUTANEOUS

## 2017-12-27 MED ORDER — LINAGLIPTIN 5 MG PO TABS
5.0000 mg | ORAL_TABLET | Freq: Every day | ORAL | Status: DC
  Administered 2017-12-28 – 2017-12-29 (×2): 5 mg via ORAL
  Filled 2017-12-27 (×2): qty 1

## 2017-12-27 MED ORDER — ASPIRIN EC 81 MG PO TBEC
81.0000 mg | DELAYED_RELEASE_TABLET | Freq: Every day | ORAL | Status: DC
  Administered 2017-12-27 – 2017-12-30 (×4): 81 mg via ORAL
  Filled 2017-12-27 (×4): qty 1

## 2017-12-27 MED ORDER — CLOPIDOGREL BISULFATE 75 MG PO TABS
75.0000 mg | ORAL_TABLET | Freq: Every day | ORAL | Status: DC
  Administered 2017-12-28 – 2017-12-30 (×3): 75 mg via ORAL
  Filled 2017-12-27 (×3): qty 1

## 2017-12-27 MED ORDER — GABAPENTIN 400 MG PO CAPS
400.0000 mg | ORAL_CAPSULE | Freq: Two times a day (BID) | ORAL | Status: DC
  Administered 2017-12-27 – 2017-12-30 (×5): 400 mg via ORAL
  Filled 2017-12-27 (×6): qty 1

## 2017-12-27 MED ORDER — INSULIN ASPART 100 UNIT/ML ~~LOC~~ SOLN
0.0000 [IU] | Freq: Every day | SUBCUTANEOUS | Status: DC
  Administered 2017-12-29: 2 [IU] via SUBCUTANEOUS

## 2017-12-27 MED ORDER — TEMAZEPAM 15 MG PO CAPS
30.0000 mg | ORAL_CAPSULE | Freq: Every day | ORAL | Status: DC
  Administered 2017-12-27 – 2017-12-29 (×2): 30 mg via ORAL
  Filled 2017-12-27 (×2): qty 2

## 2017-12-27 MED ORDER — ATORVASTATIN CALCIUM 20 MG PO TABS
20.0000 mg | ORAL_TABLET | Freq: Every day | ORAL | Status: DC
  Filled 2017-12-27 (×2): qty 1

## 2017-12-27 MED ORDER — GLIMEPIRIDE 2 MG PO TABS
4.0000 mg | ORAL_TABLET | Freq: Two times a day (BID) | ORAL | Status: DC
  Filled 2017-12-27 (×4): qty 2

## 2017-12-27 MED ORDER — STROKE: EARLY STAGES OF RECOVERY BOOK
Freq: Once | Status: AC
  Administered 2017-12-27: 21:00:00
  Filled 2017-12-27 (×2): qty 1

## 2017-12-27 MED ORDER — SODIUM CHLORIDE 0.9 % IV SOLN
INTRAVENOUS | Status: DC
  Administered 2017-12-27 – 2017-12-29 (×5): via INTRAVENOUS

## 2017-12-27 NOTE — Progress Notes (Signed)
CALL FROM RN 1402 EXAM STARTED 1404 EXAM FINISHED/SENT TO SOC 1406 CALLED GR [id number]  BEEPER [id number]

## 2017-12-27 NOTE — ED Notes (Signed)
Per teleneurologist cannot use TPA d/t unknown LKW time.

## 2017-12-27 NOTE — Consult Note (Signed)
   [facility] Tele[facility]urology Consult Services  Impression:  Patient with slurred speech, right lower facial weakness and some right leg weakness.  Likely ischemia of the left frontal subcortical region.     Not a tpa candidate due to: out of window Not an NIR candidate due to: presentation not suggestive of LVO   Differential Diagnosis:   1. Cardioembolic stroke  2. Small vessel disease/lacune  3. Thromboembolic, artery-to-artery mechanism  4. Hypercoagulable state-related infarct  5. Transient ischemic attack  6. Thrombotic mechanism, large artery disease   Comments:   Door time: 1350 [facility] contacted: 1408 [facility] at bedside: 1413 NIHSS assessment time: 1417  Recommendations:  ASA MRI brain wo inpatient neurology consultation Inpatient stroke evaluation as per Neurology/ Internal Medicine Discussed with ED MD  -----------------------------------------------------------------------------------------  CC: stroke alert  History of Present Illness  Patient is a 67 year old woman with a history of HTN, HLD, DM, prior stroke with residual LLE weakness and left homonymous hemianopia who presented with new stroke-like symptoms.  States she was up all night and dozed off around 0700 this AM.  She woke around 0900 and states that when she can last say she was definitively well - she has poor recollection of time this AM due to not keeping track of the time.  After that she started noting slurred speech, which inspired the ED visit.  ED staff noted right facial droop, too.  Diagnostic: CT head wo - nothing acute, chronic right PCA infarct and left parietal cortical infarct  Exam: NIHSS score: 1a LOC: 0  1b Questions: 0  1c Commands: 0  2 Gaze: 0 1 2 3  VF: 2 (chronic left homonymous hemianopia) 4 Face: 2 5a Motor arm left: 0  5b Motor arm right: 0 6a Motor leg left: 0  6b Motor leg right: 1 7 Ataxia: 0  8 Sensory: 1  9: Language: 0 10: Speech: 1 (left  face) 11: Extinction: 0       Medical Decision Making:  - Extensive number of diagnosis or management options are considered above.   - Extensive amount of complex data reviewed.   - High risk of complication and/or morbidity or mortality are associated with differential diagnostic considerations above.  - There may be Uncertain outcome and increased probability of prolonged functional impairment or high probability of severe prolonged functional impairment associated with some of these differential diagnosis.   Medical Data Reviewed:  1.Data reviewed include clinical labs, radiology,  Medical Tests;   2.Tests results discussed w/performing or interpreting physician;   3.Obtaining/reviewing old medical records;  4.Obtaining case history from another source;  5.Independent review of image, tracing or specimen.    Patient was informed the Neurology Consult would happen via telehealth (remote video) and consented to receiving care in this manner.

## 2017-12-27 NOTE — ED Notes (Signed)
AC to bring medications.

## 2017-12-27 NOTE — ED Notes (Signed)
Pt given meal tray at this time 

## 2017-12-27 NOTE — ED Notes (Signed)
Per pt friend, pt has bilateral visual deficiets and LLE weakness from previous stroke.

## 2017-12-27 NOTE — ED Provider Notes (Signed)
Emergency Department Provider Note   I have reviewed the triage vital signs and the nursing notes.   HISTORY  Chief Complaint Code Stroke   HPI Shannon [name] is a 67 y.o. female multiple medical problems as documented below that is a poor historian but presents the emergency department with strokelike symptoms.  Initially patient told me that she thought the symptoms started around 11 to 12:00 with dysarthria right-sided facial droop.  She been having 4-5 days of a viral gastroenteritis and intermittent dysphagia and dysarthria associated with that but went to bed normal last night and initially states she woke up this morning around 830 or 9:00 and she was normal then as well.  Her symptoms started sometime after that and then her friend came over to take her to the doctor noticed that she had a right facial droop and slurred speech sobrought her here for further evaluation. No other associated or modifying symptoms.    Past Medical History:  Diagnosis Date  . Anxiety   . Arthritis   . Coronary artery disease    a. s/p PCI with DES to LAD in 2011  (LHC 03/2010: Mid LAD 70%, normal ejection fraction  >> PCI:  2.75 x 15 mm Promus DES to LAD); b. Nuclear Stress Test 05/29/14: No ischemia, EF 59%, Normal; c. NSTEMI 11/2014 >> patient declined LHC b/c of recent CVA  . Depression   . Diabetes mellitus without complication (HCC)   . Dyslipidemia 11/20/2014  . Fibromyalgia 1995  . H/O ischemic right PCA stroke 10/2014   a. Carotid US 10/24/14: No significant ICA stenosis bilaterally  . Hemiparesis and alteration of sensations as late effects of stroke (HCC) 06/05/2015  . Hx of echocardiogram    a. Echocardiogram 10/25/14: Moderate LVH, EF 50-55%, normal wall motion, grade 2 diastolic dysfunction, mild LAE, atrial septal lipomatous hypertrophy  . Hypertension   . Irritable bowel     Patient Active Problem List   Diagnosis Date Noted  . CVA (cerebral vascular accident) (HCC) 12/27/2017    . Malnutrition of moderate degree 06/09/2017  . CVA (cerebrovascular accident) (HCC) 04/10/2017  . Dizziness 04/09/2017  . Hemiparesis and alteration of sensations as late effects of stroke (HCC) 06/05/2015  . Numbness 12/27/2014  . Visual disturbance 11/26/2014  . Dyslipidemia 11/20/2014  . Hypothyroidism 11/20/2014  . Pain   . Cerebral thrombosis with cerebral infarction (HCC) 11/19/2014  . Weakness   . NSTEMI (non-ST elevated myocardial infarction) (HCC) 11/17/2014  . Acute CVA -Dec 2015 10/24/2014  . DM type 2 (diabetes mellitus, type 2) (HCC) 10/24/2014  . Accelerated hypertension 10/24/2014  . Atypical chest pain 05/16/2014  . CAD S/P LAD PCI July 2011 07/01/2013  . Hypertension, essential 07/01/2013  . Fibromyalgia 07/01/2013  . IBS (irritable bowel syndrome) 07/01/2013    Past Surgical History:  Procedure Laterality Date  . CHOLECYSTECTOMY    . CORONARY STENT PLACEMENT    . LOOP RECORDER INSERTION N/A 06/23/2017   Procedure: Loop Recorder Insertion;  Surgeon: Duke Salvia, MD;  Location: [facility] INVASIVE CV LAB;  Service: Cardiovascular;  Laterality: N/A;  . orif arm fracture Right    forearm  . TEE WITHOUT CARDIOVERSION N/A 04/12/2017   Procedure: TRANSESOPHAGEAL ECHOCARDIOGRAM (TEE) WITH PROPOFOL;  Surgeon: Jonelle Sidle, MD;  Location: [facility] ENDO SUITE;  Service: Cardiovascular;  Laterality: N/A;    Current Outpatient Rx  . Order #: [id number] Class: Historical Med  . Order #: [id number] Class: OTC  . Order #: [id number] Class: Historical Med  .  Order #: [id number] Class: Historical Med  . Order #: [id number] Class: Historical Med  . Order #: [id number] Class: Normal  . Order #: [id number] Class: Historical Med  . Order #: [id number] Class: Historical Med  . Order #: [id number] Class: Historical Med  . Order #: [id number] Class: Historical Med  . Order #: [id number] Class: Historical Med  . Order #: [id number] Class: Print  . Order #: [id number] Class: Historical Med  . Order #:  [id number] Class: Normal  . Order #: [id number] Class: Normal  . Order #: [id number] Class: Historical Med  . Order #: [id number] Class: Print  . Order #: [id number] Class: Historical Med  . Order #: [id number] Class: Print  . Order #: [id number] Class: Historical Med    Allergies Insulins; Sulfa antibiotics; and Lisinopril  Family History  Problem Relation Age of Onset  . Cancer Mother   . Diabetes Mother   . Hypertension Mother   . Heart failure Mother   . CAD Mother   . Heart attack Mother   . Cancer Father   . Diabetes Father   . Hypertension Father   . Heart failure Father   . CAD Father   . Heart attack Father   . Stroke Father   . CAD Brother   . Diabetes Brother   . Hypertension Brother   . CAD Sister   . Diabetes Sister   . Hypertension Sister   . Heart attack Sister   . Heart attack Brother   . Stroke Maternal Aunt     Social History Social History   Tobacco Use  . Smoking status: Never Smoker  . Smokeless tobacco: Never Used  Substance Use Topics  . Alcohol use: No    Alcohol/week: 0.0 oz  . Drug use: No    Review of Systems  All other systems negative except as documented in the HPI. All pertinent positives and negatives as reviewed in the HPI. ____________________________________________   PHYSICAL EXAM:  VITAL SIGNS: ED Triage Vitals  Enc Vitals Group     BP 12/27/17 1354 (!) 156/64     Pulse Rate 12/27/17 1354 64     Resp 12/27/17 1400 16     Temp --      Temp src --      SpO2 12/27/17 1354 98 %    Constitutional: Alert and oriented. Well appearing and in no acute distress. Eyes: Conjunctivae are normal. PERRL. EOMI. Head: Atraumatic. Nose: No congestion/rhinnorhea. Mouth/Throat: Mucous membranes are moist.  Oropharynx non-erythematous. Neck: No stridor.  No meningeal signs.   Cardiovascular: Normal rate, regular rhythm. Good peripheral circulation. Grossly normal heart sounds.   Respiratory: Normal respiratory effort.  No retractions.  Lungs CTAB. Gastrointestinal: Soft and nontender. No distention.  Musculoskeletal: No lower extremity tenderness nor edema. No gross deformities of extremities. Neurologic:  Dysarthria, right sided facial droop sparing the forehead. Mild right arm weakness.  Skin:  Skin is warm, dry and intact. No rash noted.  ____________________________________________   LABS (all labs ordered are listed, but only abnormal results are displayed)  Labs Reviewed  COMPREHENSIVE METABOLIC PANEL - Abnormal; Notable for the following components:      Result Value   Chloride 98 (*)    Glucose, Bld 246 (*)    BUN 21 (*)    Creatinine, Ser 2.76 (*)    GFR calc non Af Amer 17 (*)    GFR calc Af Amer 19 (*)    All other components within normal limits  CBG MONITORING, ED - Abnormal; Notable for the following components:   Glucose-Capillary 236 (*)  All other components within normal limits  I-STAT CHEM 8, ED - Abnormal; Notable for the following components:   BUN 21 (*)    Creatinine, Ser 2.70 (*)    Glucose, Bld 257 (*)    Calcium, Ion 1.02 (*)    Hemoglobin 15.3 (*)    All other components within normal limits  PROTIME-INR  APTT  CBC  DIFFERENTIAL  URINALYSIS, ROUTINE W REFLEX MICROSCOPIC  I-STAT TROPONIN, ED   ____________________________________________  EKG   EKG Interpretation  Date/Time:  Monday December 27 2017 13:58:14 EST Ventricular Rate:  57 PR Interval:    QRS Duration: 100 QT Interval:  460 QTC Calculation: 448 R Axis:   55 Text Interpretation:  Sinus rhythm Borderline T abnormalities, anterior leads No significant change since last tracing Confirmed by Marily MemosMesner, Montie Gelardi [phone]([id number]) on 12/27/2017 3:10:51 PM       ____________________________________________  RADIOLOGY  Dg Chest 2 View  Result Date: 12/27/2017 CLINICAL DATA:  Speech disturbance beginning today. Possible stroke. EXAM: CHEST  2 VIEW COMPARISON:  10/14/2017 FINDINGS: Heart size is normal. Mediastinal shadows  are normal. The lungs are clear. The vascularity is normal. No effusions. Chronic bony degenerative changes noted affecting the spine. Loop recorder again visible. IMPRESSION: No active cardiopulmonary disease. Electronically Signed   By: Paulina FusiMark  Shogry M.D.   On: 12/27/2017 15:10   Ct Head Code Stroke Wo Contrast`  Result Date: 12/27/2017 CLINICAL DATA:  Code stroke. Focal neuro deficit less than 6 hours. Right facial droop, slurred speech EXAM: CT HEAD WITHOUT CONTRAST TECHNIQUE: Contiguous axial images were obtained from the base of the skull through the vertex without intravenous contrast. COMPARISON:  CT head 06/10/2017 FINDINGS: Brain: Negative for acute infarct.  Negative for hemorrhage or mass. Chronic right PCA infarct in the right occipital lobe unchanged. Chronic left parietal cortical infarct unchanged. Chronic infarcts in the white matter bilaterally. Mild atrophy. Vascular: Negative for acute vascular thrombosis Skull: Negative Sinuses/Orbits: Negative Other: None ASPECTS (Alberta Stroke Program Early CT Score) - Ganglionic level infarction (caudate, lentiform nuclei, internal capsule, insula, M1-M3 cortex): 7 - Supraganglionic infarction (M4-M6 cortex): 3 Total score (0-10 with 10 being normal): 10 IMPRESSION: 1. No acute abnormality 2. ASPECTS is 10 3. Extensive chronic ischemic changes stable from the prior study. 4. These results were called by telephone at the time of interpretation on 12/27/2017 at 2:17 pm to Dr. Marily MemosJASON Chezney Huether , who verbally acknowledged these results. Electronically Signed   By: Marlan Palauharles  Clark M.D.   On: 12/27/2017 14:17    ____________________________________________   PROCEDURES  Procedure(s) performed:   Procedures  CRITICAL CARE Performed by: Marily MemosMesner, Britni Driscoll Total critical care time: 35 minutes Critical care time was exclusive of separately billable procedures and treating other patients. Critical care was necessary to treat or prevent imminent or  life-threatening deterioration. Critical care was time spent personally by me on the following activities: development of treatment plan with patient and/or surrogate as well as nursing, discussions with consultants, evaluation of patient's response to treatment, examination of patient, obtaining history from patient or surrogate, ordering and performing treatments and interventions, ordering and review of laboratory studies, ordering and review of radiographic studies, pulse oximetry and re-evaluation of patient's condition.  ____________________________________________   INITIAL IMPRESSION / ASSESSMENT AND PLAN / ED COURSE  Code stroke initiated secondary to her report of symptoms start in approximately 3-1/2 hours prior to arrival, however on neurology consultation patient was less sure about this onset so TPA not given.  We will still admit  for stroke workup. Discussed with hospitalist and will admit for same.  Pertinent labs & imaging results that were available during my care of the patient were reviewed by me and considered in my medical decision making (see chart for details).  ____________________________________________  FINAL CLINICAL IMPRESSION(S) / ED DIAGNOSES  Final diagnoses:  CVA (cerebral vascular accident) (HCC)     MEDICATIONS GIVEN DURING THIS VISIT:  Medications   stroke: mapping our early stages of recovery book (not administered)  aspirin EC tablet 81 mg (not administered)  clopidogrel (PLAVIX) tablet 75 mg (not administered)  gabapentin (NEURONTIN) capsule 400 mg (not administered)  escitalopram (LEXAPRO) tablet 10 mg (not administered)  glimepiride (AMARYL) tablet 4 mg (not administered)  isosorbide mononitrate (IMDUR) 24 hr tablet 30 mg (not administered)  levothyroxine (SYNTHROID, LEVOTHROID) tablet 25 mcg (not administered)  linagliptin (TRADJENTA) tablet 5 mg (not administered)  temazepam (RESTORIL) capsule 30 mg (not administered)  multivitamin  capsule 1 capsule (not administered)  sodium chloride 0.9 % bolus 1,000 mL (1,000 mLs Intravenous New Bag/Given 12/27/17 1509)     NEW OUTPATIENT MEDICATIONS STARTED DURING THIS VISIT:  New Prescriptions   No medications on file    Note:  This note was prepared with assistance of Dragon voice recognition software. Occasional wrong-word or sound-a-like substitutions may have occurred due to the inherent limitations of voice recognition software.   Marily Memos, MD 12/27/17 [phone]

## 2017-12-27 NOTE — ED Triage Notes (Signed)
Facial droop right side, slurred speech on arrival to Ed. Pt states at first symptoms started 2-3 hours ago.

## 2017-12-27 NOTE — H&P (Signed)
History and Physical    ROSE-MARIE HICKLING ZOX:[id number] DOB: 11-21-1950 DOA: 12/27/2017    PCP: Nila Nephew, MD  Patient coming from: home  Chief Complaint: trouble speaking, facial droop  HPI: Shannon [name] is a 67 y.o. female with medical history of CAD s/p stenting, h/o multiple CVAs in the past on ASA and Plavix, DM2, HLD, HTN, presents for dysarthria and right facial droop which started sometime this morning but she is unable to tell me when. Her friend noticed it when she visited her today. The patient thinks her vision has been effected as well but cannot tell me exactly how. There is not double vision and no visual field defects. She thinks her vision is blurry. No other deficits noted by patient. She has not taken her aspirin in a few days but has been trying to take her Plavix and other home medications. Her friend tells me that she has had a "device in her heart"  Implanted to check for heart issues.   She states that for about 2 weeks now, she has been dealing with a stomach virus and has had symptoms of vomiting and diarrhea. She states that she has not vomited today but has barely eaten in the past 2 week. She is able to tolerate liquids. Stool was loose today. No abdominal pain. She also has IBS.   ED Course:  CT head negative for acute CVA  Review of Systems:  All other systems reviewed and apart from HPI, are negative.  Past Medical History:  Diagnosis Date  . Anxiety   . Arthritis   . Coronary artery disease    a. s/p PCI with DES to LAD in 2011  (LHC 03/2010: Mid LAD 70%, normal ejection fraction  >> PCI:  2.75 x 15 mm Promus DES to LAD); b. Nuclear Stress Test 05/29/14: No ischemia, EF 59%, Normal; c. NSTEMI 11/2014 >> patient declined LHC b/c of recent CVA  . Depression   . Diabetes mellitus without complication (HCC)   . Dyslipidemia 11/20/2014  . Fibromyalgia 1995  . H/O ischemic right PCA stroke 10/2014   a. Carotid US 10/24/14: No significant ICA stenosis bilaterally    . Hemiparesis and alteration of sensations as late effects of stroke (HCC) 06/05/2015  . Hx of echocardiogram    a. Echocardiogram 10/25/14: Moderate LVH, EF 50-55%, normal wall motion, grade 2 diastolic dysfunction, mild LAE, atrial septal lipomatous hypertrophy  . Hypertension   . Irritable bowel     Past Surgical History:  Procedure Laterality Date  . CHOLECYSTECTOMY    . CORONARY STENT PLACEMENT    . LOOP RECORDER INSERTION N/A 06/23/2017   Procedure: Loop Recorder Insertion;  Surgeon: Duke Salvia, MD;  Location: [facility] INVASIVE CV LAB;  Service: Cardiovascular;  Laterality: N/A;  . orif arm fracture Right    forearm  . TEE WITHOUT CARDIOVERSION N/A 04/12/2017   Procedure: TRANSESOPHAGEAL ECHOCARDIOGRAM (TEE) WITH PROPOFOL;  Surgeon: Jonelle Sidle, MD;  Location: [facility] ENDO SUITE;  Service: Cardiovascular;  Laterality: N/A;    Social History:   reports that  has never smoked. she has never used smokeless tobacco. She reports that she does not drink alcohol or use drugs.   Lives alone. Able to perform ADLs  Allergies  Allergen Reactions  . Insulins Other (See Comments)    Causes severe diarrhea  . Sulfa Antibiotics Other (See Comments)    Childhood allergy, can't remember  . Lisinopril Cough    Family History  Problem Relation  Age of Onset  . Cancer Mother   . Diabetes Mother   . Hypertension Mother   . Heart failure Mother   . CAD Mother   . Heart attack Mother   . Cancer Father   . Diabetes Father   . Hypertension Father   . Heart failure Father   . CAD Father   . Heart attack Father   . Stroke Father   . CAD Brother   . Diabetes Brother   . Hypertension Brother   . CAD Sister   . Diabetes Sister   . Hypertension Sister   . Heart attack Sister   . Heart attack Brother   . Stroke Maternal Aunt      Prior to Admission medications   Medication Sig Start Date End Date Taking? Authorizing Provider  amLODipine (NORVASC) 10 MG tablet Take 10 mg by mouth  daily. 09/11/16   [provider]  aspirin EC 81 MG tablet Take 1 tablet (81 mg total) by mouth daily. 11/27/14   Tereso Newcomer T, PA-C  b complex vitamins tablet Take 1 tablet by mouth daily.    [provider]  Cholecalciferol (VITAMIN D-3) 1000 UNITS CAPS Take 1,000 Units by mouth daily.     [provider]  Chromium 200 MCG CAPS Take 200 mcg by mouth daily.     [provider]  clopidogrel (PLAVIX) 75 MG tablet TAKE ONE TABLET ONCE DAILY WITH BREAKFAST 08/13/17   Jake Bathe, MD  co-enzyme Q-10 30 MG capsule Take 30 mg by mouth daily.    [provider]  escitalopram (LEXAPRO) 10 MG tablet Take 1 tablet by mouth daily. 12/01/17   [provider]  gabapentin (NEURONTIN) 400 MG capsule Take 400 mg by mouth 2 (two) times daily. 07/29/16   [provider]  glimepiride (AMARYL) 4 MG tablet Take 4 mg by mouth 2 (two) times daily. 09/03/16   [provider]  hydrOXYzine (ATARAX/VISTARIL) 25 MG tablet Take 25 mg by mouth 2 (two) times daily as needed for anxiety.  05/27/17   [provider]  isosorbide mononitrate (IMDUR) 30 MG 24 hr tablet Take 1 tablet (30 mg total) by mouth daily. DUE APPT IN AUGUST, MAKE APPT FOR FURTHER REFILLS, CALL [phone] TO SCHEDULE Patient taking differently: Take 30 mg by mouth daily.  06/12/17   Houston Siren, MD  JANUVIA 100 MG tablet Take 100 mg by mouth daily. 09/11/16   [provider]  levETIRAcetam (KEPPRA) 250 MG tablet Take 1 tablet (250 mg total) by mouth 2 (two) times daily. Patient not taking: Reported on 06/23/2017 06/12/17   Houston Siren, MD  levothyroxine (SYNTHROID, LEVOTHROID) 25 MCG tablet TAKE 1 TABLET (25 MCG TOTAL) BY MOUTH DAILY BEFORE BREAKFAST. 07/30/15   Jake Bathe, MD  losartan (COZAAR) 100 MG tablet Take 100 mg by mouth daily. 07/29/16   [provider]  metoprolol tartrate (LOPRESSOR) 25 MG tablet Take 1 tablet (25 mg total) by mouth 2 (two) times daily.  06/12/17   Houston Siren, MD  Multiple Vitamin (MULTIVITAMIN) capsule Take 1 capsule by mouth daily. Senior vitamin    [provider]  nitroGLYCERIN (NITROSTAT) 0.4 MG SL tablet Place 1 tablet (0.4 mg total) under the tongue every 5 (five) minutes as needed. Patient taking differently: Place 0.4 mg under the tongue every 5 (five) minutes as needed for chest pain.  06/12/17   Houston Siren, MD  temazepam (RESTORIL) 15 MG capsule Take 30 mg by mouth at  bedtime.     [provider]  isosorbide dinitrate (ISORDIL) 30 MG tablet Take 1 tablet (30 mg total) by mouth every morning. 03/14/14 07/13/14  Ambrose Finland, NP    Physical Exam: Wt Readings from Last 3 Encounters:  10/14/17 69.9 kg (154 lb)  06/23/17 69.9 kg (154 lb)  06/09/17 64.1 kg (141 lb 5 oz)   Vitals:   12/27/17 1354 12/27/17 1400 12/27/17 1420 12/27/17 1515  BP: (!) 156/64 (!) 142/97 (!) 136/57 (!) 125/49  Pulse: 64 (!) 57 (!) 55 60  Resp:  16 14 15   SpO2: 98% 100% 100% 95%      Constitutional: NAD, calm, comfortable Eyes: PERTLA, lids and conjunctivae normal ENMT: Mucous membranes are very dry. Posterior pharynx clear of any exudate or lesions. Normal dentition.  Neck: normal, supple, no masses, no thyromegaly Respiratory: clear to auscultation bilaterally, no wheezing, no crackles. Normal respiratory effort. No accessory muscle use.  Cardiovascular: S1 & S2 heard, regular rate and rhythm, no murmurs / rubs / gallops. No extremity edema. 2+ pedal pulses. No carotid bruits.  Abdomen: No distension, no tenderness, no masses palpated. No hepatosplenomegaly. Bowel sounds normal.  Musculoskeletal: no clubbing / cyanosis. No joint deformity upper and lower extremities. Good ROM, no contractures. Normal muscle tone.  Skin: no rashes, lesions, ulcers. No induration Neurologic: right facial droop and difficulty speaking, other cranial nerves are intact. Strength intact in extermities  Psychiatric: Normal judgment and insight.  Alert and oriented x 3. Normal mood.     Labs on Admission: I have personally reviewed following labs and imaging studies  CBC: Recent Labs  Lab 12/27/17 1403 12/27/17 1414  WBC  --  7.6  NEUTROABS  --  5.6  HGB 15.3* 12.8  HCT 45.0 39.0  MCV  --  82.5  PLT  --  224   Basic Metabolic Panel: Recent Labs  Lab 12/27/17 1403 12/27/17 1414  NA 136 135  K 4.1 3.5  CL 102 98*  CO2  --  24  GLUCOSE 257* 246*  BUN 21* 21*  CREATININE 2.70* 2.76*  CALCIUM  --  9.3   GFR: CrCl cannot be calculated (Unknown ideal weight.). Liver Function Tests: Recent Labs  Lab 12/27/17 1414  AST 21  ALT 16  ALKPHOS 86  BILITOT 0.7  PROT 7.0  ALBUMIN 3.7   No results for input(s): LIPASE, AMYLASE in the last 168 hours. No results for input(s): AMMONIA in the last 168 hours. Coagulation Profile: Recent Labs  Lab 12/27/17 1414  INR 1.10   Cardiac Enzymes: No results for input(s): CKTOTAL, CKMB, CKMBINDEX, TROPONINI in the last 168 hours. BNP (last 3 results) No results for input(s): PROBNP in the last 8760 hours. HbA1C: No results for input(s): HGBA1C in the last 72 hours. CBG: Recent Labs  Lab 12/27/17 1415  GLUCAP 236*   Lipid Profile: No results for input(s): CHOL, HDL, LDLCALC, TRIG, CHOLHDL, LDLDIRECT in the last 72 hours. Thyroid Function Tests: No results for input(s): TSH, T4TOTAL, FREET4, T3FREE, THYROIDAB in the last 72 hours. Anemia Panel: No results for input(s): VITAMINB12, FOLATE, FERRITIN, TIBC, IRON, RETICCTPCT in the last 72 hours. Urine analysis:    Component Value Date/Time   COLORURINE STRAW (A) 10/14/2017 1817   APPEARANCEUR CLEAR 10/14/2017 1817   LABSPEC 1.002 (L) 10/14/2017 1817   PHURINE 6.0 10/14/2017 1817   GLUCOSEU NEGATIVE 10/14/2017 1817   HGBUR NEGATIVE 10/14/2017 1817   BILIRUBINUR NEGATIVE 10/14/2017 1817   BILIRUBINUR neg 04/13/2014 1733  KETONESUR NEGATIVE 10/14/2017 1817   PROTEINUR NEGATIVE 10/14/2017 1817   UROBILINOGEN 0.2  04/13/2014 1733   NITRITE NEGATIVE 10/14/2017 1817   LEUKOCYTESUR NEGATIVE 10/14/2017 1817   Sepsis Labs: @LABRCNTIP (procalcitonin:4,lacticidven:4) )No results found for this or any previous visit (from the past 240 hour(s)).   Radiological Exams on Admission: Dg Chest 2 View  Result Date: 12/27/2017 CLINICAL DATA:  Speech disturbance beginning today. Possible stroke. EXAM: CHEST  2 VIEW COMPARISON:  10/14/2017 FINDINGS: Heart size is normal. Mediastinal shadows are normal. The lungs are clear. The vascularity is normal. No effusions. Chronic bony degenerative changes noted affecting the spine. Loop recorder again visible. IMPRESSION: No active cardiopulmonary disease. Electronically Signed   By: Paulina Fusi M.D.   On: 12/27/2017 15:10   Ct Head Code Stroke Wo Contrast`  Result Date: 12/27/2017 CLINICAL DATA:  Code stroke. Focal neuro deficit less than 6 hours. Right facial droop, slurred speech EXAM: CT HEAD WITHOUT CONTRAST TECHNIQUE: Contiguous axial images were obtained from the base of the skull through the vertex without intravenous contrast. COMPARISON:  CT head 06/10/2017 FINDINGS: Brain: Negative for acute infarct.  Negative for hemorrhage or mass. Chronic right PCA infarct in the right occipital lobe unchanged. Chronic left parietal cortical infarct unchanged. Chronic infarcts in the white matter bilaterally. Mild atrophy. Vascular: Negative for acute vascular thrombosis Skull: Negative Sinuses/Orbits: Negative Other: None ASPECTS (Alberta Stroke Program Early CT Score) - Ganglionic level infarction (caudate, lentiform nuclei, internal capsule, insula, M1-M3 cortex): 7 - Supraganglionic infarction (M4-M6 cortex): 3 Total score (0-10 with 10 being normal): 10 IMPRESSION: 1. No acute abnormality 2. ASPECTS is 10 3. Extensive chronic ischemic changes stable from the prior study. 4. These results were called by telephone at the time of interpretation on 12/27/2017 at 2:17 pm to Dr. Marily Memos  , who verbally acknowledged these results. Electronically Signed   By: Marlan Palau M.D.   On: 12/27/2017 14:17    EKG: Independently reviewed. Sinus bradycardia  Assessment/Plan Principal Problem:   CVA (cerebral vascular accident) - h/o multiple CVAs in the past and has a loop recorder - occurring after a GI illness- has not been taking Asprin and I am not sure if she was vomiting up her other medications - MRI, ECHO and Carotid duplex ordered - she will need long term speech therapy - await swallow screen - resume ASA and Plavix- of note, she is not on a statin - consult Neuro - hold antihypertensives for today  Active Problems: Recent gastroenteritis? - follow for further diarrhea  AKI - Cr in 10/2017 was 0.93 and is now 2.70 -  may be ATN related to recent gastroenteritis in setting of ARB use- hold Cozaar - will place on continuous IVF and check FeNa    CAD S/P LAD PCI July 2011 - as above, cont ASA, Plavix      Hypertension, essential -allow permissive HTN- hold Norvasc, Cozaar, Imdur and Metoprolol for now    IBS (irritable bowel syndrome) - follow for further diarrhea    DM type 2 (diabetes mellitus, type 2)  - resume Amaryl, Januvia (Tradjenta on formulary) - place on SSI- check A1c  Seizure disorder - cont Keppra  Hypothyroid - cont Synthroid      DVT prophylaxis:  Lovenox Code Status: full code Family Communication:   Disposition Plan: home in 1-2 days  Consults called: neurology  Admission status: inpatient    Calvert Cantor MD [facility] Pager: [email or web address] Password [email or web address] 7PM-7AM, please contact night-coverage  12/27/2017, 3:31 PM

## 2017-12-27 NOTE — ED Notes (Signed)
Report given to Brandy RN on 300 

## 2017-12-27 NOTE — ED Notes (Signed)
Pharmacy to bring medications.

## 2017-12-27 NOTE — ED Notes (Signed)
Will perform stroke swallow screen when pt returns from radiology.

## 2017-12-27 NOTE — ED Notes (Signed)
Unable to get labs with initial IV stick due to blood coming back in tubes slowly. Lab at bedside attempting to obtain blood sample at this time.

## 2017-12-28 ENCOUNTER — Inpatient Hospital Stay (HOSPITAL_COMMUNITY): Payer: Medicare HMO

## 2017-12-28 DIAGNOSIS — M25572 Pain in left ankle and joints of left foot: Secondary | ICD-10-CM | POA: Diagnosis present

## 2017-12-28 DIAGNOSIS — I361 Nonrheumatic tricuspid (valve) insufficiency: Secondary | ICD-10-CM

## 2017-12-28 LAB — ECHOCARDIOGRAM COMPLETE
CHL CUP MV DEC (S): 306
E/e' ratio: 15.42
EWDT: 306 ms
FS: 39 % (ref 28–44)
Height: 64 in
IV/PV OW: 0.94
LA ID, A-P, ES: 38 mm
LA vol index: 33 mL/m2
LA vol: 56.1 mL
LADIAMINDEX: 2.24 cm/m2
LAVOLA4C: 54.9 mL
LDCA: 2.84 cm2
LEFT ATRIUM END SYS DIAM: 38 mm
LV E/e' medial: 15.42
LV E/e'average: 15.42
LV PW d: 10.3 mm — AB (ref 0.6–1.1)
LV TDI E'LATERAL: 6.42
LV TDI E'MEDIAL: 5.87
LVELAT: 6.42 cm/s
LVOTD: 19 mm
MV Peak grad: 4 mmHg
MV pk A vel: 95.6 m/s
MVPKEVEL: 99 m/s
RV LATERAL S' VELOCITY: 14.3 cm/s
TAPSE: 23.3 mm
Weight: 2310.42 oz

## 2017-12-28 LAB — LIPID PANEL
Cholesterol: 174 mg/dL (ref 0–200)
HDL: 34 mg/dL — ABNORMAL LOW (ref >40)
LDL CALC: 115 mg/dL — AB (ref 0–99)
Total CHOL/HDL Ratio: 5.1 RATIO
Triglycerides: 127 mg/dL (ref <150)
VLDL: 25 mg/dL (ref 0–40)

## 2017-12-28 LAB — GLUCOSE, CAPILLARY
GLUCOSE-CAPILLARY: 161 mg/dL — AB (ref 65–99)
GLUCOSE-CAPILLARY: 166 mg/dL — AB (ref 65–99)
Glucose-Capillary: 101 mg/dL — ABNORMAL HIGH (ref 65–99)

## 2017-12-28 LAB — BASIC METABOLIC PANEL
Anion gap: 12 (ref 5–15)
BUN: 20 mg/dL (ref 6–20)
CHLORIDE: 107 mmol/L (ref 101–111)
CO2: 25 mmol/L (ref 22–32)
Calcium: 9 mg/dL (ref 8.9–10.3)
Creatinine, Ser: 2.02 mg/dL — ABNORMAL HIGH (ref 0.44–1.00)
GFR calc Af Amer: 28 mL/min — ABNORMAL LOW (ref >60)
GFR calc non Af Amer: 24 mL/min — ABNORMAL LOW (ref >60)
Glucose, Bld: 95 mg/dL (ref 65–99)
POTASSIUM: 3.5 mmol/L (ref 3.5–5.1)
SODIUM: 144 mmol/L (ref 135–145)

## 2017-12-28 LAB — URINALYSIS, ROUTINE W REFLEX MICROSCOPIC
BILIRUBIN URINE: NEGATIVE
Bacteria, UA: NONE SEEN
Glucose, UA: NEGATIVE mg/dL
Hgb urine dipstick: NEGATIVE
KETONES UR: NEGATIVE mg/dL
NITRITE: NEGATIVE
PH: 6 (ref 5.0–8.0)
Protein, ur: NEGATIVE mg/dL
Specific Gravity, Urine: 1.003 — ABNORMAL LOW (ref 1.005–1.030)

## 2017-12-28 LAB — HEMOGLOBIN A1C
HEMOGLOBIN A1C: 6.7 % — AB (ref 4.8–5.6)
Mean Plasma Glucose: 145.59 mg/dL

## 2017-12-28 LAB — SODIUM, URINE, RANDOM: SODIUM UR: 53 mmol/L

## 2017-12-28 LAB — CREATININE, URINE, RANDOM: CREATININE, URINE: 31.86 mg/dL

## 2017-12-28 MED ORDER — ACETAMINOPHEN 325 MG PO TABS
650.0000 mg | ORAL_TABLET | ORAL | Status: DC | PRN
  Administered 2017-12-28 – 2017-12-29 (×2): 650 mg via ORAL
  Filled 2017-12-28 (×2): qty 2

## 2017-12-28 MED ORDER — ATORVASTATIN CALCIUM 40 MG PO TABS
40.0000 mg | ORAL_TABLET | Freq: Every day | ORAL | Status: DC
  Administered 2017-12-28 – 2017-12-29 (×2): 40 mg via ORAL
  Filled 2017-12-28 (×3): qty 1

## 2017-12-28 MED ORDER — LORAZEPAM 2 MG/ML IJ SOLN
1.0000 mg | Freq: Once | INTRAMUSCULAR | Status: AC | PRN
  Administered 2017-12-28: 1 mg via INTRAVENOUS
  Filled 2017-12-28: qty 1

## 2017-12-28 MED ORDER — HYDROCODONE-ACETAMINOPHEN 5-325 MG PO TABS: 1.0000 | ORAL_TABLET | Freq: Four times a day (QID) | ORAL | Status: DC | PRN

## 2017-12-28 NOTE — Evaluation (Addendum)
Occupational Therapy Evaluation Patient Details Name: Shannon [name] MRN: [id number] DOB: 01-14-1951 Today's Date: 12/28/2017    History of Present Illness Shannon [name] is a 67 y.o. female with medical history of CAD s/p stenting, h/o multiple CVAs in the past on ASA and Plavix, DM2, HLD, HTN, presents for dysarthria and right facial droop which started sometime this morning but she is unable to tell me when. Her friend noticed it when she visited her today. The patient thinks her vision has been effected as well but cannot tell me exactly how. There is not double vision and no visual field defects. She thinks her vision is blurry. No other deficits noted by patient. She has not taken her aspirin in a few days but has been trying to take her Plavix and other home medications.   Clinical Impression   Pt received supine in bed, agreeable to OT evaluation. Pt performing ADLs with supervision/min guard during standing tasks, assistance required for LB dressing. Pt using RW during evaluation due to feeling lightheaded and right side weakness, uses a quad cane at baseline. Pt demonstrates RUE strength at 4-/5 with decreased grip strength compared to non-dominant LUE. Pt would benefit from SNF on discharge to improve safety and independence in B/ADLs. Will continue to follow while in acute care.     Follow Up Recommendations  SNF   Equipment Recommendations  None recommended by OT       Precautions / Restrictions Precautions Precautions: Fall Restrictions Weight Bearing Restrictions: No      Mobility Bed Mobility Overal bed mobility: Modified Independent             General bed mobility comments: Increased time to push up into sitting and cuing for scooting to EOB  Transfers Overall transfer level: Needs assistance Equipment used: Rolling walker (2 wheeled) Transfers: Sit to/from Stand Sit to Stand: Min guard         General transfer comment: Increased time and cuing for hand  placement to push up from bed        ADL either performed or assessed with clinical judgement   ADL Overall ADL's : Needs assistance/impaired Eating/Feeding: Set up;Sitting Eating/Feeding Details (indicate cue type and reason): Requiring assistance for opening containers and packages Grooming: Wash/dry hands;Supervision/safety;Standing Grooming Details (indicate cue type and reason): Pt able to use RUE to reach for soap and paper towels without difficulty             Lower Body Dressing: Moderate assistance;Sitting/lateral leans Lower Body Dressing Details (indicate cue type and reason): Pt able to donn socks on LLE, requiring assistance for RLE as she is unable to hold RLE on knee while donning sock             Functional mobility during ADLs: Supervision/safety;Min guard;Rolling walker       Vision Baseline Vision/History: Wears glasses Wears Glasses: At all times Patient Visual Report: No change from baseline Vision Assessment?: Yes Eye Alignment: Within Functional Limits Ocular Range of Motion: Within Functional Limits Alignment/Gaze Preference: Within Defined Limits Tracking/Visual Pursuits: Able to track stimulus in all quads without difficulty Saccades: Within functional limits Convergence: Within functional limits Visual Fields: No apparent deficits            Pertinent Vitals/Pain Pain Assessment: 0-10 Pain Score: 3  Pain Location: back Pain Descriptors / Indicators: Aching;Constant Pain Intervention(s): Limited activity within patient's tolerance;Monitored during session;Repositioned     Hand Dominance Right   Extremity/Trunk Assessment Upper Extremity Assessment Upper Extremity  Assessment: RUE deficits/detail RUE Deficits / Details: RUE strength grossly 4-/5, slightly decreased grip strength   Lower Extremity Assessment Lower Extremity Assessment: Defer to PT evaluation   Cervical / Trunk Assessment Cervical / Trunk Assessment: Normal    Communication Communication Communication: Expressive difficulties(slightly slurred speech)   Cognition Arousal/Alertness: Awake/alert Behavior During Therapy: WFL for tasks assessed/performed Overall Cognitive Status: Within Functional Limits for tasks assessed                                                Home Living Family/patient expects to be discharged to:: Private residence Living Arrangements: Alone Available Help at Discharge: Family;Available PRN/intermittently Type of Home: Mobile home Home Access: Stairs to enter Entrance Stairs-Number of Steps: 5-6 Entrance Stairs-Rails: Left;Right;Can reach both Home Layout: One level     Bathroom Shower/Tub: Producer, television/film/videoWalk-in shower   Bathroom Toilet: Standard     Home Equipment: IT sales professionalCane - quad;Walker - 2 wheels;Shower seat;Bedside commode          Prior Functioning/Environment Level of Independence: Independent with assistive device(s)        Comments: Pt reports independence with ADLs        OT Problem List: Decreased strength;Decreased activity tolerance;Impaired balance (sitting and/or standing);Decreased safety awareness;Decreased knowledge of use of DME or AE      OT Treatment/Interventions: Self-care/ADL training;Therapeutic exercise;Neuromuscular education;Therapeutic activities;Patient/family education    OT Goals(Current goals can be found in the care plan section) Acute Rehab OT Goals Patient Stated Goal: To go home  OT Goal Formulation: With patient Time For Goal Achievement: 01/11/18 Potential to Achieve Goals: Good  OT Frequency: Min 2X/week                 End of Session Equipment Utilized During Treatment: Gait belt;Rolling walker  Activity Tolerance: Patient tolerated treatment well Patient left: in chair;with call bell/phone within reach;with chair alarm set  OT Visit Diagnosis: Muscle weakness (generalized) (M62.81)                Time: [id number] OT Time Calculation (min): 42  min Charges:  OT General Charges $OT Visit: 1 Visit OT Evaluation $OT Eval Low Complexity: 1 Low    Ezra SitesLeslie Lincon Sahlin, OTR/L  [phone] 12/28/2017, 8:55 AM

## 2017-12-28 NOTE — Plan of Care (Signed)
  No Outcome Acute Rehab OT Goals (only OT should resolve) Pt. Will Perform Grooming 12/28/2017 0858 by Geralynn Ochsroxler, Evalene Vath A, OT Flowsheets Taken 12/28/2017 0858  Pt Will Perform Grooming with modified independence;standing Pt. Will Perform Lower Body Dressing 12/28/2017 0858 by Geralynn Ochsroxler, Dontavian Marchi A, OT Flowsheets Taken 12/28/2017 0858  Pt Will Perform Lower Body Dressing with modified independence;sitting/lateral leans;sit to/from stand Pt. Will Transfer To Toilet 12/28/2017 0858 by Geralynn Ochsroxler, Ikey Omary A, OT Flowsheets Taken 12/28/2017 0858  Pt Will Transfer to Toilet with modified independence;ambulating;regular height toilet;bedside commode Pt. Will Perform Toileting-Clothing Manipulation 12/28/2017 0858 by Geralynn Ochsroxler, Dezyrae Kensinger A, OT Flowsheets Taken 12/28/2017 0858  Pt Will Perform Toileting - Clothing Manipulation and hygiene with modified independence;sitting/lateral leans;sit to/from stand Pt/Caregiver Will Perform Home Exercise Program 12/28/2017 0858 by Geralynn Ochsroxler, Gratia Disla A, OT Flowsheets Taken 12/28/2017 [phone]  Pt/caregiver will Perform Home Exercise Program Increased strength;Right Upper extremity;Independently;With written HEP provided

## 2017-12-28 NOTE — Consult Note (Signed)
[place] A. Shannon Laughter, MD     [email or web address]          Shannon [name] is an 67 y.o. female.   ASSESSMENT/PLAN: 1. Multiple recurrent cortical small infarcts involving the left hemisphere likely due to occlusion of the left MCA intracranially: Dual antiplatelet agents again is recommended for the patient along with statin.  2. 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 She presented with the acute onset of dysarthria and facial weakness on the right side. Patient tells me that she probably stopped taking her aspirin about 2 weeks ago for unclear reasons. She also was to be on a statin medication but apparently has not been taking this. She reports that she has been taking the Plavix however. The patient was not considered for interventional treatment because of the time of presentation. Her symptoms persist and have not improved. She tells me that she's had some issues falling over the last 2 weeks. She apparently twisted the left ankle and sprained it. The review systems is otherwise negative.    GENERAL: She is in no acute distress  HEENT: Normal  ABDOMEN: soft  EXTREMITIES: No edema; there is significant varicosities involving the distal legs bilaterally and the moderate pain involving the left ankle   BACK: Normal  SKIN: Normal by inspection.    MENTAL STATUS: Alert and oriented. Speech, language and cognition are generally intact. Judgment and insight normal.   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  MOTOR: Right deltoid is 4+. There is mild impairment of hand dexterity and fine finger movements on the right. Left upper extremity is 5. There is a mild pronation of the right upper extremity but no downward drift. There is no drift of the other extremities. The legs are graded as 5/5.  COORDINATION: Left finger to nose is normal, right finger to nose is normal, No rest tremor; no intention tremor; no postural tremor; no bradykinesia.  REFLEXES: Deep tendon reflexes are symmetrical and normal. Plantar reflexes are equivocal bilaterally.   SENSATION: Normal to light touch, temperature, and pain.      [[[[[[[NEURO NOTE 06-2017 1. The semiology of the patient's symptoms are rather ill-defined and nonspecific. However, imaging shows evidence of another embolic stroke in the setting of previous embolic strokes. Given the severe multivessel intracranial occlusive disease seen on the CTA, I think this is the most likely etiology. Cardioembolic is still a possibility but less likely. I would continue with dual antiplatelet agents and the use of statin medication. Loop recorder however still should be placed to evaluate for underlying atrial fibrillation.    2. 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   3. 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   4. Vitamin B12 deficiency. This should be replaced we'll give her an additional dose during this hospitalization.    1. The semiology of the patient's symptoms are rather ill-defined and nonspecific. However, imaging shows evidence of another embolic stroke in the setting of previous embolic strokes. This raises the  concern for cardioembolic phenomena. She has a loop recorder that is going to be placed in 2 weeks. In the meantime, I think we'll continue with the dual antiplatelet agents. Additional labs will  also be obtained for the following: RPR, C-reactive protein, ANA, homocysteine level and vitamin B12 level. An EEG will also be obtained. Brain CTA to evaluate for intracranial occlusive disease will also be obtained.   The patient is a 67 year old white female who presents with rather ill-defined symptoms of feeling dizzy and lightheaded and a sensation as if she's going to pass out or fall to the ground. She apparently has been having this for the last day or 2 but he got really worse today and she decided see medical attention. She reports feeling really fatigued and tired the last few days. She also reports sensation of  being tremulous on the inside and also the outside. She denies any loss of consciousness. She denies focal numbness, weakness, chest pain or shortness of breath. She tells me she has had a few strokes in the past. Her first event in 2015 left ear with impaired vision on the left. She reports that with each successive events she has had loss of different function such as difficulty writing, memory loss and dysarthria. She has a history of stents but denies any chest pain.]]]]]]]     Blood pressure (!) 130/49, pulse (!) 56, temperature 97.9 F (36.6 C), temperature source Oral, resp. rate 20, height '5\' 4"'  (1.626 m), weight 144 lb 6.4 oz (65.5 kg), SpO2 99 %.  Past Medical History:  Diagnosis Date  . Anxiety   . Arthritis   . Coronary artery disease    a. s/p PCI with DES to LAD in 2011  ([facility] 03/2010: Mid LAD 70%, normal ejection fraction  >> PCI:  2.75 x 15 mm Promus DES to LAD); b. Nuclear Stress Test 05/29/14: No ischemia, EF 59%, Normal; c. NSTEMI 11/2014 >> patient declined LHC b/c of recent CVA  . Depression   . Diabetes mellitus without complication ([place])   . Dyslipidemia 11/20/2014  . Fibromyalgia 1995  . H/O ischemic right PCA stroke 10/2014   a. Carotid US 10/24/14: No significant ICA stenosis bilaterally  . Hemiparesis and alteration of sensations as late effects of  stroke ([place]) 06/05/2015  . Hx of echocardiogram    a. Echocardiogram 10/25/14: Moderate LVH, EF 50-55%, normal wall motion, grade 2 diastolic dysfunction, mild LAE, atrial septal lipomatous hypertrophy  . Hypertension   . Irritable bowel     Past Surgical History:  Procedure Laterality Date  . CHOLECYSTECTOMY    . CORONARY STENT PLACEMENT    . LOOP RECORDER INSERTION N/A 06/23/2017   Procedure: Loop Recorder Insertion;  Surgeon: Deboraha Sprang, MD;  Location: [facility];  Service: Cardiovascular;  Laterality: N/A;  . orif arm fracture Right    forearm  . TEE WITHOUT CARDIOVERSION N/A 04/12/2017   Procedure: TRANSESOPHAGEAL ECHOCARDIOGRAM (TEE) WITH PROPOFOL;  Surgeon: Satira Sark, MD;  Location: [facility];  Service: Cardiovascular;  Laterality: N/A;    Family History  Problem Relation Age of Onset  . Cancer Mother   . Diabetes Mother   . Hypertension Mother   . Heart failure Mother   . CAD Mother   . Heart attack Mother   . Cancer Father   . Diabetes Father   . Hypertension Father   . Heart failure Father   . CAD Father   . Heart attack Father   .  Stroke Father   . CAD Brother   . Diabetes Brother   . Hypertension Brother   . CAD Sister   . Diabetes Sister   . Hypertension Sister   . Heart attack Sister   . Heart attack Brother   . Stroke Maternal Aunt     Social History:  reports that  has never smoked. she has never used smokeless tobacco. She reports that she does not drink alcohol or use drugs.  Allergies:  Allergies  Allergen Reactions  . Insulins Other (See Comments)    Causes severe diarrhea  . Sulfa Antibiotics Other (See Comments)    Childhood allergy, can't remember  . Lisinopril Cough    Medications: Prior to Admission medications   Medication Sig Start Date End Date Taking? Authorizing Provider  acetaminophen (TYLENOL) 500 MG tablet Take 500 mg by mouth daily as needed for mild pain.   Yes [provider]  amLODipine  (NORVASC) 10 MG tablet Take 10 mg by mouth daily. 09/11/16  Yes [provider]  aspirin EC 81 MG tablet Take 1 tablet (81 mg total) by mouth daily. 11/27/14  Yes Weaver, Scott T, PA-C  b complex vitamins tablet Take 1 tablet by mouth daily.   Yes [provider]  Cholecalciferol (VITAMIN D-3) 1000 UNITS CAPS Take 1,000 Units by mouth daily.    Yes [provider]  Chromium 200 MCG CAPS Take 200 mcg by mouth every 30 (thirty) days.    Yes [provider]  clopidogrel (PLAVIX) 75 MG tablet TAKE ONE TABLET ONCE DAILY WITH BREAKFAST 08/13/17  Yes Jerline Pain, MD  co-enzyme Q-10 30 MG capsule Take 30 mg by mouth daily.   Yes [provider]  escitalopram (LEXAPRO) 10 MG tablet Take 1 tablet by mouth daily. 12/01/17  Yes [provider]  gabapentin (NEURONTIN) 400 MG capsule Take 400 mg by mouth 2 (two) times daily. 07/29/16  Yes [provider]  glimepiride (AMARYL) 4 MG tablet Take 4 mg by mouth 2 (two) times daily. 09/03/16  Yes [provider]  hydrOXYzine (ATARAX/VISTARIL) 25 MG tablet Take 25 mg by mouth 2 (two) times daily as needed for anxiety.  05/27/17  Yes [provider]  ibuprofen (ADVIL,MOTRIN) 200 MG tablet Take 200 mg by mouth daily as needed for mild pain or moderate pain.   Yes [provider]  isosorbide mononitrate (IMDUR) 30 MG 24 hr tablet Take 1 tablet (30 mg total) by mouth daily. DUE APPT IN AUGUST, MAKE APPT FOR FURTHER REFILLS, CALL [phone] TO SCHEDULE Patient taking differently: Take 30 mg by mouth daily.  06/12/17  Yes Orvan Falconer, MD  JANUVIA 100 MG tablet Take 100 mg by mouth daily. 09/11/16  Yes [provider]  levothyroxine (SYNTHROID, LEVOTHROID) 25 MCG tablet TAKE 1 TABLET (25 MCG TOTAL) BY MOUTH DAILY BEFORE BREAKFAST. 07/30/15  Yes Jerline Pain, MD  losartan (COZAAR) 100 MG tablet Take 100 mg by mouth daily. 07/29/16  Yes [provider]  metoprolol tartrate  (LOPRESSOR) 25 MG tablet Take 1 tablet (25 mg total) by mouth 2 (two) times daily. Patient taking differently: Take 25-50 mg by mouth 2 (two) times daily. 47m in the morning and 240min the afternoon. 06/12/17  Yes LeOrvan FalconerMD  Multiple Vitamin (MULTIVITAMIN) capsule Take 1 capsule by mouth daily. Senior vitamin   Yes [provider]  nitroGLYCERIN (NITROSTAT) 0.4 MG SL tablet Place 1 tablet (0.4 mg total) under the tongue every 5 (five)  minutes as needed. Patient taking differently: Place 0.4 mg under the tongue every 5 (five) minutes as needed for chest pain.  06/12/17  Yes Orvan Falconer, MD  temazepam (RESTORIL) 30 MG capsule Take 30 mg by mouth at bedtime.    Yes [provider]  isosorbide dinitrate (ISORDIL) 30 MG tablet Take 1 tablet (30 mg total) by mouth every morning. 03/14/14 07/13/14  Lance Bosch, NP    Scheduled Meds: . aspirin EC  81 mg Oral Daily  . atorvastatin  40 mg Oral q1800  . clopidogrel  75 mg Oral Daily  . escitalopram  10 mg Oral Daily  . gabapentin  400 mg Oral BID  . insulin aspart  0-5 Units Subcutaneous QHS  . insulin aspart  0-9 Units Subcutaneous TID WC  . isosorbide mononitrate  30 mg Oral Daily  . levothyroxine  25 mcg Oral QAC breakfast  . linagliptin  5 mg Oral Daily  . multivitamin with minerals  1 tablet Oral Daily  . temazepam  30 mg Oral QHS   Continuous Infusions: . sodium chloride 50 mL/hr at 12/28/17 1801   PRN Meds:.acetaminophen, HYDROcodone-acetaminophen     Results for orders placed or performed during the hospital encounter of 12/27/17 (from the past 48 hour(s))  I-stat troponin, ED     Status: None   Collection Time: 12/27/17  2:02 PM  Result Value Ref Range   Troponin i, poc 0.00 0.00 - 0.08 ng/mL   Comment 3            Comment: Due to the release kinetics of cTnI, a negative result within the first hours of the onset of symptoms does not rule out myocardial infarction with certainty. If myocardial infarction is  still suspected, repeat the test at appropriate intervals.   I-Stat Chem 8, ED     Status: Abnormal   Collection Time: 12/27/17  2:03 PM  Result Value Ref Range   Sodium 136 135 - 145 mmol/L   Potassium 4.1 3.5 - 5.1 mmol/L   Chloride 102 101 - 111 mmol/L   BUN 21 (H) 6 - 20 mg/dL   Creatinine, Ser 2.70 (H) 0.44 - 1.00 mg/dL   Glucose, Bld 257 (H) 65 - 99 mg/dL   Calcium, Ion 1.02 (L) 1.15 - 1.40 mmol/L   TCO2 22 22 - 32 mmol/L   Hemoglobin 15.3 (H) 12.0 - 15.0 g/dL   HCT 45.0 36.0 - 46.0 %  Protime-INR     Status: None   Collection Time: 12/27/17  2:14 PM  Result Value Ref Range   Prothrombin Time 14.1 11.4 - 15.2 seconds   INR 1.10     Comment: Performed at [facility], [address], [place], [place] [address]  APTT     Status: None   Collection Time: 12/27/17  2:14 PM  Result Value Ref Range   aPTT 29 24 - 36 seconds    Comment: Performed at [facility], [address] [place], [place] [address]  CBC     Status: None   Collection Time: 12/27/17  2:14 PM  Result Value Ref Range   WBC 7.6 4.0 - 10.5 K/uL   RBC 4.73 3.87 - 5.11 MIL/uL   Hemoglobin 12.8 12.0 - 15.0 g/dL   HCT 39.0 36.0 - 46.0 %   MCV 82.5 78.0 - 100.0 fL   MCH 27.1 26.0 - 34.0 pg   MCHC 32.8 30.0 - 36.0 g/dL   RDW 12.8 11.5 - 15.5 %  Platelets 224 150 - 400 K/uL    Comment: Performed at [facility], [address], [place], [place] [address]  Differential     Status: None   Collection Time: 12/27/17  2:14 PM  Result Value Ref Range   Neutrophils Relative % 75 %   Neutro Abs 5.6 1.7 - 7.7 K/uL   Lymphocytes Relative 14 %   Lymphs Abs 1.1 0.7 - 4.0 K/uL   Monocytes Relative 9 %   Monocytes Absolute 0.7 0.1 - 1.0 K/uL   Eosinophils Relative 2 %   Eosinophils Absolute 0.2 0.0 - 0.7 K/uL   Basophils Relative 0 %   Basophils Absolute 0.0 0.0 - 0.1 K/uL    Comment: Performed at [facility], [address] [place], [place] [address]  Comprehensive metabolic panel     Status: Abnormal   Collection  Time: 12/27/17  2:14 PM  Result Value Ref Range   Sodium 135 135 - 145 mmol/L   Potassium 3.5 3.5 - 5.1 mmol/L   Chloride 98 (L) 101 - 111 mmol/L   CO2 24 22 - 32 mmol/L   Glucose, Bld 246 (H) 65 - 99 mg/dL   BUN 21 (H) 6 - 20 mg/dL   Creatinine, Ser 2.76 (H) 0.44 - 1.00 mg/dL   Calcium 9.3 8.9 - 10.3 mg/dL   Total Protein 7.0 6.5 - 8.1 g/dL   Albumin 3.7 3.5 - 5.0 g/dL   AST 21 15 - 41 U/L   ALT 16 14 - 54 U/L   Alkaline Phosphatase 86 38 - 126 U/L   Total Bilirubin 0.7 0.3 - 1.2 mg/dL   GFR calc non Af Amer 17 (L) >60 mL/min   GFR calc Af Amer 19 (L) >60 mL/min    Comment: (NOTE) The eGFR has been calculated using the CKD EPI equation. This calculation has not been validated in all clinical situations. eGFR's persistently <60 mL/min signify possible Chronic Kidney Disease.    Anion gap 13 5 - 15    Comment: Performed at [facility], [address], [place], [place] [address]  CBG monitoring, ED     Status: Abnormal   Collection Time: 12/27/17  2:15 PM  Result Value Ref Range   Glucose-Capillary 236 (H) 65 - 99 mg/dL  Sodium, urine, random     Status: None   Collection Time: 12/27/17  4:18 PM  Result Value Ref Range   Sodium, Ur 53 mmol/L    Comment: Performed at [facility], [address], [place], [place] [address]  Creatinine, urine, random     Status: None   Collection Time: 12/27/17  4:18 PM  Result Value Ref Range   Creatinine, Urine 31.86 mg/dL    Comment: Performed at [facility], [address], [place], [place] [address]  CBG monitoring, ED     Status: Abnormal   Collection Time: 12/27/17  5:36 PM  Result Value Ref Range   Glucose-Capillary 101 (H) 65 - 99 mg/dL  Glucose, capillary     Status: Abnormal   Collection Time: 12/27/17  8:28 PM  Result Value Ref Range   Glucose-Capillary 130 (H) 65 - 99 mg/dL  Urinalysis, Routine w reflex microscopic     Status: Abnormal   Collection Time: 12/28/17  3:50 AM  Result Value Ref Range   Color, Urine STRAW (A)  YELLOW   APPearance CLEAR CLEAR   Specific Gravity, Urine 1.003 (L) 1.005 - 1.030   pH 6.0 5.0 - 8.0   Glucose, UA NEGATIVE  NEGATIVE mg/dL   Hgb urine dipstick NEGATIVE NEGATIVE   Bilirubin Urine NEGATIVE NEGATIVE   Ketones, ur NEGATIVE NEGATIVE mg/dL   Protein, ur NEGATIVE NEGATIVE mg/dL   Nitrite NEGATIVE NEGATIVE   Leukocytes, UA TRACE (A) NEGATIVE   RBC / HPF 0-5 0 - 5 RBC/hpf   WBC, UA 0-5 0 - 5 WBC/hpf   Bacteria, UA NONE SEEN NONE SEEN   Squamous Epithelial / LPF 0-5 (A) NONE SEEN    Comment: Performed at [facility], [address], [place], [place] [address]  Hemoglobin A1c     Status: Abnormal   Collection Time: 12/28/17  6:06 AM  Result Value Ref Range   Hgb A1c MFr Bld 6.7 (H) 4.8 - 5.6 %    Comment: (NOTE) Pre diabetes:          5.7%-6.4% Diabetes:              >6.4% Glycemic control for   <7.0% adults with diabetes    Mean Plasma Glucose 145.59 mg/dL    Comment: Performed at [facility] [address], [place], [place] [address]  Lipid panel     Status: Abnormal   Collection Time: 12/28/17  6:06 AM  Result Value Ref Range   Cholesterol 174 0 - 200 mg/dL   Triglycerides 127 <150 mg/dL   HDL 34 (L) >40 mg/dL   Total CHOL/HDL Ratio 5.1 RATIO   VLDL 25 0 - 40 mg/dL   LDL Cholesterol 115 (H) 0 - 99 mg/dL    Comment:        Total Cholesterol/HDL:CHD Risk Coronary Heart Disease Risk Table                     Men   Women  1/2 Average Risk   3.4   3.3  Average Risk       5.0   4.4  2 X Average Risk   9.6   7.1  3 X Average Risk  23.4   11.0        Use the calculated Patient Ratio above and the CHD Risk Table to determine the patient's CHD Risk.        ATP III CLASSIFICATION (LDL):  <100     mg/dL   Optimal  100-129  mg/dL   Near or Above                    Optimal  130-159  mg/dL   Borderline  160-189  mg/dL   High  >190     mg/dL   Very High Performed at [facility] [place], [place] [address]   Basic metabolic panel     Status:  Abnormal   Collection Time: 12/28/17  6:06 AM  Result Value Ref Range   Sodium 144 135 - 145 mmol/L    Comment: DELTA CHECK NOTED   Potassium 3.5 3.5 - 5.1 mmol/L   Chloride 107 101 - 111 mmol/L   CO2 25 22 - 32 mmol/L   Glucose, Bld 95 65 - 99 mg/dL   BUN 20 6 - 20 mg/dL   Creatinine, Ser 2.02 (H) 0.44 - 1.00 mg/dL   Calcium 9.0 8.9 - 10.3 mg/dL   GFR calc non Af Amer 24 (L) >60 mL/min   GFR calc Af Amer 28 (L) >60 mL/min    Comment: (NOTE) The eGFR has been calculated using the CKD EPI equation. This calculation has not been validated in  all clinical situations. eGFR's persistently <60 mL/min signify possible Chronic Kidney Disease.    Anion gap 12 5 - 15    Comment: Performed at [facility], [address], [place], [place] [address]  Glucose, capillary     Status: Abnormal   Collection Time: 12/28/17  8:04 AM  Result Value Ref Range   Glucose-Capillary 101 (H) 65 - 99 mg/dL   Comment 1 Notify RN    Comment 2 Document in Chart   Glucose, capillary     Status: Abnormal   Collection Time: 12/28/17 11:25 AM  Result Value Ref Range   Glucose-Capillary 161 (H) 65 - 99 mg/dL   Comment 1 Notify RN    Comment 2 Document in Chart   Glucose, capillary     Status: Abnormal   Collection Time: 12/28/17  4:28 PM  Result Value Ref Range   Glucose-Capillary 166 (H) 65 - 99 mg/dL   Comment 1 Notify RN    Comment 2 Document in Chart     Studies/Results:  HEAD CT FINE    CAROTID DOPPLERS Stable carotid artery duplex.  Again noted is a minimally elevated peak systolic velocity in the right internal carotid artery. Findings could represent a 50-69% stenosis in the right internal carotid artery, closer to 50%.  Again noted is focal plaque in the proximal left internal carotid artery. Estimated degree of stenosis in left internal carotid artery is less than 50%   BRAIN MRI MRA  FINDINGS: MRI HEAD FINDINGS  Motion degradation on multiple sequences.  Brain: Several  small foci of reduced diffusion measuring up to 14 mm are scattered throughout the left lateral frontal lobe compatible with acute/early subacute infarction.  Chronic infarctions of right PCA distribution and scattered throughout left frontal and parietal lobes. Chronic lacunar infarcts are present in bilateral basal ganglia. Stable background of chronic microvascular ischemic changes and parenchymal volume loss of the brain. No gross hemorrhage. No hydrocephalus, effacement of basilar cisterns, or extra-axial collection.  Vascular: As below.  Skull and upper cervical spine: Normal marrow signal.  Sinuses/Orbits: Negative.  Other: None.  MRA HEAD FINDINGS  Severe motion artifact, suboptimal assessment for aneurysm or stenosis.  Internal carotid arteries: Patent bilaterally. The left is small in caliber.  Anterior cerebral arteries: Proximal left ACA is patent. Faint flow related signal and right A1, no appreciable downstream right ACA.  Middle cerebral arteries: Patent right. No appreciable signal in left MCA, possibly due to occlusion or secondary to high-grade stenosis in left M1 as seen on prior CTA.  Anterior communicating artery: Motion degraded.  Posterior communicating arteries:  Motion degraded.  Posterior cerebral arteries: Patent left PCA. Left P1 stenosis. No right PCA identified.  Basilar artery:  Patent.  Vertebral arteries:  Patent.  IMPRESSION: MRI head:  Motion degradation of multiple sequences.  1. Several small foci of acute/early subacute infarction within left MCA distribution. No gross hemorrhage or mass effect identified. 2. Stable background of extensive chronic microvascular ischemic changes, chronic infarcts, and volume loss.  MRA head:  1. Severe motion artifact. 2. No flow related signal in left MCA, possibly due to occlusion or secondary to high-grade left M1 stenosis as seen on prior CTA. If clinically  indicated, patency may be better assessed with CT angiogram. 3. Patent bilateral ICA, right MCA, and left ACA. 4. Faint flow related signal and right A1, no appreciable distal right ACA may be due to small caliber, occlusion, or proximal stenosis. 5. Chronic right PCA occlusion. 6. Left P1  stenosis.     The brain MRI MRA are reviewed in person. There are multiple small and tiny infarcts involving the left cortical areas. There is involvement of the parasagittal region in a watershed distribution. There are more moderate cortical lesions involving the left frontal region, left temporal region and left posterior frontal regions. These are all cortical based lesions associated with increased signal on DWI in reduced signal on ADC scan. There is evidence of old right PCA infarct with classic involvement of the calcarine cortex in the medial temporal region associated with increased signal on FLAIR and encephalomalacia. There is a smaller remote infarct involving the left occipital region with encephalomalacia and increased signal on FLAIR imaging. The MRA shows multiple intracranial disease although the studies degraded by motion artifact. There is drop lock signal involving the ACA bilaterally more in the right side. There is also drop of signal involving the posterior chronic condition artery and right PCA. The left MCA is not visualized.  Donella Pascarella A. Shannon [name], M.D.  Diplomate, Tax adviser of Psychiatry and Neurology ( Neurology). 12/28/2017, 6:44 PM

## 2017-12-28 NOTE — Progress Notes (Signed)
*  PRELIMINARY RESULTS* Echocardiogram 2D Echocardiogram has been performed.  Shannon [name], Shannon [name] 12/28/2017, 4:01 PM

## 2017-12-28 NOTE — Evaluation (Signed)
Physical Therapy Evaluation Patient Details Name: Shannon [name] MRN: [id number] DOB: 10/14/1951 Today's Date: 12/28/2017   History of Present Illness  Shannon [name] is a 67 y.o. female with medical history of CAD s/p stenting, h/o multiple CVAs in the past on ASA and Plavix, DM2, HLD, HTN, presents for dysarthria and right facial droop which started sometime this morning but she is unable to tell me when. Her friend noticed it when she visited her today. The patient thinks her vision has been effected as well but cannot tell me exactly how. There is not double vision and no visual field defects. She thinks her vision is blurry. No other deficits noted by patient. She has not taken her aspirin in a few days but has been trying to take her Plavix and other home medications.  Clinical Impression  Pt is up to sidestep bedside with quad cane at her request and did also note her L ankle edema and pain.  Will need RW for management of both R side weakness and pain from rolling L ankle in fall at the time of hemiparesis onset.  Pain is managed with meds but will recommend further therapy for soft tissue pain.  Follow acutely for strengthening and balance to increase control of transfers and gait.    Follow Up Recommendations SNF    Equipment Recommendations  Rolling walker with 5" wheels    Recommendations for Other Services       Precautions / Restrictions Precautions Precautions: Fall Restrictions Weight Bearing Restrictions: No      Mobility  Bed Mobility Overal bed mobility: Modified Independent             General bed mobility comments: using rail to assist sitting  Transfers Overall transfer level: Needs assistance Equipment used: Quad cane;1 person hand held assist Transfers: Sit to/from Stand Sit to Stand: Min assist;Mod assist         General transfer comment: Increased time and cuing for hand placement to push up from bed  Ambulation/Gait Ambulation/Gait assistance:  Min assist;Mod assist Ambulation Distance (Feet): 5 Feet Assistive device: 1 person hand held assist;Quad cane Gait Pattern/deviations: Step-to pattern;Decreased stride length;Wide base of support;Trunk flexed;Decreased weight shift to right Gait velocity: reduced Gait velocity interpretation: Below normal speed for age/gender General Gait Details: struggling due to weakness and insistence on using her cane   Stairs            Wheelchair Mobility    Modified Rankin (Stroke Patients Only) Modified Rankin (Stroke Patients Only) Pre-Morbid Rankin Score: Slight disability Modified Rankin: Moderate disability     Balance Overall balance assessment: Needs assistance;History of Falls Sitting-balance support: Feet supported;Single extremity supported Sitting balance-Leahy Scale: Fair   Postural control: Left lateral lean Standing balance support: Bilateral upper extremity supported Standing balance-Leahy Scale: Poor                               Pertinent Vitals/Pain Pain Assessment: 0-10 Pain Score: 4  Pain Location: L ankle Pain Descriptors / Indicators: Aching Pain Intervention(s): Limited activity within patient's tolerance;Monitored during session;Repositioned    Home Living Family/patient expects to be discharged to:: Private residence Living Arrangements: Alone Available Help at Discharge: Family;Available PRN/intermittently Type of Home: Mobile home Home Access: Stairs to enter Entrance Stairs-Rails: Left;Right;Can reach both Entrance Stairs-Number of Steps: 5-6 Home Layout: One level Home Equipment: Gilmer Morane - quad;Walker - 2 wheels;Shower seat;Bedside commode  Prior Function Level of Independence: Independent with assistive device(s)         Comments: Pt reports independence with ADLs     Hand Dominance   Dominant Hand: Right    Extremity/Trunk Assessment   Upper Extremity Assessment Upper Extremity Assessment: Overall WFL for tasks  assessed;RUE deficits/detail RUE Deficits / Details: RUE strength grossly 4-/5, slightly decreased grip strength RUE Coordination: decreased fine motor;decreased gross motor    Lower Extremity Assessment Lower Extremity Assessment: RLE deficits/detail RLE Deficits / Details: 4- strength esp limiting to gait RLE Coordination: decreased fine motor;decreased gross motor    Cervical / Trunk Assessment Cervical / Trunk Assessment: Normal  Communication   Communication: Expressive difficulties(slurring which frustrates pt)  Cognition Arousal/Alertness: Awake/alert Behavior During Therapy: WFL for tasks assessed/performed Overall Cognitive Status: Within Functional Limits for tasks assessed                                        General Comments      Exercises     Assessment/Plan    PT Assessment Patient needs continued PT services  PT Problem List Decreased strength;Decreased range of motion;Decreased activity tolerance;Decreased balance;Decreased mobility;Decreased coordination;Decreased knowledge of use of DME;Decreased safety awareness;Cardiopulmonary status limiting activity;Decreased skin integrity;Pain       PT Treatment Interventions DME instruction;Stair training;Gait training;Functional mobility training;Therapeutic activities;Therapeutic exercise;Balance training;Neuromuscular re-education;Patient/family education    PT Goals (Current goals can be found in the Care Plan section)  Acute Rehab PT Goals Patient Stated Goal: To go home  PT Goal Formulation: With patient Time For Goal Achievement: 01/11/18 Potential to Achieve Goals: Fair    Frequency Min 2X/week   Barriers to discharge Inaccessible home environment;Decreased caregiver support has no assistance with home    Co-evaluation               AM-PAC PT "6 Clicks" Daily Activity  Outcome Measure Difficulty turning over in bed (including adjusting bedclothes, sheets and blankets)?:  Unable Difficulty moving from lying on back to sitting on the side of the bed? : Unable Difficulty sitting down on and standing up from a chair with arms (e.g., wheelchair, bedside commode, etc,.)?: Unable Help needed moving to and from a bed to chair (including a wheelchair)?: A Little Help needed walking in hospital room?: A Lot Help needed climbing 3-5 steps with a railing? : Total 6 Click Score: 9    End of Session Equipment Utilized During Treatment: Gait belt Activity Tolerance: Patient limited by fatigue;Patient limited by pain Patient left: in bed;with call bell/phone within reach;with bed alarm set Nurse Communication: Mobility status(discussed fall and injur to L ankle, ? imaging) PT Visit Diagnosis: Other abnormalities of gait and mobility (R26.89);Unsteadiness on feet (R26.81);Repeated falls (R29.6);Muscle weakness (generalized) (M62.81);Pain;Hemiplegia and hemiparesis;Adult, failure to thrive (R62.7) Hemiplegia - Right/Left: Right Hemiplegia - dominant/non-dominant: Dominant Hemiplegia - caused by: Unspecified Pain - Right/Left: Left    Time: 1610-9604 PT Time Calculation (min) (ACUTE ONLY): 31 min   Charges:   PT Evaluation $PT Eval Moderate Complexity: 1 Mod PT Treatments $Therapeutic Activity: 8-22 mins   PT G Codes:   PT G-Codes **NOT FOR INPATIENT CLASS** Functional Assessment Tool Used: AM-PAC 6 Clicks Basic Mobility    Ivar Drape 12/28/2017, 11:46 PM   11:48 PM, 12/28/17 Samul Dada, PT, MS Physical Therapist - [place] [phone] (Office)

## 2017-12-28 NOTE — Progress Notes (Signed)
PROGRESS NOTE    Shannon [name]  WUJ:[id number]  DOB: [birth year]  DOA: 12/27/2017 PCP: Nila Nephew, MD   Brief Admission Hx: Shannon [name] is a 67 y.o. female with medical history of CAD s/p stenting, h/o multiple CVAs in the past on ASA and Plavix, DM2, HLD, HTN, presents for dysarthria and right facial droop which started sometime this morning but she is unable to tell me when. Her friend noticed it when she visited her today. The patient thinks her vision has been effected as well but cannot tell me exactly how. There is not double vision and no visual field defects. She thinks her vision is blurry. No other deficits noted by patient.   MDM/Assessment & Plan:   1. Acute CVA -the patient has cerebrovascular disease and a history of multiple CVAs in the past and has a loop recorder.  Her new symptoms developed after a GI illness.  She reports that she has not really been taking her aspirin.  She is not taking a statin any longer.  She is being admitted for an MRI, echocardiogram and carotid Doppler studies.  Also, speech therapy has been consulted.  I like for her to resume aspirin and Plavix and I have restarted atorvastatin 40 mg daily.  Neurology has been consulted as well.  2. Acute kidney injury-treating with gentle IV fluid hydration.  Repeat BMP in the morning. 3. Hyperlipidemia- suboptimally controlled LDL-restarted atorvastatin. 4. Type 2 diabetes mellitus-optimally controlled as evidenced by hemoglobin A1c of 6.7%.  Treating with sliding scale coverage and monitoring CBG. 5. Left facial droop- workup as noted above. 6. Hypothyroidism-continue Synthroid. 7. Epilepsy- continue Keppra. 8. IBS-we will continue to monitor for further episodes of diarrhea. 9. Essential hypertension-allowing for permissive hypertension until CVA is ruled out.  DVT prophylaxis: Lovenox Code Status: Full Family Communication: No one present at bedside during rounds Disposition Plan:  TBD   Consultants:  neurology   Subjective: Pt has persistent left facial droop.   Objective: Vitals:   12/28/17 0824 12/28/17 1024 12/28/17 1224 12/28/17 1624  BP: 106/85 (!) 167/98 (!) 149/64 (!) 130/49  Pulse: 69 63 67 (!) 56  Resp: 16 17 18 20   Temp: 98.4 F (36.9 C) 97.6 F (36.4 C) 98 F (36.7 C) 97.9 F (36.6 C)  TempSrc: Oral Oral Oral Oral  SpO2: 100% 100% 97% 99%  Weight:      Height:        Intake/Output Summary (Last 24 hours) at 12/28/2017 1711 Last data filed at 12/28/2017 1641 Gross per 24 hour  Intake 1482.5 ml  Output 700 ml  Net 782.5 ml   Filed Weights   12/27/17 2024  Weight: 65.5 kg (144 lb 6.4 oz)     REVIEW OF SYSTEMS  As per history otherwise all reviewed and reported negative  Exam:  General exam: awake,alert, NAD, cooperative, slightly slurred speech, left facial droop. Respiratory system: Clear. No increased work of breathing. Cardiovascular system: S1 & S2 heard, RRR. No JVD, murmurs, gallops, clicks or pedal edema. Gastrointestinal system: Abdomen is nondistended, soft and nontender. Normal bowel sounds heard. Central nervous system: Alert and oriented. Mild left hemiparesis.  Extremities: no CCE.  Data Reviewed: Basic Metabolic Panel: Recent Labs  Lab 12/27/17 1403 12/27/17 1414 12/28/17 0606  NA 136 135 144  K 4.1 3.5 3.5  CL 102 98* 107  CO2  --  24 25  GLUCOSE 257* 246* 95  BUN 21* 21* 20  CREATININE 2.70* 2.76* 2.02*  CALCIUM  --  9.3 9.0   Liver Function Tests: Recent Labs  Lab 12/27/17 1414  AST 21  ALT 16  ALKPHOS 86  BILITOT 0.7  PROT 7.0  ALBUMIN 3.7   No results for input(s): LIPASE, AMYLASE in the last 168 hours. No results for input(s): AMMONIA in the last 168 hours. CBC: Recent Labs  Lab 12/27/17 1403 12/27/17 1414  WBC  --  7.6  NEUTROABS  --  5.6  HGB 15.3* 12.8  HCT 45.0 39.0  MCV  --  82.5  PLT  --  224   Cardiac Enzymes: No results for input(s): CKTOTAL, CKMB, CKMBINDEX,  TROPONINI in the last 168 hours. CBG (last 3)  Recent Labs    12/28/17 0804 12/28/17 1125 12/28/17 1628  GLUCAP 101* 161* 166*   No results found for this or any previous visit (from the past 240 hour(s)).   Studies: Dg Chest 2 View  Result Date: 12/27/2017 CLINICAL DATA:  Speech disturbance beginning today. Possible stroke. EXAM: CHEST  2 VIEW COMPARISON:  10/14/2017 FINDINGS: Heart size is normal. Mediastinal shadows are normal. The lungs are clear. The vascularity is normal. No effusions. Chronic bony degenerative changes noted affecting the spine. Loop recorder again visible. IMPRESSION: No active cardiopulmonary disease. Electronically Signed   By: Paulina Fusi M.D.   On: 12/27/2017 15:10   US Carotid Bilateral (at [facility] Only)  Result Date: 12/27/2017 CLINICAL DATA:  TIA. EXAM: BILATERAL CAROTID DUPLEX ULTRASOUND TECHNIQUE: Wallace Cullens scale imaging, color Doppler and duplex ultrasound were performed of bilateral carotid and vertebral arteries in the neck. COMPARISON:  06/09/2017 FINDINGS: Criteria: Quantification of carotid stenosis is based on velocity parameters that correlate the residual internal carotid diameter with NASCET-based stenosis levels, using the diameter of the distal internal carotid lumen as the denominator for stenosis measurement. The following velocity measurements were obtained: RIGHT ICA:  145 cm/sec CCA:  91 cm/sec SYSTOLIC ICA/CCA RATIO:  1.6 DIASTOLIC ICA/CCA RATIO:  2.3 ECA:  66 cm/sec LEFT ICA:  50 cm/sec CCA:  60 cm/sec SYSTOLIC ICA/CCA RATIO:  0.8 DIASTOLIC ICA/CCA RATIO:  0.8 ECA:  44 cm/sec RIGHT CAROTID ARTERY: Small amount of plaque at the right carotid bulb. External carotid artery is patent with normal waveform. Small amount of plaque in the internal carotid artery. Peak systolic velocity in the mid internal carotid artery is mildly elevated measuring 145 cm/sec. Mild tortuosity in the mid internal carotid artery. RIGHT VERTEBRAL ARTERY: Antegrade flow and  normal waveform in the right vertebral artery. LEFT CAROTID ARTERY: Calcified plaque in the distal common carotid artery and bulb region. Plaque in the proximal external carotid artery region. External carotid artery is patent with normal waveform. Focal plaque in the proximal internal carotid artery appear stable. Again noted are low velocities in the carotid arteries particularly in the left internal carotid artery. Normal waveforms in left carotid arteries. The low velocities in the internal carotid artery are similar to the previous examination. LEFT VERTEBRAL ARTERY: Antegrade flow and normal waveform in the left vertebral artery. IMPRESSION: Stable carotid artery duplex. Again noted is a minimally elevated peak systolic velocity in the right internal carotid artery. Findings could represent a 50-69% stenosis in the right internal carotid artery, closer to 50%. Again noted is focal plaque in the proximal left internal carotid artery. Estimated degree of stenosis in left internal carotid artery is less than 50%. Patent vertebral arteries with antegrade flow. Electronically Signed   By: Richarda Overlie M.D.   On: 12/27/2017 17:08   Ct  Head Code Stroke Wo Contrast`  Result Date: 12/27/2017 CLINICAL DATA:  Code stroke. Focal neuro deficit less than 6 hours. Right facial droop, slurred speech EXAM: CT HEAD WITHOUT CONTRAST TECHNIQUE: Contiguous axial images were obtained from the base of the skull through the vertex without intravenous contrast. COMPARISON:  CT head 06/10/2017 FINDINGS: Brain: Negative for acute infarct.  Negative for hemorrhage or mass. Chronic right PCA infarct in the right occipital lobe unchanged. Chronic left parietal cortical infarct unchanged. Chronic infarcts in the white matter bilaterally. Mild atrophy. Vascular: Negative for acute vascular thrombosis Skull: Negative Sinuses/Orbits: Negative Other: None ASPECTS (Alberta Stroke Program Early CT Score) - Ganglionic level infarction (caudate,  lentiform nuclei, internal capsule, insula, M1-M3 cortex): 7 - Supraganglionic infarction (M4-M6 cortex): 3 Total score (0-10 with 10 being normal): 10 IMPRESSION: 1. No acute abnormality 2. ASPECTS is 10 3. Extensive chronic ischemic changes stable from the prior study. 4. These results were called by telephone at the time of interpretation on 12/27/2017 at 2:17 pm to Dr. Marily MemosJASON MESNER , who verbally acknowledged these results. Electronically Signed   By: Marlan Palauharles  Clark M.D.   On: 12/27/2017 14:17     Scheduled Meds: . aspirin EC  81 mg Oral Daily  . atorvastatin  40 mg Oral q1800  . clopidogrel  75 mg Oral Daily  . escitalopram  10 mg Oral Daily  . gabapentin  400 mg Oral BID  . insulin aspart  0-5 Units Subcutaneous QHS  . insulin aspart  0-9 Units Subcutaneous TID WC  . isosorbide mononitrate  30 mg Oral Daily  . levothyroxine  25 mcg Oral QAC breakfast  . linagliptin  5 mg Oral Daily  . multivitamin with minerals  1 tablet Oral Daily  . temazepam  30 mg Oral QHS   Continuous Infusions: . sodium chloride 75 mL/hr at 12/28/17 1047    Principal Problem:   CVA (cerebral vascular accident) [facility](HCC) Active Problems:   CAD S/P LAD PCI July 2011   Hypertension, essential   IBS (irritable bowel syndrome)   DM type 2 (diabetes mellitus, type 2) (HCC)   Time spent:   Standley Dak[place]ford Natnael Biederman, MD, FAAFP [facility] Pager [phone]  If 7PM-7AM, please contact night-coverage [email or web address] Password [email or web address] 12/28/2017, 5:11 PM    LOS: 1 day

## 2017-12-29 DIAGNOSIS — K580 Irritable bowel syndrome with diarrhea: Secondary | ICD-10-CM

## 2017-12-29 DIAGNOSIS — E118 Type 2 diabetes mellitus with unspecified complications: Secondary | ICD-10-CM

## 2017-12-29 DIAGNOSIS — I10 Essential (primary) hypertension: Secondary | ICD-10-CM

## 2017-12-29 DIAGNOSIS — N179 Acute kidney failure, unspecified: Secondary | ICD-10-CM

## 2017-12-29 DIAGNOSIS — I639 Cerebral infarction, unspecified: Secondary | ICD-10-CM

## 2017-12-29 LAB — COMPREHENSIVE METABOLIC PANEL
ALBUMIN: 2.9 g/dL — AB (ref 3.5–5.0)
ALK PHOS: 84 U/L (ref 38–126)
ALT: 24 U/L (ref 14–54)
ANION GAP: 9 (ref 5–15)
AST: 19 U/L (ref 15–41)
BILIRUBIN TOTAL: 0.3 mg/dL (ref 0.3–1.2)
BUN: 20 mg/dL (ref 6–20)
CALCIUM: 8.5 mg/dL — AB (ref 8.9–10.3)
CO2: 24 mmol/L (ref 22–32)
CREATININE: 1.52 mg/dL — AB (ref 0.44–1.00)
Chloride: 107 mmol/L (ref 101–111)
GFR calc Af Amer: 40 mL/min — ABNORMAL LOW (ref >60)
GFR calc non Af Amer: 34 mL/min — ABNORMAL LOW (ref >60)
GLUCOSE: 97 mg/dL (ref 65–99)
Potassium: 3.5 mmol/L (ref 3.5–5.1)
Sodium: 140 mmol/L (ref 135–145)
TOTAL PROTEIN: 5.3 g/dL — AB (ref 6.5–8.1)

## 2017-12-29 LAB — CBC
HCT: 31.7 % — ABNORMAL LOW (ref 36.0–46.0)
Hemoglobin: 10.3 g/dL — ABNORMAL LOW (ref 12.0–15.0)
MCH: 26.9 pg (ref 26.0–34.0)
MCHC: 32.5 g/dL (ref 30.0–36.0)
MCV: 82.8 fL (ref 78.0–100.0)
Platelets: 212 10*3/uL (ref 150–400)
RBC: 3.83 MIL/uL — ABNORMAL LOW (ref 3.87–5.11)
RDW: 12.7 % (ref 11.5–15.5)
WBC: 6.4 10*3/uL (ref 4.0–10.5)

## 2017-12-29 LAB — GLUCOSE, CAPILLARY
GLUCOSE-CAPILLARY: 118 mg/dL — AB (ref 65–99)
GLUCOSE-CAPILLARY: 57 mg/dL — AB (ref 65–99)
GLUCOSE-CAPILLARY: 221 mg/dL — AB (ref 65–99)
Glucose-Capillary: 84 mg/dL (ref 65–99)
Glucose-Capillary: 156 mg/dL — ABNORMAL HIGH (ref 65–99)

## 2017-12-29 MED ORDER — HEPARIN SODIUM (PORCINE) 5000 UNIT/ML IJ SOLN
5000.0000 [IU] | Freq: Three times a day (TID) | INTRAMUSCULAR | Status: DC
  Administered 2017-12-29 – 2017-12-30 (×3): 5000 [IU] via SUBCUTANEOUS
  Filled 2017-12-29 (×3): qty 1

## 2017-12-29 MED ORDER — METOPROLOL TARTRATE 25 MG PO TABS
50.0000 mg | ORAL_TABLET | Freq: Every day | ORAL | Status: DC
  Administered 2017-12-30: 50 mg via ORAL
  Filled 2017-12-29: qty 2

## 2017-12-29 MED ORDER — METOPROLOL TARTRATE 25 MG PO TABS
25.0000 mg | ORAL_TABLET | Freq: Every day | ORAL | Status: DC
  Administered 2017-12-29: 25 mg via ORAL
  Filled 2017-12-29 (×2): qty 1

## 2017-12-29 MED ORDER — HYDROXYZINE HCL 25 MG PO TABS: 25.0000 mg | ORAL_TABLET | Freq: Two times a day (BID) | ORAL | Status: DC | PRN

## 2017-12-29 NOTE — Progress Notes (Signed)
PROGRESS NOTE    Greggory Stallionsther B Vlachos  ZOX:[id number] DOB: 12/08/1950 DOA: 12/27/2017 PCP: Nila NephewGreen, Edwin, MD    Brief Narrative:  Johnn Haisther B Dunnis a 67 y.o.femalewith medical history ofCAD s/p stenting, h/omultipleCVAsin the past on ASA and Plavix, DM2, HLD, HTN, presents for dysarthria and right facial droopwhich started sometime this morning but she is unable to tell me when. Her friend noticed it when she visited her today. The patient thinks her vision has been effected as well but cannot tell me exactly how. There is not double vision and no visual field defects. She thinks her vision is blurry. No other deficits noted by patient.    Assessment & Plan:   Principal Problem:   CVA (cerebral vascular accident) [facility](HCC) Active Problems:   CAD S/P LAD PCI July 2011   Hypertension, essential   IBS (irritable bowel syndrome)   DM type 2 (diabetes mellitus, type 2) (HCC)   Left ankle pain and swelling   H/O ischemic right PCA stroke   1. Acute CVA.  Multiple recurrent cortical small infarcts involving the left hemisphere likely due to occlusion of the left MCA intracranially.  Seen by neurology and recommendation for dual antiplatelet therapy along with statin.  Echocardiogram is unremarkable.  Carotid duplex shows that there may be stenosis in the right internal carotid artery between 50-69%, closer to 50%.  This will need to be followed as an outpatient.  Left-sided showed less than 50% stenosis.  Seen by physical therapy and recommended skilled nursing facility placement. 2. Acute kidney injury.  Improving with gentle IV hydration.  ARB currently on hold.  Repeat labs in a.m. 3. Hypertension.  Blood pressures are trending up.  Will restart on metoprolol. 4. Diabetes.  Low blood sugars this morning.  Hold further oral agents including glimepiride and Tradjenta 5. Hypothyroidism.  Continue on Synthroid 6. Seizure disorder.  Continue on Keppra 7. IBS.  No further episodes of diarrhea at this  time.   DVT prophylaxis: Heparin Code Status: DNR Family Communication: No family present Disposition Plan: Currently awaiting bed at skilled nursing facility   Consultants:   Neurology  Procedures:  Echo:- Left ventricle: The cavity size was normal. Wall thickness was   increased in a pattern of mild LVH. Systolic function was normal.   The estimated ejection fraction was in the range of 55% to 60%.   Wall motion was normal; there were no regional wall motion   abnormalities. The study is not technically sufficient to allow   evaluation of LV diastolic function. - Aortic valve: Mildly calcified annulus. Trileaflet; mildly   calcified leaflets. - Mitral valve: Mildly calcified annulus. There was mild   regurgitation. - Left atrium: The atrium was at the upper limits of normal in   size. - Right ventricle: There are prominent and somewhat calcified   trabeculations and muscle bands at the apex. - Right atrium: Echodensity seen inconsistently in right atrium is   consistent with visualization of the thickened atrial septum as   it bows from left to right. Central venous pressure (est): 3 mm   Hg. - Tricuspid valve: There was mild regurgitation. - Pulmonary arteries: Systolic pressure could not be accurately   estimated. - Pericardium, extracardiac: A prominent pericardial fat pad was    present.  Antimicrobials:     Subjective: Feeling better today.  No shortness of breath or chest pain.  Objective: Vitals:   12/29/17 0024 12/29/17 0424 12/29/17 1400 12/29/17 1750  BP: (!) 136/56 122/67 118/69 Marland Kitchen(!)  157/63  Pulse: 77 75 69 72  Resp: 17 18  18   Temp: 98.2 F (36.8 C) 97.7 F (36.5 C) 98.2 F (36.8 C) 98.7 F (37.1 C)  TempSrc: Oral Oral Oral Oral  SpO2: 97% 98% 97% 100%  Weight:      Height:        Intake/Output Summary (Last 24 hours) at 12/29/2017 1937 Last data filed at 12/29/2017 1500 Gross per 24 hour  Intake 240 ml  Output 800 ml  Net -560 ml    Filed Weights   12/27/17 2024  Weight: 65.5 kg (144 lb 6.4 oz)    Examination:  General exam: Appears calm and comfortable  Respiratory system: Clear to auscultation. Respiratory effort normal. Cardiovascular system: S1 & S2 heard, RRR. No JVD, murmurs, rubs, gallops or clicks. No pedal edema. Gastrointestinal system: Abdomen is nondistended, soft and nontender. No organomegaly or masses felt. Normal bowel sounds heard. Central nervous system: Alert and oriented. No focal neurological deficits. Extremities: Symmetric 5 x 5 power. Skin: No rashes, lesions or ulcers Psychiatry: Judgement and insight appear normal. Mood & affect appropriate.     Data Reviewed: I have personally reviewed following labs and imaging studies  CBC: Recent Labs  Lab 12/27/17 1403 12/27/17 1414 12/29/17 0406  WBC  --  7.6 6.4  NEUTROABS  --  5.6  --   HGB 15.3* 12.8 10.3*  HCT 45.0 39.0 31.7*  MCV  --  82.5 82.8  PLT  --  224 212   Basic Metabolic Panel: Recent Labs  Lab 12/27/17 1403 12/27/17 1414 12/28/17 0606 12/29/17 0406  NA 136 135 144 140  K 4.1 3.5 3.5 3.5  CL 102 98* 107 107  CO2  --  24 25 24   GLUCOSE 257* 246* 95 97  BUN 21* 21* 20 20  CREATININE 2.70* 2.76* 2.02* 1.52*  CALCIUM  --  9.3 9.0 8.5*   GFR: Estimated Creatinine Clearance: 30.6 mL/min (A) (by C-G formula based on SCr of 1.52 mg/dL (H)). Liver Function Tests: Recent Labs  Lab 12/27/17 1414 12/29/17 0406  AST 21 19  ALT 16 24  ALKPHOS 86 84  BILITOT 0.7 0.3  PROT 7.0 5.3*  ALBUMIN 3.7 2.9*   No results for input(s): LIPASE, AMYLASE in the last 168 hours. No results for input(s): AMMONIA in the last 168 hours. Coagulation Profile: Recent Labs  Lab 12/27/17 1414  INR 1.10   Cardiac Enzymes: No results for input(s): CKTOTAL, CKMB, CKMBINDEX, TROPONINI in the last 168 hours. BNP (last 3 results) No results for input(s): PROBNP in the last 8760 hours. HbA1C: Recent Labs    12/28/17 0606  HGBA1C  6.7*   CBG: Recent Labs  Lab 12/28/17 1628 12/29/17 0155 12/29/17 0748 12/29/17 1107 12/29/17 1615  GLUCAP 166* 118* 57* 84 156*   Lipid Profile: Recent Labs    12/28/17 0606  CHOL 174  HDL 34*  LDLCALC 115*  TRIG 127  CHOLHDL 5.1   Thyroid Function Tests: No results for input(s): TSH, T4TOTAL, FREET4, T3FREE, THYROIDAB in the last 72 hours. Anemia Panel: No results for input(s): VITAMINB12, FOLATE, FERRITIN, TIBC, IRON, RETICCTPCT in the last 72 hours. Sepsis Labs: No results for input(s): PROCALCITON, LATICACIDVEN in the last 168 hours.  No results found for this or any previous visit (from the past 240 hour(s)).       Radiology Studies: Dg Ankle 2 Views Left  Result Date: 12/28/2017 CLINICAL DATA:  Generalized left ankle pain starting 1.5 weeks ago  after trauma. EXAM: LEFT ANKLE - 2 VIEW COMPARISON:  None. FINDINGS: There is subtle curvilinear lucency in the distal fibula, the patient has focal pain here, nondisplaced fracture is not excluded. There is plantar calcaneal spur. There is no dislocation. IMPRESSION: There is subtle curvilinear lucency in the distal fibula, the patient has focal pain here, nondisplaced fracture is not excluded. Electronically Signed   By: Sherian Rein M.D.   On: 12/28/2017 19:15   Mr Shirlee Latch NW Contrast  Result Date: 12/28/2017 CLINICAL DATA:  67 y/o F; history of prior strokes. Patient presents with right facial droop and slurred speech. EXAM: MRI HEAD WITHOUT CONTRAST MRA HEAD WITHOUT CONTRAST TECHNIQUE: Multiplanar, multiecho pulse sequences of the brain and surrounding structures were obtained without intravenous contrast. Angiographic images of the head were obtained using MRA technique without contrast. COMPARISON:  12/27/2017 CT head. 06/10/2017 CT angiogram head. 06/09/2017 MRI head. FINDINGS: MRI HEAD FINDINGS Motion degradation on multiple sequences. Brain: Several small foci of reduced diffusion measuring up to 14 mm are scattered  throughout the left lateral frontal lobe compatible with acute/early subacute infarction. Chronic infarctions of right PCA distribution and scattered throughout left frontal and parietal lobes. Chronic lacunar infarcts are present in bilateral basal ganglia. Stable background of chronic microvascular ischemic changes and parenchymal volume loss of the brain. No gross hemorrhage. No hydrocephalus, effacement of basilar cisterns, or extra-axial collection. Vascular: As below. Skull and upper cervical spine: Normal marrow signal. Sinuses/Orbits: Negative. Other: None. MRA HEAD FINDINGS Severe motion artifact, suboptimal assessment for aneurysm or stenosis. Internal carotid arteries: Patent bilaterally. The left is small in caliber. Anterior cerebral arteries: Proximal left ACA is patent. Faint flow related signal and right A1, no appreciable downstream right ACA. Middle cerebral arteries: Patent right. No appreciable signal in left MCA, possibly due to occlusion or secondary to high-grade stenosis in left M1 as seen on prior CTA. Anterior communicating artery: Motion degraded. Posterior communicating arteries:  Motion degraded. Posterior cerebral arteries: Patent left PCA. Left P1 stenosis. No right PCA identified. Basilar artery:  Patent. Vertebral arteries:  Patent. IMPRESSION: MRI head: Motion degradation of multiple sequences. 1. Several small foci of acute/early subacute infarction within left MCA distribution. No gross hemorrhage or mass effect identified. 2. Stable background of extensive chronic microvascular ischemic changes, chronic infarcts, and volume loss. MRA head: 1. Severe motion artifact. 2. No flow related signal in left MCA, possibly due to occlusion or secondary to high-grade left M1 stenosis as seen on prior CTA. If clinically indicated, patency may be better assessed with CT angiogram. 3. Patent bilateral ICA, right MCA, and left ACA. 4. Faint flow related signal and right A1, no appreciable  distal right ACA may be due to small caliber, occlusion, or proximal stenosis. 5. Chronic right PCA occlusion. 6. Left P1 stenosis. These results will be called to the ordering clinician or representative by the Radiologist Assistant, and communication documented in the PACS or zVision Dashboard. Electronically Signed   By: Mitzi Hansen M.D.   On: 12/28/2017 19:10   Mr Brain Wo Contrast  Result Date: 12/28/2017 CLINICAL DATA:  67 y/o F; history of prior strokes. Patient presents with right facial droop and slurred speech. EXAM: MRI HEAD WITHOUT CONTRAST MRA HEAD WITHOUT CONTRAST TECHNIQUE: Multiplanar, multiecho pulse sequences of the brain and surrounding structures were obtained without intravenous contrast. Angiographic images of the head were obtained using MRA technique without contrast. COMPARISON:  12/27/2017 CT head. 06/10/2017 CT angiogram head. 06/09/2017 MRI head. FINDINGS: MRI HEAD  FINDINGS Motion degradation on multiple sequences. Brain: Several small foci of reduced diffusion measuring up to 14 mm are scattered throughout the left lateral frontal lobe compatible with acute/early subacute infarction. Chronic infarctions of right PCA distribution and scattered throughout left frontal and parietal lobes. Chronic lacunar infarcts are present in bilateral basal ganglia. Stable background of chronic microvascular ischemic changes and parenchymal volume loss of the brain. No gross hemorrhage. No hydrocephalus, effacement of basilar cisterns, or extra-axial collection. Vascular: As below. Skull and upper cervical spine: Normal marrow signal. Sinuses/Orbits: Negative. Other: None. MRA HEAD FINDINGS Severe motion artifact, suboptimal assessment for aneurysm or stenosis. Internal carotid arteries: Patent bilaterally. The left is small in caliber. Anterior cerebral arteries: Proximal left ACA is patent. Faint flow related signal and right A1, no appreciable downstream right ACA. Middle cerebral  arteries: Patent right. No appreciable signal in left MCA, possibly due to occlusion or secondary to high-grade stenosis in left M1 as seen on prior CTA. Anterior communicating artery: Motion degraded. Posterior communicating arteries:  Motion degraded. Posterior cerebral arteries: Patent left PCA. Left P1 stenosis. No right PCA identified. Basilar artery:  Patent. Vertebral arteries:  Patent. IMPRESSION: MRI head: Motion degradation of multiple sequences. 1. Several small foci of acute/early subacute infarction within left MCA distribution. No gross hemorrhage or mass effect identified. 2. Stable background of extensive chronic microvascular ischemic changes, chronic infarcts, and volume loss. MRA head: 1. Severe motion artifact. 2. No flow related signal in left MCA, possibly due to occlusion or secondary to high-grade left M1 stenosis as seen on prior CTA. If clinically indicated, patency may be better assessed with CT angiogram. 3. Patent bilateral ICA, right MCA, and left ACA. 4. Faint flow related signal and right A1, no appreciable distal right ACA may be due to small caliber, occlusion, or proximal stenosis. 5. Chronic right PCA occlusion. 6. Left P1 stenosis. These results will be called to the ordering clinician or representative by the Radiologist Assistant, and communication documented in the PACS or zVision Dashboard. Electronically Signed   By: Mitzi Hansen M.D.   On: 12/28/2017 19:10        Scheduled Meds: . aspirin EC  81 mg Oral Daily  . atorvastatin  40 mg Oral q1800  . clopidogrel  75 mg Oral Daily  . escitalopram  10 mg Oral Daily  . gabapentin  400 mg Oral BID  . heparin injection (subcutaneous)  5,000 Units Subcutaneous Q8H  . insulin aspart  0-5 Units Subcutaneous QHS  . insulin aspart  0-9 Units Subcutaneous TID WC  . isosorbide mononitrate  30 mg Oral Daily  . levothyroxine  25 mcg Oral QAC breakfast  . metoprolol tartrate  25-50 mg Oral BID  . multivitamin  with minerals  1 tablet Oral Daily  . temazepam  30 mg Oral QHS   Continuous Infusions: . sodium chloride 50 mL/hr at 12/29/17 0439     LOS: 2 days    Time spent: 30 minutes    Erick Blinks, MD [facility] Pager [phone]  If 7PM-7AM, please contact night-coverage [email or web address] Password [facility] 12/29/2017, 7:37 PM

## 2017-12-29 NOTE — Care Management Important Message (Signed)
Important Message  Patient Details  Name: Greggory Stallionsther B Grigorian MRN: [id number] Date of Birth: 07/21/1951   Medicare Important Message Given:  Yes    Malcolm MetroChildress, Jeronica Stlouis Demske, RN 12/29/2017, 11:25 AM

## 2017-12-29 NOTE — Progress Notes (Signed)
Physical Therapy Treatment Patient Details Name: Shannon [name] MRN: [id number] DOB: 03-Sep-1951 Today's Date: 12/29/2017    History of Present Illness Shannon [name] is a 67 y.o. female with medical history of CAD s/p stenting, h/o multiple CVAs in the past on ASA and Plavix, DM2, HLD, HTN, presents for dysarthria and right facial droop which started sometime this morning but she is unable to tell me when. Her friend noticed it when she visited her today. The patient thinks her vision has been effected as well but cannot tell me exactly how. There is not double vision and no visual field defects. She thinks her vision is blurry. No other deficits noted by patient. She has not taken her aspirin in a few days but has been trying to take her Plavix and other home medications.    PT Comments    Patient is progressing in therapy and was able to advance gait training today with RW. She ambulated 100 feet with RW and requires min guard/assist with cues for safe management of walker and negotiation of obstacles. She has demonstrated improved activity tolerance and performed exercises standing and seated at EOB. She will continue to benefit from skilled PT intervention as she remains below her baseline of independent to mobilize with assistive device. Recommending continued services at venue below and Acute PT will follow to progress patient towards discharge goal.   Follow Up Recommendations  SNF     Equipment Recommendations  Rolling walker with 5" wheels    Recommendations for Other Services       Precautions / Restrictions Precautions Precautions: Fall Restrictions Weight Bearing Restrictions: No    Mobility  Bed Mobility Overal bed mobility: Modified Independent      General bed mobility comments: using rail to assist sitting, cues to sequence supine to sit safely and efficiently  Transfers Overall transfer level: Needs assistance Equipment used: Quad cane;1 person hand held  assist Transfers: Sit to/from Stand Sit to Stand: Min guard;Min assist    General transfer comment: Increased time and cues for hand placement to push up from bed  Ambulation/Gait Ambulation/Gait assistance: Min assist;Min guard Ambulation Distance (Feet): 100 Feet Assistive device: Rolling walker (2 wheeled) Gait Pattern/deviations: Step-to pattern;Decreased stride length;Wide base of support;Trunk flexed;Decreased weight shift to right Gait velocity: indicitive of fall risk Gait velocity interpretation: Below normal speed for age/gender General Gait Details: no overt LOB during ambulation, patietn able to self grade distance and energy level; cues needed for safe managemtn of RW and negotiation of obstacles/doorways    Modified Rankin (Stroke Patients Only) Modified Rankin (Stroke Patients Only) Pre-Morbid Rankin Score: Slight disability Modified Rankin: Moderate disability     Balance Overall balance assessment: Needs assistance;History of Falls Sitting-balance support: Feet supported;Single extremity supported Sitting balance-Leahy Scale: Fair Sitting balance - Comments: able to scoot forward and eight shift sitting edge of bed Postural control: Left lateral lean Standing balance support: Bilateral upper extremity supported Standing balance-Leahy Scale: Poor       Cognition Arousal/Alertness: Awake/alert Behavior During Therapy: WFL for tasks assessed/performed Overall Cognitive Status: Within Functional Limits for tasks assessed         Exercises General Exercises - Lower Extremity Hip Flexion/Marching: AROM;Standing;Both;10 reps(with RW for support) Toe Raises: AROM;Both;10 reps Heel Raises: AROM;10 reps;Both        Pertinent Vitals/Pain Pain Assessment: 0-10 Pain Score: 4  Pain Location: Low Back Pain Descriptors / Indicators: Aching Pain Intervention(s): Limited activity within patient's tolerance;Monitored during session  PT Goals (current  goals can now be found in the care plan section) Acute Rehab PT Goals Patient Stated Goal: To go home  PT Goal Formulation: With patient Time For Goal Achievement: 01/11/18 Potential to Achieve Goals: Fair Progress towards PT goals: Progressing toward goals    Frequency    Min 3X/week      PT Plan Current plan remains appropriate       AM-PAC PT "6 Clicks" Daily Activity  Outcome Measure  Difficulty turning over in bed (including adjusting bedclothes, sheets and blankets)?: A Lot Difficulty moving from lying on back to sitting on the side of the bed? : Unable Difficulty sitting down on and standing up from a chair with arms (e.g., wheelchair, bedside commode, etc,.)?: A Lot Help needed moving to and from a bed to chair (including a wheelchair)?: A Little Help needed walking in hospital room?: A Little Help needed climbing 3-5 steps with a railing? : Total 6 Click Score: 12    End of Session Equipment Utilized During Treatment: Gait belt Activity Tolerance: Patient limited by fatigue;Patient limited by pain Patient left: in bed;with call bell/phone within reach;with bed alarm set(sitting EOB) Nurse Communication: Mobility status(called nurse tech to assist patient with breakfast) PT Visit Diagnosis: Other abnormalities of gait and mobility (R26.89);Unsteadiness on feet (R26.81);Repeated falls (R29.6);Muscle weakness (generalized) (M62.81);Pain;Hemiplegia and hemiparesis;Adult, failure to thrive (R62.7) Hemiplegia - Right/Left: Right Hemiplegia - dominant/non-dominant: Dominant Hemiplegia - caused by: Unspecified     Time: [id number] PT Time Calculation (min) (ACUTE ONLY): 36 min  Charges:  $Gait Training: 8-22 mins $Therapeutic Exercise: 8-22 mins                    G Codes:       Valentino Saxonachel Quinn-Brown, PT, DPT Physical Therapist with [facility]  12/29/2017 11:07 AM

## 2017-12-29 NOTE — Progress Notes (Signed)
Inpatient Diabetes Program Recommendations  AACE/ADA: New Consensus Statement on Inpatient Glycemic Control (2015)  Target Ranges:  Prepandial:   less than 140 mg/dL      Peak postprandial:   less than 180 mg/dL (1-2 hours)      Critically ill patients:  140 - 180 mg/dL   Results for Greggory StallionDUNN, Jaeline B (MRN [id number]) as of 12/29/2017 10:14  Ref. Range 12/28/2017 08:04 12/28/2017 11:25 12/28/2017 16:28 12/29/2017 01:55 12/29/2017 07:48  Glucose-Capillary Latest Ref Range: 65 - 99 mg/dL 952101 (H) 841161 (H) 324166 (H) 118 (H) 57 (L)   Review of Glycemic Control  Diabetes history: DM2 Outpatient Diabetes medications: Amaryl 4 mg BID, Januvia 100 mg daily Current orders for Inpatient glycemic control: Tradjenta 5 mg daily, Novolog 0-9 units TID with meals, Novolog 0-5 units QHS  Inpatient Diabetes Program Recommendations: Oral Agents: Please consider discontinuing Tradjenta 5 mg daily while inpatient.  Thanks, Orlando PennerMarie Germaine Shenker, RN, MSN, CDE Diabetes Coordinator Inpatient Diabetes Program [phone] (Team Pager from 8am to 5pm)

## 2017-12-30 ENCOUNTER — Inpatient Hospital Stay
Admission: RE | Admit: 2017-12-30 | Discharge: 2018-01-15 | Disposition: A | Discharge status: Home / Self Care | Payer: Medicare HMO | Source: Ambulatory Visit | Attending: Internal Medicine | Admitting: Internal Medicine

## 2017-12-30 DIAGNOSIS — T148XXA Other injury of unspecified body region, initial encounter: Principal | ICD-10-CM

## 2017-12-30 LAB — GLUCOSE, CAPILLARY
GLUCOSE-CAPILLARY: 113 mg/dL — AB (ref 65–99)
GLUCOSE-CAPILLARY: 172 mg/dL — AB (ref 65–99)
GLUCOSE-CAPILLARY: 135 mg/dL — AB (ref 65–99)

## 2017-12-30 LAB — BASIC METABOLIC PANEL
ANION GAP: 10 (ref 5–15)
BUN: 16 mg/dL (ref 6–20)
CALCIUM: 8.6 mg/dL — AB (ref 8.9–10.3)
CO2: 24 mmol/L (ref 22–32)
CREATININE: 1.22 mg/dL — AB (ref 0.44–1.00)
Chloride: 107 mmol/L (ref 101–111)
GFR, EST AFRICAN AMERICAN: 52 mL/min — AB (ref >60)
GFR, EST NON AFRICAN AMERICAN: 44 mL/min — AB (ref >60)
Glucose, Bld: 79 mg/dL (ref 65–99)
Potassium: 3.7 mmol/L (ref 3.5–5.1)
SODIUM: 141 mmol/L (ref 135–145)

## 2017-12-30 MED ORDER — METOPROLOL TARTRATE 25 MG PO TABS: 25.0000 mg | ORAL_TABLET | Freq: Two times a day (BID) | ORAL | Status: DC

## 2017-12-30 MED ORDER — TEMAZEPAM 30 MG PO CAPS: 30.0000 mg | ORAL_CAPSULE | Freq: Every day | ORAL | 0 refills | Status: DC

## 2017-12-30 MED ORDER — ATORVASTATIN CALCIUM 40 MG PO TABS: 40.0000 mg | ORAL_TABLET | Freq: Every day | ORAL | Status: AC

## 2017-12-30 NOTE — [place] FL2 (Signed)
[place] MEDICAID FL2 LEVEL OF CARE SCREENING TOOL     IDENTIFICATION  Patient Name: Shannon [name] Birthdate: Jan 28, 1951 Sex: female Admission Date (Current Location): 12/27/2017  [facility] and [place] Number:  [employer] and Address:  [facility],  [address], [place] [address]      Provider Number: [id number]  Attending Physician Name and Address:  Erick Blinks, MD  Relative Name and Phone Number:       Current Level of Care: Hospital Recommended Level of Care: Skilled Nursing Facility Prior Approval Number:    Date Approved/Denied:   PASRR Number:    Discharge Plan: SNF    Current Diagnoses: Patient Active Problem List   Diagnosis Date Noted  . Left ankle pain and swelling 12/28/2017  . CVA (cerebral vascular accident) (HCC) 12/27/2017  . Malnutrition of moderate degree 06/09/2017  . CVA (cerebrovascular accident) (HCC) 04/10/2017  . Dizziness 04/09/2017  . Hemiparesis and alteration of sensations as late effects of stroke (HCC) 06/05/2015  . Numbness 12/27/2014  . Visual disturbance 11/26/2014  . Dyslipidemia 11/20/2014  . Hypothyroidism 11/20/2014  . Pain   . Cerebral thrombosis with cerebral infarction (HCC) 11/19/2014  . Weakness   . NSTEMI (non-ST elevated myocardial infarction) (HCC) 11/17/2014  . Acute CVA -Dec 2015 10/24/2014  . DM type 2 (diabetes mellitus, type 2) (HCC) 10/24/2014  . Accelerated hypertension 10/24/2014  . H/O ischemic right PCA stroke 10/09/2014  . Atypical chest pain 05/16/2014  . CAD S/P LAD PCI July 2011 07/01/2013  . Hypertension, essential 07/01/2013  . Fibromyalgia 07/01/2013  . IBS (irritable bowel syndrome) 07/01/2013    Orientation RESPIRATION BLADDER Height & Weight     Self, Time, Situation, Place  Normal Incontinent(stress incontinence ) Weight: 144 lb 6.4 oz (65.5 kg) Height:  5\' 4"  (162.6 cm)  BEHAVIORAL SYMPTOMS/MOOD NEUROLOGICAL BOWEL NUTRITION STATUS      Continent Diet(Heart  Healthy/carb modified)  AMBULATORY STATUS COMMUNICATION OF NEEDS Skin   Limited Assist Verbally Normal                       Personal Care Assistance Level of Assistance  Bathing, Feeding, Dressing Bathing Assistance: Limited assistance Feeding assistance: Independent Dressing Assistance: Limited assistance     Functional Limitations Info  Sight, Hearing, Speech Sight Info: Adequate Hearing Info: Adequate Speech Info: Adequate    SPECIAL CARE FACTORS FREQUENCY  PT (By licensed PT), OT (By licensed OT)     PT Frequency: 5x/week OT Frequency: 3x/week            Contractures Contractures Info: Not present    Additional Factors Info  Code Status, Allergies, Psychotropic Code Status Info: DNR Allergies Info: Insulins, Sulfa Antibiotics, Lisinopril Psychotropic Info: Lexapro         Current Medications (12/30/2017):  This is the current hospital active medication list Current Facility-Administered Medications  Medication Dose Route Frequency Provider Last Rate Last Dose  . 0.9 %  sodium chloride infusion   Intravenous Continuous Johnson, Clanford L, MD 50 mL/hr at 12/29/17 2346    . acetaminophen (TYLENOL) tablet 650 mg  650 mg Oral Q4H PRN Johnson, Clanford L, MD   650 mg at 12/29/17 1041  . aspirin EC tablet 81 mg  81 mg Oral Daily Calvert Cantor, MD   81 mg at 12/30/17 0843  . atorvastatin (LIPITOR) tablet 40 mg  40 mg Oral q1800 Johnson, Clanford L, MD   40 mg at 12/29/17 1739  .  clopidogrel (PLAVIX) tablet 75 mg  75 mg Oral Daily Calvert Cantorizwan, Saima, MD   75 mg at 12/30/17 0842  . escitalopram (LEXAPRO) tablet 10 mg  10 mg Oral Daily Calvert Cantorizwan, Saima, MD   10 mg at 12/30/17 0842  . gabapentin (NEURONTIN) capsule 400 mg  400 mg Oral BID Calvert Cantorizwan, Saima, MD   400 mg at 12/30/17 0842  . heparin injection 5,000 Units  5,000 Units Subcutaneous Q8H Erick BlinksMemon, Jehanzeb, MD   5,000 Units at 12/30/17 0515  . HYDROcodone-acetaminophen (NORCO/VICODIN) 5-325 MG per tablet 1 tablet  1 tablet  Oral Q6H PRN Johnson, Clanford L, MD      . hydrOXYzine (ATARAX/VISTARIL) tablet 25 mg  25 mg Oral BID PRN Erick BlinksMemon, Jehanzeb, MD      . insulin aspart (novoLOG) injection 0-5 Units  0-5 Units Subcutaneous QHS Calvert Cantorizwan, Saima, MD   2 Units at 12/29/17 2206  . insulin aspart (novoLOG) injection 0-9 Units  0-9 Units Subcutaneous TID WC Rizwan, Saima, MD      . isosorbide mononitrate (IMDUR) 24 hr tablet 30 mg  30 mg Oral Daily Calvert Cantorizwan, Saima, MD   30 mg at 12/30/17 0841  . levothyroxine (SYNTHROID, LEVOTHROID) tablet 25 mcg  25 mcg Oral QAC breakfast Calvert Cantorizwan, Saima, MD   25 mcg at 12/30/17 [phone]  . metoprolol tartrate (LOPRESSOR) tablet 25 mg  25 mg Oral QPC supper Erick BlinksMemon, Jehanzeb, MD   25 mg at 12/29/17 2205  . metoprolol tartrate (LOPRESSOR) tablet 50 mg  50 mg Oral Daily Erick BlinksMemon, Jehanzeb, MD   50 mg at 12/30/17 0843  . multivitamin with minerals tablet 1 tablet  1 tablet Oral Daily Calvert Cantorizwan, Saima, MD   1 tablet at 12/30/17 [phone]  . temazepam (RESTORIL) capsule 30 mg  30 mg Oral QHS Calvert Cantorizwan, Saima, MD   30 mg at 12/29/17 2205     Discharge Medications: Please see discharge summary for a list of discharge medications.  Relevant Imaging Results:  Relevant Lab Results:   Additional Information SSN [id number]  Orella Cushman, Juleen ChinaHeather D, LCSW

## 2017-12-30 NOTE — Clinical Social Work Note (Signed)
Clinical Social Work Assessment  Patient Details  Name: Shannon [name] MRN: [id number] Date of Birth: 03/20/1951  Date of referral:  12/30/17               Reason for consult:  Facility Placement                Permission sought to share information with:    Permission granted to share information::     Name::        Agency::     Relationship::     Contact Information:     Housing/Transportation Living arrangements for the past 2 months:  Single Family Home Source of Information:  Patient Patient Interpreter Needed:  None Criminal Activity/Legal Involvement Pertinent to Current Situation/Hospitalization:  No - Comment as needed Significant Relationships:  Adult Children Lives with:  Self Do you feel safe going back to the place where you live?  Yes Need for family participation in patient care:  No (Coment)  Care giving concerns:  None identified at baseline.    Social Worker assessment / plan:  At baseline, patient ambulates with a cane and is independent in her ADLs. She states that her daughter lives across the street and she son lives down the street and they assist her when needed. [facility]   Employment status:  Retired Database administratornsurance information:  Managed Medicare PT Recommendations:  Skilled Nursing Facility Information / Referral to community resources:  Skilled Nursing Facility  Patient/Family's Response to care:  [facility]   Patient/Family's Understanding of and Emotional Response to Diagnosis, Current Treatment, and Prognosis:  Patient understands her diagnosis, treatment and prognosis.   Emotional Assessment Appearance:  Appears stated age Attitude/Demeanor/Rapport:    Affect (typically observed):  Accepting Orientation:  Oriented to Self, Oriented to Place, Oriented to  Time, Oriented to Situation Alcohol / Substance use:  Not Applicable Psych involvement (Current and /or in the community):  No (Comment)  Discharge  Needs  Concerns to be addressed:  Discharge Planning Concerns Readmission within the last 30 days:  No Current discharge risk:  None Barriers to Discharge:  No Barriers Identified   Annice NeedySettle, Omarie Parcell D, LCSW 12/30/2017, 3:52 PM

## 2017-12-30 NOTE — Evaluation (Signed)
Speech Language Pathology Evaluation Patient Details Name: Greggory Stallionsther B Scheunemann MRN: [id number] DOB: 05/31/1951 Today's Date: 12/30/2017 Time: [id number] SLP Time Calculation (min) (ACUTE ONLY): 24 min  Problem List:  Patient Active Problem List   Diagnosis Date Noted  . Left ankle pain and swelling 12/28/2017  . CVA (cerebral vascular accident) (HCC) 12/27/2017  . Malnutrition of moderate degree 06/09/2017  . CVA (cerebrovascular accident) (HCC) 04/10/2017  . Dizziness 04/09/2017  . Hemiparesis and alteration of sensations as late effects of stroke (HCC) 06/05/2015  . Numbness 12/27/2014  . Visual disturbance 11/26/2014  . Dyslipidemia 11/20/2014  . Hypothyroidism 11/20/2014  . Pain   . Cerebral thrombosis with cerebral infarction (HCC) 11/19/2014  . Weakness   . NSTEMI (non-ST elevated myocardial infarction) (HCC) 11/17/2014  . Acute CVA -Dec 2015 10/24/2014  . DM type 2 (diabetes mellitus, type 2) (HCC) 10/24/2014  . Accelerated hypertension 10/24/2014  . H/O ischemic right PCA stroke 10/09/2014  . Atypical chest pain 05/16/2014  . CAD S/P LAD PCI July 2011 07/01/2013  . Hypertension, essential 07/01/2013  . Fibromyalgia 07/01/2013  . IBS (irritable bowel syndrome) 07/01/2013   Past Medical History:  Past Medical History:  Diagnosis Date  . Anxiety   . Arthritis   . Coronary artery disease    a. s/p PCI with DES to LAD in 2011  (LHC 03/2010: Mid LAD 70%, normal ejection fraction  >> PCI:  2.75 x 15 mm Promus DES to LAD); b. Nuclear Stress Test 05/29/14: No ischemia, EF 59%, Normal; c. NSTEMI 11/2014 >> patient declined LHC b/c of recent CVA  . Depression   . Diabetes mellitus without complication (HCC)   . Dyslipidemia 11/20/2014  . Fibromyalgia 1995  . H/O ischemic right PCA stroke 10/2014   a. Carotid US 10/24/14: No significant ICA stenosis bilaterally  . Hemiparesis and alteration of sensations as late effects of stroke (HCC) 06/05/2015  . Hx of echocardiogram    a.  Echocardiogram 10/25/14: Moderate LVH, EF 50-55%, normal wall motion, grade 2 diastolic dysfunction, mild LAE, atrial septal lipomatous hypertrophy  . Hypertension   . Irritable bowel    Past Surgical History:  Past Surgical History:  Procedure Laterality Date  . CHOLECYSTECTOMY    . CORONARY STENT PLACEMENT    . LOOP RECORDER INSERTION N/A 06/23/2017   Procedure: Loop Recorder Insertion;  Surgeon: Duke SalviaKlein, Steven C, MD;  [facility];  Service: Cardiovascular;  Laterality: N/A;  . orif arm fracture Right    forearm  . TEE WITHOUT CARDIOVERSION N/A 04/12/2017   Procedure: TRANSESOPHAGEAL ECHOCARDIOGRAM (TEE) WITH PROPOFOL;  Surgeon: Jonelle SidleMcDowell, Samuel G, MD;  Location: [facility] ENDO SUITE;  Service: Cardiovascular;  Laterality: N/A;   HPI:  67 y.o. female with medical history of CAD s/p stenting, h/o multiple CVAs in the past on ASA and Plavix, DM2, HLD, HTN, presents for dysarthria and right facial droop which started sometime this morning but she is unable to tell me when. Her friend noticed it when she visited her today. The patient thinks her vision has been effected as well but cannot tell me exactly how. There is not double vision and no visual field defects. She thinks her vision is blurry. No other deficits noted by patient. She has not taken her aspirin in a few days but has been trying to take her Plavix and other home medications. CT negative; MRI head 12/28/17 indicated No flow related signal in left MCA, possibly due to occlusion or secondary to high-grade left M1  stenosis as seen on prior CTA. If clinically indicated, patency may be better assessed with CT angiogram. 3. Patent bilateral ICA, right MCA, and left ACA  Assessment / Plan / Recommendation Clinical Impression   Pt presents with mild dysarthria characterized by low vocal intensity and imprecise articulation during sentence-conversational tasks; intermittent anomia/aphasia noted during complex conversational tasks  infrequently with pt saying "I know what I want to say, but I can't remember the word"; naming tasks may have been skewed d/t visual impairments; question possible neglect as a new symptom not related to prior CVA's; auditory comprehension tasks appear [facility]; cognitive tasks [facility] on MOCA [facility] Cognitive Assessment) with subtests deleted pertaining to writing d/t dominant hand being affected; word fluency decreased on MOCA as well which could be directly impacted by anomia/aphasia; recommend ST services at next venue when pt D/C to SNF.  Thank you for this consult.    SLP Assessment  SLP Recommendation/Assessment: All further Speech Language Pathology  needs can be addressed in the next venue of care SLP Visit Diagnosis: Aphasia (R47.01);Dysarthria and anarthria (R47.1)    Follow Up Recommendations  Skilled Nursing facility    Frequency and Duration     ST to be continued at next venue      SLP Evaluation Cognition  Overall Cognitive Status: Within Functional Limits for tasks assessed Arousal/Alertness: Awake/alert Orientation Level: Oriented to person;Oriented to place;Oriented to situation Memory: Appears intact Awareness: Appears intact Problem Solving: Appears intact Safety/Judgment: Appears intact       Comprehension  Auditory Comprehension Overall Auditory Comprehension: Appears within functional limits for tasks assessed Visual Recognition/Discrimination Discrimination: Not tested Reading Comprehension Reading Status: Unable to assess (comment)(Pt c/o blurriness with left eye affected; ? neglect)    Expression Expression Primary Mode of Expression: Verbal Verbal Expression Overall Verbal Expression: Impaired Initiation: No impairment Level of Generative/Spontaneous Verbalization: Conversation Repetition: No impairment Naming: Impairment Responsive: 76-100% accurate Confrontation: Impaired Convergent: 50-74% accurate Divergent: 50-74% accurate Other Naming Comments:  intermittent anomia within conversation Pragmatics: No impairment Non-Verbal Means of Communication: Not applicable Written Expression Dominant Hand: Right Written Expression: Unable to assess (comment)(dominant hand affected)   Oral / Motor  Oral Motor/Sensory Function Overall Oral Motor/Sensory Function: Mild impairment Facial ROM: Reduced right Facial Symmetry: Abnormal symmetry right Facial Strength: Reduced right Lingual ROM: Reduced right Lingual Symmetry: Abnormal symmetry right Lingual Strength: Within Functional Limits Motor Speech Overall Motor Speech: Appears within functional limits for tasks assessed Respiration: Impaired Level of Impairment: Sentence Phonation: Low vocal intensity Resonance: Within functional limits Articulation: Impaired Level of Impairment: Sentence Intelligibility: Intelligibility reduced Word: 75-100% accurate Phrase: 75-100% accurate Sentence: 50-74% accurate Conversation: 50-74% accurate Motor Planning: Witnin functional limits Motor Speech Errors: Not applicable Effective Techniques: Slow rate;Increased vocal intensity;Over-articulate                      Tressie Stalker, M.S., CCC-SLP 12/30/2017, 5:17 PM

## 2017-12-30 NOTE — Progress Notes (Signed)
Occupational Therapy Treatment Patient Details Name: Shannon [name] MRN: [id number] DOB: 10/24/1951 Today's Date: 12/30/2017    History of present illness Shannon [name] is a 67 y.o. female with medical history of CAD s/p stenting, h/o multiple CVAs in the past on ASA and Plavix, DM2, HLD, HTN, presents for dysarthria and right facial droop which started sometime this morning but she is unable to tell me when. Her friend noticed it when she visited her today. The patient thinks her vision has been effected as well but cannot tell me exactly how. There is not double vision and no visual field defects. She thinks her vision is blurry. No other deficits noted by patient. She has not taken her aspirin in a few days but has been trying to take her Plavix and other home medications.   OT comments  Pt agreeable to OT treatment this am. Pt demonstrating improvement in ADL completion and functional mobility, able to complete standing tasks at sink without difficulty, min guard for balance. Pt used quad cane this am per request, min guard for functional mobility, no LOB noted. Verbal cuing occasionally during session for sequencing, as well as for RUE exercises. Discharge to SNF remains appropriate.    Follow Up Recommendations  SNF    Equipment Recommendations  None recommended by OT       Precautions / Restrictions Precautions Precautions: Fall Restrictions Weight Bearing Restrictions: No       Mobility Bed Mobility Overal bed mobility: Modified Independent             General bed mobility comments: using rail to assist sitting, cues to sequence supine to sit safely and efficiently  Transfers Overall transfer level: Needs assistance Equipment used: Quad cane;1 person hand held assist Transfers: Sit to/from Stand Sit to Stand: Min guard                  ADL either performed or assessed with clinical judgement   ADL Overall ADL's : Needs assistance/impaired     Grooming:  Wash/dry hands;Wash/dry face;Brushing hair;Min guard;Standing Grooming Details (indicate cue type and reason): pt completing tasks using BUE, OT providing min guard for occasional LOB due to no UE support on sink                 Toilet Transfer: Min guard;Ambulation;Regular Toilet(quad cane)   Toileting- Clothing Manipulation and Hygiene: Supervision/safety;Sit to/from stand       Functional mobility during ADLs: Supervision/safety;Min guard(quad cane)       Vision   Vision Assessment?: No apparent visual deficits Additional Comments: continues to report blurred vision          Cognition Arousal/Alertness: Awake/alert Behavior During Therapy: WFL for tasks assessed/performed Overall Cognitive Status: Within Functional Limits for tasks assessed                                          Exercises Exercises: General Upper Extremity;Other exercises General Exercises - Upper Extremity Shoulder Flexion: AROM;10 reps Shoulder Horizontal ABduction: AROM;10 reps Elbow Flexion: AROM;10 reps Elbow Extension: AROM;10 reps Other Exercises Other Exercises: shoulder protraction, A/ROM, 10X Other Exercises: overhead press, A/ROM, 10X           Pertinent Vitals/ Pain       Pain Assessment: 0-10 Pain Score: 4  Pain Location: Low Back Pain Descriptors / Indicators: Aching Pain Intervention(s): Limited activity within patient's  tolerance;Monitored during session;Repositioned     Prior Functioning/Environment              Frequency  Min 2X/week        Progress Toward Goals  OT Goals(current goals can now be found in the care plan section)  Progress towards OT goals: Progressing toward goals  Acute Rehab OT Goals Patient Stated Goal: To go home  OT Goal Formulation: With patient Time For Goal Achievement: 01/11/18 Potential to Achieve Goals: Good ADL Goals Pt Will Perform Grooming: with modified independence;standing Pt Will Perform Lower Body  Dressing: with modified independence;sitting/lateral leans;sit to/from stand Pt Will Transfer to Toilet: with modified independence;ambulating;regular height toilet;bedside commode Pt Will Perform Toileting - Clothing Manipulation and hygiene: with modified independence;sitting/lateral leans;sit to/from stand Pt/caregiver will Perform Home Exercise Program: Increased strength;Right Upper extremity;Independently;With written HEP provided  Plan Discharge plan remains appropriate          End of Session Equipment Utilized During Treatment: Gait belt(quad cane)  OT Visit Diagnosis: Muscle weakness (generalized) (M62.81)   Activity Tolerance Patient tolerated treatment well   Patient Left in chair;with call bell/phone within reach;with chair alarm set   Nurse Communication          Time: [phone] OT Time Calculation (min): 30 min  Charges: OT General Charges $OT Visit: 1 Visit OT Treatments $Self Care/Home Management : 8-22 mins $Therapeutic Exercise: 8-22 mins    Ezra Sites, OTR/L  [phone] 12/30/2017, 8:09 AM

## 2017-12-30 NOTE — Discharge Summary (Addendum)
Physician Discharge Summary  Shannon [name] WUJ:[id number] DOB: 18-Apr-1951 DOA: 12/27/2017  PCP: Nila Nephew, MD  Admit date: 12/27/2017 Discharge date: 12/30/2017  Admitted From: Home Disposition: Skilled nursing facility  Recommendations for Outpatient Follow-up:  1. Follow up with PCP in 1-2 weeks 2. Please obtain BMP/CBC in one week 3. Patient will need surveillance of right carotid artery with approximately 50% stenosis 4. ARB has been discontinued due to renal dysfunction.  Consider resuming as an outpatient if renal function remains stable.  Discharge Condition: Stable CODE STATUS: DNR Diet recommendation: Heart Healthy / Carb Modified  Brief/Interim Summary: 67 year old female with a history of coronary artery disease and history of prior CVAs, on aspirin and Plavix, history of diabetes, presented with dysarthria, right facial droop.  Found to have acute CVA.  Discharge Diagnoses:  Principal Problem:   CVA (cerebral vascular accident) [facility]) Active Problems:   CAD S/P LAD PCI July 2011   Hypertension, essential   IBS (irritable bowel syndrome)   DM type 2 (diabetes mellitus, type 2) (HCC)   Left ankle pain and swelling   H/O ischemic right PCA stroke  1. Acute CVA.    Patient was found to have multiple recurrent cortical small infarcts involving the left hemisphere likely due to occlusion of the left MCA intracranially.  She was seen by neurology and recommendations were for dual antiplatelet therapy along with a statin.  She is been started on Lipitor.  Echocardiogram performed and was unremarkable.  Carotid duplex showed that there may be stenosis in the right internal carotid artery between 50-69%, closer to 50%.  This will need to be followed up as an outpatient with surveillance.  Left-sided showed less than 50% stenosis.  She was seen by physical therapy and speech therapy who recommended skilled nursing facility placement.   2. Acute kidney injury.    ARB was held on  admission and she was given IV fluids.  Overall renal function has improved. We will continue to hold ARB on discharge.  This can be resumed as an outpatient if renal function remains stable. 3. Hypertension.  Continue on metoprolol and Norvasc. 4. Diabetes. Resume Januvia and glimepiride.  Patient was having low blood sugars likely related to renal dysfunction and decreased clearance of oral hypoglycemics.  Now that renal function has improved, will resume home dose of diabetes medications.   5. Hypothyroidism.  Continue on Synthroid 6. IBS.  No further episodes of diarrhea at this time.     Discharge Instructions  Discharge Instructions    Diet - low sodium heart healthy   Complete by:  As directed    Increase activity slowly   Complete by:  As directed      Allergies as of 12/30/2017      Reactions   Insulins Other (See Comments)   Causes severe diarrhea   Sulfa Antibiotics Other (See Comments)   Childhood allergy, can't remember   Lisinopril Cough      Medication List    STOP taking these medications   ibuprofen 200 MG tablet Commonly known as:  ADVIL,MOTRIN   losartan 100 MG tablet Commonly known as:  COZAAR     TAKE these medications   acetaminophen 500 MG tablet Commonly known as:  TYLENOL Take 500 mg by mouth daily as needed for mild pain.   amLODipine 10 MG tablet Commonly known as:  NORVASC Take 10 mg by mouth daily.   aspirin EC 81 MG tablet Take 1 tablet (81 mg total) by mouth  daily.   atorvastatin 40 MG tablet Commonly known as:  LIPITOR Take 1 tablet (40 mg total) by mouth daily at 6 PM.   b complex vitamins tablet Take 1 tablet by mouth daily.   Chromium 200 MCG Caps Take 200 mcg by mouth every 30 (thirty) days.   clopidogrel 75 MG tablet Commonly known as:  PLAVIX TAKE ONE TABLET ONCE DAILY WITH BREAKFAST   co-enzyme Q-10 30 MG capsule Take 30 mg by mouth daily.   escitalopram 10 MG tablet Commonly known as:  LEXAPRO Take 1 tablet by  mouth daily.   gabapentin 400 MG capsule Commonly known as:  NEURONTIN Take 400 mg by mouth 2 (two) times daily.   glimepiride 4 MG tablet Commonly known as:  AMARYL Take 4 mg by mouth 2 (two) times daily.   hydrOXYzine 25 MG tablet Commonly known as:  ATARAX/VISTARIL Take 25 mg by mouth 2 (two) times daily as needed for anxiety.   isosorbide mononitrate 30 MG 24 hr tablet Commonly known as:  IMDUR Take 1 tablet (30 mg total) by mouth daily. DUE APPT IN AUGUST, MAKE APPT FOR FURTHER REFILLS, CALL [phone] TO SCHEDULE What changed:  additional instructions   JANUVIA 100 MG tablet Generic drug:  sitaGLIPtin Take 100 mg by mouth daily.   levothyroxine 25 MCG tablet Commonly known as:  SYNTHROID, LEVOTHROID TAKE 1 TABLET (25 MCG TOTAL) BY MOUTH DAILY BEFORE BREAKFAST.   metoprolol tartrate 25 MG tablet Commonly known as:  LOPRESSOR Take 1-2 tablets (25-50 mg total) by mouth 2 (two) times daily. What changed:  how much to take   multivitamin capsule Take 1 capsule by mouth daily. Senior vitamin   nitroGLYCERIN 0.4 MG SL tablet Commonly known as:  NITROSTAT Place 1 tablet (0.4 mg total) under the tongue every 5 (five) minutes as needed. What changed:  reasons to take this   temazepam 30 MG capsule Commonly known as:  RESTORIL Take 1 capsule (30 mg total) by mouth at bedtime.   Vitamin D-3 1000 units Caps Take 1,000 Units by mouth daily.       Allergies  Allergen Reactions  . Insulins Other (See Comments)    Causes severe diarrhea  . Sulfa Antibiotics Other (See Comments)    Childhood allergy, can't remember  . Lisinopril Cough    Consultations:  Neurology   Procedures/Studies: Dg Chest 2 View  Result Date: 12/27/2017 CLINICAL DATA:  Speech disturbance beginning today. Possible stroke. EXAM: CHEST  2 VIEW COMPARISON:  10/14/2017 FINDINGS: Heart size is normal. Mediastinal shadows are normal. The lungs are clear. The vascularity is normal. No effusions.  Chronic bony degenerative changes noted affecting the spine. Loop recorder again visible. IMPRESSION: No active cardiopulmonary disease. Electronically Signed   By: Paulina FusiMark  Shogry M.D.   On: 12/27/2017 15:10   Dg Ankle 2 Views Left  Result Date: 12/28/2017 CLINICAL DATA:  Generalized left ankle pain starting 1.5 weeks ago after trauma. EXAM: LEFT ANKLE - 2 VIEW COMPARISON:  None. FINDINGS: There is subtle curvilinear lucency in the distal fibula, the patient has focal pain here, nondisplaced fracture is not excluded. There is plantar calcaneal spur. There is no dislocation. IMPRESSION: There is subtle curvilinear lucency in the distal fibula, the patient has focal pain here, nondisplaced fracture is not excluded. Electronically Signed   By: Sherian ReinWei-Chen  Lin M.D.   On: 12/28/2017 19:15   Mr Shirlee LatchMra Head UJWo Contrast  Result Date: 12/28/2017 CLINICAL DATA:  67 y/o F; history of prior strokes. Patient  presents with right facial droop and slurred speech. EXAM: MRI HEAD WITHOUT CONTRAST MRA HEAD WITHOUT CONTRAST TECHNIQUE: Multiplanar, multiecho pulse sequences of the brain and surrounding structures were obtained without intravenous contrast. Angiographic images of the head were obtained using MRA technique without contrast. COMPARISON:  12/27/2017 CT head. 06/10/2017 CT angiogram head. 06/09/2017 MRI head. FINDINGS: MRI HEAD FINDINGS Motion degradation on multiple sequences. Brain: Several small foci of reduced diffusion measuring up to 14 mm are scattered throughout the left lateral frontal lobe compatible with acute/early subacute infarction. Chronic infarctions of right PCA distribution and scattered throughout left frontal and parietal lobes. Chronic lacunar infarcts are present in bilateral basal ganglia. Stable background of chronic microvascular ischemic changes and parenchymal volume loss of the brain. No gross hemorrhage. No hydrocephalus, effacement of basilar cisterns, or extra-axial collection. Vascular: As  below. Skull and upper cervical spine: Normal marrow signal. Sinuses/Orbits: Negative. Other: None. MRA HEAD FINDINGS Severe motion artifact, suboptimal assessment for aneurysm or stenosis. Internal carotid arteries: Patent bilaterally. The left is small in caliber. Anterior cerebral arteries: Proximal left ACA is patent. Faint flow related signal and right A1, no appreciable downstream right ACA. Middle cerebral arteries: Patent right. No appreciable signal in left MCA, possibly due to occlusion or secondary to high-grade stenosis in left M1 as seen on prior CTA. Anterior communicating artery: Motion degraded. Posterior communicating arteries:  Motion degraded. Posterior cerebral arteries: Patent left PCA. Left P1 stenosis. No right PCA identified. Basilar artery:  Patent. Vertebral arteries:  Patent. IMPRESSION: MRI head: Motion degradation of multiple sequences. 1. Several small foci of acute/early subacute infarction within left MCA distribution. No gross hemorrhage or mass effect identified. 2. Stable background of extensive chronic microvascular ischemic changes, chronic infarcts, and volume loss. MRA head: 1. Severe motion artifact. 2. No flow related signal in left MCA, possibly due to occlusion or secondary to high-grade left M1 stenosis as seen on prior CTA. If clinically indicated, patency may be better assessed with CT angiogram. 3. Patent bilateral ICA, right MCA, and left ACA. 4. Faint flow related signal and right A1, no appreciable distal right ACA may be due to small caliber, occlusion, or proximal stenosis. 5. Chronic right PCA occlusion. 6. Left P1 stenosis. These results will be called to the ordering clinician or representative by the Radiologist Assistant, and communication documented in the PACS or zVision Dashboard. Electronically Signed   By: Mitzi Hansen M.D.   On: 12/28/2017 19:10   Mr Brain Wo Contrast  Result Date: 12/28/2017 CLINICAL DATA:  67 y/o F; history of prior  strokes. Patient presents with right facial droop and slurred speech. EXAM: MRI HEAD WITHOUT CONTRAST MRA HEAD WITHOUT CONTRAST TECHNIQUE: Multiplanar, multiecho pulse sequences of the brain and surrounding structures were obtained without intravenous contrast. Angiographic images of the head were obtained using MRA technique without contrast. COMPARISON:  12/27/2017 CT head. 06/10/2017 CT angiogram head. 06/09/2017 MRI head. FINDINGS: MRI HEAD FINDINGS Motion degradation on multiple sequences. Brain: Several small foci of reduced diffusion measuring up to 14 mm are scattered throughout the left lateral frontal lobe compatible with acute/early subacute infarction. Chronic infarctions of right PCA distribution and scattered throughout left frontal and parietal lobes. Chronic lacunar infarcts are present in bilateral basal ganglia. Stable background of chronic microvascular ischemic changes and parenchymal volume loss of the brain. No gross hemorrhage. No hydrocephalus, effacement of basilar cisterns, or extra-axial collection. Vascular: As below. Skull and upper cervical spine: Normal marrow signal. Sinuses/Orbits: Negative. Other: None. MRA HEAD FINDINGS  Severe motion artifact, suboptimal assessment for aneurysm or stenosis. Internal carotid arteries: Patent bilaterally. The left is small in caliber. Anterior cerebral arteries: Proximal left ACA is patent. Faint flow related signal and right A1, no appreciable downstream right ACA. Middle cerebral arteries: Patent right. No appreciable signal in left MCA, possibly due to occlusion or secondary to high-grade stenosis in left M1 as seen on prior CTA. Anterior communicating artery: Motion degraded. Posterior communicating arteries:  Motion degraded. Posterior cerebral arteries: Patent left PCA. Left P1 stenosis. No right PCA identified. Basilar artery:  Patent. Vertebral arteries:  Patent. IMPRESSION: MRI head: Motion degradation of multiple sequences. 1. Several small  foci of acute/early subacute infarction within left MCA distribution. No gross hemorrhage or mass effect identified. 2. Stable background of extensive chronic microvascular ischemic changes, chronic infarcts, and volume loss. MRA head: 1. Severe motion artifact. 2. No flow related signal in left MCA, possibly due to occlusion or secondary to high-grade left M1 stenosis as seen on prior CTA. If clinically indicated, patency may be better assessed with CT angiogram. 3. Patent bilateral ICA, right MCA, and left ACA. 4. Faint flow related signal and right A1, no appreciable distal right ACA may be due to small caliber, occlusion, or proximal stenosis. 5. Chronic right PCA occlusion. 6. Left P1 stenosis. These results will be called to the ordering clinician or representative by the Radiologist Assistant, and communication documented in the PACS or zVision Dashboard. Electronically Signed   By: Mitzi Hansen M.D.   On: 12/28/2017 19:10   US Carotid Bilateral (at [facility] Only)  Result Date: 12/27/2017 CLINICAL DATA:  TIA. EXAM: BILATERAL CAROTID DUPLEX ULTRASOUND TECHNIQUE: Wallace Cullens scale imaging, color Doppler and duplex ultrasound were performed of bilateral carotid and vertebral arteries in the neck. COMPARISON:  06/09/2017 FINDINGS: Criteria: Quantification of carotid stenosis is based on velocity parameters that correlate the residual internal carotid diameter with NASCET-based stenosis levels, using the diameter of the distal internal carotid lumen as the denominator for stenosis measurement. The following velocity measurements were obtained: RIGHT ICA:  145 cm/sec CCA:  91 cm/sec SYSTOLIC ICA/CCA RATIO:  1.6 DIASTOLIC ICA/CCA RATIO:  2.3 ECA:  66 cm/sec LEFT ICA:  50 cm/sec CCA:  60 cm/sec SYSTOLIC ICA/CCA RATIO:  0.8 DIASTOLIC ICA/CCA RATIO:  0.8 ECA:  44 cm/sec RIGHT CAROTID ARTERY: Small amount of plaque at the right carotid bulb. External carotid artery is patent with normal waveform. Small  amount of plaque in the internal carotid artery. Peak systolic velocity in the mid internal carotid artery is mildly elevated measuring 145 cm/sec. Mild tortuosity in the mid internal carotid artery. RIGHT VERTEBRAL ARTERY: Antegrade flow and normal waveform in the right vertebral artery. LEFT CAROTID ARTERY: Calcified plaque in the distal common carotid artery and bulb region. Plaque in the proximal external carotid artery region. External carotid artery is patent with normal waveform. Focal plaque in the proximal internal carotid artery appear stable. Again noted are low velocities in the carotid arteries particularly in the left internal carotid artery. Normal waveforms in left carotid arteries. The low velocities in the internal carotid artery are similar to the previous examination. LEFT VERTEBRAL ARTERY: Antegrade flow and normal waveform in the left vertebral artery. IMPRESSION: Stable carotid artery duplex. Again noted is a minimally elevated peak systolic velocity in the right internal carotid artery. Findings could represent a 50-69% stenosis in the right internal carotid artery, closer to 50%. Again noted is focal plaque in the proximal left internal carotid artery. Estimated  degree of stenosis in left internal carotid artery is less than 50%. Patent vertebral arteries with antegrade flow. Electronically Signed   By: Richarda Overlie M.D.   On: 12/27/2017 17:08   Ct Head Code Stroke Wo Contrast`  Result Date: 12/27/2017 CLINICAL DATA:  Code stroke. Focal neuro deficit less than 6 hours. Right facial droop, slurred speech EXAM: CT HEAD WITHOUT CONTRAST TECHNIQUE: Contiguous axial images were obtained from the base of the skull through the vertex without intravenous contrast. COMPARISON:  CT head 06/10/2017 FINDINGS: Brain: Negative for acute infarct.  Negative for hemorrhage or mass. Chronic right PCA infarct in the right occipital lobe unchanged. Chronic left parietal cortical infarct unchanged. Chronic  infarcts in the white matter bilaterally. Mild atrophy. Vascular: Negative for acute vascular thrombosis Skull: Negative Sinuses/Orbits: Negative Other: None ASPECTS (Alberta Stroke Program Early CT Score) - Ganglionic level infarction (caudate, lentiform nuclei, internal capsule, insula, M1-M3 cortex): 7 - Supraganglionic infarction (M4-M6 cortex): 3 Total score (0-10 with 10 being normal): 10 IMPRESSION: 1. No acute abnormality 2. ASPECTS is 10 3. Extensive chronic ischemic changes stable from the prior study. 4. These results were called by telephone at the time of interpretation on 12/27/2017 at 2:17 pm to Dr. Marily Memos , who verbally acknowledged these results. Electronically Signed   By: Marlan Palau M.D.   On: 12/27/2017 14:17    Echo: - Left ventricle: The cavity size was normal. Wall thickness was   increased in a pattern of mild LVH. Systolic function was normal.   The estimated ejection fraction was in the range of 55% to 60%.   Wall motion was normal; there were no regional wall motion   abnormalities. The study is not technically sufficient to allow   evaluation of LV diastolic function. - Aortic valve: Mildly calcified annulus. Trileaflet; mildly   calcified leaflets. - Mitral valve: Mildly calcified annulus. There was mild   regurgitation. - Left atrium: The atrium was at the upper limits of normal in   size. - Right ventricle: There are prominent and somewhat calcified   trabeculations and muscle bands at the apex. - Right atrium: Echodensity seen inconsistently in right atrium is   consistent with visualization of the thickened atrial septum as   it bows from left to right. Central venous pressure (est): 3 mm   Hg. - Tricuspid valve: There was mild regurgitation. - Pulmonary arteries: Systolic pressure could not be accurately   estimated. - Pericardium, extracardiac: A prominent pericardial fat pad was   present.   Subjective: No chest pain or shortness of  breath.  Discharge Exam: Vitals:   12/30/17 0430 12/30/17 0830  BP: (!) 152/87 (!) 176/79  Pulse: 62 64  Resp: 18   Temp: (!) 97.2 F (36.2 C)   SpO2: 98% 99%   Vitals:   12/29/17 2251 12/30/17 0024 12/30/17 0430 12/30/17 0830  BP: (!) 181/80 (!) 152/97 (!) 152/87 (!) 176/79  Pulse:  61 62 64  Resp:  18 18   Temp:  98 F (36.7 C) (!) 97.2 F (36.2 C)   TempSrc:  Oral Oral   SpO2:  100% 98% 99%  Weight:      Height:        General: Pt is alert, awake, not in acute distress Cardiovascular: RRR, S1/S2 +, no rubs, no gallops Respiratory: CTA bilaterally, no wheezing, no rhonchi Abdominal: Soft, NT, ND, bowel sounds + Extremities: no edema, no cyanosis    The results of significant diagnostics from this  hospitalization (including imaging, microbiology, ancillary and laboratory) are listed below for reference.     Microbiology: No results found for this or any previous visit (from the past 240 hour(s)).   Labs: BNP (last 3 results) No results for input(s): BNP in the last 8760 hours. Basic Metabolic Panel: Recent Labs  Lab 12/27/17 1403 12/27/17 1414 12/28/17 0606 12/29/17 0406 12/30/17 0425  NA 136 135 144 140 141  K 4.1 3.5 3.5 3.5 3.7  CL 102 98* 107 107 107  CO2  --  24 25 24 24   GLUCOSE 257* 246* 95 97 79  BUN 21* 21* 20 20 16   CREATININE 2.70* 2.76* 2.02* 1.52* 1.22*  CALCIUM  --  9.3 9.0 8.5* 8.6*   Liver Function Tests: Recent Labs  Lab 12/27/17 1414 12/29/17 0406  AST 21 19  ALT 16 24  ALKPHOS 86 84  BILITOT 0.7 0.3  PROT 7.0 5.3*  ALBUMIN 3.7 2.9*   No results for input(s): LIPASE, AMYLASE in the last 168 hours. No results for input(s): AMMONIA in the last 168 hours. CBC: Recent Labs  Lab 12/27/17 1403 12/27/17 1414 12/29/17 0406  WBC  --  7.6 6.4  NEUTROABS  --  5.6  --   HGB 15.3* 12.8 10.3*  HCT 45.0 39.0 31.7*  MCV  --  82.5 82.8  PLT  --  224 212   Cardiac Enzymes: No results for input(s): CKTOTAL, CKMB, CKMBINDEX,  TROPONINI in the last 168 hours. BNP: Invalid input(s): POCBNP CBG: Recent Labs  Lab 12/29/17 1107 12/29/17 1615 12/29/17 2048 12/30/17 0730 12/30/17 1124  GLUCAP 84 156* 221* 113* 172*   D-Dimer No results for input(s): DDIMER in the last 72 hours. Hgb A1c Recent Labs    12/28/17 0606  HGBA1C 6.7*   Lipid Profile Recent Labs    12/28/17 0606  CHOL 174  HDL 34*  LDLCALC 115*  TRIG 127  CHOLHDL 5.1   Thyroid function studies No results for input(s): TSH, T4TOTAL, T3FREE, THYROIDAB in the last 72 hours.  Invalid input(s): FREET3 Anemia work up No results for input(s): VITAMINB12, FOLATE, FERRITIN, TIBC, IRON, RETICCTPCT in the last 72 hours. Urinalysis    Component Value Date/Time   COLORURINE STRAW (A) 12/28/2017 0350   APPEARANCEUR CLEAR 12/28/2017 0350   LABSPEC 1.003 (L) 12/28/2017 0350   PHURINE 6.0 12/28/2017 0350   GLUCOSEU NEGATIVE 12/28/2017 0350   HGBUR NEGATIVE 12/28/2017 0350   BILIRUBINUR NEGATIVE 12/28/2017 0350   BILIRUBINUR neg 04/13/2014 1733   KETONESUR NEGATIVE 12/28/2017 0350   PROTEINUR NEGATIVE 12/28/2017 0350   UROBILINOGEN 0.2 04/13/2014 1733   NITRITE NEGATIVE 12/28/2017 0350   LEUKOCYTESUR TRACE (A) 12/28/2017 0350   Sepsis Labs Invalid input(s): PROCALCITONIN,  WBC,  LACTICIDVEN Microbiology No results found for this or any previous visit (from the past 240 hour(s)).   Time coordinating discharge: Over 30 minutes  SIGNED:   Erick Blinks, MD  [facility] 12/30/2017, 2:43 PM Pager   If 7PM-7AM, please contact night-coverage [email or web address] Password [email or web address]

## 2017-12-30 NOTE — Clinical Social Work Placement (Signed)
   CLINICAL SOCIAL WORK PLACEMENT  NOTE  Date:  12/30/2017  Patient Details  Name: Shannon [name] MRN: [id number] Date of Birth: 12/04/1950  Clinical Social Work is seeking post-discharge placement for this patient at the Skilled  Nursing Facility level of care (*CSW will initial, date and re-position this form in  chart as items are completed):  Yes   Patient/family provided with [facility] Social Work Department's list of facilities offering this level of care within the geographic area requested by the patient (or if unable, by the patient's family).  Yes   Patient/family informed of their freedom to choose among providers that offer the needed level of care, that participate in Medicare, Medicaid or managed care program needed by the patient, have an available bed and are willing to accept the patient.  Yes   Patient/family informed of [facility]'s ownership interest [facility] and [facility], as well as of the fact that they are under no obligation to receive care at these facilities.  PASRR submitted to EDS on 12/30/17     PASRR number received on 12/30/17     Existing PASRR number confirmed on       FL2 transmitted to all facilities in geographic area requested by pt/family on 12/30/17     FL2 transmitted to all facilities within larger geographic area on       Patient informed that his/her managed care company has contracts with or will negotiate with certain facilities, including the following:        Yes   Patient/family informed of bed offers received.  [facility]     Physician recommends and patient chooses bed at      [facility] 12/30/17.  [facility]     Patient family notified on 12/30/17 of transfer.  Name of family member notified:  [facility]     PHYSICIAN       Additional Comment:     _______________________________________________ Annice NeedySettle, Aarohi Redditt D, LCSW 12/30/2017, 3:55 PM

## 2017-12-31 ENCOUNTER — Encounter (HOSPITAL_COMMUNITY)
Admission: RE | Admit: 2017-12-31 | Discharge: 2017-12-31 | Disposition: A | Discharge status: Home / Self Care | Payer: Medicare HMO | Source: Skilled Nursing Facility | Attending: Internal Medicine | Admitting: Internal Medicine

## 2017-12-31 ENCOUNTER — Non-Acute Institutional Stay (SKILLED_NURSING_FACILITY): Payer: Medicare HMO | Admitting: Internal Medicine

## 2017-12-31 ENCOUNTER — Inpatient Hospital Stay (HOSPITAL_COMMUNITY): Payer: Medicare HMO | Attending: Internal Medicine

## 2017-12-31 DIAGNOSIS — E118 Type 2 diabetes mellitus with unspecified complications: Secondary | ICD-10-CM

## 2017-12-31 DIAGNOSIS — E032 Hypothyroidism due to medicaments and other exogenous substances: Secondary | ICD-10-CM | POA: Diagnosis not present

## 2017-12-31 DIAGNOSIS — M25572 Pain in left ankle and joints of left foot: Secondary | ICD-10-CM | POA: Diagnosis not present

## 2017-12-31 DIAGNOSIS — I10 Essential (primary) hypertension: Secondary | ICD-10-CM

## 2017-12-31 DIAGNOSIS — E785 Hyperlipidemia, unspecified: Secondary | ICD-10-CM | POA: Insufficient documentation

## 2017-12-31 DIAGNOSIS — I6302 Cerebral infarction due to thrombosis of basilar artery: Secondary | ICD-10-CM | POA: Diagnosis not present

## 2017-12-31 DIAGNOSIS — K580 Irritable bowel syndrome with diarrhea: Secondary | ICD-10-CM

## 2017-12-31 DIAGNOSIS — E119 Type 2 diabetes mellitus without complications: Secondary | ICD-10-CM | POA: Insufficient documentation

## 2017-12-31 DIAGNOSIS — F411 Generalized anxiety disorder: Secondary | ICD-10-CM | POA: Insufficient documentation

## 2017-12-31 DIAGNOSIS — E039 Hypothyroidism, unspecified: Secondary | ICD-10-CM | POA: Insufficient documentation

## 2017-12-31 DIAGNOSIS — F329 Major depressive disorder, single episode, unspecified: Secondary | ICD-10-CM | POA: Insufficient documentation

## 2017-12-31 DIAGNOSIS — I2510 Atherosclerotic heart disease of native coronary artery without angina pectoris: Secondary | ICD-10-CM | POA: Insufficient documentation

## 2017-12-31 LAB — CBC WITH DIFFERENTIAL/PLATELET
Basophils Absolute: 0 10*3/uL (ref 0.0–0.1)
Basophils Relative: 0 %
Eosinophils Absolute: 0.2 10*3/uL (ref 0.0–0.7)
Eosinophils Relative: 4 %
HEMATOCRIT: 33.7 % — AB (ref 36.0–46.0)
HEMOGLOBIN: 11 g/dL — AB (ref 12.0–15.0)
LYMPHS PCT: 35 %
Lymphs Abs: 2.1 10*3/uL (ref 0.7–4.0)
MCH: 26.8 pg (ref 26.0–34.0)
MCHC: 32.6 g/dL (ref 30.0–36.0)
MCV: 82.2 fL (ref 78.0–100.0)
Monocytes Absolute: 0.6 10*3/uL (ref 0.1–1.0)
Monocytes Relative: 10 %
NEUTROS ABS: 3.1 10*3/uL (ref 1.7–7.7)
NEUTROS PCT: 51 %
PLATELETS: 175 10*3/uL (ref 150–400)
RBC: 4.1 MIL/uL (ref 3.87–5.11)
RDW: 12.9 % (ref 11.5–15.5)
WBC: 6.1 10*3/uL (ref 4.0–10.5)

## 2017-12-31 LAB — BASIC METABOLIC PANEL
ANION GAP: 9 (ref 5–15)
BUN: 14 mg/dL (ref 6–20)
CO2: 24 mmol/L (ref 22–32)
Calcium: 8.5 mg/dL — ABNORMAL LOW (ref 8.9–10.3)
Chloride: 106 mmol/L (ref 101–111)
Creatinine, Ser: 1.14 mg/dL — ABNORMAL HIGH (ref 0.44–1.00)
GFR calc Af Amer: 56 mL/min — ABNORMAL LOW (ref >60)
GFR, EST NON AFRICAN AMERICAN: 48 mL/min — AB (ref >60)
Glucose, Bld: 105 mg/dL — ABNORMAL HIGH (ref 65–99)
POTASSIUM: 3.6 mmol/L (ref 3.5–5.1)
Sodium: 139 mmol/L (ref 135–145)

## 2017-12-31 NOTE — Progress Notes (Signed)
This is an acute visit.  Level of care skilled.  Facility is [facility].  Chief complaint-acute visit status post hospitalization for CVA.  History of present illness.  Patient is a pleasant 67 year old female with a history of coronary artery disease as well as prior CVAs as well as type 2 diabetes.  She presented to the ER with dysarthria right facial droop and was found to have an acute CVA.  She was found to have multiple recurrent cortical small infarct involving the left hemisphere thought most likely due to occlusion of the left MCA --she was seen by neurology and they recommended aspirin and Plavix along with a statin.  Echocardiogram was unremarkable carotid duplex showed stenosis in the right internal carotid artery between 50 and 69%-left-sided was less than 50% stenosis.  Therapy recommended continued therapy and skilled nursing.  She also had impaired renal function her arm was held she was given IV fluids and this did improve creatinine today is 1.14.  Regards to hypertension again her arm was held she is on metoprolol and Norvasc systolic is more elevated in the 150s today and this will need to my monitor.  She also is a type II diabetic continues on Januvia-at one point was on Amaryl as well but apparently there was some hypoglycemia in the hospital-this will need to be monitored blood sugar was 95 this morning but 282 at noon-  She also has a history of hypothyroidism is on Synthroid apparently has been on this long-term TSH was 2.303 back in August.  Currently she is resting in bed comfortably does not have any acute complaints vital signs appear to be stable blood pressure is somewhat elevated in the 150's  Past Medical History:  Diagnosis Date  . Anxiety   . Arthritis   . Coronary artery disease    a. s/p PCI with DES to LAD in 2011  (LHC 03/2010: Mid LAD 70%, normal ejection fraction  >> PCI:  2.75 x 15 mm Promus DES to LAD); b. Nuclear Stress Test  05/29/14: No ischemia, EF 59%, Normal; c. NSTEMI 11/2014 >> patient declined LHC b/c of recent CVA  . Depression   . Diabetes mellitus without complication (HCC)   . Dyslipidemia 11/20/2014  . Fibromyalgia 1995  . H/O ischemic right PCA stroke 10/2014   a. Carotid US 10/24/14: No significant ICA stenosis bilaterally  . Hemiparesis and alteration of sensations as late effects of stroke (HCC) 06/05/2015  . Hx of echocardiogram    a. Echocardiogram 10/25/14: Moderate LVH, EF 50-55%, normal wall motion, grade 2 diastolic dysfunction, mild LAE, atrial septal lipomatous hypertrophy  . Hypertension   . Irritable bowel          Past Surgical History:  Procedure Laterality Date  . CHOLECYSTECTOMY    . CORONARY STENT PLACEMENT    . LOOP RECORDER INSERTION N/A 06/23/2017   Procedure: Loop Recorder Insertion;  Surgeon: Duke Salvia, MD;  Location: [facility] INVASIVE CV LAB;  Service: Cardiovascular;  Laterality: N/A;  . orif arm fracture Right    forearm  . TEE WITHOUT CARDIOVERSION N/A 04/12/2017   Procedure: TRANSESOPHAGEAL ECHOCARDIOGRAM (TEE) WITH PROPOFOL;  Surgeon: Jonelle Sidle, MD;  Location: [facility]O [facility];  Service: Cardiovascular;  Laterality: N/A;    Social History:   reports that  has never smoked. she has never used smokeless tobacco. She reports that she does not drink alcohol or use drugs.   Lives alone. Able to perform ADLs  Allergies  Allergen Reactions  . Insulins Other (See Comments)    Causes severe diarrhea  . Sulfa Antibiotics Other (See Comments)    Childhood allergy, can't remember  . Lisinopril Cough         Family History  Problem Relation Age of Onset  . Cancer Mother   . Diabetes Mother   . Hypertension Mother   . Heart failure Mother   . CAD Mother   . Heart attack Mother   . Cancer Father   . Diabetes Father   . Hypertension Father   . Heart failure Father   . CAD Father   . Heart attack Father   . Stroke  Father   . CAD Brother   . Diabetes Brother   . Hypertension Brother   . CAD Sister   . Diabetes Sister   . Hypertension Sister   . Heart attack Sister   . Heart attack Brother   . Stroke Maternal Aunt      General she is not complaining of any fever chills.  Skin does not complain of rashes itching or diaphoresis.  Head ears eyes nose mouth and throat does have some left-sided visual deficits status post CVA.  Is not complain of sore throat.  Respiratory is not complaining of shortness of breath or cough.  Cardiac does not complain of chest pain has scant lower extremity edema.  GI is not complaining of abdominal discomfort nausea vomiting diarrhea constipation.  GU does not complain of dysuria.  Musculoskeletal is not complaining of joint pain currently does move all extremities x4.  Neurologic is not complaining of headache dizziness or syncope does have history of multiple CVAs  Psych does not complain of overt anxiety or depression she is on Lexapro exam.    She is afebrile pulse is 60 respirations of 18 blood pressure 156/70.  In general this is a pleasant elderly female in no distress lying comfortably in bed.  Her skin is warm and dry.  Eyes she does have a history of some left sided visual deficits-pupils appear to be reactive.  Sclera conjunctive are clear.  Oropharynx is clear mucous membranes moist.  .  Chest is clear to auscultation there is no labored breathing.  Heart is regular rate and rhythm without murmur gallop or rub she has scant lower extremity edema.  Her abdomen is soft nontender with active bowel sounds.  Musculoskeletal does move all extremities x4 limited exam since she is in bed she does have some mild left ankle edema with some tenderness to palpation has somewhat of a brawny appearance.  Neurologic is able to move all extremities x4 with some minimal right-sided weakness cranial nerves appear grossly intact her  speech is clear.  Psych she is largely alert and oriented pleasant and appropriate.  Labs. Secondary to occlusion of the left MCA- December 31, 2017.  Sodium 139 potassium 3.6 BUN 14 creatinine 1.14.  WBC 6.1 hemoglobin 11.0 platelets 175.  December 29, 2017.  Albumin 2.9 otherwise liver function tests within normal limits.  December 28, 2017.  LDL was 115.  August 2018 TSH was 2.303.  B12 was 145.  Assessment and plan.  1.  History of CVA- she is on aspirin and Plavix--thought secondary to occlusion of the left MCA-she is on aspirin and Plavix-LDL was 115 she has been started on Lipitor as well- she will need surveillance of right carotid artery with approximately 50% stenosis-.  2.  History of right internal carotid artery stenosis again  this will need follow- has been followed by neurology.  3.  History of acute kidney injury her arm is being held he did receive IV fluids in the hospital this appears to have improved with a creatinine of 1.14 BUN of 14 today will update this next week.  4.-History of hypertension continues on Lopressor and Norvasc at this point will monitor systolic is somewhat elevated today but would like to get more readings before making changes recommendation is possibly restarting ARB if renal function stabilizes.  5.-History of type 2 diabetes on Januvia Amaryl currently is being held blood sugar this morning was 95 it was 282 at noon- it was 161 last night at this point will monitor may restart Amaryl if blood sugar shows stability hemoglobin A1c in hospital was 6.7.  6.  Hypothyroidism she is on Synthroid TSH was 303 in August 2018 will update this next week.  7.  History of irritable bowel syndrome at this point appears to be asymptomatic.  8.  History of depression this appears stable on Lexapro continue to monitor.  9.  History of insomnia she is on Restoril at night.  10.  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  11.  History of a left ankle discomfort with some edema I do note x-ray in the hospital showed a lucency of the distal fibula could not rule out a nondisplaced fracture-will update x-ray.  Of note will update next week  CBC as well as a metabolic panel keep an eye on her renal function also will update a B12 level and vitamin D level since she is on supplementation as well as a TSH.  Addendum updated x-ray did not show evidence of a fracture of the fibula- it did show lateral soft tissue swelling suggesting possible ligament injury-will write an order for PT to take a look at this possible bracing of the area.  [id number]-of note greater than 45 minutes spent assessing patient- reviewing her chart and labs- and coordinating and formulating a plan of care for numerous diagnoses-of note greater than 50% of time spent coordinating a plan of care  .

## 2018-01-01 ENCOUNTER — Encounter: Payer: Self-pay | Admitting: Internal Medicine

## 2018-01-03 ENCOUNTER — Encounter: Payer: Self-pay | Admitting: Internal Medicine

## 2018-01-03 ENCOUNTER — Non-Acute Institutional Stay (SKILLED_NURSING_FACILITY): Payer: Medicare HMO | Admitting: Internal Medicine

## 2018-01-03 DIAGNOSIS — E785 Hyperlipidemia, unspecified: Secondary | ICD-10-CM

## 2018-01-03 DIAGNOSIS — E118 Type 2 diabetes mellitus with unspecified complications: Secondary | ICD-10-CM | POA: Diagnosis not present

## 2018-01-03 DIAGNOSIS — H539 Unspecified visual disturbance: Secondary | ICD-10-CM

## 2018-01-03 DIAGNOSIS — I63411 Cerebral infarction due to embolism of right middle cerebral artery: Secondary | ICD-10-CM | POA: Diagnosis not present

## 2018-01-03 DIAGNOSIS — E032 Hypothyroidism due to medicaments and other exogenous substances: Secondary | ICD-10-CM

## 2018-01-03 DIAGNOSIS — I10 Essential (primary) hypertension: Secondary | ICD-10-CM | POA: Diagnosis not present

## 2018-01-03 NOTE — Progress Notes (Signed)
Provider:  Einar [name] Location:   [facility] Room Number: 159/P Place of Service:  SNF (31)  PCP: Nila Nephew, MD Patient Care Team: Nila Nephew, MD as PCP - General (Internal Medicine)  Extended Emergency Contact Information Primary Emergency Contact: Lamount Cranker of [place] Phone: [phone] Relation: Son Secondary Emergency Contact: Hirth,Sarah Address: [address]          [place], [place] [address] Darden Amber of [place] Home Phone: [phone] Relation: Daughter  Code Status: Full Code Goals of Care: Advanced Directive information Advanced Directives 01/03/2018  Does Patient Have a Medical Advance Directive? Yes  Type of Advance Directive (No Data)  Does patient want to make changes to medical advance directive? No - Patient declined  Copy of Healthcare Power of Attorney in Chart? No - copy requested  Would patient like information on creating a medical advance directive? No - Patient declined  Pre-existing out of facility DNR order (yellow form or pink MOST form) -      Chief Complaint  Patient presents with  . New Admit To SNF    New Admission Visit    HPI: Patient is a 67 y.o. female seen today for admission to SNF for therapy after Staying in Hospital for Multiple Recurrent Cortical infarcts from 02/18-02/21  Patient has h/o CAD s/p Stenting, H/o Multiple CVA on dual therapy, Diabetes Type 2 , HTN, Hyperlipidemia , Depression with Anxiety.  Patient presented to ED with Dysarthria and Facial droop. She had stopped taking her aspirin few weeks ago.  She said she has has recurrent falls also recently. And has felt weak in her Left Leg as she had twisted her Left ankle.   She had MRI and MRA Done . It showed Several small foci of acute/early subacute infarction within left MCA distribution.MRA showed high-grade left M1 stenosis ,Chronic right PCA occlusion.and left P1 Stenosis. Was seen by Neurology and was  recommended to continue Dual therapy for now. She did have loop recorder placed in 08/19 for her multiple embolic Strokes. Patient says she was also started on Keppra for Seizures but she opted not to take it as she is worried about side effects. She takes Gabapentin. Patient does lives by herself and was independent in her ADLS. She does have family which lives close by.  Past Medical History:  Diagnosis Date  . Anxiety   . Arthritis   . Coronary artery disease    a. s/p PCI with DES to LAD in 2011  (LHC 03/2010: Mid LAD 70%, normal ejection fraction  >> PCI:  2.75 x 15 mm Promus DES to LAD); b. Nuclear Stress Test 05/29/14: No ischemia, EF 59%, Normal; c. NSTEMI 11/2014 >> patient declined LHC b/c of recent CVA  . Depression   . Diabetes mellitus without complication (HCC)   . Dyslipidemia 11/20/2014  . Fibromyalgia 1995  . H/O ischemic right PCA stroke 10/2014   a. Carotid US 10/24/14: No significant ICA stenosis bilaterally  . Hemiparesis and alteration of sensations as late effects of stroke (HCC) 06/05/2015  . Hx of echocardiogram    a. Echocardiogram 10/25/14: Moderate LVH, EF 50-55%, normal wall motion, grade 2 diastolic dysfunction, mild LAE, atrial septal lipomatous hypertrophy  . Hypertension   . Irritable bowel    Past Surgical History:  Procedure Laterality Date  . CHOLECYSTECTOMY    . CORONARY STENT PLACEMENT    . LOOP RECORDER INSERTION N/A 06/23/2017   Procedure: Loop Recorder Insertion;  Surgeon: Graciela Husbands,  Salvatore DecentSteven C, MD;  Location: [facility] INVASIVE CV LAB;  Service: Cardiovascular;  Laterality: N/A;  . orif arm fracture Right    forearm  . TEE WITHOUT CARDIOVERSION N/A 04/12/2017   Procedure: TRANSESOPHAGEAL ECHOCARDIOGRAM (TEE) WITH PROPOFOL;  Surgeon: Jonelle SidleMcDowell, Samuel G, MD;  Location: [facility];  Service: Cardiovascular;  Laterality: N/A;    reports that  has never smoked. she has never used smokeless tobacco. She reports that she does not drink alcohol or use drugs. Social  History   Socioeconomic History  . Marital status: Married    Spouse name: Not on file  . Number of children: 4  . Years of education: Not on file  . Highest education level: Not on file  Social Needs  . Financial resource strain: Not on file  . Food insecurity - worry: Not on file  . Food insecurity - inability: Not on file  . Transportation needs - medical: Not on file  . Transportation needs - non-medical: Not on file  Occupational History  . Occupation: retired  Tobacco Use  . Smoking status: Never Smoker  . Smokeless tobacco: Never Used  Substance and Sexual Activity  . Alcohol use: No    Alcohol/week: 0.0 oz  . Drug use: No  . Sexual activity: Not on file  Other Topics Concern  . Not on file  Social History Narrative   Patient is right handed.   Patient drinks about 1 coke daily.   Separated from husband. 03-29-15    Living alone but close to family.      Functional Status Survey:    Family History  Problem Relation Age of Onset  . Cancer Mother   . Diabetes Mother   . Hypertension Mother   . Heart failure Mother   . CAD Mother   . Heart attack Mother   . Cancer Father   . Diabetes Father   . Hypertension Father   . Heart failure Father   . CAD Father   . Heart attack Father   . Stroke Father   . CAD Brother   . Diabetes Brother   . Hypertension Brother   . CAD Sister   . Diabetes Sister   . Hypertension Sister   . Heart attack Sister   . Heart attack Brother   . Stroke Maternal Aunt     Health Maintenance  Topic Date Due  . INFLUENZA VACCINE  01/31/2018 (Originally 06/09/2017)  . FOOT EXAM  01/31/2018 (Originally 05/13/1961)  . MAMMOGRAM  01/31/2018 (Originally 07/23/2010)  . OPHTHALMOLOGY EXAM  01/31/2018 (Originally 05/13/1961)  . URINE MICROALBUMIN  01/31/2018 (Originally 09/26/2011)  . DEXA SCAN  01/31/2018 (Originally 05/12/2014)  . COLONOSCOPY  01/31/2018 (Originally 05/13/1999)  . TETANUS/TDAP  01/31/2018 (Originally 05/12/1968)  . Hepatitis C  Screening  01/31/2018 (Originally 07/05/1951)  . PNA vac Low Risk Adult (1 of 2 - PCV13) 01/31/2018 (Originally 05/12/2014)  . HEMOGLOBIN A1C  06/27/2018    Allergies  Allergen Reactions  . Insulins Other (See Comments)    Causes severe diarrhea  . Sulfa Antibiotics Other (See Comments)    Childhood allergy, can't remember  . Lisinopril Cough    Outpatient Encounter Medications as of 01/03/2018  Medication Sig  . acetaminophen (TYLENOL) 500 MG tablet Take 500 mg by mouth daily as needed for mild pain.  Marland Kitchen. amLODipine (NORVASC) 10 MG tablet Take 10 mg by mouth daily.  Marland Kitchen. aspirin EC 81 MG tablet Take 1 tablet (81 mg total) by mouth daily.  Marland Kitchen. atorvastatin (  LIPITOR) 40 MG tablet Take 1 tablet (40 mg total) by mouth daily at 6 PM.  . b complex vitamins tablet Take 1 tablet by mouth daily.  . Cholecalciferol (VITAMIN D-3) 1000 UNITS CAPS Take 1,000 Units by mouth daily.   . Chromium 200 MCG CAPS Take 200 mcg by mouth every 30 (thirty) days.   . clopidogrel (PLAVIX) 75 MG tablet TAKE ONE TABLET ONCE DAILY WITH BREAKFAST  . co-enzyme Q-10 30 MG capsule Take 30 mg by mouth daily.  Marland Kitchen escitalopram (LEXAPRO) 10 MG tablet Take 1 tablet by mouth daily.  Marland Kitchen gabapentin (NEURONTIN) 400 MG capsule Take 400 mg by mouth 2 (two) times daily.  . isosorbide mononitrate (IMDUR) 30 MG 24 hr tablet Take 1 tablet (30 mg total) by mouth daily. DUE APPT IN AUGUST, MAKE APPT FOR FURTHER REFILLS, CALL [phone] TO SCHEDULE  . JANUVIA 100 MG tablet Take 100 mg by mouth daily.  Marland Kitchen levothyroxine (SYNTHROID, LEVOTHROID) 25 MCG tablet TAKE 1 TABLET (25 MCG TOTAL) BY MOUTH DAILY BEFORE BREAKFAST.  . metoprolol succinate (TOPROL-XL) 25 MG 24 hr tablet Take 2 tablets by mouth once a morning  . metoprolol tartrate (LOPRESSOR) 25 MG tablet Take 1 tablet by mouth once a evening  . Multiple Vitamin (MULTIVITAMIN) capsule Take 1 capsule by mouth daily. Senior vitamin  . nitroGLYCERIN (NITROSTAT) 0.4 MG SL tablet Place 1 tablet (0.4  mg total) under the tongue every 5 (five) minutes as needed.  . nystatin cream (MYCOSTATIN) Apply 1 application topically 2 (two) times daily.  . temazepam (RESTORIL) 30 MG capsule Take 1 capsule (30 mg total) by mouth at bedtime.  . [DISCONTINUED] glimepiride (AMARYL) 4 MG tablet Take 4 mg by mouth 2 (two) times daily.  . [DISCONTINUED] hydrOXYzine (ATARAX/VISTARIL) 25 MG tablet Take 25 mg by mouth 2 (two) times daily as needed for anxiety.   . [DISCONTINUED] isosorbide dinitrate (ISORDIL) 30 MG tablet Take 1 tablet (30 mg total) by mouth every morning.  . [DISCONTINUED] metoprolol tartrate (LOPRESSOR) 25 MG tablet Take 1-2 tablets (25-50 mg total) by mouth 2 (two) times daily.   No facility-administered encounter medications on file as of 01/03/2018.      Review of Systems  Constitutional: Positive for activity change and appetite change.  HENT: Negative.   Eyes: Positive for visual disturbance.  Respiratory: Negative.   Cardiovascular: Negative.   Gastrointestinal: Positive for constipation.  Genitourinary: Negative.   Musculoskeletal: Negative.   Neurological: Positive for speech difficulty and weakness.  Psychiatric/Behavioral: The patient is nervous/anxious.     Vitals:   01/03/18 2150  BP: 119/68  Pulse: 65  Resp: 16  Temp: (!) 97 F (36.1 C)   There is no height or weight on file to calculate BMI. Physical Exam  Constitutional: She is oriented to person, place, and time. She appears well-developed and well-nourished.  HENT:  Head: Normocephalic.  Mouth/Throat: Oropharynx is clear and moist.  Eyes: Pupils are equal, round, and reactive to light.  Neck: Neck supple.  Cardiovascular: Normal rate, regular rhythm and normal heart sounds.  Pulmonary/Chest: Effort normal and breath sounds normal. No respiratory distress. She has no wheezes. She has no rales.  Abdominal: Soft. Bowel sounds are normal. She exhibits no distension. There is no tenderness. There is no rebound.    Musculoskeletal: She exhibits no edema.  Lymphadenopathy:    She has no cervical adenopathy.  Neurological: She is alert and oriented to person, place, and time.  Has mild Right Facial droop. No Focal deficits.  Skin: Skin is warm and dry.  Psychiatric: She has a normal mood and affect. Her behavior is normal. Thought content normal.    Labs reviewed: Basic Metabolic Panel: Recent Labs    04/12/17 1058  12/29/17 0406 12/30/17 0425 12/31/17 0615  NA  --    < > 140 141 139  K  --    < > 3.5 3.7 3.6  CL  --    < > 107 107 106  CO2  --    < > 24 24 24   GLUCOSE  --    < > 97 79 105*  BUN  --    < > 20 16 14   CREATININE  --    < > 1.52* 1.22* 1.14*  CALCIUM  --    < > 8.5* 8.6* 8.5*  MG 1.4*  --   --   --   --    < > = values in this interval not displayed.   Liver Function Tests: Recent Labs    10/14/17 1817 12/27/17 1414 12/29/17 0406  AST 18 21 19   ALT 15 16 24   ALKPHOS 64 86 84  BILITOT 0.8 0.7 0.3  PROT 6.2* 7.0 5.3*  ALBUMIN 3.8 3.7 2.9*   No results for input(s): LIPASE, AMYLASE in the last 8760 hours. No results for input(s): AMMONIA in the last 8760 hours. CBC: Recent Labs    10/14/17 1817  12/27/17 1414 12/29/17 0406 12/31/17 0615  WBC 9.0  --  7.6 6.4 6.1  NEUTROABS 5.7  --  5.6  --  3.1  HGB 13.6   < > 12.8 10.3* 11.0*  HCT 42.2   < > 39.0 31.7* 33.7*  MCV 84.9  --  82.5 82.8 82.2  PLT 183  --  224 212 175   < > = values in this interval not displayed.   Cardiac Enzymes: Recent Labs    06/09/17 0606 06/09/17 1213 06/09/17 1710  TROPONINI <0.03 <0.03 <0.03   BNP: Invalid input(s): POCBNP Lab Results  Component Value Date   HGBA1C 6.7 (H) 12/28/2017   Lab Results  Component Value Date   TSH 2.303 06/09/2017   Lab Results  Component Value Date   VITAMINB12 145 (L) 06/09/2017   No results found for: FOLATE No results found for: IRON, TIBC, FERRITIN  Imaging and Procedures obtained prior to SNF admission: Dg Ankle Complete  Left  Result Date: 12/31/2017 CLINICAL DATA:  EVAL FOR FRACTURE, TWISTING INJURY APPROX 1 WEEK AGO EXAM: LEFT ANKLE COMPLETE - 3+ VIEW COMPARISON:  12/28/2017 FINDINGS: There is no evidence of fracture, dislocation, or joint effusion. Small calcaneal spur at the insertion of the plantar aponeurosis. There is no evidence of arthropathy or other focal bone abnormality. Mild soft tissue swelling over the lateral malleolus. IMPRESSION: 1. Negative for fracture or other acute bone abnormality. 2. Lateral soft tissue swelling suggesting ligamentous injury. Electronically Signed   By: Corlis Leak M.D.   On: 12/31/2017 15:45    Assessment/Plan  Acute CVA Patient now on Dual therapy. She did have Loop recorder placed by Dr Graciela Husbands office in past . Patient has not followed with them. She was suppose to take Keppra  But she does not want to take it and takes Neurontin instead. Her 2 D Echo was Unremarkable RCA stenosis 50-60 %Follow up as outpatient  Will work with Therapy Follow up with Cardiology and Neurology. Acute Renal Insufficiency Her ARB was discontinued in hospital due to increase in Creat Will continue  to hold as BP controlled right now. Hypertension  Controlled on Norvasc and Metoprolol Diabetes On Metformin Amaryl was on hold as she had Hypoglycemia in hospital. Now BS are in 200-300 so start her on Amaryl QD will increase to BID if BS stays stable Continue Accu Check. Continue Januvia Hypothyroidism TSH Normal in 08/18 Continue Synthroid Left ankle Pain Xray is negative D/w Therapy for Brace ot help her with Weight bearing. Blurry vision Ophthalmology Consult Hyperlipidemia On Lipitor Vit B12 insufficiency Not On Supplement Repeat Level and restart  Constipation Start on Colasce Depression and Anxiety Continue on Lexapro and Restoril Her Hydroxyzine was discontinued due to interaction with Lexapro by Pharmacy. Dispositon' Home with her Family.   Family/ staff  Communication:   Labs/tests ordered:

## 2018-01-04 ENCOUNTER — Encounter (HOSPITAL_COMMUNITY)
Admission: RE | Admit: 2018-01-04 | Discharge: 2018-01-04 | Disposition: A | Discharge status: Home / Self Care | Payer: Medicare HMO | Source: Skilled Nursing Facility | Attending: *Deleted | Admitting: *Deleted

## 2018-01-04 LAB — CBC WITH DIFFERENTIAL/PLATELET
Basophils Absolute: 0 10*3/uL (ref 0.0–0.1)
Basophils Relative: 0 %
EOS ABS: 0.4 10*3/uL (ref 0.0–0.7)
Eosinophils Relative: 4 %
HCT: 37.7 % (ref 36.0–46.0)
HEMOGLOBIN: 12.2 g/dL (ref 12.0–15.0)
LYMPHS ABS: 2.7 10*3/uL (ref 0.7–4.0)
Lymphocytes Relative: 33 %
MCH: 26.8 pg (ref 26.0–34.0)
MCHC: 32.4 g/dL (ref 30.0–36.0)
MCV: 82.9 fL (ref 78.0–100.0)
Monocytes Absolute: 0.6 10*3/uL (ref 0.1–1.0)
Monocytes Relative: 7 %
NEUTROS PCT: 56 %
Neutro Abs: 4.7 10*3/uL (ref 1.7–7.7)
Platelets: 194 10*3/uL (ref 150–400)
RBC: 4.55 MIL/uL (ref 3.87–5.11)
RDW: 13.1 % (ref 11.5–15.5)
WBC: 8.4 10*3/uL (ref 4.0–10.5)

## 2018-01-04 LAB — TSH: TSH: 4.053 u[IU]/mL (ref 0.350–4.500)

## 2018-01-04 LAB — BASIC METABOLIC PANEL
ANION GAP: 9 (ref 5–15)
BUN: 19 mg/dL (ref 6–20)
CO2: 26 mmol/L (ref 22–32)
Calcium: 8.9 mg/dL (ref 8.9–10.3)
Chloride: 104 mmol/L (ref 101–111)
Creatinine, Ser: 1.04 mg/dL — ABNORMAL HIGH (ref 0.44–1.00)
GFR calc Af Amer: >60 mL/min (ref >60)
GFR, EST NON AFRICAN AMERICAN: 54 mL/min — AB (ref >60)
Glucose, Bld: 83 mg/dL (ref 65–99)
Potassium: 3.9 mmol/L (ref 3.5–5.1)
SODIUM: 139 mmol/L (ref 135–145)

## 2018-01-04 LAB — LIPID PANEL
Cholesterol: 107 mg/dL (ref 0–200)
HDL: 35 mg/dL — ABNORMAL LOW (ref >40)
LDL CALC: 57 mg/dL (ref 0–99)
Total CHOL/HDL Ratio: 3.1 RATIO
Triglycerides: 77 mg/dL (ref <150)
VLDL: 15 mg/dL (ref 0–40)

## 2018-01-04 LAB — VITAMIN B12: VITAMIN B 12: 217 pg/mL (ref 180–914)

## 2018-01-05 ENCOUNTER — Other Ambulatory Visit: Payer: Self-pay

## 2018-01-05 LAB — VITAMIN D 25 HYDROXY (VIT D DEFICIENCY, FRACTURES): Vit D, 25-Hydroxy: 30.6 ng/mL (ref 30.0–100.0)

## 2018-01-05 MED ORDER — TEMAZEPAM 30 MG PO CAPS: 30.0000 mg | ORAL_CAPSULE | Freq: Every day | ORAL | 0 refills | Status: AC

## 2018-01-05 NOTE — Telephone Encounter (Signed)
RX Fax for [facility]@ [phone]  

## 2018-01-10 ENCOUNTER — Encounter (HOSPITAL_COMMUNITY)
Admission: RE | Admit: 2018-01-10 | Discharge: 2018-01-10 | Disposition: A | Discharge status: Home / Self Care | Payer: Medicare HMO | Source: Skilled Nursing Facility | Attending: Internal Medicine | Admitting: Internal Medicine

## 2018-01-10 DIAGNOSIS — F329 Major depressive disorder, single episode, unspecified: Secondary | ICD-10-CM | POA: Insufficient documentation

## 2018-01-10 DIAGNOSIS — E119 Type 2 diabetes mellitus without complications: Secondary | ICD-10-CM | POA: Insufficient documentation

## 2018-01-10 DIAGNOSIS — I10 Essential (primary) hypertension: Secondary | ICD-10-CM | POA: Insufficient documentation

## 2018-01-10 DIAGNOSIS — E039 Hypothyroidism, unspecified: Secondary | ICD-10-CM | POA: Insufficient documentation

## 2018-01-10 DIAGNOSIS — E785 Hyperlipidemia, unspecified: Secondary | ICD-10-CM | POA: Insufficient documentation

## 2018-01-10 DIAGNOSIS — F411 Generalized anxiety disorder: Secondary | ICD-10-CM | POA: Insufficient documentation

## 2018-01-10 DIAGNOSIS — I2510 Atherosclerotic heart disease of native coronary artery without angina pectoris: Secondary | ICD-10-CM | POA: Insufficient documentation

## 2018-01-10 LAB — BASIC METABOLIC PANEL
Anion gap: 10 (ref 5–15)
BUN: 19 mg/dL (ref 6–20)
CALCIUM: 9.3 mg/dL (ref 8.9–10.3)
CO2: 26 mmol/L (ref 22–32)
CREATININE: 0.99 mg/dL (ref 0.44–1.00)
Chloride: 101 mmol/L (ref 101–111)
GFR calc Af Amer: >60 mL/min (ref >60)
GFR calc non Af Amer: 57 mL/min — ABNORMAL LOW (ref >60)
GLUCOSE: 100 mg/dL — AB (ref 65–99)
Potassium: 4.3 mmol/L (ref 3.5–5.1)
Sodium: 137 mmol/L (ref 135–145)

## 2018-01-10 LAB — CBC
HCT: 38 % (ref 36.0–46.0)
Hemoglobin: 12.4 g/dL (ref 12.0–15.0)
MCH: 27.1 pg (ref 26.0–34.0)
MCHC: 32.6 g/dL (ref 30.0–36.0)
MCV: 83 fL (ref 78.0–100.0)
PLATELETS: 198 10*3/uL (ref 150–400)
RBC: 4.58 MIL/uL (ref 3.87–5.11)
RDW: 13.6 % (ref 11.5–15.5)
WBC: 8.4 10*3/uL (ref 4.0–10.5)

## 2018-01-10 LAB — VITAMIN B12: Vitamin B-12: 174 pg/mL — ABNORMAL LOW (ref 180–914)

## 2018-01-12 ENCOUNTER — Telehealth: Payer: Self-pay | Admitting: Cardiology

## 2018-01-12 NOTE — Telephone Encounter (Signed)
[facility] requesting that pt send manual transmission b/c home monitor has not updated in at least 14 days.    

## 2018-01-14 ENCOUNTER — Non-Acute Institutional Stay (SKILLED_NURSING_FACILITY): Payer: Medicare HMO | Admitting: Internal Medicine

## 2018-01-14 ENCOUNTER — Encounter: Payer: Self-pay | Admitting: Internal Medicine

## 2018-01-14 DIAGNOSIS — E118 Type 2 diabetes mellitus with unspecified complications: Secondary | ICD-10-CM

## 2018-01-14 DIAGNOSIS — E785 Hyperlipidemia, unspecified: Secondary | ICD-10-CM | POA: Diagnosis not present

## 2018-01-14 DIAGNOSIS — R531 Weakness: Secondary | ICD-10-CM | POA: Diagnosis not present

## 2018-01-14 DIAGNOSIS — I63411 Cerebral infarction due to embolism of right middle cerebral artery: Secondary | ICD-10-CM | POA: Diagnosis not present

## 2018-01-14 DIAGNOSIS — I10 Essential (primary) hypertension: Secondary | ICD-10-CM | POA: Diagnosis not present

## 2018-01-14 DIAGNOSIS — M25572 Pain in left ankle and joints of left foot: Secondary | ICD-10-CM | POA: Diagnosis not present

## 2018-01-14 NOTE — Progress Notes (Signed)
Location:   [facility] Room Number: 159/P Place of Service:  SNF (31)  Provider: Edmon Crape  PCP: Nila Nephew, MD Patient Care Team: Nila Nephew, MD as PCP - General (Internal Medicine)  Extended Emergency Contact Information Primary Emergency Contact: Lamount Cranker of [place] Phone: [phone] Relation: Son Secondary Emergency Contact: Woolman,Sarah Address: [address]          [place], [place] [address] Darden Amber of [place] Home Phone: [phone] Relation: Daughter  Code Status: Full Code Goals of care:  Advanced Directive information Advanced Directives 01/14/2018  Does Patient Have a Medical Advance Directive? Yes  Type of Advance Directive (No Data)  Does patient want to make changes to medical advance directive? No - Patient declined  Copy of Healthcare Power of Attorney in Chart? No - copy requested  Would patient like information on creating a medical advance directive? No - Patient declined  Pre-existing out of facility DNR order (yellow form or pink MOST form) -     Allergies  Allergen Reactions  . Insulins Other (See Comments)    Causes severe diarrhea  . Sulfa Antibiotics Other (See Comments)    Childhood allergy, can't remember  . Lisinopril Cough    Chief Complaint  Patient presents with  . Discharge Note    Discharge Visit    HPI:  67 y.o. female seen today for discharge from facility this weekend.  Patient was recently hospitalized for multiple recurrent cortical infarcts.  She also has a history of coronary artery disease with stenting as well as prior history of multiple CVAs she is on Plavix and aspirin-she also a type II diabetic and has hypertension hyperlipidemia depression with anxiety.  She presented to the hospital with dysarthria and facial droop- apparently she had not been taking her aspirin.  She also had apparently recurrent falls and felt weak in her left leg.  And did twist  her left ankle-x-rays did not really show any acute issue and this has become more comfortable for her.  Her MRI and MRA showed several small foci of acute to early subacute infarctions within the left MCA distribution-MRA showed high-grade left M1 stenosis and chronic right PCA occlusion and left P1 stenosis.  Neurology did see her and recommended to continue Plavix and aspirin.  She also had a loop recorder placed for multiple embolic strokes.  She apparently at one point was started on Keppra for seizures but she has chosen not to take it she is worried about side effects she does take Neurontin.  She previously lived by herself and was independent in her ADLs.  Apparently her original intention was to go home by herself but per discussion with her today she is open to being with her son at least for a while and her family has been trying to encourage this or an assisted living facility  Currently she has no complaints vital signs appear to be stable regards to diabetes she is on Januvia as well as low-dose Amaryl in the morning- blood sugars in the morning the last few days have run in the lower 100s previously had seen some readings in the 80s or 90s--baseline appears to be more in the lower 100s.  Later in the day readings are somewhat more elevated ranging from the mid 100s to occasional lower 200s.  .  She also has a history of hypothyroidism TSH was within normal limits when taken in August 2018.  Regards to hypertension she is on  Norvasc and Lopressor and this appears to be stable.  She also has some history of acute renal insufficiency appears stabilized as well-creatinine on lab done on January 10, 2018 was 0.99 with BUN of 19.  Of note she apparently at one point had been on B12 we did do a level which is somewhat low at 174 will start her on B12 supplementation upon discharge.      Past Medical History:  Diagnosis Date  . Anxiety   . Arthritis   . Coronary artery  disease    a. s/p PCI with DES to LAD in 2011  (LHC 03/2010: Mid LAD 70%, normal ejection fraction  >> PCI:  2.75 x 15 mm Promus DES to LAD); b. Nuclear Stress Test 05/29/14: No ischemia, EF 59%, Normal; c. NSTEMI 11/2014 >> patient declined LHC b/c of recent CVA  . Depression   . Diabetes mellitus without complication (HCC)   . Dyslipidemia 11/20/2014  . Fibromyalgia 1995  . H/O ischemic right PCA stroke 10/2014   a. Carotid US 10/24/14: No significant ICA stenosis bilaterally  . Hemiparesis and alteration of sensations as late effects of stroke (HCC) 06/05/2015  . Hx of echocardiogram    a. Echocardiogram 10/25/14: Moderate LVH, EF 50-55%, normal wall motion, grade 2 diastolic dysfunction, mild LAE, atrial septal lipomatous hypertrophy  . Hypertension   . Irritable bowel     Past Surgical History:  Procedure Laterality Date  . CHOLECYSTECTOMY    . CORONARY STENT PLACEMENT    . LOOP RECORDER INSERTION N/A 06/23/2017   Procedure: Loop Recorder Insertion;  Surgeon: Duke Salvia, MD;  Location: [facility] INVASIVE CV LAB;  Service: Cardiovascular;  Laterality: N/A;  . orif arm fracture Right    forearm  . TEE WITHOUT CARDIOVERSION N/A 04/12/2017   Procedure: TRANSESOPHAGEAL ECHOCARDIOGRAM (TEE) WITH PROPOFOL;  Surgeon: Jonelle Sidle, MD;  Location: [facility] ENDO SUITE;  Service: Cardiovascular;  Laterality: N/A;      reports that  has never smoked. she has never used smokeless tobacco. She reports that she does not drink alcohol or use drugs. Social History   Socioeconomic History  . Marital status: Married    Spouse name: Not on file  . Number of children: 4  . Years of education: Not on file  . Highest education level: Not on file  Social Needs  . Financial resource strain: Not on file  . Food insecurity - worry: Not on file  . Food insecurity - inability: Not on file  . Transportation needs - medical: Not on file  . Transportation needs - non-medical: Not on file  Occupational History    . Occupation: retired  Tobacco Use  . Smoking status: Never Smoker  . Smokeless tobacco: Never Used  Substance and Sexual Activity  . Alcohol use: No    Alcohol/week: 0.0 oz  . Drug use: No  . Sexual activity: Not on file  Other Topics Concern  . Not on file  Social History Narrative   Patient is right handed.   Patient drinks about 1 coke daily.   Separated from husband. 03-29-15    Living alone but close to family.     Functional Status Survey:    Allergies  Allergen Reactions  . Insulins Other (See Comments)    Causes severe diarrhea  . Sulfa Antibiotics Other (See Comments)    Childhood allergy, can't remember  . Lisinopril Cough    Pertinent  Health Maintenance Due  Topic Date Due  . INFLUENZA  VACCINE  01/31/2018 (Originally 06/09/2017)  . FOOT EXAM  01/31/2018 (Originally 05/13/1961)  . MAMMOGRAM  01/31/2018 (Originally 07/23/2010)  . OPHTHALMOLOGY EXAM  01/31/2018 (Originally 05/13/1961)  . URINE MICROALBUMIN  01/31/2018 (Originally 09/26/2011)  . DEXA SCAN  01/31/2018 (Originally 05/12/2014)  . COLONOSCOPY  01/31/2018 (Originally 05/13/1999)  . PNA vac Low Risk Adult (1 of 2 - PCV13) 01/31/2018 (Originally 05/12/2014)  . HEMOGLOBIN A1C  06/27/2018    Medications: Outpatient Encounter Medications as of 01/14/2018  Medication Sig  . acetaminophen (TYLENOL) 500 MG tablet Take 500 mg by mouth daily as needed for mild pain.  Marland Kitchen amLODipine (NORVASC) 10 MG tablet Take 10 mg by mouth daily.  Marland Kitchen aspirin EC 81 MG tablet Take 1 tablet (81 mg total) by mouth daily.  Marland Kitchen atorvastatin (LIPITOR) 40 MG tablet Take 1 tablet (40 mg total) by mouth daily at 6 PM.  . b complex vitamins tablet Take 1 tablet by mouth daily.  . Cholecalciferol (VITAMIN D-3) 1000 UNITS CAPS Take 1,000 Units by mouth daily.   . Chromium 200 MCG CAPS Take 200 mcg by mouth every 30 (thirty) days.   . clopidogrel (PLAVIX) 75 MG tablet TAKE ONE TABLET ONCE DAILY WITH BREAKFAST  . co-enzyme Q-10 30 MG capsule Take 30 mg  by mouth daily.  Marland Kitchen docusate sodium (COLACE) 100 MG capsule Take 100 mg by mouth at bedtime.  Marland Kitchen escitalopram (LEXAPRO) 10 MG tablet Take 1 tablet by mouth daily.  Marland Kitchen gabapentin (NEURONTIN) 400 MG capsule Take 400 mg by mouth 2 (two) times daily.  Marland Kitchen glimepiride (AMARYL) 2 MG tablet Take 2 mg by mouth daily with breakfast.  . isosorbide mononitrate (IMDUR) 30 MG 24 hr tablet Take 1 tablet (30 mg total) by mouth daily. DUE APPT IN AUGUST, MAKE APPT FOR FURTHER REFILLS, CALL [phone] TO SCHEDULE  . JANUVIA 100 MG tablet Take 100 mg by mouth daily.  Marland Kitchen levothyroxine (SYNTHROID, LEVOTHROID) 25 MCG tablet TAKE 1 TABLET (25 MCG TOTAL) BY MOUTH DAILY BEFORE BREAKFAST.  . metoprolol succinate (TOPROL-XL) 25 MG 24 hr tablet Take 2 tablets by mouth once a morning  . metoprolol tartrate (LOPRESSOR) 25 MG tablet Take 1 tablet by mouth once a evening  . Multiple Vitamin (MULTIVITAMIN) capsule Take 1 capsule by mouth daily. Senior vitamin  . nitroGLYCERIN (NITROSTAT) 0.4 MG SL tablet Place 1 tablet (0.4 mg total) under the tongue every 5 (five) minutes as needed.  . nystatin cream (MYCOSTATIN) Apply 1 application topically 2 (two) times daily.  . temazepam (RESTORIL) 30 MG capsule Take 1 capsule (30 mg total) by mouth at bedtime.  . [DISCONTINUED] isosorbide dinitrate (ISORDIL) 30 MG tablet Take 1 tablet (30 mg total) by mouth every morning.   No facility-administered encounter medications on file as of 01/14/2018.      Review of Systems   In general she is not complaining of any fever chills says she feels relatively well.  Skin does not complain of rashes or itching.  Head ears eyes nose mouth and throat is not complaining of sore throat or acute visual changes- says at times she does have some blurring of her eyes which apparently ihasen going on for a while  Respiratory is not complaining of shortness of breath or cough.  Cardiac denies chest pain or significant lower extremity edema.  GI is not  complaining of nausea vomiting diarrhea at one point complained of constipation but is not complaining of that today.  GU is not complaining of dysuria.  Musculoskeletal does  not complain of joint pain does continue to have some weakness.  Neurologic does not complain of headache dizziness or focal weaknesses but does have some lower extremity weakness and would benefit from PT and OT.  Psych she appears alert and oriented pleasant and appropriate.   Temperature is 98.3 pulse is 62 respirations 16 blood pressure 120/74   Physical Exam  In general this is a pleasant elderly female in no distress.  Her skin is warm and dry.  Eyes pupils appear to be reactive light sclera and conjunctive are clear she has prescription lenses visual acuity appears grossly intact although she does complain of some blurriness at times-.  And has some history of left-sided visual deficits  Oropharynx is clear mucous membranes moist  Chest is clear to auscultation there is no labored breathing.  Heart is regular rate and rhythm without murmur gallop or rub she has fairly minimal lower extremity edema.  Her abdomen soft nontender with positive bowel sounds.  Musculoskeletal moves all her extremities x4 she is able to stand without assistance and use a walker but would benefit from PT and OT since there is still some continued weakness--could not really appreciate lateralizing findings.  Neurologic his speech is clear she does not really have true lateralizing findings does have a minimal right facial droop.  Psych she is alert and oriented pleasant and appropriate   Labs reviewed: Basic Metabolic Panel: Recent Labs    04/12/17 1058  12/31/17 0615 01/04/18 0800 01/10/18 0730  NA  --    < > 139 139 137  K  --    < > 3.6 3.9 4.3  CL  --    < > 106 104 101  CO2  --    < > 24 26 26   GLUCOSE  --    < > 105* 83 100*  BUN  --    < > 14 19 19   CREATININE  --    < > 1.14* 1.04* 0.99  CALCIUM  --    <  > 8.5* 8.9 9.3  MG 1.4*  --   --   --   --    < > = values in this interval not displayed.   Liver Function Tests: Recent Labs    10/14/17 1817 12/27/17 1414 12/29/17 0406  AST 18 21 19   ALT 15 16 24   ALKPHOS 64 86 84  BILITOT 0.8 0.7 0.3  PROT 6.2* 7.0 5.3*  ALBUMIN 3.8 3.7 2.9*   No results for input(s): LIPASE, AMYLASE in the last 8760 hours. No results for input(s): AMMONIA in the last 8760 hours. CBC: Recent Labs    12/27/17 1414  12/31/17 0615 01/04/18 0800 01/10/18 0730  WBC 7.6   < > 6.1 8.4 8.4  NEUTROABS 5.6  --  3.1 4.7  --   HGB 12.8   < > 11.0* 12.2 12.4  HCT 39.0   < > 33.7* 37.7 38.0  MCV 82.5   < > 82.2 82.9 83.0  PLT 224   < > 175 194 198   < > = values in this interval not displayed.   Cardiac Enzymes: Recent Labs    06/09/17 0606 06/09/17 1213 06/09/17 1710  TROPONINI <0.03 <0.03 <0.03   BNP: Invalid input(s): POCBNP CBG: Recent Labs    12/30/17 0730 12/30/17 1124 12/30/17 1606  GLUCAP 113* 172* 135*    Procedures and Imaging Studies During Stay: Dg Chest 2 View  Result Date: 12/27/2017 CLINICAL DATA:  Speech  disturbance beginning today. Possible stroke. EXAM: CHEST  2 VIEW COMPARISON:  10/14/2017 FINDINGS: Heart size is normal. Mediastinal shadows are normal. The lungs are clear. The vascularity is normal. No effusions. Chronic bony degenerative changes noted affecting the spine. Loop recorder again visible. IMPRESSION: No active cardiopulmonary disease. Electronically Signed   By: Paulina Fusi M.D.   On: 12/27/2017 15:10   Dg Ankle 2 Views Left  Result Date: 12/28/2017 CLINICAL DATA:  Generalized left ankle pain starting 1.5 weeks ago after trauma. EXAM: LEFT ANKLE - 2 VIEW COMPARISON:  None. FINDINGS: There is subtle curvilinear lucency in the distal fibula, the patient has focal pain here, nondisplaced fracture is not excluded. There is plantar calcaneal spur. There is no dislocation. IMPRESSION: There is subtle curvilinear lucency in  the distal fibula, the patient has focal pain here, nondisplaced fracture is not excluded. Electronically Signed   By: Sherian Rein M.D.   On: 12/28/2017 19:15   Dg Ankle Complete Left  Result Date: 12/31/2017 CLINICAL DATA:  EVAL FOR FRACTURE, TWISTING INJURY APPROX 1 WEEK AGO EXAM: LEFT ANKLE COMPLETE - 3+ VIEW COMPARISON:  12/28/2017 FINDINGS: There is no evidence of fracture, dislocation, or joint effusion. Small calcaneal spur at the insertion of the plantar aponeurosis. There is no evidence of arthropathy or other focal bone abnormality. Mild soft tissue swelling over the lateral malleolus. IMPRESSION: 1. Negative for fracture or other acute bone abnormality. 2. Lateral soft tissue swelling suggesting ligamentous injury. Electronically Signed   By: Corlis Leak M.D.   On: 12/31/2017 15:45   Mr [name] Head Wo Contrast  Result Date: 12/28/2017 CLINICAL DATA:  67 y/o F; history of prior strokes. Patient presents with right facial droop and slurred speech. EXAM: MRI HEAD WITHOUT CONTRAST MRA HEAD WITHOUT CONTRAST TECHNIQUE: Multiplanar, multiecho pulse sequences of the brain and surrounding structures were obtained without intravenous contrast. Angiographic images of the head were obtained using MRA technique without contrast. COMPARISON:  12/27/2017 CT head. 06/10/2017 CT angiogram head. 06/09/2017 MRI head. FINDINGS: MRI HEAD FINDINGS Motion degradation on multiple sequences. Brain: Several small foci of reduced diffusion measuring up to 14 mm are scattered throughout the left lateral frontal lobe compatible with acute/early subacute infarction. Chronic infarctions of right PCA distribution and scattered throughout left frontal and parietal lobes. Chronic lacunar infarcts are present in bilateral basal ganglia. Stable background of chronic microvascular ischemic changes and parenchymal volume loss of the brain. No gross hemorrhage. No hydrocephalus, effacement of basilar cisterns, or extra-axial collection.  Vascular: As below. Skull and upper cervical spine: Normal marrow signal. Sinuses/Orbits: Negative. Other: None. MRA HEAD FINDINGS Severe motion artifact, suboptimal assessment for aneurysm or stenosis. Internal carotid arteries: Patent bilaterally. The left is small in caliber. Anterior cerebral arteries: Proximal left ACA is patent. Faint flow related signal and right A1, no appreciable downstream right ACA. Middle cerebral arteries: Patent right. No appreciable signal in left MCA, possibly due to occlusion or secondary to high-grade stenosis in left M1 as seen on prior CTA. Anterior communicating artery: Motion degraded. Posterior communicating arteries:  Motion degraded. Posterior cerebral arteries: Patent left PCA. Left P1 stenosis. No right PCA identified. Basilar artery:  Patent. Vertebral arteries:  Patent. IMPRESSION: MRI head: Motion degradation of multiple sequences. 1. Several small foci of acute/early subacute infarction within left MCA distribution. No gross hemorrhage or mass effect identified. 2. Stable background of extensive chronic microvascular ischemic changes, chronic infarcts, and volume loss. MRA head: 1. Severe motion artifact. 2. No flow related signal in  left MCA, possibly due to occlusion or secondary to high-grade left M1 stenosis as seen on prior CTA. If clinically indicated, patency may be better assessed with CT angiogram. 3. Patent bilateral ICA, right MCA, and left ACA. 4. Faint flow related signal and right A1, no appreciable distal right ACA may be due to small caliber, occlusion, or proximal stenosis. 5. Chronic right PCA occlusion. 6. Left P1 stenosis. These results will be called to the ordering clinician or representative by the Radiologist Assistant, and communication documented in the PACS or zVision Dashboard. Electronically Signed   By: Mitzi Hansen M.D.   On: 12/28/2017 19:10   Mr Brain Wo Contrast  Result Date: 12/28/2017 CLINICAL DATA:  67 y/o F; history  of prior strokes. Patient presents with right facial droop and slurred speech. EXAM: MRI HEAD WITHOUT CONTRAST MRA HEAD WITHOUT CONTRAST TECHNIQUE: Multiplanar, multiecho pulse sequences of the brain and surrounding structures were obtained without intravenous contrast. Angiographic images of the head were obtained using MRA technique without contrast. COMPARISON:  12/27/2017 CT head. 06/10/2017 CT angiogram head. 06/09/2017 MRI head. FINDINGS: MRI HEAD FINDINGS Motion degradation on multiple sequences. Brain: Several small foci of reduced diffusion measuring up to 14 mm are scattered throughout the left lateral frontal lobe compatible with acute/early subacute infarction. Chronic infarctions of right PCA distribution and scattered throughout left frontal and parietal lobes. Chronic lacunar infarcts are present in bilateral basal ganglia. Stable background of chronic microvascular ischemic changes and parenchymal volume loss of the brain. No gross hemorrhage. No hydrocephalus, effacement of basilar cisterns, or extra-axial collection. Vascular: As below. Skull and upper cervical spine: Normal marrow signal. Sinuses/Orbits: Negative. Other: None. MRA HEAD FINDINGS Severe motion artifact, suboptimal assessment for aneurysm or stenosis. Internal carotid arteries: Patent bilaterally. The left is small in caliber. Anterior cerebral arteries: Proximal left ACA is patent. Faint flow related signal and right A1, no appreciable downstream right ACA. Middle cerebral arteries: Patent right. No appreciable signal in left MCA, possibly due to occlusion or secondary to high-grade stenosis in left M1 as seen on prior CTA. Anterior communicating artery: Motion degraded. Posterior communicating arteries:  Motion degraded. Posterior cerebral arteries: Patent left PCA. Left P1 stenosis. No right PCA identified. Basilar artery:  Patent. Vertebral arteries:  Patent. IMPRESSION: MRI head: Motion degradation of multiple sequences. 1.  Several small foci of acute/early subacute infarction within left MCA distribution. No gross hemorrhage or mass effect identified. 2. Stable background of extensive chronic microvascular ischemic changes, chronic infarcts, and volume loss. MRA head: 1. Severe motion artifact. 2. No flow related signal in left MCA, possibly due to occlusion or secondary to high-grade left M1 stenosis as seen on prior CTA. If clinically indicated, patency may be better assessed with CT angiogram. 3. Patent bilateral ICA, right MCA, and left ACA. 4. Faint flow related signal and right A1, no appreciable distal right ACA may be due to small caliber, occlusion, or proximal stenosis. 5. Chronic right PCA occlusion. 6. Left P1 stenosis. These results will be called to the ordering clinician or representative by the Radiologist Assistant, and communication documented in the PACS or zVision Dashboard. Electronically Signed   By: Mitzi Hansen M.D.   On: 12/28/2017 19:10   US Carotid Bilateral (at [facility] Only)  Result Date: 12/27/2017 CLINICAL DATA:  TIA. EXAM: BILATERAL CAROTID DUPLEX ULTRASOUND TECHNIQUE: Wallace Cullens scale imaging, color Doppler and duplex ultrasound were performed of bilateral carotid and vertebral arteries in the neck. COMPARISON:  06/09/2017 FINDINGS: Criteria: Quantification of  carotid stenosis is based on velocity parameters that correlate the residual internal carotid diameter with NASCET-based stenosis levels, using the diameter of the distal internal carotid lumen as the denominator for stenosis measurement. The following velocity measurements were obtained: RIGHT ICA:  145 cm/sec CCA:  91 cm/sec SYSTOLIC ICA/CCA RATIO:  1.6 DIASTOLIC ICA/CCA RATIO:  2.3 ECA:  66 cm/sec LEFT ICA:  50 cm/sec CCA:  60 cm/sec SYSTOLIC ICA/CCA RATIO:  0.8 DIASTOLIC ICA/CCA RATIO:  0.8 ECA:  44 cm/sec RIGHT CAROTID ARTERY: Small amount of plaque at the right carotid bulb. External carotid artery is patent with normal  waveform. Small amount of plaque in the internal carotid artery. Peak systolic velocity in the mid internal carotid artery is mildly elevated measuring 145 cm/sec. Mild tortuosity in the mid internal carotid artery. RIGHT VERTEBRAL ARTERY: Antegrade flow and normal waveform in the right vertebral artery. LEFT CAROTID ARTERY: Calcified plaque in the distal common carotid artery and bulb region. Plaque in the proximal external carotid artery region. External carotid artery is patent with normal waveform. Focal plaque in the proximal internal carotid artery appear stable. Again noted are low velocities in the carotid arteries particularly in the left internal carotid artery. Normal waveforms in left carotid arteries. The low velocities in the internal carotid artery are similar to the previous examination. LEFT VERTEBRAL ARTERY: Antegrade flow and normal waveform in the left vertebral artery. IMPRESSION: Stable carotid artery duplex. Again noted is a minimally elevated peak systolic velocity in the right internal carotid artery. Findings could represent a 50-69% stenosis in the right internal carotid artery, closer to 50%. Again noted is focal plaque in the proximal left internal carotid artery. Estimated degree of stenosis in left internal carotid artery is less than 50%. Patent vertebral arteries with antegrade flow. Electronically Signed   By: Richarda Overlie M.D.   On: 12/27/2017 17:08   Ct Head Code Stroke Wo Contrast`  Result Date: 12/27/2017 CLINICAL DATA:  Code stroke. Focal neuro deficit less than 6 hours. Right facial droop, slurred speech EXAM: CT HEAD WITHOUT CONTRAST TECHNIQUE: Contiguous axial images were obtained from the base of the skull through the vertex without intravenous contrast. COMPARISON:  CT head 06/10/2017 FINDINGS: Brain: Negative for acute infarct.  Negative for hemorrhage or mass. Chronic right PCA infarct in the right occipital lobe unchanged. Chronic left parietal cortical infarct  unchanged. Chronic infarcts in the white matter bilaterally. Mild atrophy. Vascular: Negative for acute vascular thrombosis Skull: Negative Sinuses/Orbits: Negative Other: None ASPECTS ([place]berta Stroke Program Early CT Score) - Ganglionic level infarction (caudate, lentiform nuclei, internal capsule, insula, M1-M3 cortex): 7 - Supraganglionic infarction (M4-M6 cortex): 3 Total score (0-10 with 10 being normal): 10 IMPRESSION: 1. No acute abnormality 2. ASPECTS is 10 3. Extensive chronic ischemic changes stable from the prior study. 4. These results were called by telephone at the time of interpretation on 12/27/2017 at 2:17 pm to Dr. Marily Memos , who verbally acknowledged these results. Electronically Signed   By: Marlan Palau M.D.   On: 12/27/2017 14:17    Assessment/Plan:     #1-history of multiple CVAs she continues on Plavix and aspirin she also takes Neurontin for apparently seizures see again does not really want to take Keppra since she feels it has side effects-she will need neurologic follow-up especially in light of her RCA stenosis of 50-60%   she is on a statin.-LDL was 57  Also would benefit from cardiology follow-up.  2.  History of type 2 diabetes as  noted above this appears to be fairly stable this was discussed with Dr. Chales Abrahams via phone and will continue her on current doses of Januvia and Amaryl.  3.  History of hypertension this appears stable as well on Norvasc and Lopressor-blood pressure manually today was 120/74 previous blood pressures 118/62 117/74.  4.  History of acute renal insufficiency this appears largely resolved as noted above creatinine earlier this week of 0.99 BUN of 19.  5.  Hypothyroidism TSH was normal in August she is on Synthroid will defer follow-up to primary care provider since her stay here was fairly short.  6.  History of left ankle pain this appears to have improved again she did twist this when she fell at home x-ray was negative for a  fracture.  7.  History of visual changes again she would benefit from ophthalmology consult as an outpatient-this will need follow-up by primary care provider.  8.  History of hyperlipidemia she continues on statin--LDL was 50  #9 history of Z61 deficiency we will start her on supplementation oral thousand micrograms a day with follow-up by primary care provider.  10.  Constipation apparently this has improved on Colace.  11.  History of depression with anxiety continues on Lexapro she does have Restoril at night apparently this is helping.  Again she will be going home apparently now with her son which is reassuring--- she would benefit from PT and OT for further strengthening-also will need follow-up as noted above.  [id number]- of note greater than 30 minutes spent on this discharge summary- including formulating and coordinating plan of care for numerous diagnoses.

## 2018-01-15 ENCOUNTER — Encounter: Payer: Self-pay | Admitting: Internal Medicine

## 2018-01-18 LAB — CUP PACEART REMOTE DEVICE CHECK
Date Time Interrogation Session: 20190211234131
MDC IDC PG IMPLANT DT: 20180815

## 2018-01-19 ENCOUNTER — Encounter: Payer: Self-pay | Admitting: Cardiology

## 2018-01-24 ENCOUNTER — Ambulatory Visit (INDEPENDENT_AMBULATORY_CARE_PROVIDER_SITE_OTHER): Payer: Medicare HMO | Admitting: *Deleted

## 2018-01-24 DIAGNOSIS — I639 Cerebral infarction, unspecified: Secondary | ICD-10-CM

## 2018-01-24 NOTE — Progress Notes (Signed)
Carelink Summary Report / Loop Recorder 

## 2018-02-23 ENCOUNTER — Telehealth: Payer: Self-pay | Admitting: Cardiology

## 2018-02-23 NOTE — Telephone Encounter (Signed)
Spoke w/ pt and requested that she send a manual transmission b/c her home monitor has not updated in at least 14 days.   

## 2018-02-24 ENCOUNTER — Ambulatory Visit (INDEPENDENT_AMBULATORY_CARE_PROVIDER_SITE_OTHER): Payer: Medicare HMO | Admitting: *Deleted

## 2018-02-24 DIAGNOSIS — I639 Cerebral infarction, unspecified: Secondary | ICD-10-CM | POA: Diagnosis not present

## 2018-02-25 NOTE — Progress Notes (Signed)
Carelink Summary Report / Loop Recorder 

## 2018-03-01 LAB — CUP PACEART REMOTE DEVICE CHECK
Implantable Pulse Generator Implant Date: 20180815
MDC IDC SESS DTM: 20190316200637

## 2018-03-22 ENCOUNTER — Telehealth: Payer: Self-pay | Admitting: Cardiology

## 2018-03-22 NOTE — Telephone Encounter (Signed)
[facility] requesting that pt send manual transmission b/c home monitor has not updated in at least 14 days.    

## 2018-03-25 LAB — CUP PACEART REMOTE DEVICE CHECK
Date Time Interrogation Session: 20190418200718
MDC IDC PG IMPLANT DT: 20180815

## 2018-03-29 ENCOUNTER — Ambulatory Visit (INDEPENDENT_AMBULATORY_CARE_PROVIDER_SITE_OTHER): Payer: Medicare HMO | Admitting: *Deleted

## 2018-03-29 DIAGNOSIS — I639 Cerebral infarction, unspecified: Secondary | ICD-10-CM

## 2018-03-30 NOTE — Progress Notes (Signed)
Carelink Summary Report / Loop Recorder 

## 2018-04-19 ENCOUNTER — Telehealth: Payer: Self-pay

## 2018-04-19 NOTE — Telephone Encounter (Signed)
LMOVM requesting that pt send manual transmission b/c home monitor has not updated in at least 14 days.   Spoke w/ pt and requested that she send a manual transmission b/c her home monitor has not updated in at least 14 days.   

## 2018-04-19 NOTE — Telephone Encounter (Signed)
[facility] called because the patient is having an mri there this morning and wanted someone to come reprogram the loop recorder so the information would not be lost. I gave them the number to [employer].

## 2018-04-25 LAB — CUP PACEART REMOTE DEVICE CHECK
Date Time Interrogation Session: 20190521203801
MDC IDC PG IMPLANT DT: 20180815

## 2018-04-26 ENCOUNTER — Encounter: Payer: Self-pay | Admitting: Cardiology

## 2018-05-02 ENCOUNTER — Ambulatory Visit: Payer: Medicare HMO | Admitting: *Deleted

## 2018-05-02 NOTE — Progress Notes (Signed)
Carelink Summary Report / Loop Recorder 

## 2018-05-09 ENCOUNTER — Encounter: Payer: Self-pay | Admitting: Cardiology

## 2018-06-03 ENCOUNTER — Encounter: Payer: Medicare HMO | Admitting: *Deleted

## 2018-06-28 ENCOUNTER — Other Ambulatory Visit: Payer: Self-pay

## 2018-06-28 ENCOUNTER — Emergency Department (HOSPITAL_COMMUNITY): Payer: Medicare HMO

## 2018-06-28 ENCOUNTER — Observation Stay (HOSPITAL_COMMUNITY)
Admission: EM | Admit: 2018-06-28 | Discharge: 2018-06-29 | Disposition: A | Discharge status: Home / Self Care | Payer: Medicare HMO | Attending: Internal Medicine | Admitting: Internal Medicine

## 2018-06-28 ENCOUNTER — Encounter (HOSPITAL_COMMUNITY): Payer: Self-pay | Admitting: *Deleted

## 2018-06-28 DIAGNOSIS — Z882 Allergy status to sulfonamides status: Secondary | ICD-10-CM | POA: Insufficient documentation

## 2018-06-28 DIAGNOSIS — I779 Disorder of arteries and arterioles, unspecified: Secondary | ICD-10-CM

## 2018-06-28 DIAGNOSIS — K589 Irritable bowel syndrome without diarrhea: Secondary | ICD-10-CM | POA: Insufficient documentation

## 2018-06-28 DIAGNOSIS — Z833 Family history of diabetes mellitus: Secondary | ICD-10-CM | POA: Diagnosis not present

## 2018-06-28 DIAGNOSIS — I739 Peripheral vascular disease, unspecified: Secondary | ICD-10-CM

## 2018-06-28 DIAGNOSIS — R569 Unspecified convulsions: Secondary | ICD-10-CM

## 2018-06-28 DIAGNOSIS — Z7902 Long term (current) use of antithrombotics/antiplatelets: Secondary | ICD-10-CM | POA: Insufficient documentation

## 2018-06-28 DIAGNOSIS — R55 Syncope and collapse: Secondary | ICD-10-CM | POA: Diagnosis not present

## 2018-06-28 DIAGNOSIS — I6523 Occlusion and stenosis of bilateral carotid arteries: Secondary | ICD-10-CM | POA: Insufficient documentation

## 2018-06-28 DIAGNOSIS — Z823 Family history of stroke: Secondary | ICD-10-CM | POA: Diagnosis not present

## 2018-06-28 DIAGNOSIS — Z8249 Family history of ischemic heart disease and other diseases of the circulatory system: Secondary | ICD-10-CM | POA: Insufficient documentation

## 2018-06-28 DIAGNOSIS — M542 Cervicalgia: Secondary | ICD-10-CM | POA: Diagnosis not present

## 2018-06-28 DIAGNOSIS — I2510 Atherosclerotic heart disease of native coronary artery without angina pectoris: Secondary | ICD-10-CM | POA: Insufficient documentation

## 2018-06-28 DIAGNOSIS — E785 Hyperlipidemia, unspecified: Secondary | ICD-10-CM | POA: Diagnosis not present

## 2018-06-28 DIAGNOSIS — Z9861 Coronary angioplasty status: Secondary | ICD-10-CM

## 2018-06-28 DIAGNOSIS — Z9181 History of falling: Secondary | ICD-10-CM | POA: Insufficient documentation

## 2018-06-28 DIAGNOSIS — R531 Weakness: Secondary | ICD-10-CM | POA: Diagnosis not present

## 2018-06-28 DIAGNOSIS — F411 Generalized anxiety disorder: Secondary | ICD-10-CM | POA: Insufficient documentation

## 2018-06-28 DIAGNOSIS — Z8673 Personal history of transient ischemic attack (TIA), and cerebral infarction without residual deficits: Secondary | ICD-10-CM | POA: Insufficient documentation

## 2018-06-28 DIAGNOSIS — Z7984 Long term (current) use of oral hypoglycemic drugs: Secondary | ICD-10-CM | POA: Insufficient documentation

## 2018-06-28 DIAGNOSIS — Z7982 Long term (current) use of aspirin: Secondary | ICD-10-CM | POA: Diagnosis not present

## 2018-06-28 DIAGNOSIS — Z955 Presence of coronary angioplasty implant and graft: Secondary | ICD-10-CM | POA: Insufficient documentation

## 2018-06-28 DIAGNOSIS — E039 Hypothyroidism, unspecified: Secondary | ICD-10-CM | POA: Insufficient documentation

## 2018-06-28 DIAGNOSIS — I10 Essential (primary) hypertension: Secondary | ICD-10-CM | POA: Diagnosis not present

## 2018-06-28 DIAGNOSIS — I639 Cerebral infarction, unspecified: Secondary | ICD-10-CM | POA: Diagnosis present

## 2018-06-28 DIAGNOSIS — E119 Type 2 diabetes mellitus without complications: Secondary | ICD-10-CM | POA: Diagnosis not present

## 2018-06-28 LAB — CBC WITH DIFFERENTIAL/PLATELET
BASOS ABS: 0 10*3/uL (ref 0.0–0.1)
Basophils Relative: 0 %
EOS PCT: 2 %
Eosinophils Absolute: 0.2 10*3/uL (ref 0.0–0.7)
HEMATOCRIT: 44.5 % (ref 36.0–46.0)
Hemoglobin: 15.2 g/dL — ABNORMAL HIGH (ref 12.0–15.0)
LYMPHS ABS: 1.8 10*3/uL (ref 0.7–4.0)
LYMPHS PCT: 21 %
MCH: 28.3 pg (ref 26.0–34.0)
MCHC: 34.2 g/dL (ref 30.0–36.0)
MCV: 82.9 fL (ref 78.0–100.0)
MONO ABS: 0.8 10*3/uL (ref 0.1–1.0)
MONOS PCT: 9 %
NEUTROS ABS: 5.8 10*3/uL (ref 1.7–7.7)
Neutrophils Relative %: 68 %
PLATELETS: 242 10*3/uL (ref 150–400)
RBC: 5.37 MIL/uL — ABNORMAL HIGH (ref 3.87–5.11)
RDW: 13.8 % (ref 11.5–15.5)
WBC: 8.6 10*3/uL (ref 4.0–10.5)

## 2018-06-28 LAB — COMPREHENSIVE METABOLIC PANEL
ALBUMIN: 4.1 g/dL (ref 3.5–5.0)
ALT: 17 U/L (ref 0–44)
ANION GAP: 11 (ref 5–15)
AST: 17 U/L (ref 15–41)
Alkaline Phosphatase: 95 U/L (ref 38–126)
BILIRUBIN TOTAL: 1 mg/dL (ref 0.3–1.2)
BUN: 12 mg/dL (ref 8–23)
CHLORIDE: 99 mmol/L (ref 98–111)
CO2: 28 mmol/L (ref 22–32)
Calcium: 9.5 mg/dL (ref 8.9–10.3)
Creatinine, Ser: 0.98 mg/dL (ref 0.44–1.00)
GFR calc Af Amer: >60 mL/min (ref >60)
GFR calc non Af Amer: 58 mL/min — ABNORMAL LOW (ref >60)
GLUCOSE: 139 mg/dL — AB (ref 70–99)
POTASSIUM: 3.9 mmol/L (ref 3.5–5.1)
SODIUM: 138 mmol/L (ref 135–145)
Total Protein: 7.3 g/dL (ref 6.5–8.1)

## 2018-06-28 LAB — URINALYSIS, ROUTINE W REFLEX MICROSCOPIC
Bilirubin Urine: NEGATIVE
GLUCOSE, UA: NEGATIVE mg/dL
HGB URINE DIPSTICK: NEGATIVE
KETONES UR: NEGATIVE mg/dL
LEUKOCYTES UA: NEGATIVE
Nitrite: NEGATIVE
PH: 5 (ref 5.0–8.0)
Protein, ur: NEGATIVE mg/dL
Specific Gravity, Urine: 1.003 — ABNORMAL LOW (ref 1.005–1.030)

## 2018-06-28 LAB — TROPONIN I: Troponin I: <0.03 ng/mL (ref <0.03)

## 2018-06-28 MED ORDER — ACETAMINOPHEN 325 MG PO TABS
650.0000 mg | ORAL_TABLET | Freq: Once | ORAL | Status: AC
  Administered 2018-06-28: 650 mg via ORAL
  Filled 2018-06-28: qty 2

## 2018-06-28 MED ORDER — SODIUM CHLORIDE 0.9 % IV BOLUS
1000.0000 mL | Freq: Once | INTRAVENOUS | Status: AC
  Administered 2018-06-28: 1000 mL via INTRAVENOUS

## 2018-06-28 NOTE — ED Provider Notes (Signed)
[facility] EMERGENCY DEPARTMENT Provider Note   CSN: [id number] Arrival date & time: 06/28/18  2000     History   Chief Complaint Chief Complaint  Patient presents with  . Weakness    HPI Shannon [name] is a 67 y.o. female who presents with weakness. PMH of recent CVA in February, diabetes, hypertension, coronary artery disease. She states that throughout the day she felt very fatigued. She hasn't been eating or drinking much. She's felt lightheaded like she was going to pass out and has also been nauseous at times. She states that she "felt like a stroke was coming on". When asked to elaborate - she cannot. She states that she knows what she wants to say but cannot say it. This is not a new problem however. She also reports her vision was more blurry than usual but not now. She checked her BG this after noon and her glucometer read "high". She took medicine for her diabetes. This evening she was standing and had an acute onset of jerking of her bilateral arms and legs which was uncontrollable. She did not pass out with this episode. This caused her to fall backwards and hit her head. She has posterior head pain and left sided neck pain from this. She called EMS and they transported. Of note, recheck CBG was 151. She denies chest pain, SOB, abdominal pain, vomiting, diarrhea, dysuria. She has chronic bladder incontinence. She lives at home and typically walks with a walker or cane.  HPI  Past Medical History:  Diagnosis Date  . Anxiety   . Arthritis   . Coronary artery disease    a. s/p PCI with DES to LAD in 2011  (LHC 03/2010: Mid LAD 70%, normal ejection fraction  >> PCI:  2.75 x 15 mm Promus DES to LAD); b. Nuclear Stress Test 05/29/14: No ischemia, EF 59%, Normal; c. NSTEMI 11/2014 >> patient declined LHC b/c of recent CVA  . Depression   . Diabetes mellitus without complication (HCC)   . Dyslipidemia 11/20/2014  . Fibromyalgia 1995  . H/O ischemic right PCA stroke 10/2014   a.  Carotid US 10/24/14: No significant ICA stenosis bilaterally  . Hemiparesis and alteration of sensations as late effects of stroke (HCC) 06/05/2015  . Hx of echocardiogram    a. Echocardiogram 10/25/14: Moderate LVH, EF 50-55%, normal wall motion, grade 2 diastolic dysfunction, mild LAE, atrial septal lipomatous hypertrophy  . Hypertension   . Irritable bowel     Patient Active Problem List   Diagnosis Date Noted  . Left ankle pain and swelling 12/28/2017  . CVA (cerebral vascular accident) (HCC) 12/27/2017  . Malnutrition of moderate degree 06/09/2017  . CVA (cerebrovascular accident) (HCC) 04/10/2017  . Dizziness 04/09/2017  . Hemiparesis and alteration of sensations as late effects of stroke (HCC) 06/05/2015  . Numbness 12/27/2014  . Visual disturbance 11/26/2014  . Dyslipidemia 11/20/2014  . Hypothyroidism 11/20/2014  . Pain   . Cerebral thrombosis with cerebral infarction (HCC) 11/19/2014  . Weakness   . NSTEMI (non-ST elevated myocardial infarction) (HCC) 11/17/2014  . Acute CVA -Dec 2015 10/24/2014  . DM type 2 (diabetes mellitus, type 2) (HCC) 10/24/2014  . Accelerated hypertension 10/24/2014  . H/O ischemic right PCA stroke 10/09/2014  . Atypical chest pain 05/16/2014  . CAD S/P LAD PCI July 2011 07/01/2013  . Hypertension, essential 07/01/2013  . Fibromyalgia 07/01/2013  . IBS (irritable bowel syndrome) 07/01/2013    Past Surgical History:  Procedure Laterality Date  .  CHOLECYSTECTOMY    . CORONARY STENT PLACEMENT    . LOOP RECORDER INSERTION N/A 06/23/2017   Procedure: Loop Recorder Insertion;  Surgeon: Duke SalviaKlein, Steven C, MD;  [facility];  Service: Cardiovascular;  Laterality: N/A;  . orif arm fracture Right    forearm  . TEE WITHOUT CARDIOVERSION N/A 04/12/2017   Procedure: TRANSESOPHAGEAL ECHOCARDIOGRAM (TEE) WITH PROPOFOL;  Surgeon: Jonelle SidleMcDowell, Samuel G, MD;  Location: [facility] ENDO SUITE;  Service: Cardiovascular;  Laterality: N/A;     OB History     Gravida  12   Para  4   Term  4   Preterm      AB  8   Living        SAB      TAB      Ectopic      Multiple      Live Births               Home Medications    Prior to Admission medications   Medication Sig Start Date End Date Taking? Authorizing Provider  acetaminophen (TYLENOL) 500 MG tablet Take 500 mg by mouth daily as needed for mild pain.    [provider]  amLODipine (NORVASC) 10 MG tablet Take 10 mg by mouth daily. 09/11/16   [provider]  aspirin EC 81 MG tablet Take 1 tablet (81 mg total) by mouth daily. 11/27/14   Tereso NewcomerWeaver, Scott T, PA-C  atorvastatin (LIPITOR) 40 MG tablet Take 1 tablet (40 mg total) by mouth daily at 6 PM. 12/30/17   Erick BlinksMemon, Jehanzeb, MD  b complex vitamins tablet Take 1 tablet by mouth daily.    [provider]  Cholecalciferol (VITAMIN D-3) 1000 UNITS CAPS Take 1,000 Units by mouth daily.     [provider]  Chromium 200 MCG CAPS Take 200 mcg by mouth every 30 (thirty) days.     [provider]  clopidogrel (PLAVIX) 75 MG tablet TAKE ONE TABLET ONCE DAILY WITH BREAKFAST 08/13/17   Jake BatheSkains, Mark C, MD  co-enzyme Q-10 30 MG capsule Take 30 mg by mouth daily.    [provider]  docusate sodium (COLACE) 100 MG capsule Take 100 mg by mouth at bedtime.    [provider]  escitalopram (LEXAPRO) 10 MG tablet Take 1 tablet by mouth daily. 12/01/17   [provider]  gabapentin (NEURONTIN) 400 MG capsule Take 400 mg by mouth 2 (two) times daily. 07/29/16   [provider]  glimepiride (AMARYL) 2 MG tablet Take 2 mg by mouth daily with breakfast.    [provider]  isosorbide mononitrate (IMDUR) 30 MG 24 hr tablet Take 1 tablet (30 mg total) by mouth daily. DUE APPT IN AUGUST, MAKE APPT FOR FURTHER REFILLS, CALL [phone] TO SCHEDULE 06/12/17   Houston SirenLe, Peter, MD  JANUVIA 100 MG tablet Take 100 mg by mouth daily. 09/11/16   [provider]  levothyroxine  (SYNTHROID, LEVOTHROID) 25 MCG tablet TAKE 1 TABLET (25 MCG TOTAL) BY MOUTH DAILY BEFORE BREAKFAST. 07/30/15   Jake BatheSkains, Mark C, MD  metoprolol succinate (TOPROL-XL) 25 MG 24 hr tablet Take 2 tablets by mouth once a morning    [provider]  metoprolol tartrate (LOPRESSOR) 25 MG tablet Take 1 tablet by mouth once a evening    [provider]  Multiple Vitamin (MULTIVITAMIN) capsule Take 1 capsule by mouth daily. Senior vitamin    [provider]  nitroGLYCERIN (NITROSTAT) 0.4 MG SL tablet Place  1 tablet (0.4 mg total) under the tongue every 5 (five) minutes as needed. 06/12/17   Houston SirenLe, Peter, MD  nystatin cream (MYCOSTATIN) Apply 1 application topically 2 (two) times daily.    [provider]  temazepam (RESTORIL) 30 MG capsule Take 1 capsule (30 mg total) by mouth at bedtime. 01/05/18   Edmon CrapeLassen, Arlo C, PA-C  isosorbide dinitrate (ISORDIL) 30 MG tablet Take 1 tablet (30 mg total) by mouth every morning. 03/14/14 07/13/14  Ambrose FinlandKeck, Valerie A, NP    Family History Family History  Problem Relation Age of Onset  . Cancer Mother   . Diabetes Mother   . Hypertension Mother   . Heart failure Mother   . CAD Mother   . Heart attack Mother   . Cancer Father   . Diabetes Father   . Hypertension Father   . Heart failure Father   . CAD Father   . Heart attack Father   . Stroke Father   . CAD Brother   . Diabetes Brother   . Hypertension Brother   . CAD Sister   . Diabetes Sister   . Hypertension Sister   . Heart attack Sister   . Heart attack Brother   . Stroke Maternal Aunt     Social History Social History   Tobacco Use  . Smoking status: Never Smoker  . Smokeless tobacco: Never Used  Substance Use Topics  . Alcohol use: No    Alcohol/week: 0.0 standard drinks  . Drug use: No     Allergies   Insulins; Sulfa antibiotics; and Lisinopril   Review of Systems Review of Systems  Constitutional: Positive for appetite change and fatigue. Negative for chills  and fever.  Eyes: Positive for visual disturbance. Negative for pain.  Respiratory: Negative for shortness of breath.   Cardiovascular: Negative for chest pain.  Gastrointestinal: Negative for abdominal pain.  Genitourinary: Positive for frequency (chronic). Negative for dysuria.  Musculoskeletal: Positive for neck pain. Negative for back pain.  Skin: Negative for wound.  Neurological: Positive for tremors, speech difficulty, weakness, light-headedness and headaches. Negative for seizures and syncope.  All other systems reviewed and are negative.    Physical Exam Updated Vital Signs BP (!) 148/70   Pulse 69   Temp 97.8 F (36.6 C) (Oral)   Resp 18   Ht 5\' 3"  (1.6 m)   Wt 63.5 kg   SpO2 99%   BMI 24.80 kg/m   Physical Exam  Constitutional: She is oriented to person, place, and time. She appears well-developed and well-nourished. No distress.  Chronically ill appearing female in NAD. Some difficulty with word finding but overall no significant dysarthria  HENT:  Head: Normocephalic and atraumatic.  Eyes: Pupils are equal, round, and reactive to light. Conjunctivae are normal. Right eye exhibits no discharge. Left eye exhibits no discharge. No scleral icterus.  Neck: Normal range of motion.  Cardiovascular: Normal rate and regular rhythm.  Pulmonary/Chest: Effort normal and breath sounds normal. No respiratory distress.  Abdominal: Soft. Bowel sounds are normal. She exhibits no distension. There is no tenderness.  Neurological: She is alert and oriented to person, place, and time.  Mental Status:  Alert, oriented, thought content appropriate. Poor historian. Some difficulty with word finding. Speech fluent without evidence of aphasia. Able to follow 2 step commands without difficulty.  Cranial Nerves:  II:  PERRLA. Visual fields intact on right only.  III,IV, VI: ptosis not present, extra-ocular motions intact bilaterally  V,VII: smile symmetric, facial light touch  sensation  equal VIII: hearing grossly normal to voice  X: uvula elevates symmetrically  XI: bilateral shoulder shrug symmetric and strong XII: midline tongue extension without fassiculations Motor:  Normal tone. 5/5 in upper and lower extremities bilaterally including strong and equal grip strength and dorsiflexion/plantar flexion. Mild tremors with arm movement. Sensory: Pinprick and light touch normal in all extremities.  Cerebellar: abnormal finger to nose Gait: not tested CV: distal pulses palpable throughout    Skin: Skin is warm and dry.  Psychiatric: She has a normal mood and affect. Her behavior is normal.  Nursing note and vitals reviewed.    ED Treatments / Results  Labs (all labs ordered are listed, but only abnormal results are displayed) Labs Reviewed  COMPREHENSIVE METABOLIC PANEL - Abnormal; Notable for the following components:      Result Value   Glucose, Bld 139 (*)    GFR calc non Af Amer 58 (*)    All other components within normal limits  CBC WITH DIFFERENTIAL/PLATELET - Abnormal; Notable for the following components:   RBC 5.37 (*)    Hemoglobin 15.2 (*)    All other components within normal limits  URINALYSIS, ROUTINE W REFLEX MICROSCOPIC - Abnormal; Notable for the following components:   Color, Urine STRAW (*)    Specific Gravity, Urine 1.003 (*)    All other components within normal limits  TROPONIN I    EKG EKG Interpretation  Date/Time:  Tuesday June 28 2018 20:56:24 EDT Ventricular Rate:  74 PR Interval:    QRS Duration: 245 QT Interval:  464 QTC Calculation: 515 R Axis:   54 Text Interpretation:  Sinus rhythm Probable left ventricular hypertrophy Prolonged QT interval Confirmed by Donnetta Hutching ([id number]) on 06/28/2018 9:13:32 PM   Radiology Dg Chest 2 View  Result Date: 06/28/2018 CLINICAL DATA:  Weakness, shaking today.  History of hypertension. EXAM: CHEST - 2 VIEW COMPARISON:  Chest radiograph April 18, 2018 FINDINGS: Cardiac silhouette is  mildly enlarged and unchanged. Calcified aortic arch, mediastinal silhouette is normal. No pleural effusion or focal consolidation. Pulmonary vasculature is normal. No pneumothorax. Loop monitor projects LEFT chest wall. Surgical clips in the included right abdomen compatible with cholecystectomy. Osteopenia. Probable loose body RIGHT shoulder. IMPRESSION: 1. Mild cardiomegaly.  No acute pulmonary process. 2.  Aortic Atherosclerosis (ICD10-I70.0). Electronically Signed   By: Awilda Metro M.D.   On: 06/28/2018 21:59   Ct Head Wo Contrast  Result Date: 06/28/2018 CLINICAL DATA:  Weakness to the bilateral arms and legs EXAM: CT HEAD WITHOUT CONTRAST CT CERVICAL SPINE WITHOUT CONTRAST TECHNIQUE: Multidetector CT imaging of the head and cervical spine was performed following the standard protocol without intravenous contrast. Multiplanar CT image reconstructions of the cervical spine were also generated. COMPARISON:  MRI 04/19/2018, CT 04/18/2017, 04/16/2016 FINDINGS: CT HEAD FINDINGS Brain: No acute territorial infarction, hemorrhage or intracranial mass. Encephalomalacia in the right occipital and posterior temporal lobes. Small focus of encephalomalacia in the left parietal lobe near the vertex. Atrophy. Mild small vessel ischemic changes of the white matter. Stable ventricle size. Vascular: No hyperdense vessels.  Carotid vascular calcification. Skull: Normal. Negative for fracture or focal lesion. Sinuses/Orbits: No acute finding. Other: None CT CERVICAL SPINE FINDINGS Alignment: No subluxation.  Facet alignment within normal limits. Skull base and vertebrae: Craniovertebral junction is intact. No fracture. Incomplete fusion posterior arch C2. Soft tissues and spinal canal: No prevertebral soft tissue thickening. Disc levels: Fusion at C3-C4. Moderate to marked degenerative changes C2-C3, C4-C5, C5-C6 Upper chest: Lung  apices are clear. Heterogenous hypodensities within the thyroid. Other: None IMPRESSION:  1. No CT evidence for acute intracranial abnormality. Atrophy and small vessel ischemic changes of the white matter. Chronic infarcts bilateral right greater than left infarcts. 2. Degenerative changes of the cervical spine with fusion at C3-C4. No acute osseous abnormality Electronically Signed   By: Jasmine Pang M.D.   On: 06/28/2018 22:10   Ct Cervical Spine Wo Contrast  Result Date: 06/28/2018 CLINICAL DATA:  Weakness to the bilateral arms and legs EXAM: CT HEAD WITHOUT CONTRAST CT CERVICAL SPINE WITHOUT CONTRAST TECHNIQUE: Multidetector CT imaging of the head and cervical spine was performed following the standard protocol without intravenous contrast. Multiplanar CT image reconstructions of the cervical spine were also generated. COMPARISON:  MRI 04/19/2018, CT 04/18/2017, 04/16/2016 FINDINGS: CT HEAD FINDINGS Brain: No acute territorial infarction, hemorrhage or intracranial mass. Encephalomalacia in the right occipital and posterior temporal lobes. Small focus of encephalomalacia in the left parietal lobe near the vertex. Atrophy. Mild small vessel ischemic changes of the white matter. Stable ventricle size. Vascular: No hyperdense vessels.  Carotid vascular calcification. Skull: Normal. Negative for fracture or focal lesion. Sinuses/Orbits: No acute finding. Other: None CT CERVICAL SPINE FINDINGS Alignment: No subluxation.  Facet alignment within normal limits. Skull base and vertebrae: Craniovertebral junction is intact. No fracture. Incomplete fusion posterior arch C2. Soft tissues and spinal canal: No prevertebral soft tissue thickening. Disc levels: Fusion at C3-C4. Moderate to marked degenerative changes C2-C3, C4-C5, C5-C6 Upper chest: Lung apices are clear. Heterogenous hypodensities within the thyroid. Other: None IMPRESSION: 1. No CT evidence for acute intracranial abnormality. Atrophy and small vessel ischemic changes of the white matter. Chronic infarcts bilateral right greater than left  infarcts. 2. Degenerative changes of the cervical spine with fusion at C3-C4. No acute osseous abnormality Electronically Signed   By: Jasmine Pang M.D.   On: 06/28/2018 22:10    Procedures Procedures (including critical care time)  Medications Ordered in ED Medications  sodium chloride 0.9 % bolus 1,000 mL (0 mLs Intravenous Stopped 06/28/18 2254)  acetaminophen (TYLENOL) tablet 650 mg (650 mg Oral Given 06/28/18 2253)     Initial Impression / Assessment and Plan / ED Course  I have reviewed the triage vital signs and the nursing notes.  Pertinent labs & imaging results that were available during my care of the patient were reviewed by me and considered in my medical decision making (see chart for details).  67 year old female presents with "involuntary jerking" of her arms and legs tonight causing a fall and head/neck injury. She is mildly hypertensive on arrival but otherwise vitals are normal. Her exam is difficult because it is unclear what her baseline is due to her multiple strokes. She does have left sided vision loss and dysmetria on exam. The vision loss is a chronic problem as is her dysarthria. She has a son who lives close to her but states that her phone is dead and she doesn't know his number. Of note she did have an abnormal EEG during her last admission and has not been on anti-epileptics other than gabapentin. On review of her last consult note with neurology a lot her symptoms tonight (lightheadedness, fall, fatigue) were symptoms she had prior to when she was diagnosed with an acute stroke. Her work up in the ED is overall unremarkable. EKG is normal. Trop is normal. CXR is negative. CT head/C-spine is negative. Labs and Urine are normal. Shared visit with Dr. Adriana Simas. Discussed with  Dr. Sherryll Burger with hospitalist service who will come to see the patient.   Final Clinical Impressions(s) / ED Diagnoses   Final diagnoses:  Seizure-like activity [facility])  Generalized weakness    ED  Discharge Orders    None       Beryle Quant 06/28/18 2330    Donnetta Hutching, MD 06/30/18 [id number]

## 2018-06-28 NOTE — H&P (Addendum)
History and Physical    Shannon [name] JYN:[id number] DOB: 24-May-1951 DOA: 06/28/2018  PCP: Nila Nephew, MD   Patient coming from: Home  Chief Complaint: Weakness/jerking  HPI: Shannon [name] is a 67 y.o. female with medical history significant for prior CVA in February, type 2 diabetes, CAD, carotid artery disease, hypertension, IBS, dyslipidemia, and hypothyroidism who called EMS after she had an acute onset of jerking of her bilateral arms and legs while she was standing up this evening.  She appears to recall the entire episode and had no loss of consciousness.  The jerking movements caused her to fall backwards and hit her head causing some pain to the back of her head as well as left-sided neck pain.  Prior to this she states that she had been feeling quite fatigued and lightheaded throughout the day, but she had not been eating or drinking much as she was feeling somewhat nauseous.  She states that this is similar to how she felt prior to her CVA in February.  She denies any headache, weakness, new visual deficits, speech deficits, or swallow deficits.  There is some question of prior seizure history and prescription for Keppra, but this does not appear on her med list nor she taking the medication.   ED Course: Vital signs are stable in the emergency department laboratory data remarkable only for an elevated hemoglobin of 15.2 and glucose of 139.  Urine analysis within normal limits.  Two-view chest x-ray with mild cardiomegaly no acute findings.  CT head with no acute findings.  She had been given some Tylenol and a 1 L fluid bolus.  Review of Systems: All others reviewed and otherwise negative.  Past Medical History:  Diagnosis Date  . Anxiety   . Arthritis   . Coronary artery disease    a. s/p PCI with DES to LAD in 2011  (LHC 03/2010: Mid LAD 70%, normal ejection fraction  >> PCI:  2.75 x 15 mm Promus DES to LAD); b. Nuclear Stress Test 05/29/14: No ischemia, EF 59%, Normal; c. NSTEMI  11/2014 >> patient declined LHC b/c of recent CVA  . Depression   . Diabetes mellitus without complication (HCC)   . Dyslipidemia 11/20/2014  . Fibromyalgia 1995  . H/O ischemic right PCA stroke 10/2014   a. Carotid US 10/24/14: No significant ICA stenosis bilaterally  . Hemiparesis and alteration of sensations as late effects of stroke (HCC) 06/05/2015  . Hx of echocardiogram    a. Echocardiogram 10/25/14: Moderate LVH, EF 50-55%, normal wall motion, grade 2 diastolic dysfunction, mild LAE, atrial septal lipomatous hypertrophy  . Hypertension   . Irritable bowel     Past Surgical History:  Procedure Laterality Date  . CHOLECYSTECTOMY    . CORONARY STENT PLACEMENT    . LOOP RECORDER INSERTION N/A 06/23/2017   Procedure: Loop Recorder Insertion;  Surgeon: Duke Salvia, MD;  Location: [facility] INVASIVE CV LAB;  Service: Cardiovascular;  Laterality: N/A;  . orif arm fracture Right    forearm  . TEE WITHOUT CARDIOVERSION N/A 04/12/2017   Procedure: TRANSESOPHAGEAL ECHOCARDIOGRAM (TEE) WITH PROPOFOL;  Surgeon: Jonelle Sidle, MD;  Location: [facility];  Service: Cardiovascular;  Laterality: N/A;     reports that she has never smoked. She has never used smokeless tobacco. She reports that she does not drink alcohol or use drugs.  Allergies  Allergen Reactions  . Insulins Other (See Comments)    Causes severe diarrhea  . Sulfa Antibiotics Other (See Comments)  Childhood allergy, can't remember  . Lisinopril Cough    Family History  Problem Relation Age of Onset  . Cancer Mother   . Diabetes Mother   . Hypertension Mother   . Heart failure Mother   . CAD Mother   . Heart attack Mother   . Cancer Father   . Diabetes Father   . Hypertension Father   . Heart failure Father   . CAD Father   . Heart attack Father   . Stroke Father   . CAD Brother   . Diabetes Brother   . Hypertension Brother   . CAD Sister   . Diabetes Sister   . Hypertension Sister   . Heart attack  Sister   . Heart attack Brother   . Stroke Maternal Aunt     Prior to Admission medications   Medication Sig Start Date End Date Taking? Authorizing Provider  aspirin EC 81 MG tablet Take 1 tablet (81 mg total) by mouth daily. 11/27/14  Yes Weaver, Scott T, PA-C  clopidogrel (PLAVIX) 75 MG tablet TAKE ONE TABLET ONCE DAILY WITH BREAKFAST Patient taking differently: Take 75 mg by mouth daily.  08/13/17  Yes Jake BatheSkains, Mark C, MD  gabapentin (NEURONTIN) 400 MG capsule Take 400 mg by mouth 2 (two) times daily. 07/29/16  Yes [provider]  hydrOXYzine (ATARAX/VISTARIL) 25 MG tablet Take 25 mg by mouth every 6 (six) hours as needed for anxiety.  06/10/18  Yes [provider]  acetaminophen (TYLENOL) 500 MG tablet Take 500 mg by mouth daily as needed for mild pain.    [provider]  amLODipine (NORVASC) 10 MG tablet Take 10 mg by mouth daily. 09/11/16   [provider]  atorvastatin (LIPITOR) 40 MG tablet Take 1 tablet (40 mg total) by mouth daily at 6 PM. 12/30/17   Erick BlinksMemon, Jehanzeb, MD  b complex vitamins tablet Take 1 tablet by mouth daily.    [provider]  Cholecalciferol (VITAMIN D-3) 1000 UNITS CAPS Take 1,000 Units by mouth daily.     [provider]  Chromium 200 MCG CAPS Take 200 mcg by mouth every 30 (thirty) days.     [provider]  co-enzyme Q-10 30 MG capsule Take 30 mg by mouth daily.    [provider]  docusate sodium (COLACE) 100 MG capsule Take 100 mg by mouth at bedtime.    [provider]  escitalopram (LEXAPRO) 10 MG tablet Take 1 tablet by mouth daily. 12/01/17   [provider]  glimepiride (AMARYL) 2 MG tablet Take 2 mg by mouth daily with breakfast.    [provider]  isosorbide mononitrate (IMDUR) 30 MG 24 hr tablet Take 1 tablet (30 mg total) by mouth daily. DUE APPT IN AUGUST, MAKE APPT FOR FURTHER REFILLS, CALL [phone] TO SCHEDULE 06/12/17   Houston SirenLe, Peter, MD  JANUVIA 100 MG  tablet Take 100 mg by mouth daily. 09/11/16   [provider]  ketorolac (ACULAR) 0.4 % SOLN  03/22/18   [provider]  levothyroxine (SYNTHROID, LEVOTHROID) 25 MCG tablet TAKE 1 TABLET (25 MCG TOTAL) BY MOUTH DAILY BEFORE BREAKFAST. 07/30/15   Jake BatheSkains, Mark C, MD  losartan (COZAAR) 100 MG tablet  05/17/18   [provider]  metoprolol succinate (TOPROL-XL) 25 MG 24 hr tablet Take 2 tablets by mouth once a morning    [provider]  metoprolol tartrate (LOPRESSOR) 25 MG tablet Take 25-50 mg by mouth See admin instructions. 25mg  in the  morning and 50mg  in the evening    [provider]  Multiple Vitamin (MULTIVITAMIN) capsule Take 1 capsule by mouth daily. Senior vitamin    [provider]  nitroGLYCERIN (NITROSTAT) 0.4 MG SL tablet Place 1 tablet (0.4 mg total) under the tongue every 5 (five) minutes as needed. 06/12/17   Houston Siren, MD  nystatin cream (MYCOSTATIN) Apply 1 application topically 2 (two) times daily.    [provider]  temazepam (RESTORIL) 30 MG capsule Take 1 capsule (30 mg total) by mouth at bedtime. 01/05/18   Edmon Crape C, PA-C  isosorbide dinitrate (ISORDIL) 30 MG tablet Take 1 tablet (30 mg total) by mouth every morning. 03/14/14 07/13/14  Ambrose Finland, NP    Physical Exam: Vitals:   06/28/18 2006 06/28/18 2100 06/28/18 2230 06/28/18 2330  BP: (!) 148/70 116/84 129/72 (!) 154/78  Pulse: 69 (!) 57 66 64  Resp: 18 14 13 20   Temp: 97.8 F (36.6 C)     TempSrc: Oral     SpO2: 99% 93% 96% 97%  Weight:      Height:        Constitutional: NAD, calm, comfortable Vitals:   06/28/18 2006 06/28/18 2100 06/28/18 2230 06/28/18 2330  BP: (!) 148/70 116/84 129/72 (!) 154/78  Pulse: 69 (!) 57 66 64  Resp: 18 14 13 20   Temp: 97.8 F (36.6 C)     TempSrc: Oral     SpO2: 99% 93% 96% 97%  Weight:      Height:       Eyes: lids and conjunctivae normal ENMT: Mucous membranes are moist.  Neck: normal,  supple Respiratory: clear to auscultation bilaterally. Normal respiratory effort. No accessory muscle use.  Cardiovascular: Regular rate and rhythm, no murmurs. No extremity edema. Abdomen: no tenderness, no distention. Bowel sounds positive.  Musculoskeletal:  No joint deformity upper and lower extremities.   Skin: no rashes, lesions, ulcers.  Psychiatric: Cannot be assessed.  Labs on Admission: I have personally reviewed following labs and imaging studies  CBC: Recent Labs  Lab 06/28/18 2056  WBC 8.6  NEUTROABS 5.8  HGB 15.2*  HCT 44.5  MCV 82.9  PLT 242   Basic Metabolic Panel: Recent Labs  Lab 06/28/18 2056  NA 138  K 3.9  CL 99  CO2 28  GLUCOSE 139*  BUN 12  CREATININE 0.98  CALCIUM 9.5   GFR: Estimated Creatinine Clearance: 48.6 mL/min (by C-G formula based on SCr of 0.98 mg/dL). Liver Function Tests: Recent Labs  Lab 06/28/18 2056  AST 17  ALT 17  ALKPHOS 95  BILITOT 1.0  PROT 7.3  ALBUMIN 4.1   No results for input(s): LIPASE, AMYLASE in the last 168 hours. No results for input(s): AMMONIA in the last 168 hours. Coagulation Profile: No results for input(s): INR, PROTIME in the last 168 hours. Cardiac Enzymes: Recent Labs  Lab 06/28/18 2114  TROPONINI <0.03   BNP (last 3 results) No results for input(s): PROBNP in the last 8760 hours. HbA1C: No results for input(s): HGBA1C in the last 72 hours. CBG: No results for input(s): GLUCAP in the last 168 hours. Lipid Profile: No results for input(s): CHOL, HDL, LDLCALC, TRIG, CHOLHDL, LDLDIRECT in the last 72 hours. Thyroid Function Tests: No results for input(s): TSH, T4TOTAL, FREET4, T3FREE, THYROIDAB in the last 72 hours. Anemia Panel: No results for input(s): VITAMINB12, FOLATE, FERRITIN, TIBC, IRON, RETICCTPCT in the last 72 hours. Urine analysis:    Component Value Date/Time  COLORURINE STRAW (A) 06/28/2018 2045   APPEARANCEUR CLEAR 06/28/2018 2045   LABSPEC 1.003 (L) 06/28/2018 2045    PHURINE 5.0 06/28/2018 2045   GLUCOSEU NEGATIVE 06/28/2018 2045   HGBUR NEGATIVE 06/28/2018 2045   BILIRUBINUR NEGATIVE 06/28/2018 2045   BILIRUBINUR neg 04/13/2014 1733   KETONESUR NEGATIVE 06/28/2018 2045   PROTEINUR NEGATIVE 06/28/2018 2045   UROBILINOGEN 0.2 04/13/2014 1733   NITRITE NEGATIVE 06/28/2018 2045   LEUKOCYTESUR NEGATIVE 06/28/2018 2045    Radiological Exams on Admission: Dg Chest 2 View  Result Date: 06/28/2018 CLINICAL DATA:  Weakness, shaking today.  History of hypertension. EXAM: CHEST - 2 VIEW COMPARISON:  Chest radiograph April 18, 2018 FINDINGS: Cardiac silhouette is mildly enlarged and unchanged. Calcified aortic arch, mediastinal silhouette is normal. No pleural effusion or focal consolidation. Pulmonary vasculature is normal. No pneumothorax. Loop monitor projects LEFT chest wall. Surgical clips in the included right abdomen compatible with cholecystectomy. Osteopenia. Probable loose body RIGHT shoulder. IMPRESSION: 1. Mild cardiomegaly.  No acute pulmonary process. 2.  Aortic Atherosclerosis (ICD10-I70.0). Electronically Signed   By: Awilda Metroourtnay  Bloomer M.D.   On: 06/28/2018 21:59   Ct Head Wo Contrast  Result Date: 06/28/2018 CLINICAL DATA:  Weakness to the bilateral arms and legs EXAM: CT HEAD WITHOUT CONTRAST CT CERVICAL SPINE WITHOUT CONTRAST TECHNIQUE: Multidetector CT imaging of the head and cervical spine was performed following the standard protocol without intravenous contrast. Multiplanar CT image reconstructions of the cervical spine were also generated. COMPARISON:  MRI 04/19/2018, CT 04/18/2017, 04/16/2016 FINDINGS: CT HEAD FINDINGS Brain: No acute territorial infarction, hemorrhage or intracranial mass. Encephalomalacia in the right occipital and posterior temporal lobes. Small focus of encephalomalacia in the left parietal lobe near the vertex. Atrophy. Mild small vessel ischemic changes of the white matter. Stable ventricle size. Vascular: No hyperdense  vessels.  Carotid vascular calcification. Skull: Normal. Negative for fracture or focal lesion. Sinuses/Orbits: No acute finding. Other: None CT CERVICAL SPINE FINDINGS Alignment: No subluxation.  Facet alignment within normal limits. Skull base and vertebrae: Craniovertebral junction is intact. No fracture. Incomplete fusion posterior arch C2. Soft tissues and spinal canal: No prevertebral soft tissue thickening. Disc levels: Fusion at C3-C4. Moderate to marked degenerative changes C2-C3, C4-C5, C5-C6 Upper chest: Lung apices are clear. Heterogenous hypodensities within the thyroid. Other: None IMPRESSION: 1. No CT evidence for acute intracranial abnormality. Atrophy and small vessel ischemic changes of the white matter. Chronic infarcts bilateral right greater than left infarcts. 2. Degenerative changes of the cervical spine with fusion at C3-C4. No acute osseous abnormality Electronically Signed   By: Jasmine PangKim  Fujinaga M.D.   On: 06/28/2018 22:10   Ct Cervical Spine Wo Contrast  Result Date: 06/28/2018 CLINICAL DATA:  Weakness to the bilateral arms and legs EXAM: CT HEAD WITHOUT CONTRAST CT CERVICAL SPINE WITHOUT CONTRAST TECHNIQUE: Multidetector CT imaging of the head and cervical spine was performed following the standard protocol without intravenous contrast. Multiplanar CT image reconstructions of the cervical spine were also generated. COMPARISON:  MRI 04/19/2018, CT 04/18/2017, 04/16/2016 FINDINGS: CT HEAD FINDINGS Brain: No acute territorial infarction, hemorrhage or intracranial mass. Encephalomalacia in the right occipital and posterior temporal lobes. Small focus of encephalomalacia in the left parietal lobe near the vertex. Atrophy. Mild small vessel ischemic changes of the white matter. Stable ventricle size. Vascular: No hyperdense vessels.  Carotid vascular calcification. Skull: Normal. Negative for fracture or focal lesion. Sinuses/Orbits: No acute finding. Other: None CT CERVICAL SPINE FINDINGS  Alignment: No subluxation.  Facet alignment within normal  limits. Skull base and vertebrae: Craniovertebral junction is intact. No fracture. Incomplete fusion posterior arch C2. Soft tissues and spinal canal: No prevertebral soft tissue thickening. Disc levels: Fusion at C3-C4. Moderate to marked degenerative changes C2-C3, C4-C5, C5-C6 Upper chest: Lung apices are clear. Heterogenous hypodensities within the thyroid. Other: None IMPRESSION: 1. No CT evidence for acute intracranial abnormality. Atrophy and small vessel ischemic changes of the white matter. Chronic infarcts bilateral right greater than left infarcts. 2. Degenerative changes of the cervical spine with fusion at C3-C4. No acute osseous abnormality Electronically Signed   By: Jasmine Pang M.D.   On: 06/28/2018 22:10    EKG: Independently reviewed. SR 74bpm.  Assessment/Plan Principal Problem:   Syncope and collapse Active Problems:   CAD S/P LAD PCI July 2011   Hypertension, essential   IBS (irritable bowel syndrome)   DM type 2 (diabetes mellitus, type 2) (HCC)   Dyslipidemia   Hypothyroidism   CVA (cerebral vascular accident) (HCC)   Carotid artery disease (HCC)    1. Syncope versus possible seizure.  Patient appears to have had a possible syncopal episode-likely orthostatic.  She has had poor oral intake throughout the day due to some nausea, in the setting of IBS, and this may have led to the possible cause.  Very unlikely that this was a seizure or new stroke, but given past medical history of prior strokes will obtain brain MRI for full evaluation and allow permissive hypertension.  We will also obtain EEG and maintain on seizure precautions with Ativan as needed.  Neurochecks every 4 hours.  Will maintain on IV fluid overnight and check orthostatic vitals in a.m.  PT evaluation and fall precautions.  Maintain on aspirin and Plavix as well as current statin. Echo within normal limits on 12/2017 and will not repeat at this time.  Check lipids and A1c. Consider neurology consultation as needed. 2. Hypertension.  Hold home blood pressure medications for now and allow permissive hypertension with possibility of CVA. 3. Type 2 diabetes.  Hold home glimepiride and Januvia for now and maintain on carb modified diet with sliding scale insulin.  Patient is minimally hyperglycemic with blood glucose of 139. 4. Hypothyroidism.  Continue Synthroid. Check TSH. 5. Dyslipidemia.  Continue statin. 6. Prior CVA history and carotid artery disease.  It has been approximately 6 months since her last carotid ultrasound and therefore will obtain another carotid ultrasound.  Maintain on Plavix and aspirin. 7. Anxiety.  Maintain on Lexapro.   DVT prophylaxis: Lovenox Code Status: DNR Family Communication: None Disposition Plan:Syncope/Seizure evaluation; possible DC tomorrow if findings negative Consults called:None Admission status: Inpatient, SDU   Ismerai Bin D Sherryll Burger DO [facility] Pager [phone]  If 7PM-7AM, please contact night-coverage [email or web address] Password [facility]  06/29/2018, 12:03 AM

## 2018-06-28 NOTE — ED Triage Notes (Signed)
Pt brought in by [facility] for c/o weakness to bilateral arms and legs; pt states she fell and hit her head; pt cbg 151;

## 2018-06-29 ENCOUNTER — Inpatient Hospital Stay (HOSPITAL_COMMUNITY): Payer: Medicare HMO

## 2018-06-29 DIAGNOSIS — R569 Unspecified convulsions: Secondary | ICD-10-CM

## 2018-06-29 DIAGNOSIS — R55 Syncope and collapse: Secondary | ICD-10-CM | POA: Diagnosis not present

## 2018-06-29 LAB — CBC
HCT: 41.1 % (ref 36.0–46.0)
Hemoglobin: 13.5 g/dL (ref 12.0–15.0)
MCH: 27.5 pg (ref 26.0–34.0)
MCHC: 32.8 g/dL (ref 30.0–36.0)
MCV: 83.7 fL (ref 78.0–100.0)
Platelets: 209 10*3/uL (ref 150–400)
RBC: 4.91 MIL/uL (ref 3.87–5.11)
RDW: 13.9 % (ref 11.5–15.5)
WBC: 7.5 10*3/uL (ref 4.0–10.5)

## 2018-06-29 LAB — TSH: TSH: 4.428 u[IU]/mL (ref 0.350–4.500)

## 2018-06-29 LAB — LIPID PANEL
Cholesterol: 174 mg/dL (ref 0–200)
HDL: 46 mg/dL (ref >40)
LDL CALC: 98 mg/dL (ref 0–99)
TRIGLYCERIDES: 148 mg/dL (ref <150)
Total CHOL/HDL Ratio: 3.8 RATIO
VLDL: 30 mg/dL (ref 0–40)

## 2018-06-29 LAB — CBG MONITORING, ED
GLUCOSE-CAPILLARY: 145 mg/dL — AB (ref 70–99)
GLUCOSE-CAPILLARY: 159 mg/dL — AB (ref 70–99)
Glucose-Capillary: 159 mg/dL — ABNORMAL HIGH (ref 70–99)
Glucose-Capillary: 211 mg/dL — ABNORMAL HIGH (ref 70–99)

## 2018-06-29 LAB — BASIC METABOLIC PANEL
Anion gap: 9 (ref 5–15)
BUN: 9 mg/dL (ref 8–23)
CHLORIDE: 105 mmol/L (ref 98–111)
CO2: 27 mmol/L (ref 22–32)
Calcium: 8.8 mg/dL — ABNORMAL LOW (ref 8.9–10.3)
Creatinine, Ser: 0.89 mg/dL (ref 0.44–1.00)
GFR calc Af Amer: >60 mL/min (ref >60)
GFR calc non Af Amer: >60 mL/min (ref >60)
Glucose, Bld: 146 mg/dL — ABNORMAL HIGH (ref 70–99)
Potassium: 3.7 mmol/L (ref 3.5–5.1)
Sodium: 141 mmol/L (ref 135–145)

## 2018-06-29 LAB — HEMOGLOBIN A1C
Hgb A1c MFr Bld: 6.2 % — ABNORMAL HIGH (ref 4.8–5.6)
Mean Plasma Glucose: 131.24 mg/dL

## 2018-06-29 MED ORDER — LEVOTHYROXINE SODIUM 50 MCG PO TABS
ORAL_TABLET | ORAL | Status: AC
  Filled 2018-06-29: qty 1

## 2018-06-29 MED ORDER — B COMPLEX PO TABS: 1.0000 | ORAL_TABLET | Freq: Every day | ORAL | Status: DC

## 2018-06-29 MED ORDER — ENOXAPARIN SODIUM 40 MG/0.4ML ~~LOC~~ SOLN
40.0000 mg | SUBCUTANEOUS | Status: DC
  Administered 2018-06-29: 40 mg via SUBCUTANEOUS
  Filled 2018-06-29: qty 0.4

## 2018-06-29 MED ORDER — SODIUM CHLORIDE 0.9 % IV SOLN
INTRAVENOUS | Status: AC
  Administered 2018-06-29: 03:00:00 via INTRAVENOUS

## 2018-06-29 MED ORDER — INSULIN ASPART 100 UNIT/ML ~~LOC~~ SOLN: 0.0000 [IU] | Freq: Every day | SUBCUTANEOUS | Status: DC

## 2018-06-29 MED ORDER — INSULIN ASPART 100 UNIT/ML ~~LOC~~ SOLN
0.0000 [IU] | SUBCUTANEOUS | Status: DC
  Administered 2018-06-29: 1 [IU] via SUBCUTANEOUS
  Administered 2018-06-29: 2 [IU] via SUBCUTANEOUS
  Administered 2018-06-29: 3 [IU] via SUBCUTANEOUS
  Filled 2018-06-29 (×3): qty 1

## 2018-06-29 MED ORDER — CHROMIUM 200 MCG PO CAPS: 200.0000 ug | ORAL_CAPSULE | ORAL | Status: DC

## 2018-06-29 MED ORDER — LEVOTHYROXINE SODIUM 50 MCG PO TABS
25.0000 ug | ORAL_TABLET | Freq: Every day | ORAL | Status: DC
  Administered 2018-06-29: 25 ug via ORAL

## 2018-06-29 MED ORDER — CLOPIDOGREL BISULFATE 75 MG PO TABS
75.0000 mg | ORAL_TABLET | Freq: Every day | ORAL | Status: DC
  Administered 2018-06-29: 75 mg via ORAL
  Filled 2018-06-29: qty 1

## 2018-06-29 MED ORDER — COENZYME Q10 30 MG PO CAPS: 30.0000 mg | ORAL_CAPSULE | Freq: Every day | ORAL | Status: DC

## 2018-06-29 MED ORDER — ONDANSETRON HCL 4 MG/2ML IJ SOLN: 4.0000 mg | Freq: Four times a day (QID) | INTRAMUSCULAR | Status: DC | PRN

## 2018-06-29 MED ORDER — GABAPENTIN 400 MG PO CAPS
400.0000 mg | ORAL_CAPSULE | Freq: Two times a day (BID) | ORAL | Status: DC
  Administered 2018-06-29: 400 mg via ORAL
  Filled 2018-06-29: qty 1

## 2018-06-29 MED ORDER — ACETAMINOPHEN 650 MG RE SUPP: 650.0000 mg | Freq: Four times a day (QID) | RECTAL | Status: DC | PRN

## 2018-06-29 MED ORDER — DOCUSATE SODIUM 100 MG PO CAPS: 100.0000 mg | ORAL_CAPSULE | Freq: Every day | ORAL | Status: DC

## 2018-06-29 MED ORDER — ESCITALOPRAM OXALATE 10 MG PO TABS
10.0000 mg | ORAL_TABLET | Freq: Every day | ORAL | Status: DC
  Filled 2018-06-29 (×3): qty 1

## 2018-06-29 MED ORDER — ACETAMINOPHEN 325 MG PO TABS: 650.0000 mg | ORAL_TABLET | Freq: Four times a day (QID) | ORAL | Status: DC | PRN

## 2018-06-29 MED ORDER — ADULT MULTIVITAMIN W/MINERALS CH
ORAL_TABLET | Freq: Every day | ORAL | Status: DC
  Administered 2018-06-29: 1 via ORAL
  Filled 2018-06-29: qty 1

## 2018-06-29 MED ORDER — VITAMIN D3 25 MCG (1000 UNIT) PO TABS
1000.0000 [IU] | ORAL_TABLET | Freq: Every day | ORAL | Status: DC
  Filled 2018-06-29 (×3): qty 1

## 2018-06-29 MED ORDER — ATORVASTATIN CALCIUM 40 MG PO TABS: 40.0000 mg | ORAL_TABLET | Freq: Every day | ORAL | Status: DC

## 2018-06-29 MED ORDER — ONDANSETRON HCL 4 MG PO TABS: 4.0000 mg | ORAL_TABLET | Freq: Four times a day (QID) | ORAL | Status: DC | PRN

## 2018-06-29 MED ORDER — TEMAZEPAM 15 MG PO CAPS: 30.0000 mg | ORAL_CAPSULE | Freq: Every day | ORAL | Status: DC

## 2018-06-29 MED ORDER — ASPIRIN EC 81 MG PO TBEC
81.0000 mg | DELAYED_RELEASE_TABLET | Freq: Every day | ORAL | Status: DC
  Administered 2018-06-29: 81 mg via ORAL
  Filled 2018-06-29: qty 1

## 2018-06-29 MED ORDER — LORAZEPAM 2 MG/ML IJ SOLN: 2.0000 mg | Freq: Four times a day (QID) | INTRAMUSCULAR | Status: DC | PRN

## 2018-06-29 MED ORDER — HYDROXYZINE HCL 25 MG PO TABS: 25.0000 mg | ORAL_TABLET | Freq: Four times a day (QID) | ORAL | Status: DC | PRN

## 2018-06-29 NOTE — Plan of Care (Signed)
  Problem: Acute Rehab PT Goals(only PT should resolve) Goal: Pt Will Go Supine/Side To Sit Outcome: Progressing Flowsheets (Taken 06/29/2018 1237) Pt will go Supine/Side to Sit: with supervision Goal: Patient Will Transfer Sit To/From Stand Outcome: Progressing Flowsheets (Taken 06/29/2018 1237) Patient will transfer sit to/from stand: with min guard assist Goal: Pt Will Transfer Bed To Chair/Chair To Bed Outcome: Progressing Flowsheets (Taken 06/29/2018 1237) Pt will Transfer Bed to Chair/Chair to Bed: min guard assist Goal: Pt Will Ambulate Outcome: Progressing Flowsheets (Taken 06/29/2018 1237) Pt will Ambulate: 75 feet; with min guard assist; with rolling walker   12:37 PM, 06/29/18 Ocie BobJames Ladena Jacquez, MPT Physical Therapist with [facility] 336 [phone] office [phone] mobile phone

## 2018-06-29 NOTE — ED Notes (Signed)
PT in with pt.

## 2018-06-29 NOTE — ED Notes (Signed)
Pt a

## 2018-06-29 NOTE — Care Management CC44 (Signed)
Condition Code 44 Documentation Completed  Patient Details  Name: Shannon [name] MRN: [id number] Date of Birth: 04/03/1951   Condition Code 44 given:  Yes Patient signature on Condition Code 44 notice:  Yes Documentation of 2 MD's agreement:  Yes Code 44 added to claim:  Yes    Esau Fridman, Chrystine OilerSharley Diane, RN 06/29/2018, 4:22 PM

## 2018-06-29 NOTE — Care Management Obs Status (Signed)
MEDICARE OBSERVATION STATUS NOTIFICATION   Patient Details  Name: Shannon [name] MRN: [id number] Date of Birth: 12/05/1950   Medicare Observation Status Notification Given:  Yes    Jaramie Bastos, Chrystine OilerSharley Diane, RN 06/29/2018, 4:22 PM

## 2018-06-29 NOTE — ED Notes (Signed)
Spoke with patients son about recommendation of pt having 24 hour supervision, pt son states there is no one in the family capable of doing that.  informed pt's son that pt walked to bathroom with a walker without any problems. Also informed son if pt were to fall and hit her head she is at higher risk for a brain bleed d/t her being on blood thinners.  Pt son states he understands.  Pt instructed to use her walker when walking at all times.  Pt states she will.

## 2018-06-29 NOTE — Clinical Social Work Note (Signed)
Patient states that her arms and legs began to jerk and as a result she fell.  Patient states that she does not know what triggered this event.  Patient lives alone and is independent at baseline. She reported that [facility], Kym GroomCarrie Friese, came to her home on yesterday and discussed options. LCSW spoke with patient's daughter, Maralyn SagoSarah, who stated that her husband was currently out of town and that she would have to speak with him about what they could do to assist patient.  LCSW spoke with Kym Groomarrie Friese in regards to the case. Lyla SonCarrie stated that they received the case in regards to patient not taking her medications appropriately. She stated that they received a similar report in April in regards to the same issue. She stated that patient does not qualify for SNF nor ALF. She stated that [facility] could find a [facility] that would take patient for the $1800.00 that she receives per month. Patient stated that she was not interested in SN[facility] or [facility]. She stated that she wanted ALF.  LCSW stressed that she did not qualify for ALF due to her income making her ineligible for Medicaid. Patient stated that handicapp people are [facility] a [facility] and that she cannot deal with that.   Patient's son, Kennedy BuckerGrant, stated that hew as going out of town tomorrow and that if [place]ara was agreeable he would [place].  Maralyn SagoSarah stated that due to her no longer having a pull out couch and her son having had oral surgery today she could not assist her mother.   Kennedy BuckerGrant stated that he was getting off work at 3:00 and that he would pick patient up. LCSW provided [employer] contact number for [employer] Friese.  Kym GroomCarrie Friese will continue to work with patient [facility].   LCSW signing off.

## 2018-06-29 NOTE — ED Notes (Signed)
MRI transporter states pt is claustrophobic and does not want MRI

## 2018-06-29 NOTE — Discharge Summary (Signed)
Physician Discharge Summary  Shannon Stallionsther B Simer ZOX:[id number] DOB: 10/20/1951 DOA: 06/28/2018  PCP: Nila NephewGreen, Edwin, MD  Admit date: 06/28/2018 Discharge date: 06/29/2018  Time spent: 45 minutes  Recommendations for Outpatient Follow-up:  -Will be discharged home today. -Advised to follow up with PCP in 2 weeks.   Discharge Diagnoses:  Principal Problem:   Syncope and collapse Active Problems:   CAD S/P LAD PCI July 2011   Hypertension, essential   IBS (irritable bowel syndrome)   DM type 2 (diabetes mellitus, type 2) (HCC)   Dyslipidemia   Hypothyroidism   CVA (cerebral vascular accident) (HCC)   Carotid artery disease (HCC)   Discharge Condition: Stable and improved  Filed Weights   06/28/18 2003  Weight: 63.5 kg    History of present illness:  As per Dr. Sherryll BurgerShah on 8/20: Shannon [name] is a 67 y.o. female with medical history significant for prior CVA in February, type 2 diabetes, CAD, carotid artery disease, hypertension, IBS, dyslipidemia, and hypothyroidism who called EMS after she had an acute onset of jerking of her bilateral arms and legs while she was standing up this evening.  She appears to recall the entire episode and had no loss of consciousness.  The jerking movements caused her to fall backwards and hit her head causing some pain to the back of her head as well as left-sided neck pain.  Prior to this she states that she had been feeling quite fatigued and lightheaded throughout the day, but she had not been eating or drinking much as she was feeling somewhat nauseous.  She states that this is similar to how she felt prior to her CVA in February.  She denies any headache, weakness, new visual deficits, speech deficits, or swallow deficits.  There is some question of prior seizure history and prescription for Keppra, but this does not appear on her med list nor she taking the medication.   ED Course: Vital signs are stable in the emergency department laboratory data  remarkable only for an elevated hemoglobin of 15.2 and glucose of 139.  Urine analysis within normal limits.  Two-view chest x-ray with mild cardiomegaly no acute findings.  CT head with no acute findings.  She had been given some Tylenol and a 1 L fluid bolus.  Hospital Course:   Pre-syncopal Episode -Patient never had LOC. -No focal findings to suggest CVA.  Because of this MRI of the brain has not been ordered. -Unlikely to be seizure activity as she never had loss of consciousness, regardless would not recommend antiepileptic medication for first seizure episode. -Orthostatic vital signs are negative. -Carotid ultrasound shows less than 50% bilateral ICA stenosis with antegrade vertebral artery flow. -At this point etiology for presyncopal episode is unclear, I would not recommend dose reduction of her blood pressure medications at this time given her current vital signs.  But would however recommend close follow-up with outpatient provider for further recommendations. -Please note that a PT assessment has recommended SNF versus 24-hour supervision.  Social work assistance has been requested.  Apparently APS is involved in patient's case and has stated that patient does not qualify for SNF or ALF due to her income.  She would qualify for a family care home however patient refuses to be placed there.  Social work has spoken with patient's son and daughter to arrange care, son has agreed to pick patient up from the hospital.  They will have to arrange to provide 24-hour supervision at home to provide  optimal care at this time.  Hypertension -Continue home blood pressure medications.  Type 2 diabetes -Fair control.  Hypothyroidism -Continue Synthroid.  Hyperlipidemia -Continue statin.  Generalized anxiety disorder -Continue Lexapro.  Procedures:  None   Consultations:  None  Discharge Instructions  Discharge Instructions    Diet - low sodium heart healthy   Complete by:  As  directed    Increase activity slowly   Complete by:  As directed      Allergies as of 06/29/2018      Reactions   Insulins Other (See Comments)   Causes severe diarrhea   Sulfa Antibiotics Other (See Comments)   Childhood allergy, can't remember   Lisinopril Cough      Medication List    STOP taking these medications   metoprolol tartrate 25 MG tablet Commonly known as:  LOPRESSOR     TAKE these medications   acetaminophen 500 MG tablet Commonly known as:  TYLENOL Take 500 mg by mouth daily as needed for mild pain.   AMARYL 2 MG tablet Generic drug:  glimepiride Take 2 mg by mouth daily with breakfast.   amLODipine 10 MG tablet Commonly known as:  NORVASC Take 10 mg by mouth daily.   aspirin EC 81 MG tablet Take 1 tablet (81 mg total) by mouth daily.   atorvastatin 40 MG tablet Commonly known as:  LIPITOR Take 1 tablet (40 mg total) by mouth daily at 6 PM.   b complex vitamins tablet Take 1 tablet by mouth daily.   Chromium 200 MCG Caps Take 200 mcg by mouth every 30 (thirty) days.   clopidogrel 75 MG tablet Commonly known as:  PLAVIX TAKE ONE TABLET ONCE DAILY WITH BREAKFAST What changed:  See the new instructions.   co-enzyme Q-10 30 MG capsule Take 30 mg by mouth daily.   docusate sodium 100 MG capsule Commonly known as:  COLACE Take 100 mg by mouth at bedtime.   escitalopram 10 MG tablet Commonly known as:  LEXAPRO Take 1 tablet by mouth daily.   gabapentin 400 MG capsule Commonly known as:  NEURONTIN Take 400 mg by mouth 2 (two) times daily.   hydrOXYzine 25 MG tablet Commonly known as:  ATARAX/VISTARIL Take 25 mg by mouth every 6 (six) hours as needed for anxiety.   isosorbide mononitrate 30 MG 24 hr tablet Commonly known as:  IMDUR Take 1 tablet (30 mg total) by mouth daily. DUE APPT IN AUGUST, MAKE APPT FOR FURTHER REFILLS, CALL [phone] TO SCHEDULE   JANUVIA 100 MG tablet Generic drug:  sitaGLIPtin Take 100 mg by mouth daily.    ketorolac 0.4 % Soln Commonly known as:  ACULAR   levothyroxine 25 MCG tablet Commonly known as:  SYNTHROID, LEVOTHROID TAKE 1 TABLET (25 MCG TOTAL) BY MOUTH DAILY BEFORE BREAKFAST.   losartan 100 MG tablet Commonly known as:  COZAAR   metoprolol succinate 25 MG 24 hr tablet Commonly known as:  TOPROL-XL Take 2 tablets by mouth once a morning   multivitamin capsule Take 1 capsule by mouth daily. Senior vitamin   nitroGLYCERIN 0.4 MG SL tablet Commonly known as:  NITROSTAT Place 1 tablet (0.4 mg total) under the tongue every 5 (five) minutes as needed.   nystatin cream Commonly known as:  MYCOSTATIN Apply 1 application topically 2 (two) times daily.   temazepam 30 MG capsule Commonly known as:  RESTORIL Take 1 capsule (30 mg total) by mouth at bedtime.   Vitamin D-3 1000 units Caps Take 1,000  Units by mouth daily.      Allergies  Allergen Reactions  . Insulins Other (See Comments)    Causes severe diarrhea  . Sulfa Antibiotics Other (See Comments)    Childhood allergy, can't remember  . Lisinopril Cough   Follow-up Information    Nila NephewGreen, Edwin, MD. Schedule an appointment as soon as possible for a visit in 2 week(s).   Specialty:  Internal Medicine Contact information: [address], [address] [place] [address] [phone]            The results of significant diagnostics from this hospitalization (including imaging, microbiology, ancillary and laboratory) are listed below for reference.    Significant Diagnostic Studies: Dg Chest 2 View  Result Date: 06/28/2018 CLINICAL DATA:  Weakness, shaking today.  History of hypertension. EXAM: CHEST - 2 VIEW COMPARISON:  Chest radiograph April 18, 2018 FINDINGS: Cardiac silhouette is mildly enlarged and unchanged. Calcified aortic arch, mediastinal silhouette is normal. No pleural effusion or focal consolidation. Pulmonary vasculature is normal. No pneumothorax. Loop monitor projects LEFT chest wall.  Surgical clips in the included right abdomen compatible with cholecystectomy. Osteopenia. Probable loose body RIGHT shoulder. IMPRESSION: 1. Mild cardiomegaly.  No acute pulmonary process. 2.  Aortic Atherosclerosis (ICD10-I70.0). Electronically Signed   By: Awilda Metroourtnay  Bloomer M.D.   On: 06/28/2018 21:59   Ct Head Wo Contrast  Result Date: 06/28/2018 CLINICAL DATA:  Weakness to the bilateral arms and legs EXAM: CT HEAD WITHOUT CONTRAST CT CERVICAL SPINE WITHOUT CONTRAST TECHNIQUE: Multidetector CT imaging of the head and cervical spine was performed following the standard protocol without intravenous contrast. Multiplanar CT image reconstructions of the cervical spine were also generated. COMPARISON:  MRI 04/19/2018, CT 04/18/2017, 04/16/2016 FINDINGS: CT HEAD FINDINGS Brain: No acute territorial infarction, hemorrhage or intracranial mass. Encephalomalacia in the right occipital and posterior temporal lobes. Small focus of encephalomalacia in the left parietal lobe near the vertex. Atrophy. Mild small vessel ischemic changes of the white matter. Stable ventricle size. Vascular: No hyperdense vessels.  Carotid vascular calcification. Skull: Normal. Negative for fracture or focal lesion. Sinuses/Orbits: No acute finding. Other: None CT CERVICAL SPINE FINDINGS Alignment: No subluxation.  Facet alignment within normal limits. Skull base and vertebrae: Craniovertebral junction is intact. No fracture. Incomplete fusion posterior arch C2. Soft tissues and spinal canal: No prevertebral soft tissue thickening. Disc levels: Fusion at C3-C4. Moderate to marked degenerative changes C2-C3, C4-C5, C5-C6 Upper chest: Lung apices are clear. Heterogenous hypodensities within the thyroid. Other: None IMPRESSION: 1. No CT evidence for acute intracranial abnormality. Atrophy and small vessel ischemic changes of the white matter. Chronic infarcts bilateral right greater than left infarcts. 2. Degenerative changes of the cervical  spine with fusion at C3-C4. No acute osseous abnormality Electronically Signed   By: Jasmine PangKim  Fujinaga M.D.   On: 06/28/2018 22:10   Ct Cervical Spine Wo Contrast  Result Date: 06/28/2018 CLINICAL DATA:  Weakness to the bilateral arms and legs EXAM: CT HEAD WITHOUT CONTRAST CT CERVICAL SPINE WITHOUT CONTRAST TECHNIQUE: Multidetector CT imaging of the head and cervical spine was performed following the standard protocol without intravenous contrast. Multiplanar CT image reconstructions of the cervical spine were also generated. COMPARISON:  MRI 04/19/2018, CT 04/18/2017, 04/16/2016 FINDINGS: CT HEAD FINDINGS Brain: No acute territorial infarction, hemorrhage or intracranial mass. Encephalomalacia in the right occipital and posterior temporal lobes. Small focus of encephalomalacia in the left parietal lobe near the vertex. Atrophy. Mild small vessel ischemic changes of the white matter. Stable ventricle size.  Vascular: No hyperdense vessels.  Carotid vascular calcification. Skull: Normal. Negative for fracture or focal lesion. Sinuses/Orbits: No acute finding. Other: None CT CERVICAL SPINE FINDINGS Alignment: No subluxation.  Facet alignment within normal limits. Skull base and vertebrae: Craniovertebral junction is intact. No fracture. Incomplete fusion posterior arch C2. Soft tissues and spinal canal: No prevertebral soft tissue thickening. Disc levels: Fusion at C3-C4. Moderate to marked degenerative changes C2-C3, C4-C5, C5-C6 Upper chest: Lung apices are clear. Heterogenous hypodensities within the thyroid. Other: None IMPRESSION: 1. No CT evidence for acute intracranial abnormality. Atrophy and small vessel ischemic changes of the white matter. Chronic infarcts bilateral right greater than left infarcts. 2. Degenerative changes of the cervical spine with fusion at C3-C4. No acute osseous abnormality Electronically Signed   By: Jasmine Pang M.D.   On: 06/28/2018 22:10    Microbiology: No results found for  this or any previous visit (from the past 240 hour(s)).   Labs: Basic Metabolic Panel: Recent Labs  Lab 06/28/18 2056 06/29/18 0542  NA 138 141  K 3.9 3.7  CL 99 105  CO2 28 27  GLUCOSE 139* 146*  BUN 12 9  CREATININE 0.98 0.89  CALCIUM 9.5 8.8*   Liver Function Tests: Recent Labs  Lab 06/28/18 2056  AST 17  ALT 17  ALKPHOS 95  BILITOT 1.0  PROT 7.3  ALBUMIN 4.1   No results for input(s): LIPASE, AMYLASE in the last 168 hours. No results for input(s): AMMONIA in the last 168 hours. CBC: Recent Labs  Lab 06/28/18 2056 06/29/18 0542  WBC 8.6 7.5  NEUTROABS 5.8  --   HGB 15.2* 13.5  HCT 44.5 41.1  MCV 82.9 83.7  PLT 242 209   Cardiac Enzymes: Recent Labs  Lab 06/28/18 2114  TROPONINI <0.03   BNP: BNP (last 3 results) No results for input(s): BNP in the last 8760 hours.  ProBNP (last 3 results) No results for input(s): PROBNP in the last 8760 hours.  CBG: Recent Labs  Lab 06/29/18 0131 06/29/18 0434 06/29/18 0809  GLUCAP 159* 145* 159*       Signed:  Chaya Jan  [facility] Pager: [phone] 06/29/2018, 10:03 AM

## 2018-06-29 NOTE — ED Notes (Signed)
Per Dr.Hernandez

## 2018-06-29 NOTE — Evaluation (Signed)
Physical Therapy Evaluation Patient Details Name: Shannon [name] MRN: [id number] DOB: 03/03/1951 Today's Date: 06/29/2018   History of Present Illness  Shannon [name] is a 67 y/o female s/p fall possibly due to syncopal episode, hit back of head with c/o left sided neck pain, and c/o knot in right hamstrings.  PMHx: prior CVA 2/19, DM type 2, CAD, HTN, IBS, dyslipidemia, and hypothyroidism    Clinical Impression  Patient functioning below baseline requiring assistance to sit up at bedside demonstrating slow unsteady shaky movement, c/o dizziness upon sitting up that did not worsen during ambulation, limited mostly due to c/o fatigue and neck, head, and pain in right hamstrings.  Patient put back to bed after therapy.  Patient will benefit from continued physical therapy in hospital and recommended venue below to increase strength, balance, endurance for safe ADLs and gait.    Follow Up Recommendations SNF;Supervision/Assistance - 24 hour    Equipment Recommendations  None recommended by PT    Recommendations for Other Services       Precautions / Restrictions Precautions Precautions: Fall Restrictions Weight Bearing Restrictions: No      Mobility  Bed Mobility Overal bed mobility: Needs Assistance Bed Mobility: Supine to Sit;Sit to Supine     Supine to sit: Min assist Sit to supine: Min guard      Transfers Overall transfer level: Needs assistance Equipment used: Rolling walker (2 wheeled) Transfers: Sit to/from Stand Sit to Stand: Min assist         General transfer comment: slow labored movement  Ambulation/Gait Ambulation/Gait assistance: Editor, commissioningMin assist Gait Distance (Feet): 45 Feet Assistive device: Rolling walker (2 wheeled) Gait Pattern/deviations: Decreased step length - right;Decreased step length - left;Decreased stride length Gait velocity: slow   General Gait Details: slow labored unsteady cadence with c/o discomfort right hamstring "knot" like feeling,  limited secondary to fatigue & pain  Stairs            Wheelchair Mobility    Modified Rankin (Stroke Patients Only)       Balance Overall balance assessment: Needs assistance Sitting-balance support: No upper extremity supported;Feet supported Sitting balance-Leahy Scale: Good     Standing balance support: Bilateral upper extremity supported;During functional activity Standing balance-Leahy Scale: Fair Standing balance comment: using RW                             Pertinent Vitals/Pain Pain Assessment: 0-10 Pain Score: 8 (back of head, left side of neck, right hamstrings) Pain Location: back of head, left side of neck, right hamstring Pain Descriptors / Indicators: Cramping;Discomfort;Sore Pain Intervention(s): Limited activity within patient's tolerance;Monitored during session    Home Living Family/patient expects to be discharged to:: Private residence Living Arrangements: Alone Available Help at Discharge: Family Type of Home: Mobile home Home Access: Stairs to enter Entrance Stairs-Rails: Right;Left;Can reach both Entrance Stairs-Number of Steps: 5 Home Layout: One level Home Equipment: Walker - 2 wheels;Walker - 4 wheels;Cane - single point;Bedside commode;Shower seat      Prior Function Level of Independence: Independent with assistive device(s)         Comments: household & short distanced community ambulator with Rollator     Hand Dominance        Extremity/Trunk Assessment   Upper Extremity Assessment Upper Extremity Assessment: Overall WFL for tasks assessed    Lower Extremity Assessment Lower Extremity Assessment: Generalized weakness    Cervical / Trunk Assessment Cervical / Trunk  Assessment: Normal  Communication   Communication: No difficulties  Cognition Arousal/Alertness: Awake/alert Behavior During Therapy: WFL for tasks assessed/performed;Anxious Overall Cognitive Status: Within Functional Limits for tasks  assessed                                        General Comments      Exercises     Assessment/Plan    PT Assessment Patient needs continued PT services  PT Problem List Decreased strength;Decreased activity tolerance;Decreased balance;Decreased mobility       PT Treatment Interventions Gait training;Stair training;Functional mobility training;Therapeutic activities;Therapeutic exercise;Patient/family education    PT Goals (Current goals can be found in the Care Plan section)  Acute Rehab PT Goals Patient Stated Goal: return home able to take care of self PT Goal Formulation: With patient Time For Goal Achievement: 07/13/18    Frequency Min 3X/week   Barriers to discharge        Co-evaluation               AM-PAC PT "6 Clicks" Daily Activity  Outcome Measure Difficulty turning over in bed (including adjusting bedclothes, sheets and blankets)?: None Difficulty moving from lying on back to sitting on the side of the bed? : A Little Difficulty sitting down on and standing up from a chair with arms (e.g., wheelchair, bedside commode, etc,.)?: A Little Help needed moving to and from a bed to chair (including a wheelchair)?: A Little Help needed walking in hospital room?: A Little Help needed climbing 3-5 steps with a railing? : A Lot 6 Click Score: 18    End of Session Equipment Utilized During Treatment: Gait belt Activity Tolerance: Patient tolerated treatment well;Patient limited by fatigue;Patient limited by pain Patient left: in bed;with call bell/phone within reach Nurse Communication: Mobility status PT Visit Diagnosis: Unsteadiness on feet (R26.81);Other abnormalities of gait and mobility (R26.89);Muscle weakness (generalized) (M62.81)    Time: [id number] PT Time Calculation (min) (ACUTE ONLY): 25 min   Charges:     PT Treatments $Therapeutic Activity: 23-37 mins        12:36 PM, 06/29/18 Ocie BobJames Samaa Ueda, MPT Physical Therapist  with [facility] 336 [phone] office [phone] mobile phone

## 2018-06-29 NOTE — ED Notes (Signed)
Pt ambulated to restroom and back with assistance of a walker with no problems

## 2018-06-29 NOTE — ED Notes (Signed)
Given lunch tray.

## 2018-06-29 NOTE — Progress Notes (Signed)
Per Dr Ardyth HarpsHernandez, EEG to be discontinued. Pt ready for discharge.

## 2018-06-29 NOTE — ED Notes (Signed)
Dr. Ardyth HarpsHernandez made aware of pt c/o dizziness with standing and difficulty with ambulation.

## 2018-06-30 LAB — HIV ANTIBODY (ROUTINE TESTING W REFLEX): HIV Screen 4th Generation wRfx: NONREACTIVE

## 2018-07-19 ENCOUNTER — Other Ambulatory Visit: Payer: Self-pay | Admitting: Internal Medicine

## 2018-07-19 ENCOUNTER — Ambulatory Visit
Admission: RE | Admit: 2018-07-19 | Discharge: 2018-07-19 | Disposition: A | Discharge status: Home / Self Care | Payer: Medicare HMO | Source: Ambulatory Visit | Attending: Internal Medicine | Admitting: Internal Medicine

## 2018-07-19 DIAGNOSIS — R229 Localized swelling, mass and lump, unspecified: Principal | ICD-10-CM

## 2018-07-19 DIAGNOSIS — IMO0002 Reserved for concepts with insufficient information to code with codable children: Secondary | ICD-10-CM

## 2018-07-20 ENCOUNTER — Emergency Department (HOSPITAL_COMMUNITY): Payer: Medicare HMO

## 2018-07-20 ENCOUNTER — Observation Stay (HOSPITAL_COMMUNITY): Payer: Medicare HMO

## 2018-07-20 ENCOUNTER — Encounter (HOSPITAL_COMMUNITY): Payer: Self-pay | Admitting: Emergency Medicine

## 2018-07-20 ENCOUNTER — Other Ambulatory Visit: Payer: Self-pay

## 2018-07-20 ENCOUNTER — Observation Stay (HOSPITAL_COMMUNITY)
Admission: EM | Admit: 2018-07-20 | Discharge: 2018-07-20 | Disposition: A | Discharge status: Home / Self Care | Payer: Medicare HMO | Attending: Physician Assistant | Admitting: Physician Assistant

## 2018-07-20 DIAGNOSIS — I6330 Cerebral infarction due to thrombosis of unspecified cerebral artery: Secondary | ICD-10-CM | POA: Diagnosis present

## 2018-07-20 DIAGNOSIS — M797 Fibromyalgia: Secondary | ICD-10-CM | POA: Diagnosis not present

## 2018-07-20 DIAGNOSIS — Z882 Allergy status to sulfonamides status: Secondary | ICD-10-CM | POA: Diagnosis not present

## 2018-07-20 DIAGNOSIS — F329 Major depressive disorder, single episode, unspecified: Secondary | ICD-10-CM | POA: Diagnosis not present

## 2018-07-20 DIAGNOSIS — Z79899 Other long term (current) drug therapy: Secondary | ICD-10-CM | POA: Insufficient documentation

## 2018-07-20 DIAGNOSIS — E039 Hypothyroidism, unspecified: Secondary | ICD-10-CM | POA: Insufficient documentation

## 2018-07-20 DIAGNOSIS — I10 Essential (primary) hypertension: Secondary | ICD-10-CM | POA: Diagnosis not present

## 2018-07-20 DIAGNOSIS — Z7982 Long term (current) use of aspirin: Secondary | ICD-10-CM | POA: Diagnosis not present

## 2018-07-20 DIAGNOSIS — Z888 Allergy status to other drugs, medicaments and biological substances status: Secondary | ICD-10-CM | POA: Insufficient documentation

## 2018-07-20 DIAGNOSIS — Z7901 Long term (current) use of anticoagulants: Secondary | ICD-10-CM | POA: Diagnosis not present

## 2018-07-20 DIAGNOSIS — E119 Type 2 diabetes mellitus without complications: Secondary | ICD-10-CM | POA: Insufficient documentation

## 2018-07-20 DIAGNOSIS — Z8249 Family history of ischemic heart disease and other diseases of the circulatory system: Secondary | ICD-10-CM | POA: Insufficient documentation

## 2018-07-20 DIAGNOSIS — S065XAA Traumatic subdural hemorrhage with loss of consciousness status unknown, initial encounter: Secondary | ICD-10-CM | POA: Diagnosis present

## 2018-07-20 DIAGNOSIS — F419 Anxiety disorder, unspecified: Secondary | ICD-10-CM | POA: Insufficient documentation

## 2018-07-20 DIAGNOSIS — R569 Unspecified convulsions: Secondary | ICD-10-CM | POA: Diagnosis not present

## 2018-07-20 DIAGNOSIS — K589 Irritable bowel syndrome without diarrhea: Secondary | ICD-10-CM | POA: Diagnosis not present

## 2018-07-20 DIAGNOSIS — W19XXXA Unspecified fall, initial encounter: Secondary | ICD-10-CM | POA: Diagnosis not present

## 2018-07-20 DIAGNOSIS — Y92531 Health care provider office as the place of occurrence of the external cause: Secondary | ICD-10-CM | POA: Diagnosis not present

## 2018-07-20 DIAGNOSIS — Z794 Long term (current) use of insulin: Secondary | ICD-10-CM | POA: Insufficient documentation

## 2018-07-20 DIAGNOSIS — Z7902 Long term (current) use of antithrombotics/antiplatelets: Secondary | ICD-10-CM | POA: Insufficient documentation

## 2018-07-20 DIAGNOSIS — S065X9A Traumatic subdural hemorrhage with loss of consciousness of unspecified duration, initial encounter: Secondary | ICD-10-CM | POA: Diagnosis present

## 2018-07-20 DIAGNOSIS — M5136 Other intervertebral disc degeneration, lumbar region: Secondary | ICD-10-CM | POA: Diagnosis not present

## 2018-07-20 DIAGNOSIS — I639 Cerebral infarction, unspecified: Secondary | ICD-10-CM | POA: Diagnosis present

## 2018-07-20 DIAGNOSIS — Z981 Arthrodesis status: Secondary | ICD-10-CM | POA: Diagnosis not present

## 2018-07-20 DIAGNOSIS — E785 Hyperlipidemia, unspecified: Secondary | ICD-10-CM | POA: Diagnosis not present

## 2018-07-20 DIAGNOSIS — Z955 Presence of coronary angioplasty implant and graft: Secondary | ICD-10-CM | POA: Insufficient documentation

## 2018-07-20 DIAGNOSIS — I2510 Atherosclerotic heart disease of native coronary artery without angina pectoris: Secondary | ICD-10-CM | POA: Insufficient documentation

## 2018-07-20 DIAGNOSIS — Z9861 Coronary angioplasty status: Secondary | ICD-10-CM

## 2018-07-20 LAB — CBC WITH DIFFERENTIAL/PLATELET
Basophils Absolute: 0 10*3/uL (ref 0.0–0.1)
Basophils Relative: 0 %
Eosinophils Absolute: 0.4 10*3/uL (ref 0.0–0.7)
Eosinophils Relative: 4 %
HEMATOCRIT: 42.7 % (ref 36.0–46.0)
HEMOGLOBIN: 14.2 g/dL (ref 12.0–15.0)
LYMPHS ABS: 2.5 10*3/uL (ref 0.7–4.0)
LYMPHS PCT: 26 %
MCH: 26.9 pg (ref 26.0–34.0)
MCHC: 33.3 g/dL (ref 30.0–36.0)
MCV: 81 fL (ref 78.0–100.0)
Monocytes Absolute: 0.8 10*3/uL (ref 0.1–1.0)
Monocytes Relative: 8 %
NEUTROS ABS: 6 10*3/uL (ref 1.7–7.7)
NEUTROS PCT: 62 %
Platelets: 224 10*3/uL (ref 150–400)
RBC: 5.27 MIL/uL — AB (ref 3.87–5.11)
RDW: 13.8 % (ref 11.5–15.5)
WBC: 9.7 10*3/uL (ref 4.0–10.5)

## 2018-07-20 LAB — COMPREHENSIVE METABOLIC PANEL
ALK PHOS: 77 U/L (ref 38–126)
ALT: 18 U/L (ref 0–44)
AST: 19 U/L (ref 15–41)
Albumin: 4.2 g/dL (ref 3.5–5.0)
Anion gap: 8 (ref 5–15)
BUN: 10 mg/dL (ref 8–23)
CALCIUM: 9.4 mg/dL (ref 8.9–10.3)
CO2: 28 mmol/L (ref 22–32)
CREATININE: 0.97 mg/dL (ref 0.44–1.00)
Chloride: 106 mmol/L (ref 98–111)
GFR, EST NON AFRICAN AMERICAN: 58 mL/min — AB (ref >60)
Glucose, Bld: 105 mg/dL — ABNORMAL HIGH (ref 70–99)
Potassium: 3.3 mmol/L — ABNORMAL LOW (ref 3.5–5.1)
Sodium: 142 mmol/L (ref 135–145)
Total Bilirubin: 1 mg/dL (ref 0.3–1.2)
Total Protein: 7.4 g/dL (ref 6.5–8.1)

## 2018-07-20 LAB — PROTIME-INR
INR: 1
PROTHROMBIN TIME: 13.1 s (ref 11.4–15.2)

## 2018-07-20 LAB — TYPE AND SCREEN
ABO/RH(D): A POS
ANTIBODY SCREEN: NEGATIVE

## 2018-07-20 LAB — APTT: aPTT: 27 seconds (ref 24–36)

## 2018-07-20 MED ORDER — LEVETIRACETAM IN NACL 500 MG/100ML IV SOLN
500.0000 mg | Freq: Once | INTRAVENOUS | Status: DC
  Filled 2018-07-20: qty 100

## 2018-07-20 MED ORDER — HYDROXYZINE HCL 25 MG PO TABS: 25.0000 mg | ORAL_TABLET | Freq: Four times a day (QID) | ORAL | Status: DC | PRN

## 2018-07-20 MED ORDER — ACETAMINOPHEN 500 MG PO TABS
1000.0000 mg | ORAL_TABLET | Freq: Once | ORAL | Status: AC
  Administered 2018-07-20: 1000 mg via ORAL
  Filled 2018-07-20: qty 2

## 2018-07-20 MED ORDER — SODIUM CHLORIDE 0.9 % IV SOLN
0.3000 ug/kg | Freq: Once | INTRAVENOUS | Status: AC
  Administered 2018-07-20: 19.2 ug via INTRAVENOUS
  Filled 2018-07-20: qty 4.8

## 2018-07-20 MED ORDER — LEVETIRACETAM IN NACL 1000 MG/100ML IV SOLN
1000.0000 mg | Freq: Once | INTRAVENOUS | Status: AC
  Administered 2018-07-20: 1000 mg via INTRAVENOUS
  Filled 2018-07-20: qty 100

## 2018-07-20 MED ORDER — GABAPENTIN 400 MG PO CAPS: 400.0000 mg | ORAL_CAPSULE | Freq: Two times a day (BID) | ORAL | Status: DC

## 2018-07-20 MED ORDER — ACETAMINOPHEN 325 MG PO TABS: 650.0000 mg | ORAL_TABLET | ORAL | Status: DC | PRN

## 2018-07-20 MED ORDER — LEVETIRACETAM 500 MG PO TABS: 500.0000 mg | ORAL_TABLET | Freq: Two times a day (BID) | ORAL | 0 refills | Status: AC

## 2018-07-20 MED ORDER — METOPROLOL TARTRATE 25 MG PO TABS: 25.0000 mg | ORAL_TABLET | Freq: Two times a day (BID) | ORAL | Status: DC

## 2018-07-20 MED ORDER — NITROGLYCERIN 0.4 MG SL SUBL: 0.4000 mg | SUBLINGUAL_TABLET | SUBLINGUAL | Status: DC | PRN

## 2018-07-20 MED ORDER — SODIUM CHLORIDE 0.9% IV SOLUTION: Freq: Once | INTRAVENOUS | Status: DC

## 2018-07-20 MED ORDER — ESCITALOPRAM OXALATE 10 MG PO TABS: 10.0000 mg | ORAL_TABLET | Freq: Every day | ORAL | Status: DC

## 2018-07-20 MED ORDER — INSULIN ASPART 100 UNIT/ML ~~LOC~~ SOLN: 2.0000 [IU] | Freq: Once | SUBCUTANEOUS | Status: DC

## 2018-07-20 MED ORDER — ATORVASTATIN CALCIUM 40 MG PO TABS: 40.0000 mg | ORAL_TABLET | Freq: Every day | ORAL | Status: DC

## 2018-07-20 MED ORDER — POTASSIUM CHLORIDE CRYS ER 20 MEQ PO TBCR
40.0000 meq | EXTENDED_RELEASE_TABLET | Freq: Once | ORAL | Status: AC
  Administered 2018-07-20: 40 meq via ORAL
  Filled 2018-07-20: qty 2

## 2018-07-20 MED ORDER — ISOSORBIDE MONONITRATE ER 30 MG PO TB24: 30.0000 mg | ORAL_TABLET | Freq: Every day | ORAL | Status: DC

## 2018-07-20 MED ORDER — AMLODIPINE BESYLATE 5 MG PO TABS
10.0000 mg | ORAL_TABLET | Freq: Every day | ORAL | Status: DC
  Administered 2018-07-20: 10 mg via ORAL
  Filled 2018-07-20: qty 2

## 2018-07-20 NOTE — ED Notes (Signed)
Hospitalist at bedside 

## 2018-07-20 NOTE — ED Notes (Signed)
Pt assisted to BSC

## 2018-07-20 NOTE — Progress Notes (Signed)
Received call from Dr Lynelle Doctor, EDP at [facility] ER regarding patient Shannon [name] is a 67 year old female with history of CVA (on plavix and ASA), CAD s/p PCI LAD, DM, HTN and new diagnosis of ?seizures who presented to ER after she had a seizure yesterday afternoon and fell striking her head. By report she is neuro intact with exception of difficulties with memory, although question underlying dementia.  I have reviewed the CT scan of her head which is significant for 21mm acute falx SDH. There is no MLS. Minimal mass effect. She is on plavix and ASA. Dr Lynelle Doctor has ordered DDAVP and platelet transfusion. There is no role for acute NS intervention.  While intracranial bleeds can cause seizures, it appears her seizure history stems back several months and with a normal head CT 8/20, I do not suspect the ICH as the cause for her seizures. Patient can be admitted for observation under TH for monitoring with a a repeat head CT later this afternoon which will be 24 hours after the initial injury.  Rec loading with Keppra 1g as it appears she has not started her keppra that was rx by her PCP yesterday. Continue Keppra 500mg  BID. Will defer how long and further work up to primary team for admitting.  Please call for any concerns.

## 2018-07-20 NOTE — Discharge Instructions (Signed)
1)Due to Bleeding in your head/brain avoid Plavix and aspirin until see by NeuroSurgeon Dr Conchita Paris in about 3 weeks for recheck 2)You have bleeding in your brain/head so also  Avoid ibuprofen/Advil/Aleve/Motrin/Goody Powders/Naproxen/BC powders/Meloxicam/Diclofenac/Indomethacin and other Nonsteroidal anti-inflammatory medications as these will make you more likely to bleed and can cause stomach ulcers, can also cause Kidney problems.  3)Follow with physician in 3 to 5 days for recheck, also follow-up with neurosurgeon Dr Conchita Paris in no more than 3 weeks from now

## 2018-07-20 NOTE — ED Notes (Signed)
Patient transported to CT. Spoke with Neuro PA and gave order to repeat CT at this time

## 2018-07-20 NOTE — Progress Notes (Signed)
Patient still not at [facility] from [facility]. Dr Mariea Clonts was kind enough to see and evaluate the patient earlier today after there was concern about a syncopal episode. He was able to talk to patient's caregiver. Appears SDH is traumatic secondary to mechanical fall. Appreciate assistance in care.   A repeat head CT has been obtained and >18 hours after initial injury. I have reviewed the CT ordered at 3:17pm and compared it to the CT ordered earlier this am alongside my attending, Dr Conchita Paris. There is no significant change in SDH. There is no indication for NS intervention. No need for transfer at this time.  She can f/u outpt with repeat head CT in 3 weeks. Complete 7 day course of Keppra for seizure prophylaxis.

## 2018-07-20 NOTE — ED Notes (Signed)
So to pick pt up patient

## 2018-07-20 NOTE — ED Triage Notes (Signed)
Per ems pt was seen at pcp today and was given a prescription to start keppra on 9/11. Pt states she had a seizure and fell outside in grass after her dr appointment today, states she has pain in her back and right shoulder. Pt states she has tremors but seems to be getting worse.

## 2018-07-20 NOTE — ED Provider Notes (Signed)
[facility] EMERGENCY DEPARTMENT Provider Note   CSN: [id number] Arrival date & time: 07/20/18  0140  Time seen 04:00 AM   History   Chief Complaint Chief Complaint  Patient presents with  . Seizures   Level 5 caveat due to altered mental status.  HPI Shannon [name] is a 67 y.o. female.  HPI when I talk to patient she apologizes and states "I just cannot talk well".  When asked her what happened today she states "same thing".  She does not recall but per the nursing notes they were told she was at her primary care doctor today and was given a prescription of Keppra to start.  She states she has not had it filled yet.  They also report that she had a seizure and fell outside the doctor's office and she was complaining of back pain.  When I asked her she states her back hurts "all over".  She states she has a headache and points to the back of her head.  She states her upper back hurts.  She states her neck hurts on the left side.  She denies nausea or vomiting.  PCP Nila Nephew, MD    Past Medical History:  Diagnosis Date  . Anxiety   . Arthritis   . Coronary artery disease    a. s/p PCI with DES to LAD in 2011  (LHC 03/2010: Mid LAD 70%, normal ejection fraction  >> PCI:  2.75 x 15 mm Promus DES to LAD); b. Nuclear Stress Test 05/29/14: No ischemia, EF 59%, Normal; c. NSTEMI 11/2014 >> patient declined LHC b/c of recent CVA  . Depression   . Diabetes mellitus without complication (HCC)   . Dyslipidemia 11/20/2014  . Fibromyalgia 1995  . H/O ischemic right PCA stroke 10/2014   a. Carotid US 10/24/14: No significant ICA stenosis bilaterally  . Hemiparesis and alteration of sensations as late effects of stroke (HCC) 06/05/2015  . Hx of echocardiogram    a. Echocardiogram 10/25/14: Moderate LVH, EF 50-55%, normal wall motion, grade 2 diastolic dysfunction, mild LAE, atrial septal lipomatous hypertrophy  . Hypertension   . Irritable bowel     Patient Active Problem List   Diagnosis Date Noted  . Syncope and collapse 06/29/2018  . Seizures (HCC) 06/29/2018  . Seizure (HCC) 06/28/2018  . Carotid artery disease (HCC) 06/28/2018  . Left ankle pain and swelling 12/28/2017  . CVA (cerebral vascular accident) (HCC) 12/27/2017  . Malnutrition of moderate degree 06/09/2017  . CVA (cerebrovascular accident) (HCC) 04/10/2017  . Dizziness 04/09/2017  . Hemiparesis and alteration of sensations as late effects of stroke (HCC) 06/05/2015  . Numbness 12/27/2014  . Visual disturbance 11/26/2014  . Dyslipidemia 11/20/2014  . Hypothyroidism 11/20/2014  . Pain   . Cerebral thrombosis with cerebral infarction (HCC) 11/19/2014  . Weakness   . NSTEMI (non-ST elevated myocardial infarction) (HCC) 11/17/2014  . Acute CVA -Dec 2015 10/24/2014  . DM type 2 (diabetes mellitus, type 2) (HCC) 10/24/2014  . Accelerated hypertension 10/24/2014  . H/O ischemic right PCA stroke 10/09/2014  . Atypical chest pain 05/16/2014  . CAD S/P LAD PCI July 2011 07/01/2013  . Hypertension, essential 07/01/2013  . Fibromyalgia 07/01/2013  . IBS (irritable bowel syndrome) 07/01/2013    Past Surgical History:  Procedure Laterality Date  . CHOLECYSTECTOMY    . CORONARY STENT PLACEMENT    . LOOP RECORDER INSERTION N/A 06/23/2017   Procedure: Loop Recorder Insertion;  Surgeon: Duke Salvia, MD;  Location: [facility]  INVASIVE CV LAB;  Service: Cardiovascular;  Laterality: N/A;  . orif arm fracture Right    forearm  . TEE WITHOUT CARDIOVERSION N/A 04/12/2017   Procedure: TRANSESOPHAGEAL ECHOCARDIOGRAM (TEE) WITH PROPOFOL;  Surgeon: Jonelle Sidle, MD;  Location: [facility] ENDO SUITE;  Service: Cardiovascular;  Laterality: N/A;     OB History    Gravida  12   Para  4   Term  4   Preterm      AB  8   Living        SAB      TAB      Ectopic      Multiple      Live Births               Home Medications    Prior to Admission medications   Medication Sig Start Date End Date  Taking? Authorizing Provider  acetaminophen (TYLENOL) 500 MG tablet Take 500 mg by mouth daily as needed for mild pain.    [provider]  amLODipine (NORVASC) 10 MG tablet Take 10 mg by mouth daily. 09/11/16   [provider]  aspirin EC 81 MG tablet Take 1 tablet (81 mg total) by mouth daily. 11/27/14   Tereso Newcomer T, PA-C  atorvastatin (LIPITOR) 40 MG tablet Take 1 tablet (40 mg total) by mouth daily at 6 PM. 12/30/17   Erick Blinks, MD  clopidogrel (PLAVIX) 75 MG tablet TAKE ONE TABLET ONCE DAILY WITH BREAKFAST Patient taking differently: Take 75 mg by mouth daily.  08/13/17   Jake Bathe, MD  escitalopram (LEXAPRO) 10 MG tablet Take 1 tablet by mouth daily. 12/01/17   [provider]  gabapentin (NEURONTIN) 400 MG capsule Take 400 mg by mouth 2 (two) times daily. 07/29/16   [provider]  glimepiride (AMARYL) 2 MG tablet Take 2 mg by mouth daily with breakfast.    [provider]  hydrOXYzine (ATARAX/VISTARIL) 25 MG tablet Take 25-50 mg by mouth See admin instructions. 25mg  in the morning, noon and at bedtime. Take 50mg  in the evening only 06/10/18   [provider]  isosorbide mononitrate (IMDUR) 30 MG 24 hr tablet Take 1 tablet (30 mg total) by mouth daily. DUE APPT IN AUGUST, MAKE APPT FOR FURTHER REFILLS, CALL [phone] TO SCHEDULE Patient taking differently: Take 30 mg by mouth daily.  06/12/17   Houston Siren, MD  JANUVIA 100 MG tablet Take 100 mg by mouth daily. 09/11/16   [provider]  Multiple Vitamin (MULTIVITAMIN) capsule Take 1 capsule by mouth daily. Senior vitamin    [provider]  nitroGLYCERIN (NITROSTAT) 0.4 MG SL tablet Place 1 tablet (0.4 mg total) under the tongue every 5 (five) minutes as needed. 06/12/17   Houston Siren, MD  nystatin cream (MYCOSTATIN) Apply 1 application topically 2 (two) times daily.    [provider]  temazepam (RESTORIL) 30 MG capsule Take 1 capsule (30 mg total) by mouth  at bedtime. 01/05/18   Edmon Crape C, PA-C  isosorbide dinitrate (ISORDIL) 30 MG tablet Take 1 tablet (30 mg total) by mouth every morning. 03/14/14 07/13/14  Ambrose Finland, NP    Family History Family History  Problem Relation Age of Onset  . Cancer Mother   . Diabetes Mother   . Hypertension Mother   . Heart failure Mother   . CAD Mother   . Heart attack Mother   . Cancer Father   . Diabetes Father   . Hypertension  Father   . Heart failure Father   . CAD Father   . Heart attack Father   . Stroke Father   . CAD Brother   . Diabetes Brother   . Hypertension Brother   . CAD Sister   . Diabetes Sister   . Hypertension Sister   . Heart attack Sister   . Heart attack Brother   . Stroke Maternal Aunt     Social History Social History   Tobacco Use  . Smoking status: Never Smoker  . Smokeless tobacco: Never Used  Substance Use Topics  . Alcohol use: No    Alcohol/week: 0.0 standard drinks  . Drug use: No  Patient states she lives alone  Allergies   Insulins; Sulfa antibiotics; and Lisinopril   Review of Systems Review of Systems  Unable to perform ROS: Mental status change     Physical Exam Updated Vital Signs BP (!) 150/55   Pulse 70   Temp 98 F (36.7 C) (Oral)   Resp 12   Ht 5\' 3"  (1.6 m)   Wt 63.5 kg   SpO2 95%   BMI 24.80 kg/m   Vital signs normal    Physical Exam  Constitutional: She appears well-developed and well-nourished.  Non-toxic appearance. She does not appear ill. No distress.  Patient is awake and alert however she seems to have difficulty expressing herself and answering my questions.  I was not sure whether she has some underlying dementia going on.  HENT:  Head: Normocephalic and atraumatic.  Right Ear: External ear normal.  Left Ear: External ear normal.  Nose: Nose normal. No mucosal edema or rhinorrhea.  Mouth/Throat: Oropharynx is clear and moist and mucous membranes are normal. No dental abscesses or uvula swelling.  Eyes:  Pupils are equal, round, and reactive to light. Conjunctivae and EOM are normal.  Neck: Normal range of motion and full passive range of motion without pain. Neck supple.  Patient has some tenderness on the left side of her neck.  Cardiovascular: Normal rate, regular rhythm and normal heart sounds. Exam reveals no gallop and no friction rub.  No murmur heard. Pulmonary/Chest: Effort normal and breath sounds normal. No respiratory distress. She has no wheezes. She has no rhonchi. She has no rales. She exhibits no tenderness and no crepitus.  Abdominal: Soft. Normal appearance and bowel sounds are normal. She exhibits no distension. There is no tenderness. There is no rebound and no guarding.  Musculoskeletal: Normal range of motion. She exhibits no edema or tenderness.  Moves all extremities well.  Patient has some tenderness in her upper thoracic and mid thoracic spine diffusely without localization.  Her upper lumbar spine is nontender however she does have some pain in the sacral area of the lumbar spine.  Neurological: She is alert. She has normal strength. No cranial nerve deficit.  Cooperative  Skin: Skin is warm, dry and intact. No rash noted. No erythema. No pallor.  Psychiatric: She has a normal mood and affect. Her speech is normal and behavior is normal. Her mood appears not anxious.  Nursing note and vitals reviewed.    ED Treatments / Results  Labs (all labs ordered are listed, but only abnormal results are displayed) Results for orders placed or performed during the hospital encounter of 07/20/18  CBC with Differential  Result Value Ref Range   WBC 9.7 4.0 - 10.5 K/uL   RBC 5.27 (H) 3.87 - 5.11 MIL/uL   Hemoglobin 14.2 12.0 - 15.0 g/dL  HCT 42.7 36.0 - 46.0 %   MCV 81.0 78.0 - 100.0 fL   MCH 26.9 26.0 - 34.0 pg   MCHC 33.3 30.0 - 36.0 g/dL   RDW 16.1 09.6 - 04.5 %   Platelets 224 150 - 400 K/uL   Neutrophils Relative % 62 %   Neutro Abs 6.0 1.7 - 7.7 K/uL   Lymphocytes  Relative 26 %   Lymphs Abs 2.5 0.7 - 4.0 K/uL   Monocytes Relative 8 %   Monocytes Absolute 0.8 0.1 - 1.0 K/uL   Eosinophils Relative 4 %   Eosinophils Absolute 0.4 0.0 - 0.7 K/uL   Basophils Relative 0 %   Basophils Absolute 0.0 0.0 - 0.1 K/uL  Comprehensive metabolic panel  Result Value Ref Range   Sodium 142 135 - 145 mmol/L   Potassium 3.3 (L) 3.5 - 5.1 mmol/L   Chloride 106 98 - 111 mmol/L   CO2 28 22 - 32 mmol/L   Glucose, Bld 105 (H) 70 - 99 mg/dL   BUN 10 8 - 23 mg/dL   Creatinine, Ser 4.09 0.44 - 1.00 mg/dL   Calcium 9.4 8.9 - 81.1 mg/dL   Total Protein 7.4 6.5 - 8.1 g/dL   Albumin 4.2 3.5 - 5.0 g/dL   AST 19 15 - 41 U/L   ALT 18 0 - 44 U/L   Alkaline Phosphatase 77 38 - 126 U/L   Total Bilirubin 1.0 0.3 - 1.2 mg/dL   GFR calc non Af Amer 58 (L) >60 mL/min   GFR calc Af Amer >60 >60 mL/min   Anion gap 8 5 - 15  Protime-INR  Result Value Ref Range   Prothrombin Time 13.1 11.4 - 15.2 seconds   INR 1.00   APTT  Result Value Ref Range   aPTT 27 24 - 36 seconds    Laboratory interpretation all normal except hypokalemia   EKG None  Radiology Dg Thoracic Spine W/swimmers  Result Date: 07/20/2018 CLINICAL DATA:  Fall with thoracolumbar back pain. EXAM: THORACIC SPINE - 3 VIEWS COMPARISON:  None. FINDINGS: Exaggerated upper thoracic kyphosis. Vertebral body heights are maintained. No evidence of acute fracture. Posterior elements appear intact. There is no paravertebral soft tissue abnormality. Implanted loop recorder projects over the left chest. IMPRESSION: No acute fracture of the thoracic spine. Exaggerated upper thoracic kyphosis. Electronically Signed   By: Narda Rutherford M.D.   On: 07/20/2018 05:35   Dg Lumbar Spine Complete  Result Date: 07/20/2018 CLINICAL DATA:  Post fall with thoracolumbar back pain. EXAM: LUMBAR SPINE - COMPLETE 4+ VIEW COMPARISON:  Radiographs 12/02/2012 FINDINGS: The alignment is maintained. Vertebral body heights are normal. There is  no listhesis. The posterior elements are intact. No fracture. Degenerative disc disease with disc space narrowing and endplate spurring at L2-L3 and L4-L5. Mild facet arthropathy in the lower lumbar spine. Sacroiliac joints are congruent. IMPRESSION: Degenerative change in the lumbar spine without acute fracture. Electronically Signed   By: Narda Rutherford M.D.   On: 07/20/2018 05:37   Ct Head Wo Contrast  Ct Cervical Spine Wo Contrast  Result Date: 07/20/2018 CLINICAL DATA:  Fall striking back of head. On blood thinners. Difficulty speaking. Patient reports having a seizure and falling outside in the grass after doctor's appointment today. EXAM: CT HEAD WITHOUT CONTRAST CT CERVICAL SPINE WITHOUT CONTRAST TECHNIQUE: Multidetector CT imaging of the head and cervical spine was performed following the standard protocol without intravenous contrast. Multiplanar CT image reconstructions of the cervical spine were also  generated. COMPARISON:  Multiple prior exams most recently 06/28/2018 FINDINGS: CT HEAD FINDINGS Brain: Acute subdural hematoma tracking along the anterior superior falx measuring approximately 11 mm. Minimal adjacent mass effect but no midline shift. No other hemorrhage. Unchanged atrophy and chronic small vessel ischemia. Remote right occipital and left parietal infarcts. No hydrocephalus. The basilar cisterns are patent. Vascular: Atherosclerosis of skullbase vasculature without hyperdense vessel or abnormal calcification. Skull: No fracture or focal lesion. Sinuses/Orbits: Paranasal sinuses and mastoid air cells are clear. The visualized orbits are unremarkable. Other: Small right parietal scalp hematoma. CT CERVICAL SPINE FINDINGS Alignment: Unchanged from prior exam with straightening of lordosis and broad-based rightward curvature. No traumatic subluxation. Skull base and vertebrae: Bony fusion of C3-C4 vertebral bodies and posterior elements presumed congenital. No acute fracture. The dens  and skull base are intact. Non fusion posterior arch of C2, normal variant. Soft tissues and spinal canal: No prevertebral fluid or swelling. No visible canal hematoma. Disc levels: Multilevel disc space narrowing and endplate spurring. Multilevel facet arthropathy. Degenerative changes are stable from prior exam. Upper chest: Lung apices are clear. Other: Carotid calcifications. IMPRESSION: 1. Acute subdural hematoma along the superior falx measuring 11 mm in thickness. Minimal mass effect, no midline shift. 2. Again seen atrophy, chronic small vessel ischemia, and remote infarcts. 3. No acute fracture or subluxation of the cervical spine. 4. Multilevel degenerative changes are stable from prior exam. Congenital C3-C4 fusion. Critical Value/emergent results were called by telephone at the time of interpretation on 07/20/2018 at 5:27 am to Dr. Devoria Albe , who verbally acknowledged these results. Electronically Signed   By: Narda Rutherford M.D.   On: 07/20/2018 05:29    Procedures .Critical Care Performed by: Devoria Albe, MD Authorized by: Devoria Albe, MD   Critical care provider statement:    Critical care time (minutes):  39   Critical care was necessary to treat or prevent imminent or life-threatening deterioration of the following conditions:  CNS failure or compromise   Critical care was time spent personally by me on the following activities:  Discussions with consultants, examination of patient, obtaining history from patient or surrogate, ordering and review of laboratory studies, ordering and review of radiographic studies, pulse oximetry and re-evaluation of patient's condition   (including critical care time)  Medications Ordered in ED Medications  desmopressin (DDAVP) 19.2 mcg in sodium chloride 0.9 % 50 mL IVPB (19.2 mcg Intravenous New Bag/Given 07/20/18 0647)  0.9 %  sodium chloride infusion (Manually program via Guardrails IV Fluids) (has no administration in time range)  levETIRAcetam  (KEPPRA) IVPB 1000 mg/100 mL premix (has no administration in time range)     Initial Impression / Assessment and Plan / ED Course  I have reviewed the triage vital signs and the nursing notes.  Pertinent labs & imaging results that were available during my care of the patient were reviewed by me and considered in my medical decision making (see chart for details).    CT of the head was ordered and cervical spine.  Of concern patient is on Plavix.  Also x-rays of the thoracic and lumbar spine were ordered.  When I reviewed patient's note there is a discussion in April with her son that she needed 24-hour care.  I am not sure whether her mental status now is a change or chronic.  When I talked to her I had the impression she might have some underlying dementia.  5:28 AM radiologist called patient's head CT results.  After reviewing  reversible for Plavix patient was given DDAVP 0.3 mcg/kg per dose over 30 minutes and started on platelet transfusion.  I have put a consult into neurosurgery.  When I review the chart patient was given a diagnosis on August 21 of seizures.  However patient told me she has not gotten her prescription filled.  I am going to give her just 500 of Keppra IV in case she actually is already on it.  5:59 AM I discussed patient with [facility], neurosurgical PA.  He agrees with starting patient on her Keppra IV, and the DDAVP and platelet transfusion.  He feels like she can be admitted to observation and stay here under the hospitalist service.  He states his physician is Dr. Conchita Paris.  06:15 AM Dr Robb Matar, states they don't usually admit intracranial bleeding to the medicine service  6:33 AM patient was discussed with Dr. Otelia Limes, neurologist he has read the neurosurgery note.  He agrees with the assessment however he states they have an agreement that if there is a intracranial bleed from trauma that the patient is admitted to neurosurgery.  [facility] is going to get me back in  contact with the neurosurgery staff.  After reading neurosurgery's note her IV Keppra was increased to 1000 mg IV.  6:45 AM I talked to the patient about what was going on and her need for transfer.  She seems to understand.  Sometimes what she states is very clear than other times is not.  She tells me she does remember falling yesterday.  When I talked to her about admission and she would need to go to [facility] she asked if she was being transferred which is appropriate.  07:12 AM Merry Lofty, PA neurosurgery, states she will contact Vinny again to have him admit the patient.  She states is okay for me to put in a bed request for stepdown under Dr. Conchita Paris.  Bed request was done.  07:22 AM Vinny, neurosurgery PA, does not want to admit patient, states she needs to evaluated for her syncopy/seizures, states he will have his attending call me back.   08:03 AM Dr Conchita Paris, neurosurgery, states he will admit, but feels the underlying condition needs to be evaluated by someone else.   Final Clinical Impressions(s) / ED Diagnoses   Final diagnoses:  SDH (subdural hematoma) (HCC)    Plan transfer to [facility] for admission  Devoria Albe, MD, Concha Pyo, MD 07/20/18 [phone]

## 2018-07-20 NOTE — Consult Note (Signed)
Patient Demographics:    Shannon [name], is a 67 y.o. female  MRN: [id number]   DOB - 1951/05/02  Admit Date - 07/20/2018  Outpatient Primary MD for the patient is Nila Nephew, MD   Assessment & Plan:    Principal Problem:   Subdural hematoma [facility]) Active Problems:   CAD S/P LAD PCI July 2011   Hypertension, essential   Acute CVA -Dec 2015   DM type 2 (diabetes mellitus, type 2) (HCC)   Cerebral thrombosis with cerebral infarction [facility])   Hypothyroidism  07/20/18 CT Head IMPRESSION: 1. Acute subdural hematoma along the superior falx measuring 11 mm in thickness. Minimal mass effect, no midline shift. 2. Again seen atrophy, chronic small vessel ischemia, and remote infarcts. 3. No acute fracture or subluxation of the cervical spine. 4. Multilevel degenerative changes are stable from prior exam. Congenital C3-C4 fusion.   Plan:-  1)Acute Subdural Hematoma--status post mechanical fall with closed head injury and acute subdural hematoma-----upon further review and obtaining history from patient and patient's caregiver Ms Edwinna Areola who was present during the fall,  patient had no syncope, no seizures , this was a mechanical fall. Prior to admission patient was on aspirin and Plavix due to prior stroke and CAD, ED provider gave patient DDAVP as well as platelet transfusion.  Neurosurgical input from Dr> [facility] requested by EDP.  And is to transfer patient to [facility] stepdown unit.  EDP discussed case with on-call neurologist Dr. Otelia Limes who advised admission to neurosurgical service. Case d/w neurosurgical PA Danny.   In ED--- CT head showed subdural hematoma, EDP discussed case with on-call neurologist Dr. 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  History was verified with patient in ED Room # 4 with RN Tsosie Billing at bedside.  Patient appeared coherent and consistent with her story, and the history/story was verified by her friend Ms. Edwinna Areola at who was the one pushing the Rollator when patient fell out of the Rollator from a seated position after the Rolator wheel hit a hole/dish in the grass in front of patient's house.  Patient's son came to help patient get into the house, there was no incontinence, no tongue biting, no seizure type activity, no postictal type confusion or lethargy, patient does complain of headache, and patient also had tremors which are not new for her but no frank seizures  3)DM2- controlled, last A1c 6.2, hold Amaryl and Januvia due to unreliable oral intake at this time, Allow some permissive Hyperglycemia  rather than risk life-threatening hypoglycemia in a patient with unreliable oral intake. Use modified sliding scale with Novolog/Humalog Sliding scale insulin with Accu-Cheks/Fingersticks as ordered  4)Hypothyroidism- stable, TSH 4.4  5)CAD--- h/o Prior PCI, no ACS type symptoms at this time, no frank syncope at this time, continue Lipitor, metoprolol 25 mg twice daily, and isosorbide 30 mg daily, hold aspirin and Plavix due to intracranial hematoma  6)HTN- given ICH we need good BP control, restart isosorbide 30 mg daily, amlodipine 10 mg daily, and metoprolol 25 mg twice daily  7)H/o Prior CVA-mild residual right-sided deficits, no frank syncope at this time, hold aspirin and Plavix as above in #5, continue Lipitor  8)Tremors---  ???  Significance, await neurology input, recently saw PCP who wanted to initiate Keppra.   9)Depression/Anxiety--- stable, continue Lexapro 10 mg daily and use hydroxyzine as needed anxiety and insomnia  Thank you Dr Conchita Paris for this Hospitalist Medicine consult   With History of - Reviewed by me  Past Medical History:  Diagnosis Date  . Anxiety   . Arthritis   . Coronary artery disease    a. s/p PCI with DES to LAD in 2011  (LHC 03/2010: Mid LAD 70%, normal ejection fraction  >> PCI:  2.75 x 15 mm Promus DES to LAD); b. Nuclear Stress Test 05/29/14: No ischemia, EF 59%, Normal; c. NSTEMI 11/2014 >> patient declined LHC b/c of recent CVA  . Depression   . Diabetes mellitus without complication (HCC)   . Dyslipidemia 11/20/2014  . Fibromyalgia 1995  . H/O ischemic right PCA stroke 10/2014   a. Carotid US 10/24/14: No significant ICA stenosis bilaterally  . Hemiparesis and alteration of sensations as late effects of stroke (HCC) 06/05/2015  . Hx of echocardiogram    a. Echocardiogram 10/25/14: Moderate LVH, EF 50-55%, normal wall motion, grade 2 diastolic dysfunction, mild LAE, atrial septal lipomatous hypertrophy  . Hypertension   . Irritable bowel       Past Surgical History:  Procedure Laterality Date  . CHOLECYSTECTOMY    . CORONARY STENT PLACEMENT    . LOOP RECORDER INSERTION N/A 06/23/2017   Procedure: Loop Recorder Insertion;  Surgeon: Duke Salvia, MD;  Location: [facility] INVASIVE CV LAB;  Service: Cardiovascular;  Laterality: N/A;  . orif arm fracture Right    forearm  . TEE WITHOUT CARDIOVERSION N/A 04/12/2017   Procedure: TRANSESOPHAGEAL ECHOCARDIOGRAM (TEE) WITH PROPOFOL;  Surgeon: Jonelle Sidle, MD;  Location: [facility] ENDO SUITE;  Service: Cardiovascular;  Laterality: N/A;    Chief Complaint  Patient presents with  . Seizures      HPI:    Shannon [name]  is a 67 y.o. female with a history relevant for prior CVA in 2015 with some right-sided residual deficits, CAD with  previous LAD PCI in 2011, hypothyroidism, tremors, diabetes and hypertension who presented to the ED with complaints of headache after falling off a Rollator walker from a seated position on 07/19/2018 just before 5 PM.   Patient apparently had been to her PCP for follow-up after her recent hospitalization, while at the PCPs office she complained of tremors, she was given Keppra to help with her tremors, patient returned home from PCPs office and unfortunately fell from her rollator walker  that was being pushed by her friend Ms. Edwinna Areola from a seated position outside her house on the grass striking the back of her head on the grass. No LOC, no incontinence no tongue biting no seizure type activity no postictal confusion  or lethargy. She had Tremors which is not new for her  Upon further review and obtaining history from patient and patient's caregiver Ms Edwinna Areola who was present during the fall,  patient had no syncope, no seizures , this was a mechanical fall.  History was verified with patient in ED Room # 4 with RN Tsosie Billing at bedside.  Patient appeared coherent and consistent with her story, and the history/story was verified by her friend Ms. Edwinna Areola at who was the one pushing the Rollator when patient fell out of the Rollator from a seated position after the Rolator wheel hit a hole/dish in the grass in front of patient's house.  Patient's son came to help patient get into the house, there was no incontinence, no tongue biting, no seizure type activity, no postictal type confusion or lethargy, patient does complain of headache, and patient also had tremors which are not new for her but no frank seizures  In ED--- CT head showed subdural hematoma, EDP discussed case with on-call neurologist Dr. Otelia Limes  who advised admission to neurosurgical service for traumatic subdural hematoma  EDP gave DDVAP and transfuse platelets and gave iv Keppra  PTT is 27, INR is 1.0, Hgb is 14.2 ,  Platelets 224, Creatinine 0.97    Review of systems:    In addition to the HPI above,   A full Review of  Systems was done, all other systems reviewed are negative except as noted above in HPI , .    Social History:  Reviewed by me    Social History   Tobacco Use  . Smoking status: Never Smoker  . Smokeless tobacco: Never Used  Substance Use Topics  . Alcohol use: No    Alcohol/week: 0.0 standard drinks     Family History :  Reviewed by me    Family History  Problem Relation Age of Onset  . Cancer Mother   . Diabetes Mother   . Hypertension Mother   . Heart failure Mother   . CAD Mother   . Heart attack Mother   . Cancer Father   . Diabetes Father   . Hypertension Father   . Heart failure Father   . CAD Father   . Heart attack Father   . Stroke Father   . CAD Brother   . Diabetes Brother   . Hypertension Brother   . CAD Sister   . Diabetes Sister   . Hypertension Sister   . Heart attack Sister   . Heart attack Brother   . Stroke Maternal Aunt       Home Medications:   Prior to Admission medications   Medication Sig Start Date End Date Taking? Authorizing Provider  acetaminophen (TYLENOL) 500 MG tablet Take 500 mg by mouth daily as needed for mild pain.   Yes [provider]  amLODipine (NORVASC) 10 MG tablet Take 10 mg by mouth daily. 09/11/16  Yes [provider]  aspirin EC 81 MG tablet Take 1 tablet (81 mg total) by mouth daily. 11/27/14  Yes Weaver, Scott T, PA-C  atorvastatin (LIPITOR) 40 MG tablet Take 1 tablet (40 mg total) by mouth daily at 6 PM. 12/30/17  Yes Memon, Durward Mallard, MD  clopidogrel (PLAVIX) 75 MG tablet TAKE ONE TABLET ONCE DAILY WITH BREAKFAST Patient taking differently: Take 75 mg by mouth daily.  08/13/17  Yes Jake Bathe, MD  escitalopram (LEXAPRO) 10 MG tablet Take 1 tablet by mouth daily. 12/01/17  Yes [provider]  gabapentin (NEURONTIN) 400 MG capsule Take 400 mg by mouth 2 (two) times daily.  07/29/16  Yes [provider]  glimepiride (AMARYL) 2 MG tablet Take 2 mg by mouth daily with breakfast.   Yes [provider]  hydrOXYzine (ATARAX/VISTARIL) 25 MG tablet Take 25-50 mg by mouth See admin instructions. 25mg  in the morning, noon and at bedtime. Take 50mg  in the evening only 06/10/18  Yes [provider]  isosorbide mononitrate (IMDUR) 30 MG 24 hr tablet Take 1 tablet (30 mg total) by mouth daily. DUE APPT IN AUGUST, MAKE APPT FOR FURTHER REFILLS, CALL [phone] TO SCHEDULE Patient taking differently: Take 30 mg by mouth daily.  06/12/17  Yes Houston Siren, MD  JANUVIA 100 MG tablet Take 100 mg by mouth daily. 09/11/16  Yes [provider]  metoprolol tartrate (LOPRESSOR) 25 MG tablet Take 1-2 tablets by mouth See admin instructions. 1 tablet in am and 2 tablets in evening 07/06/18  Yes [provider]  nitroGLYCERIN (NITROSTAT) 0.4 MG SL tablet Place 1 tablet (0.4 mg total) under the tongue every 5 (five) minutes as needed. 06/12/17  Yes Houston Siren, MD  nystatin cream (MYCOSTATIN) Apply 1 application topically 2 (two) times daily.   Yes [provider]  temazepam (RESTORIL) 30 MG capsule Take 1 capsule (30 mg total) by mouth at bedtime. 01/05/18  Yes Lassen, Arlo C, PA-C  isosorbide dinitrate (ISORDIL) 30 MG tablet Take 1 tablet (30 mg total) by mouth every morning. 03/14/14 07/13/14  Ambrose Finland, NP     Allergies:     Allergies  Allergen Reactions  . Insulins Other (See Comments)    Causes severe diarrhea  . Sulfa Antibiotics Other (See Comments)    Childhood allergy, can't remember  . Lisinopril Cough     Physical Exam:   Vitals  Blood pressure 136/78, pulse 84, temperature 98.1 F (36.7 C), resp. rate 16, height 5\' 3"  (1.6 m), weight 63.5 kg, SpO2 97 %.  Physical Examination: General appearance - alert, well appearing, and in no distress  Mental status - alert, oriented to person, place, and time,  Eyes - sclera  anicteric Face/Mouth-subtle right-sided facial droop/facial asymmetry, Neck - supple, no JVD elevation , Chest - clear  to auscultation bilaterally, symmetrical air movement,  Heart - S1 and S2 normal, regular Abdomen - soft, nontender, nondistended, no masses or organomegaly Neurological - screening mental status exam normal, neck supple without rigidity, subtle right-sided hemiparesis, patient is right-hand dominant,subtle right-sided facial droop/facial asymmetry, Extremities - no pedal edema noted, intact peripheral pulses  Skin - warm, dry     Data Review:    CBC Recent Labs  Lab 07/20/18 0603  WBC 9.7  HGB 14.2  HCT 42.7  PLT 224  MCV 81.0  MCH 26.9  MCHC 33.3  RDW 13.8  LYMPHSABS 2.5  MONOABS 0.8  EOSABS 0.4  BASOSABS 0.0   ------------------------------------------------------------------------------------------------------------------  Chemistries  Recent Labs  Lab 07/20/18 0603  NA 142  K 3.3*  CL 106  CO2 28  GLUCOSE 105*  BUN 10  CREATININE 0.97  CALCIUM 9.4  AST 19  ALT 18  ALKPHOS 77  BILITOT 1.0   ------------------------------------------------------------------------------------------------------------------ estimated creatinine clearance is 49.1 mL/min (by C-G formula based on SCr of 0.97 mg/dL). ------------------------------------------------------------------------------------------------------------------ No results for input(s): TSH, T4TOTAL, T3FREE, THYROIDAB in the last 72 hours.  Invalid input(s): FREET3   Coagulation profile Recent Labs  Lab 07/20/18 0603  INR 1.00   ------------------------------------------------------------------------------------------------------------------- No results  for input(s): DDIMER in the last 72 hours. -------------------------------------------------------------------------------------------------------------------  Cardiac Enzymes No results for input(s): CKMB, TROPONINI, MYOGLOBIN in the  last 168 hours.  Invalid input(s): CK ------------------------------------------------------------------------------------------------------------------ No results found for: BNP   ---------------------------------------------------------------------------------------------------------------  Urinalysis    Component Value Date/Time   COLORURINE STRAW (A) 06/28/2018 2045   APPEARANCEUR CLEAR 06/28/2018 2045   LABSPEC 1.003 (L) 06/28/2018 2045   PHURINE 5.0 06/28/2018 2045   GLUCOSEU NEGATIVE 06/28/2018 2045   HGBUR NEGATIVE 06/28/2018 2045   BILIRUBINUR NEGATIVE 06/28/2018 2045   BILIRUBINUR neg 04/13/2014 1733   KETONESUR NEGATIVE 06/28/2018 2045   PROTEINUR NEGATIVE 06/28/2018 2045   UROBILINOGEN 0.2 04/13/2014 1733   NITRITE NEGATIVE 06/28/2018 2045   LEUKOCYTESUR NEGATIVE 06/28/2018 2045    ----------------------------------------------------------------------------------------------------------------   Imaging Results:    Dg Thoracic Spine W/swimmers  Result Date: 07/20/2018 CLINICAL DATA:  Fall with thoracolumbar back pain. EXAM: THORACIC SPINE - 3 VIEWS COMPARISON:  None. FINDINGS: Exaggerated upper thoracic kyphosis. Vertebral body heights are maintained. No evidence of acute fracture. Posterior elements appear intact. There is no paravertebral soft tissue abnormality. Implanted loop recorder projects over the left chest. IMPRESSION: No acute fracture of the thoracic spine. Exaggerated upper thoracic kyphosis. Electronically Signed   By: Narda Rutherford M.D.   On: 07/20/2018 05:35   Dg Lumbar Spine Complete  Result Date: 07/20/2018 CLINICAL DATA:  Post fall with thoracolumbar back pain. EXAM: LUMBAR SPINE - COMPLETE 4+ VIEW COMPARISON:  Radiographs 12/02/2012 FINDINGS: The alignment is maintained. Vertebral body heights are normal. There is no listhesis. The posterior elements are intact. No fracture. Degenerative disc disease with disc space narrowing and endplate  spurring at L2-L3 and L4-L5. Mild facet arthropathy in the lower lumbar spine. Sacroiliac joints are congruent. IMPRESSION: Degenerative change in the lumbar spine without acute fracture. Electronically Signed   By: Narda Rutherford M.D.   On: 07/20/2018 05:37   Ct Head Wo Contrast  Result Date: 07/20/2018 CLINICAL DATA:  Fall striking back of head. On blood thinners. Difficulty speaking. Patient reports having a seizure and falling outside in the grass after doctor's appointment today. EXAM: CT HEAD WITHOUT CONTRAST CT CERVICAL SPINE WITHOUT CONTRAST TECHNIQUE: Multidetector CT imaging of the head and cervical spine was performed following the standard protocol without intravenous contrast. Multiplanar CT image reconstructions of the cervical spine were also generated. COMPARISON:  Multiple prior exams most recently 06/28/2018 FINDINGS: CT HEAD FINDINGS Brain: Acute subdural hematoma tracking along the anterior superior falx measuring approximately 11 mm. Minimal adjacent mass effect but no midline shift. No other hemorrhage. Unchanged atrophy and chronic small vessel ischemia. Remote right occipital and left parietal infarcts. No hydrocephalus. The basilar cisterns are patent. Vascular: Atherosclerosis of skullbase vasculature without hyperdense vessel or abnormal calcification. Skull: No fracture or focal lesion. Sinuses/Orbits: Paranasal sinuses and mastoid air cells are clear. The visualized orbits are unremarkable. Other: Small right parietal scalp hematoma. CT CERVICAL SPINE FINDINGS Alignment: Unchanged from prior exam with straightening of lordosis and broad-based rightward curvature. No traumatic subluxation. Skull base and vertebrae: Bony fusion of C3-C4 vertebral bodies and posterior elements presumed congenital. No acute fracture. The dens and skull base are intact. Non fusion posterior arch of C2, normal variant. Soft tissues and spinal canal: No prevertebral fluid or swelling. No visible canal  hematoma. Disc levels: Multilevel disc space narrowing and endplate spurring. Multilevel facet arthropathy. Degenerative changes are stable from prior exam. Upper chest: Lung apices are clear. Other: Carotid calcifications. IMPRESSION: 1. Acute subdural hematoma along the  superior falx measuring 11 mm in thickness. Minimal mass effect, no midline shift. 2. Again seen atrophy, chronic small vessel ischemia, and remote infarcts. 3. No acute fracture or subluxation of the cervical spine. 4. Multilevel degenerative changes are stable from prior exam. Congenital C3-C4 fusion. Critical Value/emergent results were called by telephone at the time of interpretation on 07/20/2018 at 5:27 am to Dr. Devoria Albe , who verbally acknowledged these results. Electronically Signed   By: Narda Rutherford M.D.   On: 07/20/2018 05:29   Ct Cervical Spine Wo Contrast  Result Date: 07/20/2018 CLINICAL DATA:  Fall striking back of head. On blood thinners. Difficulty speaking. Patient reports having a seizure and falling outside in the grass after doctor's appointment today. EXAM: CT HEAD WITHOUT CONTRAST CT CERVICAL SPINE WITHOUT CONTRAST TECHNIQUE: Multidetector CT imaging of the head and cervical spine was performed following the standard protocol without intravenous contrast. Multiplanar CT image reconstructions of the cervical spine were also generated. COMPARISON:  Multiple prior exams most recently 06/28/2018 FINDINGS: CT HEAD FINDINGS Brain: Acute subdural hematoma tracking along the anterior superior falx measuring approximately 11 mm. Minimal adjacent mass effect but no midline shift. No other hemorrhage. Unchanged atrophy and chronic small vessel ischemia. Remote right occipital and left parietal infarcts. No hydrocephalus. The basilar cisterns are patent. Vascular: Atherosclerosis of skullbase vasculature without hyperdense vessel or abnormal calcification. Skull: No fracture or focal lesion. Sinuses/Orbits: Paranasal sinuses  and mastoid air cells are clear. The visualized orbits are unremarkable. Other: Small right parietal scalp hematoma. CT CERVICAL SPINE FINDINGS Alignment: Unchanged from prior exam with straightening of lordosis and broad-based rightward curvature. No traumatic subluxation. Skull base and vertebrae: Bony fusion of C3-C4 vertebral bodies and posterior elements presumed congenital. No acute fracture. The dens and skull base are intact. Non fusion posterior arch of C2, normal variant. Soft tissues and spinal canal: No prevertebral fluid or swelling. No visible canal hematoma. Disc levels: Multilevel disc space narrowing and endplate spurring. Multilevel facet arthropathy. Degenerative changes are stable from prior exam. Upper chest: Lung apices are clear. Other: Carotid calcifications. IMPRESSION: 1. Acute subdural hematoma along the superior falx measuring 11 mm in thickness. Minimal mass effect, no midline shift. 2. Again seen atrophy, chronic small vessel ischemia, and remote infarcts. 3. No acute fracture or subluxation of the cervical spine. 4. Multilevel degenerative changes are stable from prior exam. Congenital C3-C4 fusion. Critical Value/emergent results were called by telephone at the time of interpretation on 07/20/2018 at 5:27 am to Dr. Devoria Albe , who verbally acknowledged these results. Electronically Signed   By: Narda Rutherford M.D.   On: 07/20/2018 05:29   Dg Femur, Min 2 Views Right  Result Date: 07/20/2018 CLINICAL DATA:  Posterior thigh mass EXAM: RIGHT FEMUR 2 VIEWS COMPARISON:  None. FINDINGS: Bb marks an area of palpable concern in the mid posterior thigh. No soft tissue mass. No abnormal calcifications in the soft tissues. Negative for fracture or skeletal mass lesion. Degenerative change in the knee with chondrocalcinosis. Right hip intact. IMPRESSION: Negative for mass lesion.  No acute bony abnormality. Electronically Signed   By: Marlan Palau M.D.   On: 07/20/2018 08:25     Radiological Exams on Admission: Dg Thoracic Spine W/swimmers  Result Date: 07/20/2018 CLINICAL DATA:  Fall with thoracolumbar back pain. EXAM: THORACIC SPINE - 3 VIEWS COMPARISON:  None. FINDINGS: Exaggerated upper thoracic kyphosis. Vertebral body heights are maintained. No evidence of acute fracture. Posterior elements appear intact. There is no paravertebral soft tissue  abnormality. Implanted loop recorder projects over the left chest. IMPRESSION: No acute fracture of the thoracic spine. Exaggerated upper thoracic kyphosis. Electronically Signed   By: Narda Rutherford M.D.   On: 07/20/2018 05:35   Dg Lumbar Spine Complete  Result Date: 07/20/2018 CLINICAL DATA:  Post fall with thoracolumbar back pain. EXAM: LUMBAR SPINE - COMPLETE 4+ VIEW COMPARISON:  Radiographs 12/02/2012 FINDINGS: The alignment is maintained. Vertebral body heights are normal. There is no listhesis. The posterior elements are intact. No fracture. Degenerative disc disease with disc space narrowing and endplate spurring at L2-L3 and L4-L5. Mild facet arthropathy in the lower lumbar spine. Sacroiliac joints are congruent. IMPRESSION: Degenerative change in the lumbar spine without acute fracture. Electronically Signed   By: Narda Rutherford M.D.   On: 07/20/2018 05:37   Ct Head Wo Contrast  Result Date: 07/20/2018 CLINICAL DATA:  Fall striking back of head. On blood thinners. Difficulty speaking. Patient reports having a seizure and falling outside in the grass after doctor's appointment today. EXAM: CT HEAD WITHOUT CONTRAST CT CERVICAL SPINE WITHOUT CONTRAST TECHNIQUE: Multidetector CT imaging of the head and cervical spine was performed following the standard protocol without intravenous contrast. Multiplanar CT image reconstructions of the cervical spine were also generated. COMPARISON:  Multiple prior exams most recently 06/28/2018 FINDINGS: CT HEAD FINDINGS Brain: Acute subdural hematoma tracking along the anterior  superior falx measuring approximately 11 mm. Minimal adjacent mass effect but no midline shift. No other hemorrhage. Unchanged atrophy and chronic small vessel ischemia. Remote right occipital and left parietal infarcts. No hydrocephalus. The basilar cisterns are patent. Vascular: Atherosclerosis of skullbase vasculature without hyperdense vessel or abnormal calcification. Skull: No fracture or focal lesion. Sinuses/Orbits: Paranasal sinuses and mastoid air cells are clear. The visualized orbits are unremarkable. Other: Small right parietal scalp hematoma. CT CERVICAL SPINE FINDINGS Alignment: Unchanged from prior exam with straightening of lordosis and broad-based rightward curvature. No traumatic subluxation. Skull base and vertebrae: Bony fusion of C3-C4 vertebral bodies and posterior elements presumed congenital. No acute fracture. The dens and skull base are intact. Non fusion posterior arch of C2, normal variant. Soft tissues and spinal canal: No prevertebral fluid or swelling. No visible canal hematoma. Disc levels: Multilevel disc space narrowing and endplate spurring. Multilevel facet arthropathy. Degenerative changes are stable from prior exam. Upper chest: Lung apices are clear. Other: Carotid calcifications. IMPRESSION: 1. Acute subdural hematoma along the superior falx measuring 11 mm in thickness. Minimal mass effect, no midline shift. 2. Again seen atrophy, chronic small vessel ischemia, and remote infarcts. 3. No acute fracture or subluxation of the cervical spine. 4. Multilevel degenerative changes are stable from prior exam. Congenital C3-C4 fusion. Critical Value/emergent results were called by telephone at the time of interpretation on 07/20/2018 at 5:27 am to Dr. Devoria Albe , who verbally acknowledged these results. Electronically Signed   By: Narda Rutherford M.D.   On: 07/20/2018 05:29   Ct Cervical Spine Wo Contrast  Result Date: 07/20/2018 CLINICAL DATA:  Fall striking back of head. On  blood thinners. Difficulty speaking. Patient reports having a seizure and falling outside in the grass after doctor's appointment today. EXAM: CT HEAD WITHOUT CONTRAST CT CERVICAL SPINE WITHOUT CONTRAST TECHNIQUE: Multidetector CT imaging of the head and cervical spine was performed following the standard protocol without intravenous contrast. Multiplanar CT image reconstructions of the cervical spine were also generated. COMPARISON:  Multiple prior exams most recently 06/28/2018 FINDINGS: CT HEAD FINDINGS Brain: Acute subdural hematoma tracking along the anterior superior  falx measuring approximately 11 mm. Minimal adjacent mass effect but no midline shift. No other hemorrhage. Unchanged atrophy and chronic small vessel ischemia. Remote right occipital and left parietal infarcts. No hydrocephalus. The basilar cisterns are patent. Vascular: Atherosclerosis of skullbase vasculature without hyperdense vessel or abnormal calcification. Skull: No fracture or focal lesion. Sinuses/Orbits: Paranasal sinuses and mastoid air cells are clear. The visualized orbits are unremarkable. Other: Small right parietal scalp hematoma. CT CERVICAL SPINE FINDINGS Alignment: Unchanged from prior exam with straightening of lordosis and broad-based rightward curvature. No traumatic subluxation. Skull base and vertebrae: Bony fusion of C3-C4 vertebral bodies and posterior elements presumed congenital. No acute fracture. The dens and skull base are intact. Non fusion posterior arch of C2, normal variant. Soft tissues and spinal canal: No prevertebral fluid or swelling. No visible canal hematoma. Disc levels: Multilevel disc space narrowing and endplate spurring. Multilevel facet arthropathy. Degenerative changes are stable from prior exam. Upper chest: Lung apices are clear. Other: Carotid calcifications. IMPRESSION: 1. Acute subdural hematoma along the superior falx measuring 11 mm in thickness. Minimal mass effect, no midline shift. 2.  Again seen atrophy, chronic small vessel ischemia, and remote infarcts. 3. No acute fracture or subluxation of the cervical spine. 4. Multilevel degenerative changes are stable from prior exam. Congenital C3-C4 fusion. Critical Value/emergent results were called by telephone at the time of interpretation on 07/20/2018 at 5:27 am to Dr. Devoria Albe , who verbally acknowledged these results. Electronically Signed   By: Narda Rutherford M.D.   On: 07/20/2018 05:29   Dg Femur, Min 2 Views Right  Result Date: 07/20/2018 CLINICAL DATA:  Posterior thigh mass EXAM: RIGHT FEMUR 2 VIEWS COMPARISON:  None. FINDINGS: Bb marks an area of palpable concern in the mid posterior thigh. No soft tissue mass. No abnormal calcifications in the soft tissues. Negative for fracture or skeletal mass lesion. Degenerative change in the knee with chondrocalcinosis. Right hip intact. IMPRESSION: Negative for mass lesion.  No acute bony abnormality. Electronically Signed   By: Marlan Palau M.D.   On: 07/20/2018 08:25    DVT Prophylaxis -SCD  (No heparin due to ICH) AM Labs Ordered, also please review Full Orders  Family Communication: Admission, patients condition and plan of care including tests being ordered have been discussed with the patient and friend Ms Edwinna Areola who indicate understanding and agree with the plan   Code Status - Full Code  Likely DC to  Home with [facility] SNF rehab  Condition   Stable  Shon Hale M.D on 07/20/2018 at 2:30 PM Pager---[phone] Go to [email or web address] - [email or web address] for contact info  [facility] - Office  [phone]

## 2018-07-21 LAB — BPAM PLATELET PHERESIS
BLOOD PRODUCT EXPIRATION DATE: 201909122359
ISSUE DATE / TIME: 201909110756
Unit Type and Rh: 6200

## 2018-07-21 LAB — PREPARE PLATELET PHERESIS: Unit division: 0

## 2019-04-18 IMAGING — DX DG THORACIC SPINE 3V
3 series · 3 of 3 positions shown · non-contrast
Comparison: None.

CLINICAL DATA: Fall with thoracolumbar back pain.

EXAM:
THORACIC SPINE - 3 VIEWS

[t-spine ap]
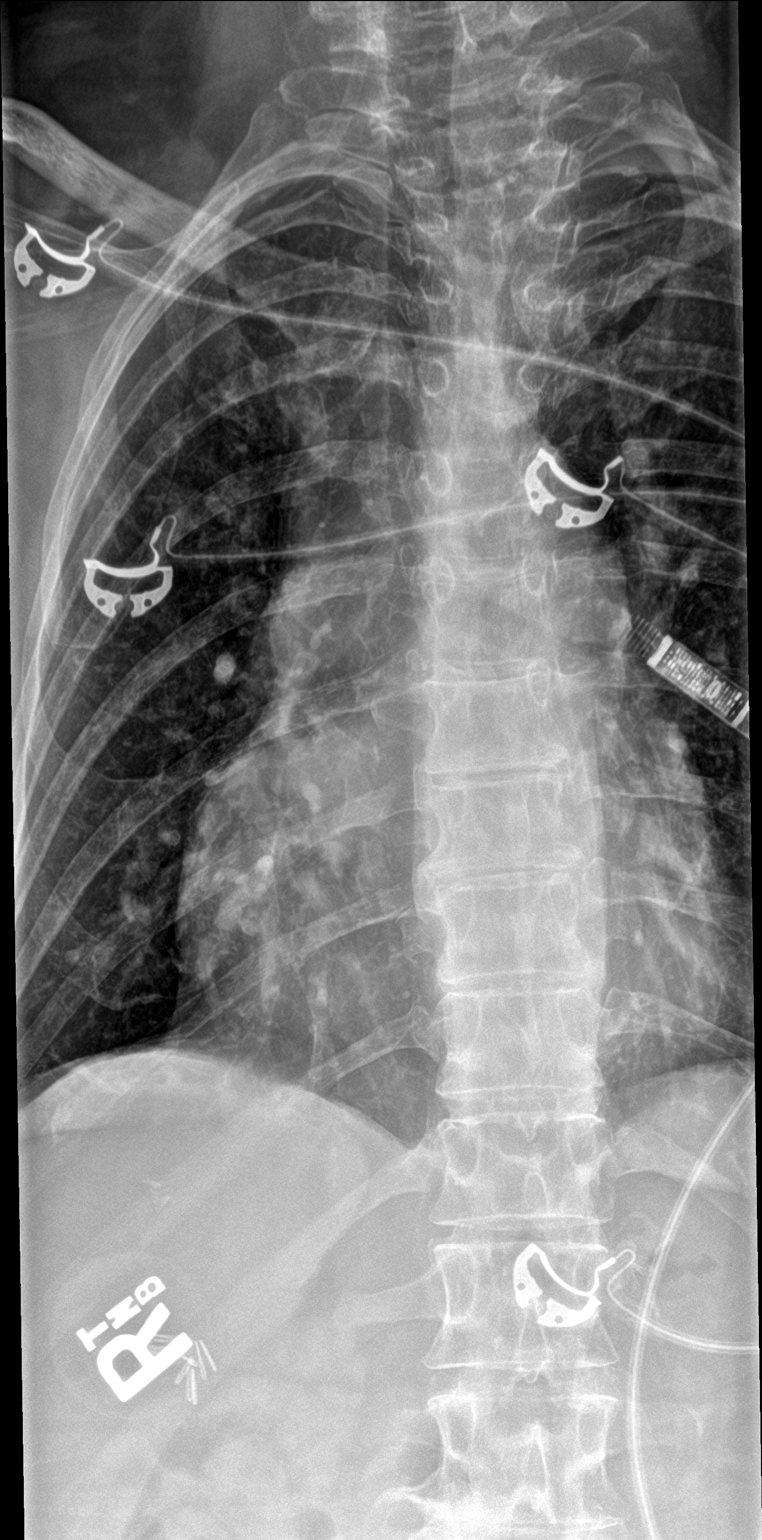

[t-spine lat]
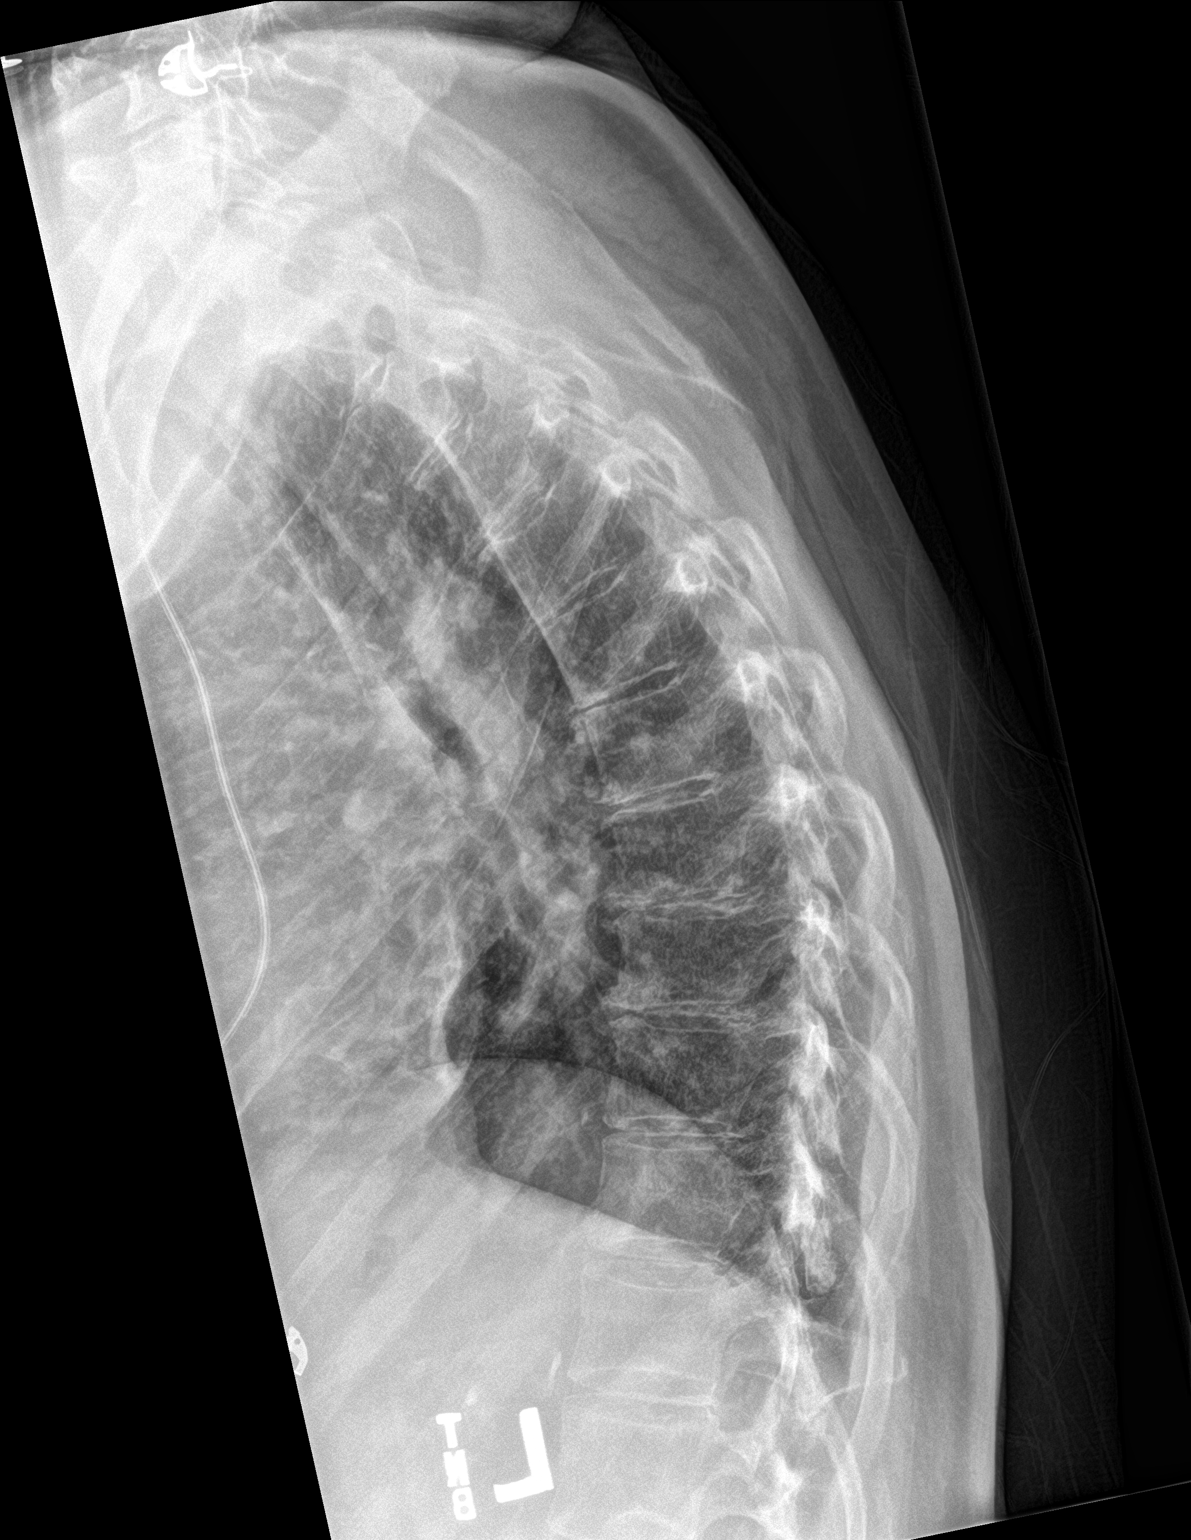

[c-spine swimmers trauma]
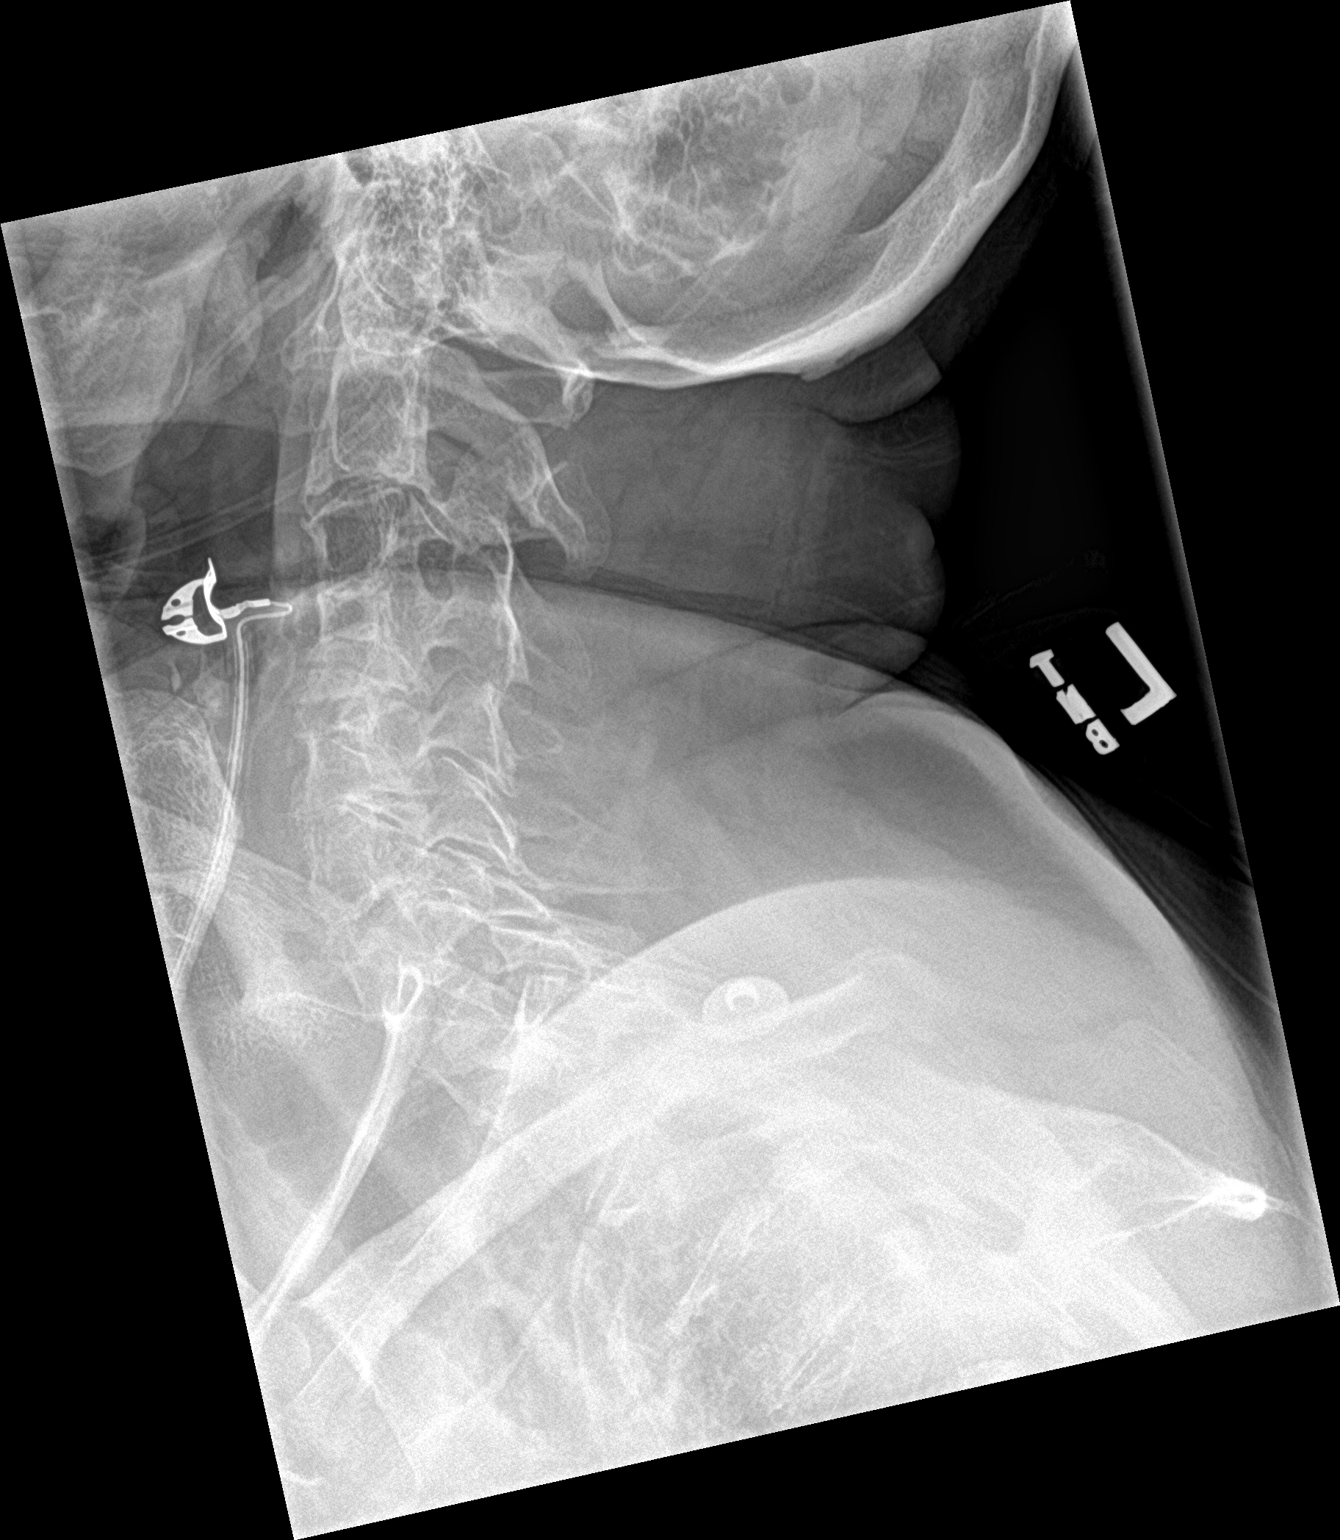

[3 of 3 positions shown; findings below may reference images not displayed]

FINDINGS: Exaggerated upper thoracic kyphosis. Vertebral body heights are
maintained. No evidence of acute fracture. Posterior elements appear
intact. There is no paravertebral soft tissue abnormality. Implanted
loop recorder projects over the left chest.
IMPRESSION: No acute fracture of the thoracic spine. Exaggerated upper thoracic
kyphosis.

## 2019-04-18 IMAGING — CT CT HEAD W/O CM
5 of 8 series · 16 of 47 positions shown, 17 images · non-contrast
Comparison: Multiple prior exams most recently 06/28/2018

CLINICAL DATA: Fall striking back of head. On blood thinners.
Difficulty speaking.

Patient reports having a seizure and falling outside in the grass
after doctor's appointment today.
EXAM:
CT HEAD WITHOUT CONTRAST
CT CERVICAL SPINE WITHOUT CONTRAST
TECHNIQUE: Multidetector CT imaging of the head and cervical spine was
performed following the standard protocol without intravenous
contrast. Multiplanar CT image reconstructions of the cervical spine
were also generated.

[Series 3: head wo · axial · 0.40mm/px · z∈[+243,+293]mm · 2 of 30 slices shown, 3 images]
[im 10/30  brain]
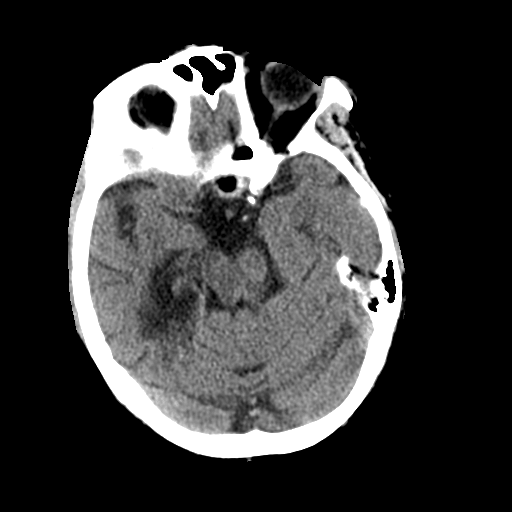
[im 10/30  bone]
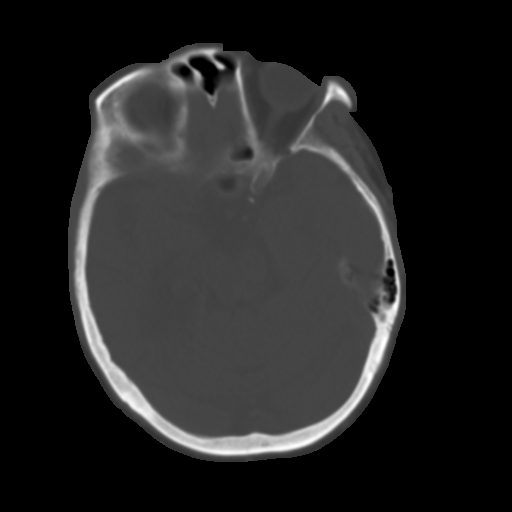
[im 20/30  brain]
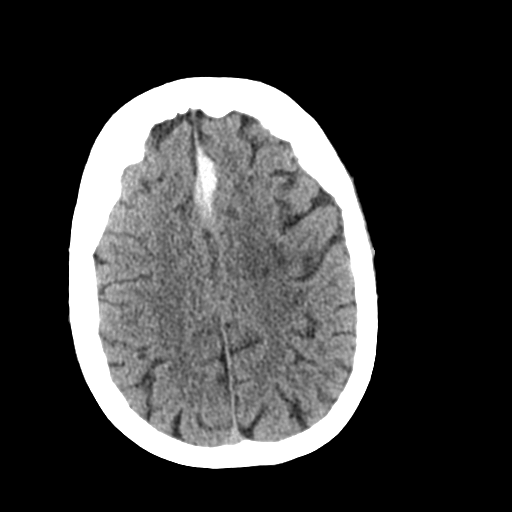

[Series 4: head bone · axial · 0.40mm/px · z∈[+218,+260]mm · 3 of 75 slices shown]
[im 11/75  bone]
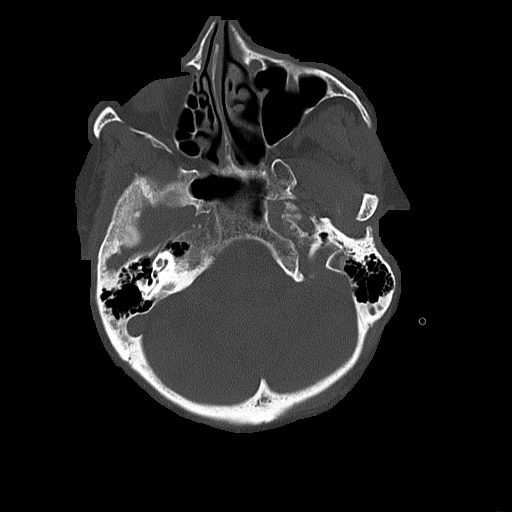
[im 22/75  bone]
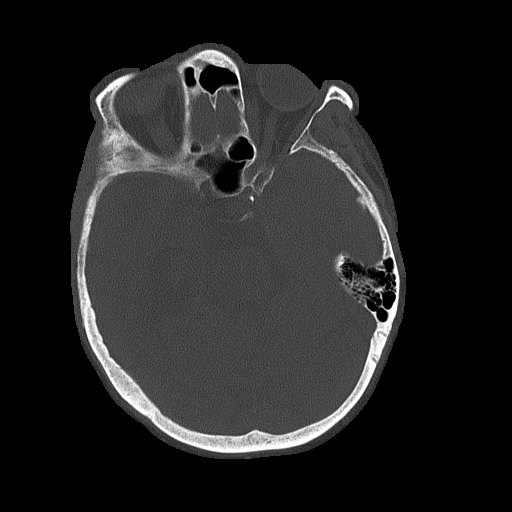
[im 32/75  bone]
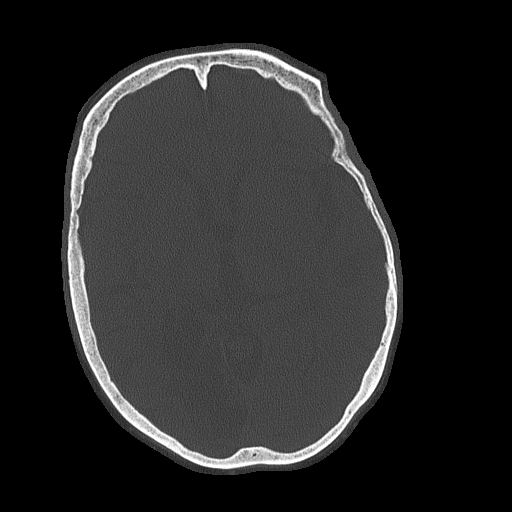

[Series 5: coronal soft tissue · coronal · 0.29mm/px · 3 of 61 slices shown]
[im 23/61  brain]
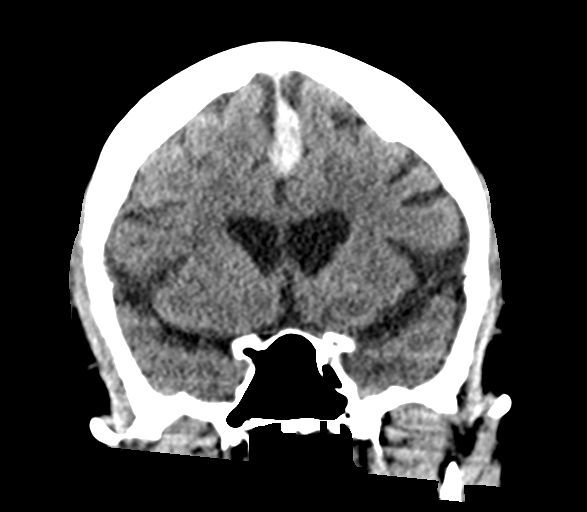
[im 31/61  brain]
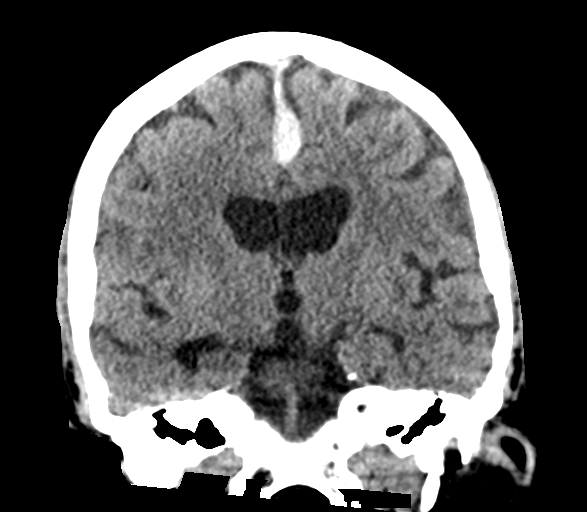
[im 38/61  brain]
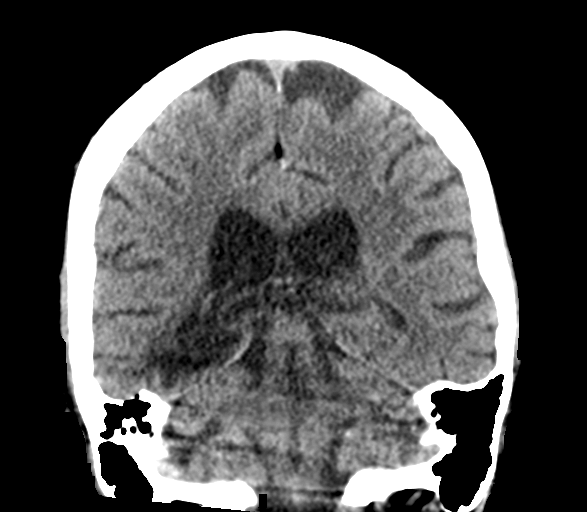

[Series 6: sagittal soft tissue · sagittal · 0.29mm/px · 1 of 52 slices shown]
[im 26/52  brain]
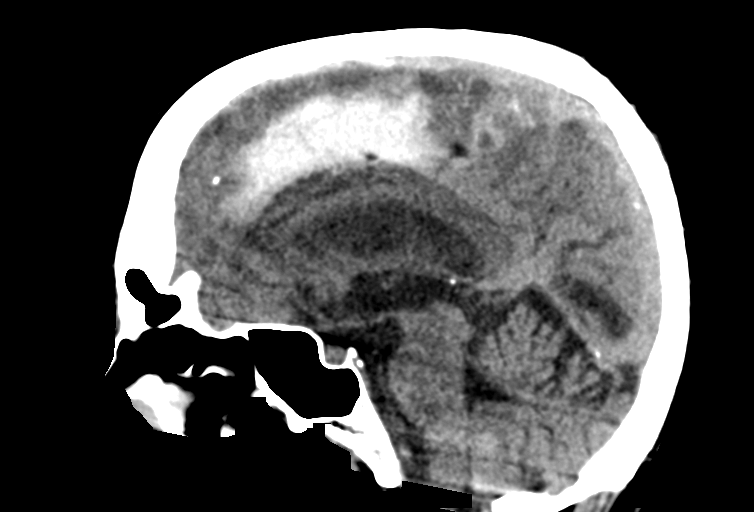

[Series 13: orthogonal bone · axial · 0.21mm/px · z∈[+48,+161]mm · 7 of 82 slices shown]
[im 11/82  bone]
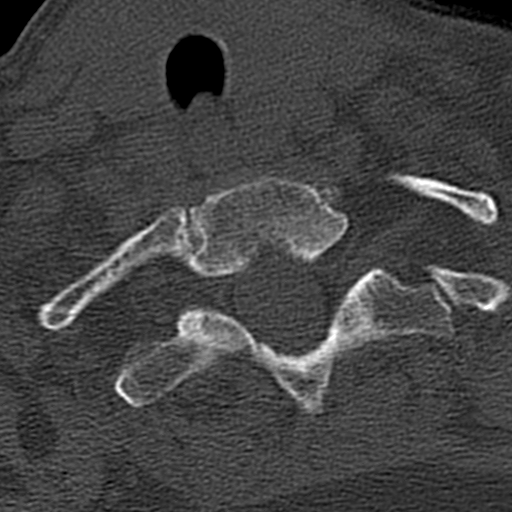
[im 21/82  bone]
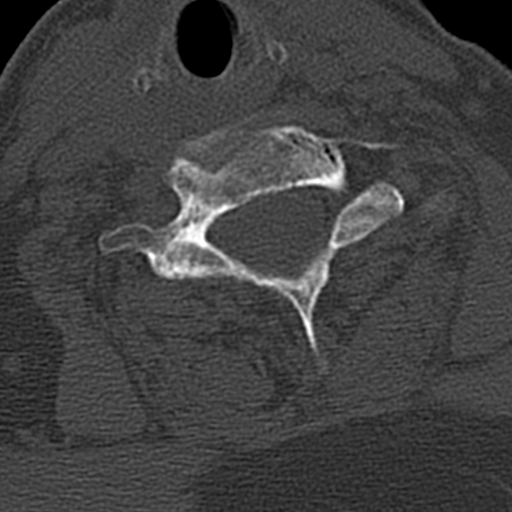
[im 31/82  bone]
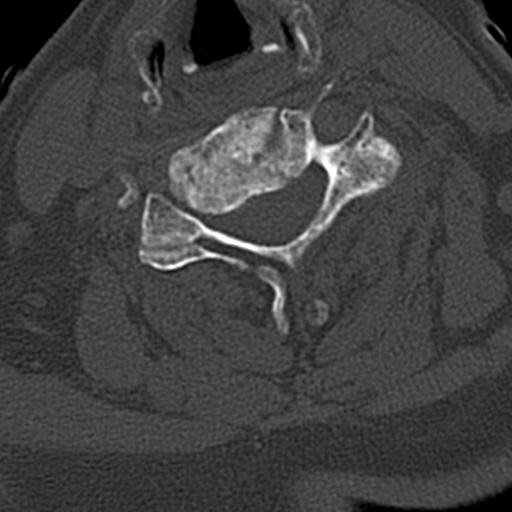
[im 41/82  bone]
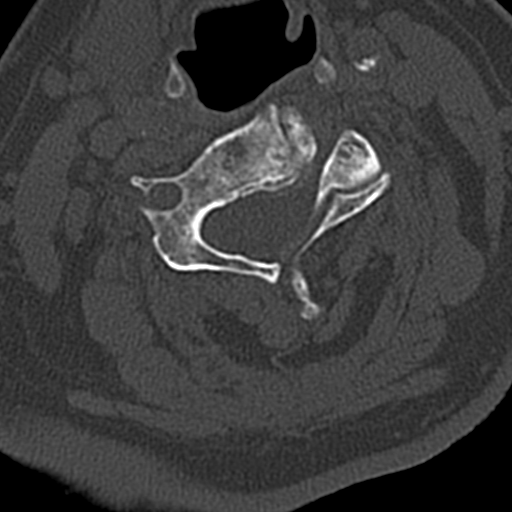
[im 51/82  bone]
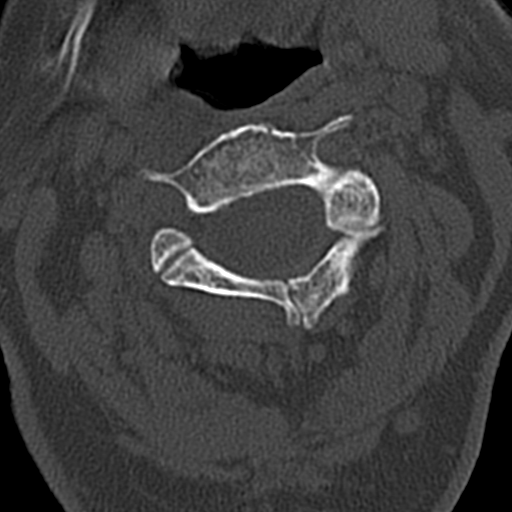
[im 61/82  bone]
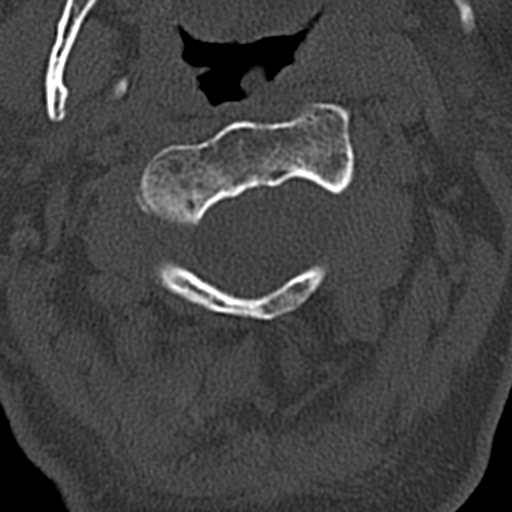
[im 71/82  bone]
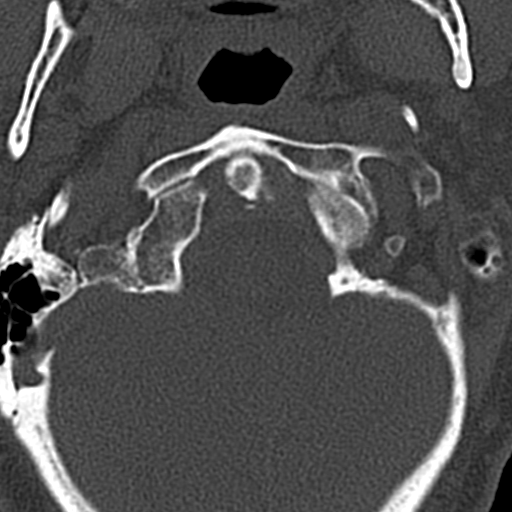

[16 of 47 positions shown; findings below may reference images not displayed]

FINDINGS: CT HEAD FINDINGS

Brain: Acute subdural hematoma tracking along the anterior superior
falx measuring approximately 11 mm. Minimal adjacent mass effect but
no midline shift. No other hemorrhage. Unchanged atrophy and chronic
small vessel ischemia. Remote right occipital and left parietal
infarcts. No hydrocephalus. The basilar cisterns are patent.

Vascular: Atherosclerosis of skullbase vasculature without
hyperdense vessel or abnormal calcification.

Skull: No fracture or focal lesion.

Sinuses/Orbits: Paranasal sinuses and mastoid air cells are clear.
The visualized orbits are unremarkable.

Other: Small right parietal scalp hematoma.

CT CERVICAL SPINE FINDINGS

Alignment: Unchanged from prior exam with straightening of lordosis
and broad-based rightward curvature. No traumatic subluxation.

Skull base and vertebrae: Bony fusion of C3-C4 vertebral bodies and
posterior elements presumed congenital. No acute fracture. The dens
and skull base are intact. Non fusion posterior arch of C2, normal
variant.

Soft tissues and spinal canal: No prevertebral fluid or swelling. No
visible canal hematoma.

Disc levels: Multilevel disc space narrowing and endplate spurring.
Multilevel facet arthropathy. Degenerative changes are stable from
prior exam.

Upper chest: Lung apices are clear.

Other: Carotid calcifications.
IMPRESSION: 1. Acute subdural hematoma along the superior falx measuring 11 mm
in thickness. Minimal mass effect, no midline shift.
2. Again seen atrophy, chronic small vessel ischemia, and remote
infarcts.
3. No acute fracture or subluxation of the cervical spine.
4. Multilevel degenerative changes are stable from prior exam.
Congenital C3-C4 fusion.

Critical Value/emergent results were called by telephone at the time
of interpretation on 07/20/2018 at [DATE] to Dr. SHOBA RYS , who
verbally acknowledged these results.

## 2019-04-18 IMAGING — CT CT HEAD W/O CM
3 series · 16 of 47 positions shown, 19 images · non-contrast
Comparison: CT from earlier in the same day.

CLINICAL DATA: Follow-up subdural hematoma

EXAM:
CT HEAD WITHOUT CONTRAST
TECHNIQUE: Contiguous axial images were obtained from the base of the skull
through the vertex without intravenous contrast.

[Series 2: head trauma wo · axial · 0.43mm/px · z∈[+30,+155]mm · 10 of 31 slices shown, 13 images]
[im 3/31  brain]
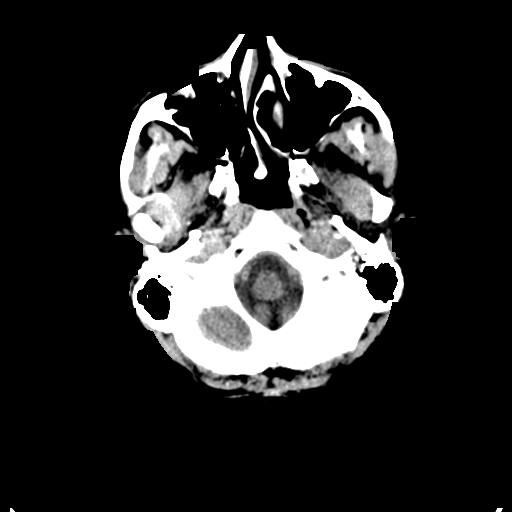
[im 3/31  bone]
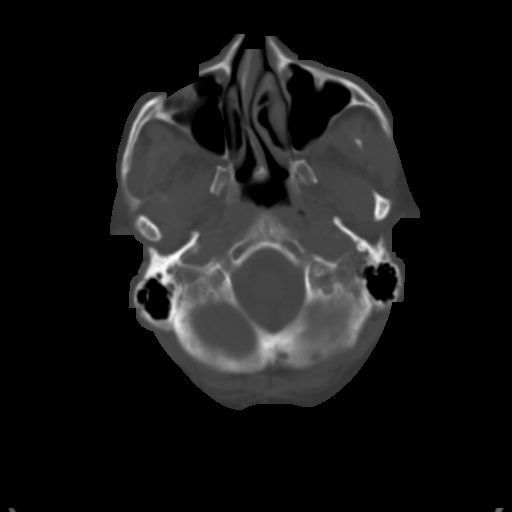
[im 6/31  brain]
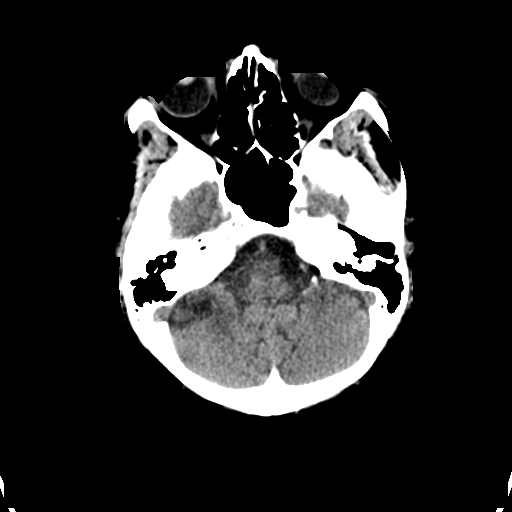
[im 9/31  brain]
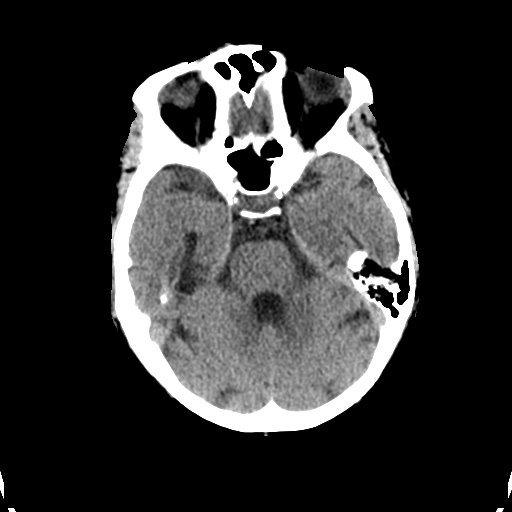
[im 11/31  brain]
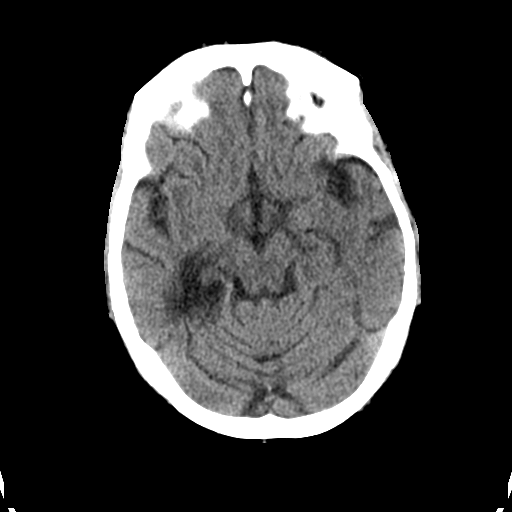
[im 14/31  brain]
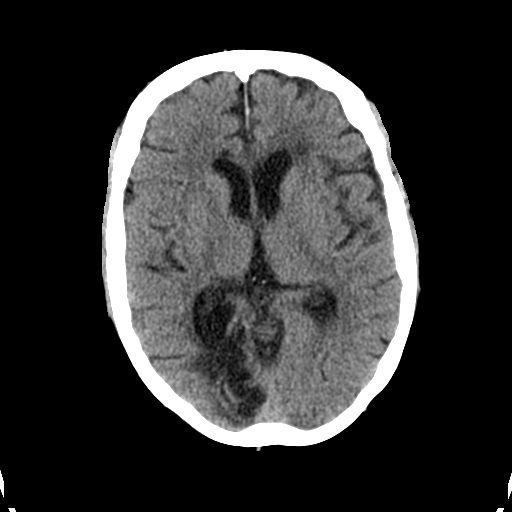
[im 14/31  bone]
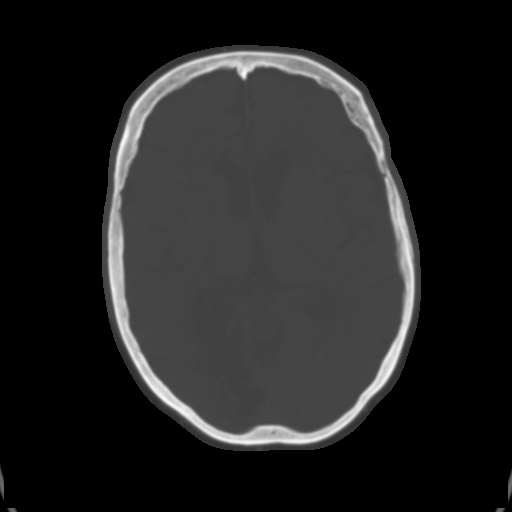
[im 17/31  brain]
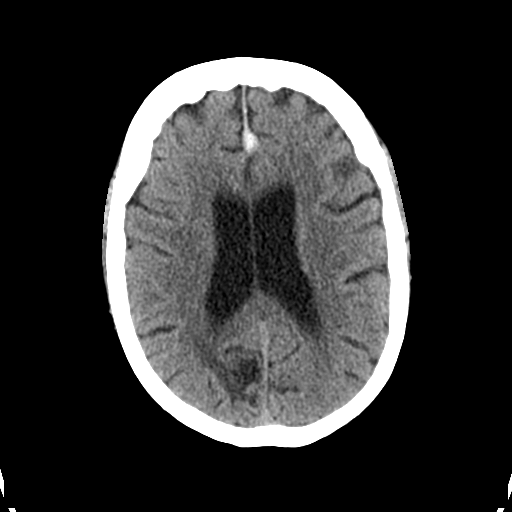
[im 20/31  brain]
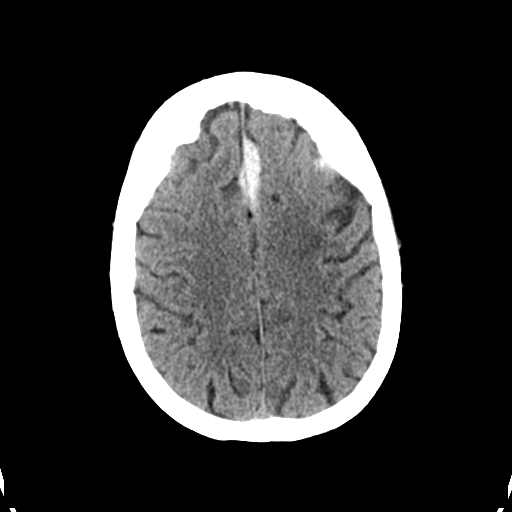
[im 23/31  brain]
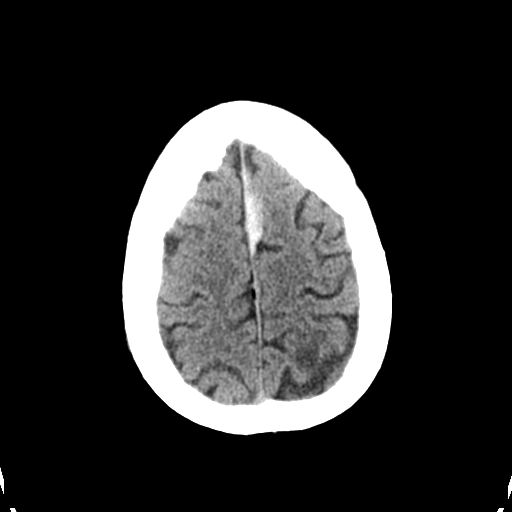
[im 25/31  brain]
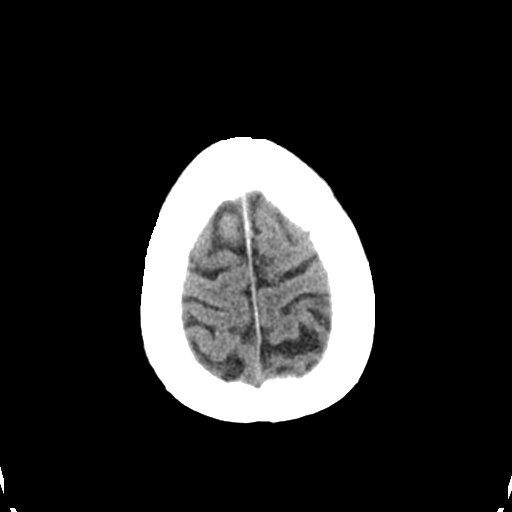
[im 25/31  bone]
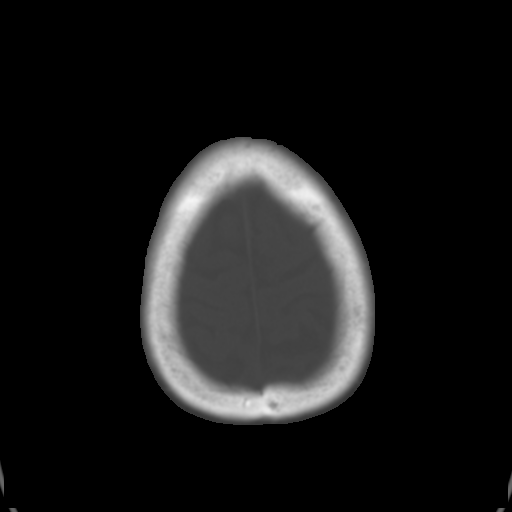
[im 28/31  brain]
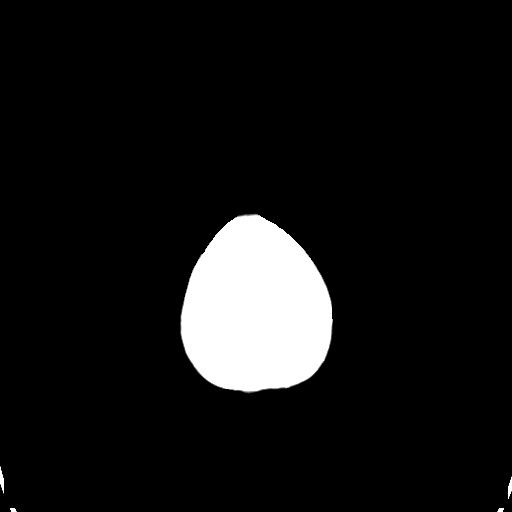

[Series 4: coronal soft tissue · coronal · 0.30mm/px · 3 of 64 slices shown]
[im 22/64  brain]
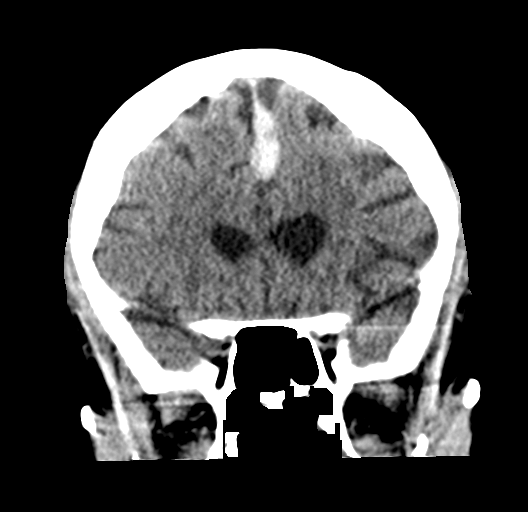
[im 29/64  brain]
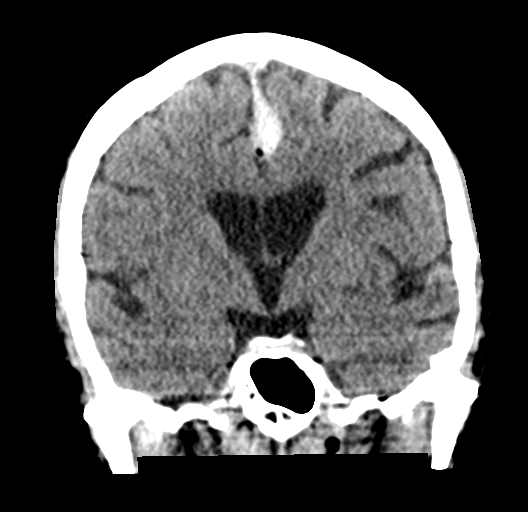
[im 36/64  brain]
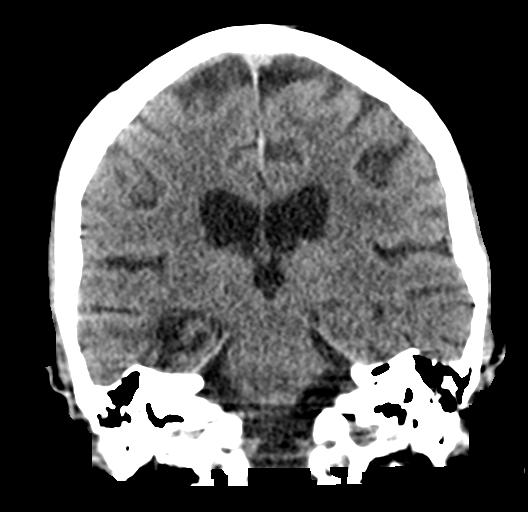

[Series 5: sagittal soft tissue · sagittal · 0.33mm/px · 3 of 52 slices shown]
[im 18/52  brain]
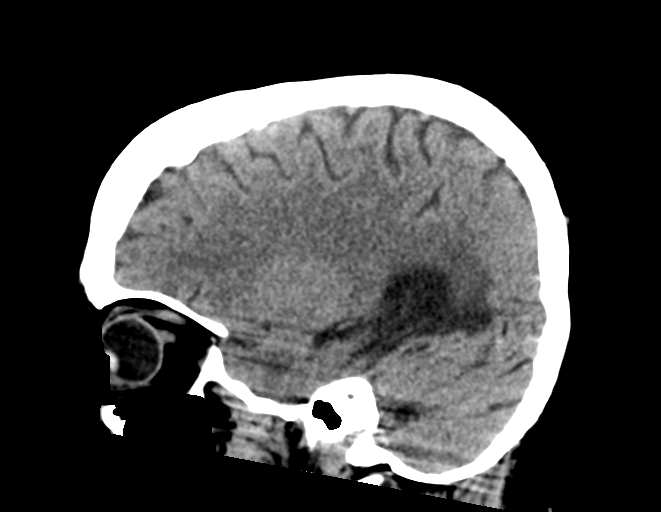
[im 26/52  brain]
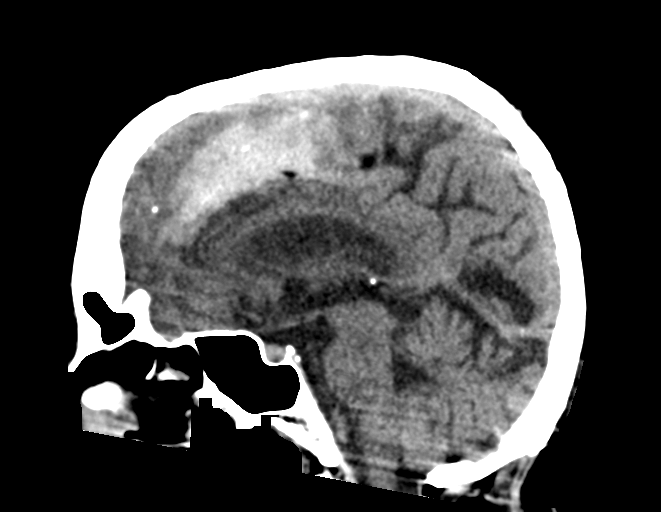
[im 35/52  brain]
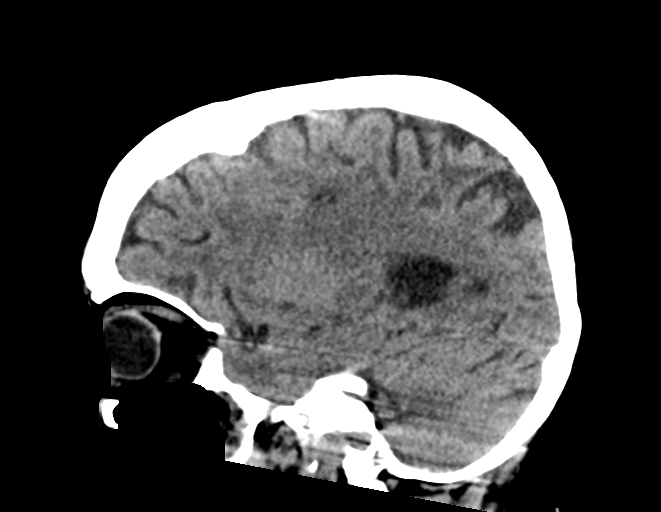

[16 of 47 positions shown; findings below may reference images not displayed]

FINDINGS: Brain: Prior infarcts in the left parietal lobe and right occipital
lobe are seen. Mild atrophic and chronic white matter ischemic
changes are noted. Previously seen falx subdural hematoma to the
left of the midline is again noted and stable. No new hemorrhage is
seen.

Vascular: No hyperdense vessel or unexpected calcification.

Skull: Normal. Negative for fracture or focal lesion.

Sinuses/Orbits: No acute finding.

Other: Small scalp hematoma on the right is again identified and
stable.
IMPRESSION: Stable subdural hematoma along the falx.

Chronic changes.  No new focal abnormality is noted.

## 2020-03-05 ENCOUNTER — Ambulatory Visit: Payer: Medicare PPO | Admitting: Cardiology

## 2020-03-12 ENCOUNTER — Ambulatory Visit: Payer: Medicare PPO | Admitting: Cardiology

## 2020-03-19 ENCOUNTER — Other Ambulatory Visit: Payer: Self-pay

## 2020-03-19 ENCOUNTER — Ambulatory Visit (INDEPENDENT_AMBULATORY_CARE_PROVIDER_SITE_OTHER): Payer: Medicare PPO | Admitting: Emergency Medicine

## 2020-03-19 ENCOUNTER — Ambulatory Visit: Payer: Medicare PPO | Admitting: Cardiology

## 2020-03-19 ENCOUNTER — Encounter: Payer: Self-pay | Admitting: Cardiology

## 2020-03-19 VITALS — BP 120/80 | HR 93 | Ht 63.0 in | Wt 154.0 lb

## 2020-03-19 DIAGNOSIS — I639 Cerebral infarction, unspecified: Secondary | ICD-10-CM

## 2020-03-19 DIAGNOSIS — I63411 Cerebral infarction due to embolism of right middle cerebral artery: Secondary | ICD-10-CM | POA: Diagnosis not present

## 2020-03-19 DIAGNOSIS — E119 Type 2 diabetes mellitus without complications: Secondary | ICD-10-CM | POA: Diagnosis not present

## 2020-03-19 DIAGNOSIS — I10 Essential (primary) hypertension: Secondary | ICD-10-CM

## 2020-03-19 DIAGNOSIS — M79605 Pain in left leg: Secondary | ICD-10-CM

## 2020-03-19 DIAGNOSIS — I2510 Atherosclerotic heart disease of native coronary artery without angina pectoris: Secondary | ICD-10-CM

## 2020-03-19 DIAGNOSIS — R200 Anesthesia of skin: Secondary | ICD-10-CM

## 2020-03-19 NOTE — Patient Instructions (Addendum)
Medication Instructions:  The current medical regimen is effective;  continue present plan and medications.  *If you need a refill on your cardiac medications before your next appointment, please call your pharmacy*  Testing/Procedures: Your physician has requested that you have a lower extremity arterial exercise duplex. During this test, exercise and ultrasound are used to evaluate arterial blood flow in the legs. Allow one hour for this exam. There are no restrictions or special instructions.  This will be scheduled at the [facility] office. Follow-Up: At [facility], you and your health needs are our priority.  As part of our continuing mission to provide you with exceptional heart care, we have created designated Provider Care Teams.  These Care Teams include your primary Cardiologist (physician) and Advanced Practice Providers (APPs -  Physician Assistants and Nurse Practitioners) who all work together to provide you with the care you need, when you need it.  We recommend signing up for the patient portal called "MyChart".  Sign up information is provided on this After Visit Summary.  MyChart is used to connect with patients for Virtual Visits (Telemedicine).  Patients are able to view lab/test results, encounter notes, upcoming appointments, etc.  Non-urgent messages can be sent to your provider as well.   To learn more about what you can do with MyChart, go to [email or web address].    Your next appointment:   12 month(s)  The format for your next appointment:   In Person  Provider:   Donato Schultz, MD   Thank you for choosing [facility]!!

## 2020-03-19 NOTE — Progress Notes (Signed)
Cardiology Office Note:    Date:  03/19/2020   ID:  Shannon [name], DOB 06-18-1951, MRN [id number]  PCP:  Shannon Hageman, DO  Cardiologist:  Shannon Schultz, MD  Electrophysiologist:  None   Referring MD: Shannon Nephew, MD     History of Present Illness:    Shannon [name] is a 69 y.o. female here for the follow-up of coronary artery disease status post DES of LAD stent in 2011, right PCA stroke in 2015 with left visual field deficit, diabetes with hypertension, IBS, recurrent stroke in 2018.  Back in 2016 had non-ST elevation myocardial infarction with troponin 1.06, cath was deferred at that time secondary to recent stroke.  Echocardiogram in 2018 showed normal EF Prior LDL 105 hemoglobin A1c 8.6.  She was at [facility] in 2018 with recurrent dizziness.  Has had increased stress anxiety  She had implantable loop recorder in place.  She has not transmitted in quite some time.  She was inquiring about possibly having it removed.  She will occasionally have some sharp chest discomfort on that region.  She has had some recent leg discomfort numbness especially in the left leg.  She is worried about her circulation.    Past Medical History:  Diagnosis Date  . Anxiety   . Arthritis   . Coronary artery disease    a. s/p PCI with DES to LAD in 2011  (LHC 03/2010: Mid LAD 70%, normal ejection fraction  >> PCI:  2.75 x 15 mm Promus DES to LAD); b. Nuclear Stress Test 05/29/14: No ischemia, EF 59%, Normal; c. NSTEMI 11/2014 >> patient declined LHC b/c of recent CVA  . Depression   . Diabetes mellitus without complication (HCC)   . Dyslipidemia 11/20/2014  . Fibromyalgia 1995  . H/O ischemic right PCA stroke 10/2014   a. Carotid US 10/24/14: No significant ICA stenosis bilaterally  . Hemiparesis and alteration of sensations as late effects of stroke (HCC) 06/05/2015  . Hx of echocardiogram    a. Echocardiogram 10/25/14: Moderate LVH, EF 50-55%, normal wall motion, grade 2 diastolic  dysfunction, mild LAE, atrial septal lipomatous hypertrophy  . Hypertension   . Irritable bowel     Past Surgical History:  Procedure Laterality Date  . CHOLECYSTECTOMY    . CORONARY STENT PLACEMENT    . LOOP RECORDER INSERTION N/A 06/23/2017   Procedure: Loop Recorder Insertion;  Surgeon: Duke Salvia, MD;  Location: [facility] INVASIVE CV LAB;  Service: Cardiovascular;  Laterality: N/A;  . orif arm fracture Right    forearm  . TEE WITHOUT CARDIOVERSION N/A 04/12/2017   Procedure: TRANSESOPHAGEAL ECHOCARDIOGRAM (TEE) WITH PROPOFOL;  Surgeon: Jonelle Sidle, MD;  Location: [facility];  Service: Cardiovascular;  Laterality: N/A;    Current Medications: Current Meds  Medication Sig  . acetaminophen (TYLENOL) 500 MG tablet Take 500 mg by mouth daily as needed for mild pain.  Marland Kitchen amLODipine (NORVASC) 10 MG tablet Take 10 mg by mouth daily.  Marland Kitchen aspirin 81 MG EC tablet Take 81 mg by mouth daily.  Marland Kitchen atorvastatin (LIPITOR) 40 MG tablet Take 1 tablet (40 mg total) by mouth daily at 6 PM.  . escitalopram (LEXAPRO) 10 MG tablet Take 1 tablet by mouth daily.  Marland Kitchen gabapentin (NEURONTIN) 400 MG capsule Take 400 mg by mouth 2 (two) times daily.  Marland Kitchen glimepiride (AMARYL) 2 MG tablet Take 2 mg by mouth daily with breakfast.  . hydrOXYzine (ATARAX/VISTARIL) 25 MG tablet Take 25-50 mg by mouth  See admin instructions. 25mg  in the morning, noon and at bedtime. Take 50mg  in the evening only  . isosorbide mononitrate (IMDUR) 30 MG 24 hr tablet Take 1 tablet (30 mg total) by mouth daily. DUE APPT IN AUGUST, MAKE APPT FOR FURTHER REFILLS, CALL [phone] TO SCHEDULE  . JANUVIA 100 MG tablet Take 100 mg by mouth daily.  levETIRAcetam (KEPPRA) 500 MG tablet Take 1 tablet (500 mg total) by mouth 2 (two) times daily.  . metoprolol tartrate (LOPRESSOR) 25 MG tablet Take 1-2 tablets by mouth See admin instructions. 1 tablet in am and 2 tablets in evening  . nitroGLYCERIN (NITROSTAT) 0.4 MG SL tablet Place 1 tablet (0.4  mg total) under the tongue every 5 (five) minutes as needed.  . nystatin cream (MYCOSTATIN) Apply 1 application topically 2 (two) times daily.  . temazepam (RESTORIL) 30 MG capsule Take 1 capsule (30 mg total) by mouth at bedtime.     Allergies:   Insulins, Sulfa antibiotics, and Lisinopril   Social History   Socioeconomic History  . Marital status: Married    Spouse name: Not on file  . Number of children: 4  . Years of education: Not on file  . Highest education level: Not on file  Occupational History  . Occupation: retired  Tobacco Use  . Smoking status: Never Smoker  . Smokeless tobacco: Never Used  Substance and Sexual Activity  . Alcohol use: No    Alcohol/week: 0.0 standard drinks  . Drug use: No  . Sexual activity: Not on file  Other Topics Concern  . Not on file  Social History Narrative   Patient is right handed.   Patient drinks about 1 coke daily.   Separated from husband. 03-29-15    Living alone but close to family.     Social Determinants of Health   Financial Resource Strain:   . Difficulty of Paying Living Expenses:   Food Insecurity:   . Worried About Marland Kitchen in the Last Year:   . 10-19-1971 in the Last Year:   Transportation Needs:   . Programme researcher, broadcasting/film/video (Medical):   Barista Lack of Transportation (Non-Medical):   Physical Activity:   . Days of Exercise per Week:   . Minutes of Exercise per Session:   Stress:   . Feeling of Stress :   Social Connections:   . Frequency of Communication with Friends and Family:   . Frequency of Social Gatherings with Friends and Family:   . Attends Religious Services:   . Active Member of Clubs or Organizations:   . Attends Freight forwarder Meetings:   Marland Kitchen Marital Status:      Family History: The patient's family history includes CAD in her brother, father, mother, and sister; Cancer in her father and mother; Diabetes in her brother, father, mother, and sister; Heart attack in her brother,  father, mother, and sister; Heart failure in her father and mother; Hypertension in her brother, father, mother, and sister; Stroke in her father and maternal aunt.  ROS:   Please see the history of present illness.    No fevers chills nausea vomiting syncope bleeding all other systems reviewed and are negative.  EKGs/Labs/Other Studies Reviewed:    The following studies were reviewed today: As above  EKG:  EKG is  ordered today.  The ekg ordered today demonstrates sinus rhythm 93 no other changes  Recent Labs: No results found for requested labs within last 8760 hours.  Recent Lipid Panel    Component Value Date/Time   CHOL 174 06/28/2018 2056   TRIG 148 06/28/2018 2056   HDL 46 06/28/2018 2056   CHOLHDL 3.8 06/28/2018 2056   VLDL 30 06/28/2018 2056   LDLCALC 98 06/28/2018 2056    Physical Exam:    VS:  BP 120/80   Pulse 93   Ht 5\' 3"  (1.6 m)   Wt 154 lb (69.9 kg)   SpO2 98%   BMI 27.28 kg/m     Wt Readings from Last 3 Encounters:  03/19/20 154 lb (69.9 kg)  07/20/18 139 lb 15.9 oz (63.5 kg)  06/28/18 140 lb (63.5 kg)     GEN:  Well nourished, well developed in no acute distress HEENT: Normal NECK: No JVD; No carotid bruits LYMPHATICS: No lymphadenopathy CARDIAC: RRR, no murmurs, rubs, gallops RESPIRATORY:  Clear to auscultation without rales, wheezing or rhonchi  ABDOMEN: Soft, non-tender, non-distended MUSCULOSKELETAL:  No edema; No deformity  SKIN: Warm and dry NEUROLOGIC:  Alert and oriented x 3 PSYCHIATRIC:  Normal affect   ASSESSMENT:    1. Cerebrovascular accident (CVA), unspecified mechanism ([place])   2. Coronary artery disease involving native coronary artery of native heart without angina pectoris   3. Diabetes mellitus with coincident hypertension ([place])   4. Numbness   5. Pain of left lower extremity    PLAN:    In order of problems listed above:  CAD -Prior LAD PCI in 2011.  Non-STEMI 2016 medically managed in the setting of stroke.  At  last consultation with Dr. Domenic Polite, consideration of resumption of beta-blocker was noted.  At this point, her blood pressure is excellent.  Hyperlipidemia -Continue with statin therapy  Recurrent stroke -TEE unremarkable.  Implantable loop recorder in place.  We will inquire about potential removal or at least see if she can once again transmit.  We went ahead and transmitted today.  Back in 2019 she had a 2-minute episode of atrial tachycardia and a possible 3-minute episode of atrial fibrillation.  No other recurrences.  Given the brief nature of the possible atrial fibrillation, we will not pursue anticoagulation.  Obviously, if a longer episode does occur, given her recurrent strokes, anticoagulation would be advisable.  Diabetes with hypertension -Somewhat labile.  At this point it is excellent.  Continue with current regimen.  Leg pain/numbness -We will check lower extremity arterial Dopplers.   Medication Adjustments/Labs and Tests Ordered: Current medicines are reviewed at length with the patient today.  Concerns regarding medicines are outlined above.  Orders Placed This Encounter  Procedures  . EKG 12-Lead  . VAS [place] LOWER EXTREMITY ARTERIAL DUPLEX   No orders of the defined types were placed in this encounter.   Patient Instructions  Medication Instructions:  The current medical regimen is effective;  continue present plan and medications.  *If you need a refill on your cardiac medications before your next appointment, please call your pharmacy*  Testing/Procedures: Your physician has requested that you have a lower extremity arterial exercise duplex. During this test, exercise and ultrasound are used to evaluate arterial blood flow in the legs. Allow one hour for this exam. There are no restrictions or special instructions.  This will be scheduled at the [facility] office. Follow-Up: At [facility], you and your health needs are our priority.  As part of our  continuing mission to provide you with exceptional heart care, we have created designated Provider Care Teams.  These Care Teams include your primary Cardiologist (  physician) and Advanced Practice Providers (APPs -  Physician Assistants and Nurse Practitioners) who all work together to provide you with the care you need, when you need it.  We recommend signing up for the patient portal called "MyChart".  Sign up information is provided on this After Visit Summary.  MyChart is used to connect with patients for Virtual Visits (Telemedicine).  Patients are able to view lab/test results, encounter notes, upcoming appointments, etc.  Non-urgent messages can be sent to your provider as well.   To learn more about what you can do with MyChart, go to [email or web address].    Your next appointment:   12 month(s)  The format for your next appointment:   In Person  Provider:   Donato Schultz, MD   Thank you for choosing [facility]!!        Signed, Shannon Schultz, MD  03/19/2020 11:05 AM    [facility]

## 2020-03-26 NOTE — Progress Notes (Signed)
  Loop check in clinic. Battery status: Good. R-waves 0.22 mV. 0 symptom episodes, 1 tachy episodes that appeared to be AT, 0 pause episodes, 0  brady episodes. 1 AF episodes (<0.1%% burden) 1 episode of AF on 10/08/18, 2019 that lasted 2 minutes with controlled v-rates. Dr Anne Fu made aware of episode of AF. Remote monitoring discontinued when patient moved. Discussed benefits of remote monitoring with patient.  Monthly summary reports and ROV prn.

## 2020-03-28 LAB — CUP PACEART REMOTE DEVICE CHECK
Date Time Interrogation Session: 20190623173712
Implantable Pulse Generator Implant Date: 20180815

## 2020-03-29 ENCOUNTER — Other Ambulatory Visit: Payer: Self-pay | Admitting: Cardiology

## 2020-03-29 DIAGNOSIS — M79605 Pain in left leg: Secondary | ICD-10-CM

## 2020-03-29 DIAGNOSIS — R200 Anesthesia of skin: Secondary | ICD-10-CM

## 2020-04-02 ENCOUNTER — Ambulatory Visit (HOSPITAL_COMMUNITY)
Admission: RE | Admit: 2020-04-02 | Payer: Medicare PPO | Source: Ambulatory Visit | Attending: Cardiology | Admitting: Cardiology

## 2020-04-10 ENCOUNTER — Telehealth: Payer: Self-pay | Admitting: Emergency Medicine

## 2020-04-10 NOTE — Telephone Encounter (Signed)
LMOM to call device clinic and # provided. Need to find out if patient found her remote monitor or if she needs a new one.

## 2020-04-12 NOTE — Telephone Encounter (Signed)
No answer no voice mail  

## 2020-04-16 ENCOUNTER — Encounter (HOSPITAL_COMMUNITY): Payer: Self-pay

## 2020-04-19 NOTE — Telephone Encounter (Signed)
Attempted to reach pt, primary # on record indicates it is invalid.  DPR on file to call pt son.  Left message for pt son, Kennedy Bucker with device clinic phone # to callback.

## 2020-04-25 NOTE — Telephone Encounter (Signed)
Fourth attempt:  Pt's phone number is out of service. LMOVM for Kennedy Bucker [facility]) requesting that pt call the DC. Direct number and office hours provided. Will try sending MyChart message.

## 2020-05-09 NOTE — Telephone Encounter (Signed)
Attempted to contact patient regarding serial #. No answer, LMOVM.

## 2020-05-10 ENCOUNTER — Encounter: Payer: Self-pay | Admitting: *Deleted

## 2020-05-10 NOTE — Telephone Encounter (Signed)
Certified letter mailed 05/10/20. Encounter closed.

## 2021-09-09 DEATH — deceased
